# Patient Record
Sex: Male | Born: 1965
Health system: Southern US, Community
[De-identification: ages and names within clinical notes are randomized; demographics above are authoritative.]

## PROBLEM LIST (undated history)

## (undated) DIAGNOSIS — J45909 Unspecified asthma, uncomplicated: Secondary | ICD-10-CM

## (undated) DIAGNOSIS — F329 Major depressive disorder, single episode, unspecified: Secondary | ICD-10-CM

## (undated) DIAGNOSIS — T7840XA Allergy, unspecified, initial encounter: Secondary | ICD-10-CM

## (undated) DIAGNOSIS — Z8619 Personal history of other infectious and parasitic diseases: Secondary | ICD-10-CM

## (undated) DIAGNOSIS — D649 Anemia, unspecified: Secondary | ICD-10-CM

## (undated) DIAGNOSIS — F32A Depression, unspecified: Secondary | ICD-10-CM

## (undated) DIAGNOSIS — F419 Anxiety disorder, unspecified: Secondary | ICD-10-CM

## (undated) DIAGNOSIS — I10 Essential (primary) hypertension: Secondary | ICD-10-CM

## (undated) DIAGNOSIS — I639 Cerebral infarction, unspecified: Secondary | ICD-10-CM

## (undated) DIAGNOSIS — B1920 Unspecified viral hepatitis C without hepatic coma: Secondary | ICD-10-CM

## (undated) HISTORY — DX: Anemia, unspecified: D64.9

## (undated) HISTORY — DX: Major depressive disorder, single episode, unspecified: F32.9

## (undated) HISTORY — DX: Allergy, unspecified, initial encounter: T78.40XA

## (undated) HISTORY — DX: Depression, unspecified: F32.A

## (undated) HISTORY — DX: Unspecified viral hepatitis C without hepatic coma: B19.20

## (undated) HISTORY — DX: Cerebral infarction, unspecified: I63.9

## (undated) HISTORY — DX: Unspecified asthma, uncomplicated: J45.909

## (undated) HISTORY — DX: Essential (primary) hypertension: I10

## (undated) HISTORY — DX: Personal history of other infectious and parasitic diseases: Z86.19

## (undated) HISTORY — DX: Anxiety disorder, unspecified: F41.9

---

## 2000-11-06 ENCOUNTER — Emergency Department (HOSPITAL_COMMUNITY): Admission: EM | Admit: 2000-11-06 | Discharge: 2000-11-06 | Payer: Self-pay | Admitting: Emergency Medicine

## 2012-12-16 ENCOUNTER — Ambulatory Visit (INDEPENDENT_AMBULATORY_CARE_PROVIDER_SITE_OTHER): Payer: BC Managed Care – PPO | Admitting: Emergency Medicine

## 2012-12-16 ENCOUNTER — Ambulatory Visit: Payer: BC Managed Care – PPO

## 2012-12-16 VITALS — BP 118/80 | HR 82 | Temp 98.2°F | Resp 18 | Ht 68.25 in | Wt 184.0 lb

## 2012-12-16 DIAGNOSIS — M79645 Pain in left finger(s): Secondary | ICD-10-CM

## 2012-12-16 DIAGNOSIS — Z202 Contact with and (suspected) exposure to infections with a predominantly sexual mode of transmission: Secondary | ICD-10-CM

## 2012-12-16 DIAGNOSIS — M79609 Pain in unspecified limb: Secondary | ICD-10-CM

## 2012-12-16 DIAGNOSIS — A599 Trichomoniasis, unspecified: Secondary | ICD-10-CM

## 2012-12-16 DIAGNOSIS — R21 Rash and other nonspecific skin eruption: Secondary | ICD-10-CM

## 2012-12-16 DIAGNOSIS — Z2089 Contact with and (suspected) exposure to other communicable diseases: Secondary | ICD-10-CM

## 2012-12-16 LAB — HIV ANTIBODY (ROUTINE TESTING W REFLEX): HIV: NONREACTIVE

## 2012-12-16 LAB — HEPATITIS C ANTIBODY: HCV Ab: REACTIVE — AB

## 2012-12-16 MED ORDER — CLOTRIMAZOLE-BETAMETHASONE 1-0.05 % EX LOTN: TOPICAL_LOTION | Freq: Two times a day (BID) | CUTANEOUS | Status: DC

## 2012-12-16 MED ORDER — METRONIDAZOLE 500 MG PO TABS: ORAL_TABLET | ORAL | Status: DC

## 2012-12-16 NOTE — Progress Notes (Signed)
  Subjective:    Patient ID: Darren Pearson, male    DOB: 01-25-1966, 47 y.o.   MRN: [id number]  HPI Patient here to renew Prilosec. It has been effective. Pt states he also wants to have his left index finger looked at. He bent it while working out at [employer] about a month ago and it has not stopped hurting since. His motion is restricted. Pt is concerned about STD. He has an itch in his testicle area, minimal discharge. He was advised of Trichomonais from one of his sexual partners.     Review of Systems     Objective:   Physical Exam examination of the index finger left hand MCP joint reveals him to have only approximately 30 of flexion. There is swelling over the MCP joint.  Examination the genital area reveals no discharge be present there is some mild thickening of the skin over the scrotum but no lesions are seen. [facility] reading (PRIMARY) by  Dr.Thu Baggett there are degenerative changes present of the MCP joint of the second finger. There is some mild irregularity of the distal portion of the second medi carpal there is a distal amputation of the third finger.       Assessment & Plan:  We'll give the patient prescription for Flagyl 500 twice a day for 7 days for both he and his partner. Routine STD screening was done. Prescription for Lotrisone cream to apply to his genital area twice a day. Referral to made to the hand specialist regarding his index finger injury .

## 2012-12-17 LAB — GC/CHLAMYDIA PROBE AMP
CT Probe RNA: NEGATIVE
GC Probe RNA: NEGATIVE

## 2012-12-17 LAB — RPR

## 2012-12-18 ENCOUNTER — Telehealth: Payer: Self-pay | Admitting: Physician Assistant

## 2012-12-18 DIAGNOSIS — R768 Other specified abnormal immunological findings in serum: Secondary | ICD-10-CM

## 2012-12-18 NOTE — Telephone Encounter (Signed)
Message copied by Morrell Riddle on Sat Dec 18, 2012  2:30 PM ------      Message from: Johnnette Litter      Created: Fri Dec 17, 2012 11:51 AM       This is a new dx for this pt. Can one of you pls call this pt when you get a chance for Dr. Cleta Alberts. Thanks so much

## 2012-12-18 NOTE — Telephone Encounter (Signed)
Called patient. LMOM to return call about lab results.

## 2012-12-18 NOTE — Telephone Encounter (Signed)
Patient is returning our phone regarding lab results.    Best#: [phone]

## 2012-12-19 NOTE — Telephone Encounter (Signed)
Spoke with patient - answered questions.will do referral.

## 2012-12-23 ENCOUNTER — Other Ambulatory Visit: Payer: Self-pay | Admitting: Emergency Medicine

## 2013-02-03 ENCOUNTER — Other Ambulatory Visit: Payer: Self-pay | Admitting: Physician Assistant

## 2013-02-03 DIAGNOSIS — B182 Chronic viral hepatitis C: Secondary | ICD-10-CM

## 2013-02-09 ENCOUNTER — Ambulatory Visit
Admission: RE | Admit: 2013-02-09 | Discharge: 2013-02-09 | Disposition: A | Discharge status: Home / Self Care | Payer: BC Managed Care – PPO | Source: Ambulatory Visit | Attending: Physician Assistant | Admitting: Physician Assistant

## 2013-02-09 DIAGNOSIS — B182 Chronic viral hepatitis C: Secondary | ICD-10-CM

## 2013-04-12 ENCOUNTER — Telehealth: Payer: Self-pay

## 2013-04-12 ENCOUNTER — Ambulatory Visit (INDEPENDENT_AMBULATORY_CARE_PROVIDER_SITE_OTHER): Payer: BC Managed Care – PPO | Admitting: Internal Medicine

## 2013-04-12 ENCOUNTER — Other Ambulatory Visit (INDEPENDENT_AMBULATORY_CARE_PROVIDER_SITE_OTHER): Payer: BC Managed Care – PPO

## 2013-04-12 ENCOUNTER — Ambulatory Visit (INDEPENDENT_AMBULATORY_CARE_PROVIDER_SITE_OTHER)
Admission: RE | Admit: 2013-04-12 | Discharge: 2013-04-12 | Disposition: A | Discharge status: Home / Self Care | Payer: BC Managed Care – PPO | Source: Ambulatory Visit | Attending: Internal Medicine | Admitting: Internal Medicine

## 2013-04-12 ENCOUNTER — Encounter: Payer: Self-pay | Admitting: Internal Medicine

## 2013-04-12 ENCOUNTER — Encounter: Payer: Self-pay | Admitting: *Deleted

## 2013-04-12 VITALS — BP 148/92 | HR 67 | Temp 98.1°F | Ht 68.0 in | Wt 169.0 lb

## 2013-04-12 DIAGNOSIS — J45909 Unspecified asthma, uncomplicated: Secondary | ICD-10-CM

## 2013-04-12 DIAGNOSIS — B1920 Unspecified viral hepatitis C without hepatic coma: Secondary | ICD-10-CM

## 2013-04-12 DIAGNOSIS — R062 Wheezing: Secondary | ICD-10-CM

## 2013-04-12 DIAGNOSIS — Z130 Encounter for screening for diseases of the blood and blood-forming organs and certain disorders involving the immune mechanism: Secondary | ICD-10-CM

## 2013-04-12 DIAGNOSIS — Z131 Encounter for screening for diabetes mellitus: Secondary | ICD-10-CM

## 2013-04-12 DIAGNOSIS — Z13220 Encounter for screening for lipoid disorders: Secondary | ICD-10-CM

## 2013-04-12 DIAGNOSIS — J309 Allergic rhinitis, unspecified: Secondary | ICD-10-CM

## 2013-04-12 DIAGNOSIS — R05 Cough: Secondary | ICD-10-CM

## 2013-04-12 DIAGNOSIS — R059 Cough, unspecified: Secondary | ICD-10-CM

## 2013-04-12 DIAGNOSIS — Z0000 Encounter for general adult medical examination without abnormal findings: Secondary | ICD-10-CM

## 2013-04-12 DIAGNOSIS — R634 Abnormal weight loss: Secondary | ICD-10-CM

## 2013-04-12 LAB — BASIC METABOLIC PANEL
BUN: 8 mg/dL (ref 6–23)
CO2: 31 mEq/L (ref 19–32)
Calcium: 8.7 mg/dL (ref 8.4–10.5)
Chloride: 103 mEq/L (ref 96–112)
Creatinine, Ser: 0.8 mg/dL (ref 0.4–1.5)
GFR: 142.93 mL/min (ref >60.00)
Glucose, Bld: 81 mg/dL (ref 70–99)
Potassium: 3.9 mEq/L (ref 3.5–5.1)
Sodium: 140 mEq/L (ref 135–145)

## 2013-04-12 LAB — CBC
HCT: 44.4 % (ref 39.0–52.0)
Hemoglobin: 15.3 g/dL (ref 13.0–17.0)
MCHC: 34.4 g/dL (ref 30.0–36.0)
MCV: 96.9 fl (ref 78.0–100.0)
Platelets: 221 10*3/uL (ref 150.0–400.0)
RBC: 4.58 Mil/uL (ref 4.22–5.81)
RDW: 13.1 % (ref 11.5–14.6)
WBC: 4.4 10*3/uL — ABNORMAL LOW (ref 4.5–10.5)

## 2013-04-12 LAB — LIPID PANEL
Cholesterol: 164 mg/dL (ref 0–200)
HDL: 60.1 mg/dL (ref >39.00)
LDL Cholesterol: 92 mg/dL (ref 0–99)
Total CHOL/HDL Ratio: 3
Triglycerides: 60 mg/dL (ref 0.0–149.0)
VLDL: 12 mg/dL (ref 0.0–40.0)

## 2013-04-12 LAB — HEPATIC FUNCTION PANEL
ALT: 38 U/L (ref 0–53)
AST: 39 U/L — ABNORMAL HIGH (ref 0–37)
Albumin: 3.8 g/dL (ref 3.5–5.2)
Alkaline Phosphatase: 72 U/L (ref 39–117)
Bilirubin, Direct: 0.2 mg/dL (ref 0.0–0.3)
Total Bilirubin: 0.9 mg/dL (ref 0.3–1.2)
Total Protein: 7.7 g/dL (ref 6.0–8.3)

## 2013-04-12 LAB — TSH: TSH: 1.18 u[IU]/mL (ref 0.35–5.50)

## 2013-04-12 LAB — HEMOGLOBIN A1C: Hgb A1c MFr Bld: 4.5 % — ABNORMAL LOW (ref 4.6–6.5)

## 2013-04-12 MED ORDER — ALBUTEROL SULFATE HFA 108 (90 BASE) MCG/ACT IN AERS: 2.0000 | INHALATION_SPRAY | Freq: Four times a day (QID) | RESPIRATORY_TRACT | Status: DC | PRN

## 2013-04-12 MED ORDER — CETIRIZINE HCL 10 MG PO TABS: 10.0000 mg | ORAL_TABLET | Freq: Every day | ORAL | Status: DC

## 2013-04-12 NOTE — Patient Instructions (Signed)
Health Maintenance, Males A healthy lifestyle and preventative care can promote health and wellness.  Maintain regular health, dental, and eye exams.  Eat a healthy diet. Foods like vegetables, fruits, whole grains, low-fat dairy products, and lean protein foods contain the nutrients you need without too many calories. Decrease your intake of foods high in solid fats, added sugars, and salt. Get information about a proper diet from your caregiver, if necessary.  Regular physical exercise is one of the most important things you can do for your health. Most adults should get at least 150 minutes of moderate-intensity exercise (any activity that increases your heart rate and causes you to sweat) each week. In addition, most adults need muscle-strengthening exercises on 2 or more days a week.   Maintain a healthy weight. The body mass index (BMI) is a screening tool to identify possible weight problems. It provides an estimate of body fat based on height and weight. Your caregiver can help determine your BMI, and can help you achieve or maintain a healthy weight. For adults 20 years and older:  A BMI below 18.5 is considered underweight.  A BMI of 18.5 to 24.9 is normal.  A BMI of 25 to 29.9 is considered overweight.  A BMI of 30 and above is considered obese.  Maintain normal blood lipids and cholesterol by exercising and minimizing your intake of saturated fat. Eat a balanced diet with plenty of fruits and vegetables. Blood tests for lipids and cholesterol should begin at age 20 and be repeated every 5 years. If your lipid or cholesterol levels are high, you are over 50, or you are a high risk for heart disease, you may need your cholesterol levels checked more frequently.Ongoing high lipid and cholesterol levels should be treated with medicines, if diet and exercise are not effective.  If you smoke, find out from your caregiver how to quit. If you do not use tobacco, do not start.  If you  choose to drink alcohol, do not exceed 2 drinks per day. One drink is considered to be 12 ounces (355 mL) of beer, 5 ounces (148 mL) of wine, or 1.5 ounces (44 mL) of liquor.  Avoid use of street drugs. Do not share needles with anyone. Ask for help if you need support or instructions about stopping the use of drugs.  High blood pressure causes heart disease and increases the risk of stroke. Blood pressure should be checked at least every 1 to 2 years. Ongoing high blood pressure should be treated with medicines if weight loss and exercise are not effective.  If you are 45 to 48 years old, ask your caregiver if you should take aspirin to prevent heart disease.  Diabetes screening involves taking a blood sample to check your fasting blood sugar level. This should be done once every 3 years, after age 45, if you are within normal weight and without risk factors for diabetes. Testing should be considered at a younger age or be carried out more frequently if you are overweight and have at least 1 risk factor for diabetes.  Colorectal cancer can be detected and often prevented. Most routine colorectal cancer screening begins at the age of 50 and continues through age 75. However, your caregiver may recommend screening at an earlier age if you have risk factors for colon cancer. On a yearly basis, your caregiver may provide home test kits to check for hidden blood in the stool. Use of a small camera at the end of a tube,   to directly examine the colon (sigmoidoscopy or colonoscopy), can detect the earliest forms of colorectal cancer. Talk to your caregiver about this at age 50, when routine screening begins. Direct examination of the colon should be repeated every 5 to 10 years through age 75, unless early forms of pre-cancerous polyps or small growths are found.  Hepatitis C blood testing is recommended for all people born from 1945 through 1965 and any individual with known risks for hepatitis C.  Healthy  men should no longer receive prostate-specific antigen (PSA) blood tests as part of routine cancer screening. Consult with your caregiver about prostate cancer screening.  Testicular cancer screening is not recommended for adolescents or adult males who have no symptoms. Screening includes self-exam, caregiver exam, and other screening tests. Consult with your caregiver about any symptoms you have or any concerns you have about testicular cancer.  Practice safe sex. Use condoms and avoid high-risk sexual practices to reduce the spread of sexually transmitted infections (STIs).  Use sunscreen with a sun protection factor (SPF) of 30 or greater. Apply sunscreen liberally and repeatedly throughout the day. You should seek shade when your shadow is shorter than you. Protect yourself by wearing long sleeves, pants, a wide-brimmed hat, and sunglasses year round, whenever you are outdoors.  Notify your caregiver of new moles or changes in moles, especially if there is a change in shape or color. Also notify your caregiver if a mole is larger than the size of a pencil eraser.  A one-time screening for abdominal aortic aneurysm (AAA) and surgical repair of large AAAs by sound wave imaging (ultrasonography) is recommended for ages 65 to 75 years who are current or former smokers.  Stay current with your immunizations. Document Released: 05/08/2008 Document Revised: 02/02/2012 Document Reviewed: 04/07/2011 Exit[employer] Patient Information 2013 [employer].  

## 2013-04-12 NOTE — Progress Notes (Signed)
HPI  Pt presents to the clinic today to establish care. He does not have a PCP. He has been going to urgent care or ED as needed. He was recently diagnosed with Hep C from remote cocaine use. He is being evaluated at the liver clinic. He also recently had a bout of acute bronchitis. He feels like his symptoms are only slightly improved. He is still coughing of green mucous and wheezing. He does have a history of allergy and asthma. He is not currently on any antihistamine treatment. He does smoke THC on a daily basis. He does not smoke cigarettes. He has had sick contacts. His brother is currently hospitalized with pneumonia.  Flu:08/2012 Tetanus: 1997 Eye doctor: yearly Dentist: never  Past Medical History  Diagnosis Date  . Asthma   . Allergy   . Hypertension   . Hepatitis C   . History of chicken pox     Current Outpatient Prescriptions  Medication Sig Dispense Refill  . Cholecalciferol (VITAMIN D PO) Take 1 tablet by mouth daily.      Marland Kitchen omeprazole (PRILOSEC) 20 MG capsule Take 20 mg by mouth daily.       No current facility-administered medications for this visit.    Allergies  Allergen Reactions  . Sulfa Antibiotics Hives    Family History  Problem Relation Age of Onset  . Cancer Mother   . Diabetes Father   . Paget's disease of bone Father     History   Social History  . Marital Status: Married    Spouse Name: N/A    Number of Children: N/A  . Years of Education: 12   Occupational History  . [employer] lead    Social History Main Topics  . Smoking status: Never Smoker   . Smokeless tobacco: Not on file  . Alcohol Use: No  . Drug Use: 1.00 per week    Special: Cocaine  . Sexually Active: Yes    Birth Control/ Protection: Condom   Other Topics Concern  . Not on file   Social History Narrative   Regular exercise-yes    ROS:  Constitutional: Pt has lost 15 pounds in the last 15 months. Denies fever, malaise, fatigue, headache. HEENT: Pt  reports PND. Denies eye pain, eye redness, ear pain, ringing in the ears, wax buildup, runny nose, nasal congestion, bloody nose, or sore throat. Respiratory: Pt reports cough and wheezing. Denies difficulty breathing, shortness of breath.   Cardiovascular: Denies chest pain, chest tightness, palpitations or swelling in the hands or feet.  Gastrointestinal: Denies abdominal pain, bloating, constipation, diarrhea or blood in the stool.  GU: Denies frequency, urgency, pain with urination, blood in urine, odor or discharge. Musculoskeletal: Denies decrease in range of motion, difficulty with gait, muscle pain or joint pain and swelling.  Skin: Denies redness, rashes, lesions or ulcercations.  Neurological: Denies dizziness, difficulty with memory, difficulty with speech or problems with balance and coordination.   No other specific complaints in a complete review of systems (except as listed in HPI above).  PE:  BP 148/92  Pulse 67  Temp(Src) 98.1 F (36.7 C) (Oral)  Ht 5\' 8"  (1.727 m)  Wt 169 lb (76.658 kg)  BMI 25.7 kg/m2  SpO2 96% Wt Readings from Last 3 Encounters:  04/12/13 169 lb (76.658 kg)  12/16/12 184 lb (83.462 kg)    General: Appears his stated age, well developed, well nourished in NAD. HEENT: Head: normal shape and size; Eyes: sclera white, no icterus, conjunctiva  pink, PERRLA and EOMs intact; Ears: Tm's gray and intact, normal light reflex; Nose: mucosa boggy and moist, septum midline; Throat/Mouth: Teeth present, mucosa pink and moist, no lesions or ulcerations noted.  Neck: Normal range of motion. Neck supple, trachea midline. No massses, lumps or thyromegaly present.  Cardiovascular: Normal rate and rhythm. S1,S2 noted.  No murmur, rubs or gallops noted. No JVD or BLE edema. No carotid bruits noted. Pulmonary/Chest: Normal effort and bilateral wheezing noted. No respiratory distress. No wheezes, rales or ronchi noted.  Abdomen: Soft and nontender. Normal bowel sounds, no  bruits noted. No distention or masses noted. Liver, spleen and kidneys non palpable. Musculoskeletal: Normal range of motion. No signs of joint swelling. No difficulty with gait. Loss of pinky and ring finger on left hand secondary to accident. Neurological: Alert and oriented. Cranial nerves II-XII intact. Coordination normal. +DTRs bilaterally. Psychiatric: Mood and affect normal. Behavior is normal. Judgment and thought content normal.     Assessment and Plan:  Preventative Health Maintenance:  Encouraged pt to visit a dentist Tetanus booster declined today Encouraged pt to quit smoking THC, pt declines

## 2013-04-12 NOTE — Assessment & Plan Note (Signed)
eRx for zyrtec daily

## 2013-04-12 NOTE — Assessment & Plan Note (Signed)
Will check lft today Continue to follow with liver clinic Avoid alcohol

## 2013-04-12 NOTE — Telephone Encounter (Signed)
Phone call from pt requesting his chest xray results. I called him back and left message for him to return call.

## 2013-04-12 NOTE — Assessment & Plan Note (Signed)
Will obtain chest xray today to r/o pna Refilled albuterol

## 2013-04-13 ENCOUNTER — Telehealth: Payer: Self-pay

## 2013-04-13 NOTE — Telephone Encounter (Signed)
Phone call from patient again and gave him the results of his chest xray

## 2013-04-13 NOTE — Telephone Encounter (Signed)
Patient calls back and states he wants the results of his xray and a message can be left if he does not answer. I called pt back and left him a message with his chest xray results and he can call back if any questions.

## 2013-04-15 ENCOUNTER — Encounter: Payer: Self-pay | Admitting: *Deleted

## 2013-07-18 ENCOUNTER — Encounter: Payer: Self-pay | Admitting: Physician Assistant

## 2013-09-29 ENCOUNTER — Other Ambulatory Visit: Payer: Self-pay

## 2013-11-30 ENCOUNTER — Other Ambulatory Visit: Payer: Self-pay | Admitting: Internal Medicine

## 2013-11-30 DIAGNOSIS — C220 Liver cell carcinoma: Secondary | ICD-10-CM

## 2013-12-05 ENCOUNTER — Ambulatory Visit
Admission: RE | Admit: 2013-12-05 | Discharge: 2013-12-05 | Disposition: A | Discharge status: Home / Self Care | Payer: BC Managed Care – PPO | Source: Ambulatory Visit | Attending: Internal Medicine | Admitting: Internal Medicine

## 2013-12-05 DIAGNOSIS — C220 Liver cell carcinoma: Secondary | ICD-10-CM

## 2014-05-30 ENCOUNTER — Ambulatory Visit (INDEPENDENT_AMBULATORY_CARE_PROVIDER_SITE_OTHER): Payer: BC Managed Care – PPO | Admitting: Emergency Medicine

## 2014-05-30 ENCOUNTER — Ambulatory Visit: Payer: BC Managed Care – PPO

## 2014-05-30 VITALS — BP 154/82 | HR 67 | Temp 98.1°F | Resp 18 | Ht 67.75 in | Wt 142.0 lb

## 2014-05-30 DIAGNOSIS — R768 Other specified abnormal immunological findings in serum: Secondary | ICD-10-CM

## 2014-05-30 DIAGNOSIS — R894 Abnormal immunological findings in specimens from other organs, systems and tissues: Secondary | ICD-10-CM

## 2014-05-30 DIAGNOSIS — R0602 Shortness of breath: Secondary | ICD-10-CM

## 2014-05-30 DIAGNOSIS — R634 Abnormal weight loss: Secondary | ICD-10-CM

## 2014-05-30 LAB — POCT URINALYSIS DIPSTICK
Bilirubin, UA: NEGATIVE
Blood, UA: NEGATIVE
Glucose, UA: NEGATIVE
Leukocytes, UA: NEGATIVE
Nitrite, UA: NEGATIVE
Protein, UA: 30
Spec Grav, UA: 1.015
Urobilinogen, UA: >=8
pH, UA: 7

## 2014-05-30 LAB — POCT UA - MICROSCOPIC ONLY
Bacteria, U Microscopic: NEGATIVE
Casts, Ur, LPF, POC: NEGATIVE
Crystals, Ur, HPF, POC: NEGATIVE
Mucus, UA: NEGATIVE
RBC, urine, microscopic: NEGATIVE
WBC, Ur, HPF, POC: NEGATIVE
Yeast, UA: NEGATIVE

## 2014-05-30 LAB — POCT SEDIMENTATION RATE: POCT SED RATE: 28 mm/hr — AB (ref 0–22)

## 2014-05-30 LAB — GLUCOSE, POCT (MANUAL RESULT ENTRY): POC Glucose: 85 mg/dL (ref 70–99)

## 2014-05-30 NOTE — Progress Notes (Signed)
  Tuberculosis Risk Questionnaire  1. No Were you born outside the [place] in one of the following parts of the world: [place], [place], [place], [place] or [place]?    2. No Have you traveled outside the [place] and lived for more than one month in one of the following parts of the world: [place], [place], [place], [place] or [place]?    3. No Do you have a compromised immune system such as from any of the following conditions:HIV/AIDS, organ or bone marrow transplantation, diabetes, immunosuppressive medicines (e.g. Prednisone, Remicaide), leukemia, lymphoma, cancer of the head or neck, gastrectomy or jejunal bypass, end-stage renal disease (on dialysis), or silicosis?     4. No Have you ever or do you plan on working in: a residential care center, a health care facility, a jail or prison or homeless shelter?    5. No Have you ever: injected illegal drugs, used crack cocaine, lived in a homeless shelter  or been in jail or prison?     6. No Have you ever been exposed to anyone with infectious tuberculosis?    Tuberculosis Symptom Questionnaire  Do you currently have any of the following symptoms?  1. No Unexplained cough lasting more than 3 weeks?   2. No Unexplained fever lasting more than 3 weeks.   3. No Night Sweats (sweating that leaves the bedclothes and sheets wet)     4. No Shortness of Breath   5. No Chest Pain   6. Yes  Unintentional weight loss    7. No Unexplained fatigue (very tired for no reason)

## 2014-05-30 NOTE — Progress Notes (Addendum)
[facility] [address] [place] [address] [phone]  Date:  05/30/2014   Name:  Darren Pearson   DOB:  09-Aug-1966   MRN:  [id number]  PCP:  Webb Silversmith, NP    Chief Complaint: Shortness of Breath and Excessive Sweating   History of Present Illness:  Darren Pearson is a 48 y.o. very pleasant male patient who presents with the following:  Patient has a number of unrelated complaints. He says that he has been losing weight with over 30 pounds lost in 18 months.   Says his appetite is normal and denies nausea or vomiting. No stool change, blood in stools, black stools or hematemesis.  Denies hemoptysis.  No fever or chills.  History of Hep C.  No icterus.   Has been short of breath per patient.  No cough, wheezing.  Says shortness of breath is with exetrion.  No PDN or orthopnea Gives a history of increased sweating past few days while working.  Says he usually sweats at work when it is hot and this sweating was extreme.  No improvement with over the counter medications or other home remedies. Denies other complaint or health concern today.   Patient Active Problem List   Diagnosis Date Noted  . Hepatitis C 04/12/2013  . Allergic rhinitis 04/12/2013  . Unspecified asthma(493.90) 04/12/2013    Past Medical History  Diagnosis Date  . Asthma   . Allergy   . Hypertension   . Hepatitis C   . History of chicken pox     History reviewed. No pertinent past surgical history.  History  Substance Use Topics  . Smoking status: Current Some Day Smoker    Start date: 11/24/2012  . Smokeless tobacco: Not on file  . Alcohol Use: 1.2 oz/week    2 Cans of beer per week    Family History  Problem Relation Age of Onset  . Cancer Mother   . Diabetes Father   . Paget's disease of bone Father     Allergies  Allergen Reactions  . Sulfa Antibiotics Hives    Medication list has been reviewed and updated.  No current outpatient prescriptions on file  prior to visit.   No current facility-administered medications on file prior to visit.    Review of Systems:  As per HPI, otherwise negative.    Physical Examination: Filed Vitals:   05/30/14 1416  BP: 154/82  Pulse: 67  Temp: 98.1 F (36.7 C)  Resp: 18   Filed Vitals:   05/30/14 1416  Height: 5' 7.75" (1.721 m)  Weight: 142 lb (64.411 kg)   Body mass index is 21.75 kg/(m^2). Ideal Body Weight: Weight in (lb) to have BMI = 25: 162.9  GEN: WDWN, NAD, Non-toxic, A & O x 3 HEENT: Atraumatic, Normocephalic. Neck supple. No masses, No LAD. Ears and Nose: No external deformity. CV: RRR, No M/G/R. No JVD. No thrill. No extra heart sounds. PULM: CTA B, no wheezes, crackles, rhonchi. No retractions. No resp. distress. No accessory muscle use. ABD: S, NT, ND, +BS. No rebound. Liver is 3 fingers. EXTR: No c/c/e NEURO Normal gait.  PSYCH: Normally interactive. Conversant. Not depressed or anxious appearing.  Calm demeanor.    Assessment and Plan: Weight loss "sweating" Hepatitis C Labs pending   Signed,  Ellison Carwin, MD   [facility] reading (PRIMARY) by  Dr. Ouida Sills chest unchanged .  Results for orders placed in visit on 05/30/14  POCT UA - MICROSCOPIC  ONLY      Result Value Ref Range   WBC, Ur, HPF, POC neg     RBC, urine, microscopic neg     Bacteria, U Microscopic neg     Mucus, UA neg     Epithelial cells, urine per micros 0-2     Crystals, Ur, HPF, POC neg     Casts, Ur, LPF, POC neg     Yeast, UA neg    POCT URINALYSIS DIPSTICK      Result Value Ref Range   Color, UA yellow     Clarity, UA cloudy     Glucose, UA neg     Bilirubin, UA neg     Ketones, UA trace     Spec Grav, UA 1.015     Blood, UA neg     pH, UA 7.0     Protein, UA 30     Urobilinogen, UA >=8.0     Nitrite, UA neg     Leukocytes, UA Negative    GLUCOSE, POCT (MANUAL RESULT ENTRY)      Result Value Ref Range   POC Glucose 85  70 - 99 mg/dl   Results for orders placed in visit  on 05/30/14  CBC WITH DIFFERENTIAL      Result Value Ref Range   WBC 4.8  4.0 - 10.5 K/uL   RBC 4.72  4.22 - 5.81 MIL/uL   Hemoglobin 15.7  13.0 - 17.0 g/dL   HCT 44.8  39.0 - 52.0 %   MCV 94.9  78.0 - 100.0 fL   MCH 33.3  26.0 - 34.0 pg   MCHC 35.0  30.0 - 36.0 g/dL   RDW 12.9  11.5 - 15.5 %   Platelets 225  150 - 400 K/uL   Neutrophils Relative % 45  43 - 77 %   Neutro Abs 2.2  1.7 - 7.7 K/uL   Lymphocytes Relative 44  12 - 46 %   Lymphs Abs 2.1  0.7 - 4.0 K/uL   Monocytes Relative 8  3 - 12 %   Monocytes Absolute 0.4  0.1 - 1.0 K/uL   Eosinophils Relative 3  0 - 5 %   Eosinophils Absolute 0.1  0.0 - 0.7 K/uL   Basophils Relative 0  0 - 1 %   Basophils Absolute 0.0  0.0 - 0.1 K/uL   Smear Review Criteria for review not met    COMPREHENSIVE METABOLIC PANEL      Result Value Ref Range   Sodium 142  135 - 145 mEq/L   Potassium 3.2 (*) 3.5 - 5.3 mEq/L   Chloride 103  96 - 112 mEq/L   CO2 29  19 - 32 mEq/L   Glucose, Bld 73  70 - 99 mg/dL   BUN 9  6 - 23 mg/dL   Creat 0.91  0.50 - 1.35 mg/dL   Total Bilirubin 1.5 (*) 0.2 - 1.2 mg/dL   Alkaline Phosphatase 76  39 - 117 U/L   AST 38 (*) 0 - 37 U/L   ALT 31  0 - 53 U/L   Total Protein 7.3  6.0 - 8.3 g/dL   Albumin 3.9  3.5 - 5.2 g/dL   Calcium 9.0  8.4 - 10.5 mg/dL  TSH      Result Value Ref Range   TSH 0.712  0.350 - 4.500 uIU/mL  POCT SEDIMENTATION RATE      Result Value Ref Range   POCT SED RATE 28 (*)  0 - 22 mm/hr  POCT UA - MICROSCOPIC ONLY      Result Value Ref Range   WBC, Ur, HPF, POC neg     RBC, urine, microscopic neg     Bacteria, U Microscopic neg     Mucus, UA neg     Epithelial cells, urine per micros 0-2     Crystals, Ur, HPF, POC neg     Casts, Ur, LPF, POC neg     Yeast, UA neg    POCT URINALYSIS DIPSTICK      Result Value Ref Range   Color, UA yellow     Clarity, UA cloudy     Glucose, UA neg     Bilirubin, UA neg     Ketones, UA trace     Spec Grav, UA 1.015     Blood, UA neg     pH, UA 7.0      Protein, UA 30     Urobilinogen, UA >=8.0     Nitrite, UA neg     Leukocytes, UA Negative    GLUCOSE, POCT (MANUAL RESULT ENTRY)      Result Value Ref Range   POC Glucose 85  70 - 99 mg/dl

## 2014-05-31 LAB — COMPREHENSIVE METABOLIC PANEL
ALT: 31 U/L (ref 0–53)
AST: 38 U/L — ABNORMAL HIGH (ref 0–37)
Albumin: 3.9 g/dL (ref 3.5–5.2)
Alkaline Phosphatase: 76 U/L (ref 39–117)
BUN: 9 mg/dL (ref 6–23)
CO2: 29 mEq/L (ref 19–32)
Calcium: 9 mg/dL (ref 8.4–10.5)
Chloride: 103 mEq/L (ref 96–112)
Creat: 0.91 mg/dL (ref 0.50–1.35)
Glucose, Bld: 73 mg/dL (ref 70–99)
Potassium: 3.2 mEq/L — ABNORMAL LOW (ref 3.5–5.3)
Sodium: 142 mEq/L (ref 135–145)
Total Bilirubin: 1.5 mg/dL — ABNORMAL HIGH (ref 0.2–1.2)
Total Protein: 7.3 g/dL (ref 6.0–8.3)

## 2014-05-31 LAB — TSH: TSH: 0.712 u[IU]/mL (ref 0.350–4.500)

## 2014-05-31 LAB — CBC WITH DIFFERENTIAL/PLATELET
Basophils Absolute: 0 10*3/uL (ref 0.0–0.1)
Basophils Relative: 0 % (ref 0–1)
Eosinophils Absolute: 0.1 10*3/uL (ref 0.0–0.7)
Eosinophils Relative: 3 % (ref 0–5)
HCT: 44.8 % (ref 39.0–52.0)
Hemoglobin: 15.7 g/dL (ref 13.0–17.0)
Lymphocytes Relative: 44 % (ref 12–46)
Lymphs Abs: 2.1 10*3/uL (ref 0.7–4.0)
MCH: 33.3 pg (ref 26.0–34.0)
MCHC: 35 g/dL (ref 30.0–36.0)
MCV: 94.9 fL (ref 78.0–100.0)
Monocytes Absolute: 0.4 10*3/uL (ref 0.1–1.0)
Monocytes Relative: 8 % (ref 3–12)
Neutro Abs: 2.2 10*3/uL (ref 1.7–7.7)
Neutrophils Relative %: 45 % (ref 43–77)
Platelets: 225 10*3/uL (ref 150–400)
RBC: 4.72 MIL/uL (ref 4.22–5.81)
RDW: 12.9 % (ref 11.5–15.5)
WBC: 4.8 10*3/uL (ref 4.0–10.5)

## 2014-05-31 MED ORDER — POTASSIUM CHLORIDE ER 8 MEQ PO TBCR: 8.0000 meq | EXTENDED_RELEASE_TABLET | Freq: Every day | ORAL | Status: DC

## 2014-05-31 NOTE — Addendum Note (Signed)
Addended by: Roselee Culver on: 05/31/2014 10:18 AM   Modules accepted: Orders

## 2014-05-31 NOTE — Addendum Note (Signed)
Addended by: Roselee Culver on: 05/31/2014 10:16 AM   Modules accepted: Orders

## 2014-06-01 ENCOUNTER — Encounter: Payer: Self-pay | Admitting: Internal Medicine

## 2014-06-01 ENCOUNTER — Ambulatory Visit: Payer: BC Managed Care – PPO | Admitting: Internal Medicine

## 2014-06-02 ENCOUNTER — Encounter: Payer: BC Managed Care – PPO | Admitting: *Deleted

## 2014-06-02 LAB — TB SKIN TEST
Induration: 0 mm
TB Skin Test: NEGATIVE

## 2014-06-05 ENCOUNTER — Ambulatory Visit: Payer: BC Managed Care – PPO | Admitting: Internal Medicine

## 2014-06-05 ENCOUNTER — Ambulatory Visit: Payer: BC Managed Care – PPO | Admitting: Gastroenterology

## 2014-06-05 ENCOUNTER — Ambulatory Visit (INDEPENDENT_AMBULATORY_CARE_PROVIDER_SITE_OTHER): Payer: BC Managed Care – PPO | Admitting: Internal Medicine

## 2014-06-05 ENCOUNTER — Encounter: Payer: Self-pay | Admitting: Internal Medicine

## 2014-06-05 ENCOUNTER — Telehealth: Payer: Self-pay | Admitting: Gastroenterology

## 2014-06-05 VITALS — BP 134/70 | HR 91 | Temp 98.6°F | Wt 141.0 lb

## 2014-06-05 DIAGNOSIS — Z13220 Encounter for screening for lipoid disorders: Secondary | ICD-10-CM

## 2014-06-05 DIAGNOSIS — Z125 Encounter for screening for malignant neoplasm of prostate: Secondary | ICD-10-CM

## 2014-06-05 DIAGNOSIS — R634 Abnormal weight loss: Secondary | ICD-10-CM

## 2014-06-05 MED ORDER — MIRTAZAPINE 15 MG PO TABS: 15.0000 mg | ORAL_TABLET | Freq: Every day | ORAL | Status: DC

## 2014-06-05 NOTE — Progress Notes (Signed)
Subjective:    Patient ID: Darren Pearson, male    DOB: 1966-04-15, 48 y.o.   MRN: [id number]  HPI  Pt presents to the clinic today with c/o weight loss. He reports a 73 lbs weight loss in the last year. He was seen by UC 7/7 for the same. He had no change in appetite or bowels. He did c/o some SOB with exertion and excessive sweating. Labs showed low potassium, slightly elevated liver enzymes and elevated sed rate. He was advised at that time to take a potassium supplement and recheck labs in 1 month. PPD and HIV were also negative. Chest xray was normal. He was supposed to see GI 06/01/14 for EGD and Colonoscopy- he no showed. He also no showed for an appt with me 06/01/14. He has followed up with the Hep C clinic. They do not feel that his weight loss is due to Hep C at this time. He does report to me that he is very stressed out. He works from 5 pm to 2 am. He wakes up about 10 am, eats [place] bacon and 2 waffles. He will eat again about 8 pm. After eating his second meal of the day, he will not eat again until the next morning. He has recently cut back on his smoking. He does still smoke THC multiple times per week. He reports this does not increase his appetite at all. He has not started any new recreational drugs.   Review of Systems      Past Medical History  Diagnosis Date  . Asthma   . Allergy   . Hypertension   . Hepatitis C   . History of chicken pox     Current Outpatient Prescriptions  Medication Sig Dispense Refill  . potassium chloride (KLOR-CON) 8 MEQ tablet Take 1 tablet (8 mEq total) by mouth daily.  30 tablet  2   No current facility-administered medications for this visit.    Allergies  Allergen Reactions  . Sulfa Antibiotics Hives    Family History  Problem Relation Age of Onset  . Cancer Mother   . Diabetes Father   . Paget's disease of bone Father     History   Social History  . Marital Status: Single    Spouse Name: N/A    Number of Children: N/A    . Years of Education: 12   Occupational History  . Shipping Department lead    Social History Main Topics  . Smoking status: Current Some Day Smoker    Start date: 11/24/2012  . Smokeless tobacco: Not on file  . Alcohol Use: 1.2 oz/week    2 Cans of beer per week  . Drug Use: 1.00 per week    Special: Cocaine  . Sexual Activity: Yes    Birth Control/ Protection: Condom   Other Topics Concern  . Not on file   Social History Narrative   Regular exercise-yes     Constitutional: Pt reports weight loss. Denies fever, malaise, fatigue, headache.  HEENT: Denies eye pain, eye redness, ear pain, ringing in the ears, wax buildup, runny nose, nasal congestion, bloody nose, or sore throat. Respiratory: Denies difficulty breathing, shortness of breath, cough or sputum production.   Cardiovascular: Denies chest pain, chest tightness, palpitations or swelling in the hands or feet.  Gastrointestinal: Denies abdominal pain, bloating, constipation, diarrhea or blood in the stool.  GU: Denies urgency, frequency, pain with urination, burning sensation, blood in urine, odor or discharge. Musculoskeletal: Denies decrease  in range of motion, difficulty with gait, muscle pain or joint pain and swelling.  Skin: Denies redness, rashes, lesions or ulcercations.  Neurological: Denies dizziness, difficulty with memory, difficulty with speech or problems with balance and coordination.   No other specific complaints in a complete review of systems (except as listed in HPI above).  Objective:   Physical Exam   BP 134/70  Pulse 91  Temp(Src) 98.6 F (37 C) (Oral)  Wt 141 lb (63.957 kg)  SpO2 98% Wt Readings from Last 3 Encounters:  06/05/14 141 lb (63.957 kg)  05/30/14 142 lb (64.411 kg)  04/12/13 169 lb (76.658 kg)    General: Appears his stated age, well developed, well nourished in NAD. He is down 43 lbs in the last year per our record. Skin: Warm, dry and intact. No rashes, lesions or  ulcerations noted. HEENT: Head: normal shape and size; Eyes: sclera white, no icterus, conjunctiva pink, PERRLA and EOMs intact; Ears: Tm's gray and intact, normal light reflex; Nose: mucosa pink and moist, septum midline; Throat/Mouth: Teeth present, mucosa pink and moist, no exudate, lesions or ulcerations noted.  Neck: Normal range of motion. Neck supple, trachea midline. No massses, lumps or thyromegaly present.  Cardiovascular: Normal rate and rhythm. S1,S2 noted.  No murmur, rubs or gallops noted. No JVD or BLE edema. No carotid bruits noted. Pulmonary/Chest: Normal effort and positive vesicular breath sounds. No respiratory distress. No wheezes, rales or ronchi noted.  Abdomen: Soft and nontender. Normal bowel sounds, no bruits noted. No distention or masses noted. Liver, spleen and kidneys non palpable. DRE: External and internal hemorrhoids noted. Prostate enlarged but smooth. Musculoskeletal: Normal range of motion. No signs of joint swelling. No difficulty with gait.  Neurological: Alert and oriented. Cranial nerves II-XII intact. Coordination normal. +DTRs bilaterally. Psychiatric: Mood and affect normal. Behavior is normal. Judgment and thought content normal.    BMET    Component Value Date/Time   NA 142 05/30/2014 1529   K 3.2* 05/30/2014 1529   CL 103 05/30/2014 1529   CO2 29 05/30/2014 1529   GLUCOSE 73 05/30/2014 1529   BUN 9 05/30/2014 1529   CREATININE 0.91 05/30/2014 1529   CREATININE 0.8 04/12/2013 1120   CALCIUM 9.0 05/30/2014 1529    Lipid Panel     Component Value Date/Time   CHOL 164 04/12/2013 1120   TRIG 60.0 04/12/2013 1120   HDL 60.10 04/12/2013 1120   CHOLHDL 3 04/12/2013 1120   VLDL 12.0 04/12/2013 1120   LDLCALC 92 04/12/2013 1120    CBC    Component Value Date/Time   WBC 4.8 05/30/2014 1529   RBC 4.72 05/30/2014 1529   HGB 15.7 05/30/2014 1529   HCT 44.8 05/30/2014 1529   PLT 225 05/30/2014 1529   MCV 94.9 05/30/2014 1529   MCH 33.3 05/30/2014 1529   MCHC 35.0 05/30/2014  1529   RDW 12.9 05/30/2014 1529   LYMPHSABS 2.1 05/30/2014 1529   MONOABS 0.4 05/30/2014 1529   EOSABS 0.1 05/30/2014 1529   BASOSABS 0.0 05/30/2014 1529    Hgb A1C Lab Results  Component Value Date   HGBA1C 4.5* 04/12/2013        Assessment & Plan:   Weight loss:  Will check lipid profile, PSA (for HM) Will also check HIV, RPR, Lipase, amylase If labs normal, will consider CT chest, abdomen, pelvis Encouraged him to make sure he is eating at least 3 meals per day Will start Remeron to increase appetite Call GI and schedule  your colon screening Hemoccult negative  RTC in 1 month to reassess weight loss

## 2014-06-05 NOTE — Telephone Encounter (Signed)
Do you want to charge? 

## 2014-06-05 NOTE — Progress Notes (Signed)
Pre visit review using our clinic review tool, if applicable. No additional management support is needed unless otherwise documented below in the visit note. 

## 2014-06-05 NOTE — Telephone Encounter (Signed)
Yes charge 

## 2014-06-06 LAB — RPR

## 2014-06-06 LAB — LIPID PANEL
Cholesterol: 149 mg/dL (ref 0–200)
HDL: 78.8 mg/dL (ref >39.00)
LDL Cholesterol: 63 mg/dL (ref 0–99)
NonHDL: 70.2
Total CHOL/HDL Ratio: 2
Triglycerides: 38 mg/dL (ref 0.0–149.0)
VLDL: 7.6 mg/dL (ref 0.0–40.0)

## 2014-06-06 LAB — AMYLASE: Amylase: 83 U/L (ref 27–131)

## 2014-06-06 LAB — HIV ANTIBODY (ROUTINE TESTING W REFLEX): HIV 1&2 Ab, 4th Generation: NONREACTIVE

## 2014-06-06 LAB — PSA: PSA: 0.76 ng/mL (ref 0.10–4.00)

## 2014-06-06 LAB — LIPASE: Lipase: 35 U/L (ref 11.0–59.0)

## 2014-06-07 ENCOUNTER — Telehealth: Payer: Self-pay | Admitting: Internal Medicine

## 2014-06-07 NOTE — Telephone Encounter (Signed)
Pt called you back regarding lab results

## 2014-07-06 ENCOUNTER — Ambulatory Visit: Payer: BC Managed Care – PPO | Admitting: Internal Medicine

## 2014-07-10 ENCOUNTER — Encounter: Payer: Self-pay | Admitting: Internal Medicine

## 2014-07-10 ENCOUNTER — Ambulatory Visit (INDEPENDENT_AMBULATORY_CARE_PROVIDER_SITE_OTHER): Payer: BC Managed Care – PPO | Admitting: Internal Medicine

## 2014-07-10 VITALS — BP 138/86 | HR 75 | Temp 98.4°F | Wt 143.0 lb

## 2014-07-10 DIAGNOSIS — R634 Abnormal weight loss: Secondary | ICD-10-CM

## 2014-07-10 MED ORDER — MIRTAZAPINE 15 MG PO TABS: 15.0000 mg | ORAL_TABLET | Freq: Every day | ORAL | Status: DC

## 2014-07-10 NOTE — Progress Notes (Signed)
Subjective:    Patient ID: Darren Pearson, male    DOB: [birth year], 48 y.o.   MRN: [id number]  HPI  Pt presents to the clinic today for 1 month follow up of weight. He reports that he had lost about 70 lbs over the last year. At his last visit, I started him on Remeron. Since that time, his weight has increased by 2 lbs. He reports he has also noted a decrease in his overall anxiety level. He does need refills of the medication.  Review of Systems      Past Medical History  Diagnosis Date  . Asthma   . Allergy   . Hypertension   . Hepatitis C   . History of chicken pox     Current Outpatient Prescriptions  Medication Sig Dispense Refill  . mirtazapine (REMERON) 15 MG tablet Take 1 tablet (15 mg total) by mouth at bedtime.  30 tablet  2  . potassium chloride (KLOR-CON) 8 MEQ tablet Take 1 tablet (8 mEq total) by mouth daily.  30 tablet  2   No current facility-administered medications for this visit.    Allergies  Allergen Reactions  . Sulfa Antibiotics Hives    Family History  Problem Relation Age of Onset  . Cancer Mother   . Diabetes Father   . Paget's disease of bone Father     History   Social History  . Marital Status: Single    Spouse Name: N/A    Number of Children: N/A  . Years of Education: 12   Occupational History  . Shipping Department lead    Social History Main Topics  . Smoking status: Current Some Day Smoker -- 0.25 packs/day    Types: Cigarettes    Start date: 11/24/2012  . Smokeless tobacco: Not on file  . Alcohol Use: 1.2 oz/week    2 Cans of beer per week     Comment: social  . Drug Use: 1.00 per week    Special: Cocaine  . Sexual Activity: Yes    Birth Control/ Protection: Condom   Other Topics Concern  . Not on file   Social History Narrative   Regular exercise-yes     Constitutional: Pt reports weight loss. Denies fever, malaise, fatigue, headache.  Psych: Pt reports anxiety. Denies depression, SI/HI.  No other  specific complaints in a complete review of systems (except as listed in HPI above).  Objective:   Physical Exam   BP 138/86  Pulse 75  Temp(Src) 98.4 F (36.9 C) (Oral)  Wt 143 lb (64.864 kg)  SpO2 98% Wt Readings from Last 3 Encounters:  07/10/14 143 lb (64.864 kg)  06/05/14 141 lb (63.957 kg)  05/30/14 142 lb (64.411 kg)    General: Appears his stated age, well developed, well nourished in NAD. Cardiovascular: Normal rate and rhythm. S1,S2 noted.  No murmur, rubs or gallops noted. No JVD or BLE edema. No carotid bruits noted. Pulmonary/Chest: Normal effort and positive vesicular breath sounds. No respiratory distress. No wheezes, rales or ronchi noted.  Abdomen: Soft and nontender. Normal bowel sounds, no bruits noted. No distention or masses noted. Liver, spleen and kidneys non palpable.   BMET    Component Value Date/Time   NA 142 05/30/2014 1529   K 3.2* 05/30/2014 1529   CL 103 05/30/2014 1529   CO2 29 05/30/2014 1529   GLUCOSE 73 05/30/2014 1529   BUN 9 05/30/2014 1529   CREATININE 0.91 05/30/2014 1529   CREATININE 0.8 04/12/2013  1120   CALCIUM 9.0 05/30/2014 1529    Lipid Panel     Component Value Date/Time   CHOL 149 06/05/2014 1329   TRIG 38.0 06/05/2014 1329   HDL 78.80 06/05/2014 1329   CHOLHDL 2 06/05/2014 1329   VLDL 7.6 06/05/2014 1329   LDLCALC 63 06/05/2014 1329    CBC    Component Value Date/Time   WBC 4.8 05/30/2014 1529   RBC 4.72 05/30/2014 1529   HGB 15.7 05/30/2014 1529   HCT 44.8 05/30/2014 1529   PLT 225 05/30/2014 1529   MCV 94.9 05/30/2014 1529   MCH 33.3 05/30/2014 1529   MCHC 35.0 05/30/2014 1529   RDW 12.9 05/30/2014 1529   LYMPHSABS 2.1 05/30/2014 1529   MONOABS 0.4 05/30/2014 1529   EOSABS 0.1 05/30/2014 1529   BASOSABS 0.0 05/30/2014 1529    Hgb A1C Lab Results  Component Value Date   HGBA1C 4.5* 04/12/2013        Assessment & Plan:   Weight loss:  Improved with remeron Will continue for now, refilled x 6 months  RTC in 6 months to reassess

## 2014-07-10 NOTE — Progress Notes (Signed)
Pre visit review using our clinic review tool, if applicable. No additional management support is needed unless otherwise documented below in the visit note. 

## 2014-07-10 NOTE — Patient Instructions (Addendum)
High Protein, High Calorie Diet A high protein, high calorie diet increases the amount of protein and calories you eat. You may need more protein and calories in your diet because of illness, surgery, injury, weight loss, or having a poor appetite. Eating high protein and high calorie foods can help you gain weight, heal, and recover after illness.  SERVING SIZES Measuring foods and serving sizes helps to make sure you are getting the right amount of food. The list below tells how big or small some common serving sizes are.   1 oz.........4 stacked dice.  3 oz.........Deck of cards.  1 tsp........Tip of little finger.  1 tbs........Thumb.  2 tbs........Golf ball.   cup.......Half of a fist.  1 cup........A fist. HIGH PROTEIN FOODS Dairy  Whole milk.  Whole milk yogurt.  Powdered milk.  Cheese.  Cottage Cheese.  Instant breakfast products.  Eggnog. Tips for adding to your diet:  Use whole milk when making hot cereal, puddings, soups, and hot cocoa.  Add powdered milk to baked goods, smoothies, and milkshakes.  Make whole milk yogurt parfaits by adding granola, fruit, or nuts.  Add cheese to sandwiches, pastas, soups, and casseroles.  Add fruit to cottage cheese. Meat   Beef, pork, and poultry.  Fish and seafood.  Peanut butter.  Dried beans.  Eggs. Tips for adding to your diet:  Make meat and cheese omelets.  Add eggs to salads and baked goods.  Add fish and seafood to salads.  Add meat and poultry to casseroles, salads, and soups.  Use peanut butter as a topping for pretzels, celery, crackers, or add it to baked goods.  Use beans in casseroles, dips, and spreads. GENERAL GUIDELINES TO INCREASE CALORIES  Replace calorie-free drinks with calorie-containing drinks, such as milk, fruit juices, regular soda, milkshakes, and hot chocolate.  Try to eat 6 small meals instead of 3 large meals each day.  Keep snacks handy, such as nuts, trail mixes,  dried fruit, and yogurt.  Choose foods with sauces and gravies.  Add dried fruits, honey, and half-and-half to hot or cold cereal.  Add extra fats when possible, such as butter, sour cream, cream cheese, and salad dressings.  Add cheese to foods often.  Consider adding a clear liquid nutritional supplement to your diet. Your caregiver can give you recommendations. HIGH CALORIE FOODS Grain/Starch  Baked goods, such as muffins and quick breads.  Croissants.  Pancakes and waffles. Vegetable   Sauted vegetables in oil.  Fried vegetables.  Salad greens with regular salad dressing or vinegar and oil. Fruit  Dried fruit.  Canned fruit in syrup.  Fruit juice. Fat  Avocado.  Butter or margarine.  Whipped cream.  Mayonnaise.  Salad dressing.  Peanuts and mixed nuts.  Cream cheese and sour cream. Sweets and Dessert  Cake.  Cookies.  Pie.  Ice cream.  Doughnuts and pastries.  Protein and meal replacement bars.  Jam, preserves, and jelly.  Candy bars.  Chocolate.  Chocolate, caramel, or other flavored syrups. Document Released: 11/10/2005 Document Revised: 02/02/2012 Document Reviewed: 08/13/2007 ExitCare Patient Information 2015 [employer]. This information is not intended to replace advice given to you by your health care provider. Make sure you discuss any questions you have with your health care provider.  

## 2014-08-10 ENCOUNTER — Ambulatory Visit: Payer: BC Managed Care – PPO | Admitting: Gastroenterology

## 2014-09-01 ENCOUNTER — Other Ambulatory Visit (HOSPITAL_COMMUNITY): Payer: Self-pay | Admitting: Nurse Practitioner

## 2014-09-01 DIAGNOSIS — B182 Chronic viral hepatitis C: Secondary | ICD-10-CM

## 2014-09-08 ENCOUNTER — Other Ambulatory Visit: Payer: Self-pay

## 2014-11-07 ENCOUNTER — Encounter: Payer: Self-pay | Admitting: Internal Medicine

## 2014-11-08 ENCOUNTER — Encounter: Payer: Self-pay | Admitting: Internal Medicine

## 2014-11-14 ENCOUNTER — Ambulatory Visit: Payer: BC Managed Care – PPO | Admitting: Internal Medicine

## 2014-11-15 ENCOUNTER — Ambulatory Visit: Payer: BC Managed Care – PPO | Admitting: Internal Medicine

## 2014-12-01 ENCOUNTER — Encounter: Payer: Self-pay | Admitting: Internal Medicine

## 2014-12-02 ENCOUNTER — Encounter: Payer: Self-pay | Admitting: Internal Medicine

## 2014-12-03 ENCOUNTER — Encounter: Payer: Self-pay | Admitting: Internal Medicine

## 2014-12-04 ENCOUNTER — Encounter: Payer: Self-pay | Admitting: Internal Medicine

## 2014-12-04 ENCOUNTER — Telehealth: Payer: Self-pay | Admitting: Internal Medicine

## 2014-12-04 NOTE — Telephone Encounter (Signed)
Pt brought in FMLA forms to be filled out and faxed by 1/13. Will put inbox.

## 2014-12-04 NOTE — Telephone Encounter (Signed)
placed in your inbox

## 2014-12-05 ENCOUNTER — Ambulatory Visit: Payer: Self-pay | Admitting: Internal Medicine

## 2014-12-05 ENCOUNTER — Encounter: Payer: Self-pay | Admitting: Internal Medicine

## 2014-12-05 DIAGNOSIS — Z7689 Persons encountering health services in other specified circumstances: Secondary | ICD-10-CM

## 2014-12-05 NOTE — Telephone Encounter (Signed)
Forms in your inbox.   Pt has only been seen 3 times by you. On 04/12/13 is the only visit w/ dx code of hep c. No visits with dx code of fatigue. Flagged questions for your review. Question # 1 asks if we referred to other health care provider--I wasn't sure if you referred him to Hepatology?  Please review, complete and sign. Please return to [place] after completed.

## 2014-12-05 NOTE — Telephone Encounter (Signed)
Done, given to Ebony Hail to fax to  [employer] [place] fax [phone]

## 2014-12-06 NOTE — Telephone Encounter (Signed)
Faxed paperwork to Bev Giles at [facility]  Phone: [phone] Fax: [phone]  Pt aware and will pick up copy from front desk.

## 2015-01-08 ENCOUNTER — Encounter: Payer: Self-pay | Admitting: Internal Medicine

## 2015-01-11 ENCOUNTER — Ambulatory Visit (HOSPITAL_COMMUNITY)
Admission: RE | Admit: 2015-01-11 | Discharge: 2015-01-11 | Disposition: A | Discharge status: Home / Self Care | Payer: BLUE CROSS/BLUE SHIELD | Source: Ambulatory Visit | Attending: Nurse Practitioner | Admitting: Nurse Practitioner

## 2015-01-11 DIAGNOSIS — B182 Chronic viral hepatitis C: Secondary | ICD-10-CM | POA: Diagnosis not present

## 2015-02-02 NOTE — Telephone Encounter (Signed)
Note faxed to number given by pt in mychart msg

## 2015-02-04 ENCOUNTER — Emergency Department (HOSPITAL_COMMUNITY)
Admission: EM | Admit: 2015-02-04 | Discharge: 2015-02-05 | Disposition: A | Discharge status: Home / Self Care | Payer: BLUE CROSS/BLUE SHIELD | Attending: Emergency Medicine | Admitting: Emergency Medicine

## 2015-02-04 ENCOUNTER — Encounter (HOSPITAL_COMMUNITY): Payer: Self-pay | Admitting: *Deleted

## 2015-02-04 DIAGNOSIS — F329 Major depressive disorder, single episode, unspecified: Secondary | ICD-10-CM | POA: Diagnosis not present

## 2015-02-04 DIAGNOSIS — J45909 Unspecified asthma, uncomplicated: Secondary | ICD-10-CM | POA: Diagnosis not present

## 2015-02-04 DIAGNOSIS — Z720 Tobacco use: Secondary | ICD-10-CM | POA: Diagnosis not present

## 2015-02-04 DIAGNOSIS — Z8619 Personal history of other infectious and parasitic diseases: Secondary | ICD-10-CM | POA: Insufficient documentation

## 2015-02-04 DIAGNOSIS — I10 Essential (primary) hypertension: Secondary | ICD-10-CM | POA: Diagnosis not present

## 2015-02-04 DIAGNOSIS — R112 Nausea with vomiting, unspecified: Secondary | ICD-10-CM | POA: Diagnosis present

## 2015-02-04 DIAGNOSIS — F419 Anxiety disorder, unspecified: Secondary | ICD-10-CM | POA: Diagnosis not present

## 2015-02-04 LAB — COMPREHENSIVE METABOLIC PANEL
ALT: 22 U/L (ref 0–53)
AST: 34 U/L (ref 0–37)
Albumin: 4.1 g/dL (ref 3.5–5.2)
Alkaline Phosphatase: 85 U/L (ref 39–117)
Anion gap: 7 (ref 5–15)
BUN: 14 mg/dL (ref 6–23)
CO2: 32 mmol/L (ref 19–32)
Calcium: 9.3 mg/dL (ref 8.4–10.5)
Chloride: 103 mmol/L (ref 96–112)
Creatinine, Ser: 1.02 mg/dL (ref 0.50–1.35)
GFR calc Af Amer: >90 mL/min (ref >90)
GFR calc non Af Amer: 85 mL/min — ABNORMAL LOW (ref >90)
Glucose, Bld: 79 mg/dL (ref 70–99)
Potassium: 3.9 mmol/L (ref 3.5–5.1)
Sodium: 142 mmol/L (ref 135–145)
Total Bilirubin: 1.1 mg/dL (ref 0.3–1.2)
Total Protein: 7.8 g/dL (ref 6.0–8.3)

## 2015-02-04 LAB — LIPASE, BLOOD: Lipase: 32 U/L (ref 11–59)

## 2015-02-04 LAB — CBC WITH DIFFERENTIAL/PLATELET
Basophils Absolute: 0 10*3/uL (ref 0.0–0.1)
Basophils Relative: 0 % (ref 0–1)
Eosinophils Absolute: 0.2 10*3/uL (ref 0.0–0.7)
Eosinophils Relative: 3 % (ref 0–5)
HCT: 42.2 % (ref 39.0–52.0)
Hemoglobin: 14.6 g/dL (ref 13.0–17.0)
Lymphocytes Relative: 59 % — ABNORMAL HIGH (ref 12–46)
Lymphs Abs: 3.1 10*3/uL (ref 0.7–4.0)
MCH: 33 pg (ref 26.0–34.0)
MCHC: 34.6 g/dL (ref 30.0–36.0)
MCV: 95.5 fL (ref 78.0–100.0)
Monocytes Absolute: 0.4 10*3/uL (ref 0.1–1.0)
Monocytes Relative: 7 % (ref 3–12)
Neutro Abs: 1.6 10*3/uL — ABNORMAL LOW (ref 1.7–7.7)
Neutrophils Relative %: 31 % — ABNORMAL LOW (ref 43–77)
Platelets: 193 10*3/uL (ref 150–400)
RBC: 4.42 MIL/uL (ref 4.22–5.81)
RDW: 12.3 % (ref 11.5–15.5)
WBC: 5.3 10*3/uL (ref 4.0–10.5)

## 2015-02-04 MED ORDER — ONDANSETRON 4 MG PO TBDP
4.0000 mg | ORAL_TABLET | Freq: Once | ORAL | Status: AC
Start: 2015-02-04 — End: 2015-02-04
  Administered 2015-02-04: 4 mg via ORAL
  Filled 2015-02-04: qty 1

## 2015-02-04 MED ORDER — ONDANSETRON 8 MG PO TBDP: 8.0000 mg | ORAL_TABLET | Freq: Once | ORAL | Status: DC

## 2015-02-04 MED ORDER — ONDANSETRON 4 MG PO TBDP: 4.0000 mg | ORAL_TABLET | Freq: Three times a day (TID) | ORAL | Status: DC | PRN

## 2015-02-04 NOTE — Discharge Instructions (Signed)

## 2015-02-04 NOTE — ED Notes (Signed)
Pt complains of emesis for the past 5 days. Pt states the vomiting began when he started taking a medication for hepatitis C. Pt states he feels weak and is unable to keep anything down.

## 2015-02-04 NOTE — ED Notes (Signed)
Awake. Verbally responsive. A/O x4. Resp even and unlabored. No audible adventitious breath sounds noted. ABC's intact.  

## 2015-02-04 NOTE — ED Provider Notes (Signed)
CSN: [id number]     Arrival date & time 02/04/15  1707 History   First MD Initiated Contact with Patient 02/04/15 2246     Chief Complaint  Patient presents with  . Emesis   Darren Pearson is a 49 y.o. male with a history of hep C and recently started on Harvoni who presents to the emergency department complaining of nausea and vomiting ongoing for the past 3 days. He reports starting Harvoni approximately 4 days ago and 3 days ago started having nausea and vomiting. He reports his nausea and vomiting has slowly improved and today he has only vomited once. He also reports eating chips out in the waiting room and he kept this down. He currently denies any nausea. He denies having any abdominal pain. He denies drinking alcohol. He denies sick contacts. He denies relation of his symptoms with eating. The patient denies fevers, chills, hematemesis, hematochezia, diarrhea, dysuria, hematuria, or abdominal pain.   (Consider location/radiation/quality/duration/timing/severity/associated sxs/prior Treatment) HPI  Past Medical History  Diagnosis Date  . Asthma   . Allergy   . Hypertension   . Hepatitis C   . History of chicken pox   . Anxiety and depression    History reviewed. No pertinent past surgical history. Family History  Problem Relation Age of Onset  . Stomach cancer Mother   . Diabetes Father   . Paget's disease of bone Father    History  Substance Use Topics  . Smoking status: Current Some Day Smoker -- 0.25 packs/day    Types: Cigarettes    Start date: 11/24/2012  . Smokeless tobacco: Not on file  . Alcohol Use: 1.2 oz/week    2 Cans of beer per week     Comment: social    Review of Systems  Constitutional: Negative for fever and chills.  HENT: Negative for congestion and sore throat.   Eyes: Negative for visual disturbance.  Respiratory: Negative for cough, shortness of breath and wheezing.   Cardiovascular: Negative for chest pain and palpitations.   Gastrointestinal: Positive for nausea and vomiting. Negative for abdominal pain and diarrhea.  Genitourinary: Negative for dysuria, urgency, frequency, hematuria, flank pain and difficulty urinating.  Musculoskeletal: Negative for back pain and neck pain.  Skin: Negative for rash.  Neurological: Negative for light-headedness and headaches.      Allergies  Sulfa antibiotics  Home Medications   Prior to Admission medications   Medication Sig Start Date End Date Taking? Authorizing Provider  cholecalciferol (VITAMIN D) 1000 UNITS tablet Take 1,000 Units by mouth daily.   Yes Historical Provider, MD  hydroxypropyl methylcellulose / hypromellose (ISOPTO TEARS / GONIOVISC) 2.5 % ophthalmic solution Place 1 drop into both eyes 3 (three) times daily as needed for dry eyes.   Yes Historical Provider, MD  Ledipasvir-Sofosbuvir 90-400 MG TABS Take 1 tablet by mouth daily.   Yes Historical Provider, MD  mirtazapine (REMERON) 15 MG tablet Take 1 tablet (15 mg total) by mouth at bedtime. Patient not taking: Reported on 02/04/2015 07/10/14   Jearld Fenton, NP  ondansetron (ZOFRAN ODT) 4 MG disintegrating tablet Take 1 tablet (4 mg total) by mouth every 8 (eight) hours as needed for nausea or vomiting. 02/04/15   Waynetta Pean, PA-C  potassium chloride (KLOR-CON) 8 MEQ tablet Take 1 tablet (8 mEq total) by mouth daily. Patient not taking: Reported on 02/04/2015 05/31/14   Roselee Culver, MD   BP 169/88 mmHg  Pulse 65  Temp(Src) 98.5 F (36.9 C) (Oral)  Resp 16  SpO2 100% Physical Exam  Constitutional: He appears well-developed and well-nourished. No distress.  Nontoxic appearing. Well appearing.   HENT:  Head: Normocephalic and atraumatic.  Mouth/Throat: Oropharynx is clear and moist.  Eyes: Conjunctivae are normal. Pupils are equal, round, and reactive to light. Right eye exhibits no discharge. Left eye exhibits no discharge.  Neck: Neck supple.  Cardiovascular: Normal rate, regular rhythm,  normal heart sounds and intact distal pulses.  Exam reveals no gallop and no friction rub.   No murmur heard. Pulmonary/Chest: Effort normal and breath sounds normal. No respiratory distress. He has no wheezes. He has no rales.  Abdominal: Soft. Bowel sounds are normal. He exhibits no distension and no mass. There is no tenderness. There is no rebound and no guarding.  Abdomen is soft and nontender to palpation. Bowel sounds are present. No McBurney's point tenderness. Negative Murphy's sign. Negative Rovsing sign. Negative psoas and obturator sign.  Musculoskeletal: He exhibits no edema.  Lymphadenopathy:    He has no cervical adenopathy.  Neurological: He is alert. Coordination normal.  Skin: Skin is warm and dry. No rash noted. He is not diaphoretic. No erythema. No pallor.  Psychiatric: He has a normal mood and affect. His behavior is normal.  Nursing note and vitals reviewed.   ED Course  Procedures (including critical care time) Labs Review Labs Reviewed  CBC WITH DIFFERENTIAL/PLATELET - Abnormal; Notable for the following:    Neutrophils Relative % 31 (*)    Neutro Abs 1.6 (*)    Lymphocytes Relative 59 (*)    All other components within normal limits  COMPREHENSIVE METABOLIC PANEL - Abnormal; Notable for the following:    GFR calc non Af Amer 85 (*)    All other components within normal limits  LIPASE, BLOOD    Imaging Review No results found.   EKG Interpretation None      Filed Vitals:   02/04/15 1732 02/04/15 2235  BP: 165/97 169/88  Pulse: 85 65  Temp: 98.9 F (37.2 C) 98.5 F (36.9 C)  TempSrc: Oral Oral  Resp: 16 16  SpO2: 98% 100%     MDM   Meds given in ED:  Medications  ondansetron (ZOFRAN-ODT) disintegrating tablet 4 mg (not administered)    New Prescriptions   ONDANSETRON (ZOFRAN ODT) 4 MG DISINTEGRATING TABLET    Take 1 tablet (4 mg total) by mouth every 8 (eight) hours as needed for nausea or vomiting.    Final diagnoses:   Non-intractable vomiting with nausea, vomiting of unspecified type    This is a 49 y.o. male with a history of hep C and recently started on Harvoni who presents to the emergency department complaining of nausea and vomiting ongoing for the past 3 days. He reports starting Harvoni approximately 4 days ago and 3 days ago started having nausea and vomiting. He reports his nausea and vomiting has slowly improved and today he has only vomited once. Today he has tolerated chips and water without vomiting. He reports feeling better. He denies any current nausea. He reports he thinks this symptoms are improving. His CBC, CMP and lipase are unremarkable. Will discharge with zofran and have him follow up with his hepatologist this week. I advised the patient to follow-up with their primary care provider this week. I advised the patient to return to the emergency department with new or worsening symptoms or new concerns. The patient verbalized understanding and agreement with plan.   This patient was discussed with Dr.  Pfeiffer who agrees with assessment and plan.     Waynetta Pean, PA-C 02/04/15 2346  Charlesetta Shanks, MD 02/10/15 [phone]

## 2015-02-05 NOTE — ED Notes (Signed)
Awake. Verbally responsive. Resp even and unlabored. No audible adventitious breath sounds noted. ABC's intact. Abd soft/nondistended/nontender to palpate. BS (+) and active x4 quadrants. No N/V/D reported.

## 2015-03-07 ENCOUNTER — Encounter: Payer: Self-pay | Admitting: Internal Medicine

## 2015-03-12 ENCOUNTER — Telehealth: Payer: Self-pay | Admitting: Internal Medicine

## 2015-03-12 ENCOUNTER — Encounter: Payer: BLUE CROSS/BLUE SHIELD | Admitting: Internal Medicine

## 2015-03-12 DIAGNOSIS — Z0289 Encounter for other administrative examinations: Secondary | ICD-10-CM

## 2015-03-12 NOTE — Telephone Encounter (Signed)
Patient did not come in for their appointment today for blood pressure .  Please let me know if patient needs to be contacted immediately for follow up or no follow up needed.

## 2015-03-12 NOTE — Progress Notes (Signed)
No show

## 2015-03-13 NOTE — Telephone Encounter (Signed)
scheuled for thurs 4/21.

## 2015-03-13 NOTE — Telephone Encounter (Signed)
Yes we need to address his blood pressure

## 2015-03-15 ENCOUNTER — Ambulatory Visit: Payer: BLUE CROSS/BLUE SHIELD | Admitting: Internal Medicine

## 2015-03-15 ENCOUNTER — Ambulatory Visit (INDEPENDENT_AMBULATORY_CARE_PROVIDER_SITE_OTHER): Payer: BLUE CROSS/BLUE SHIELD | Admitting: Family Medicine

## 2015-03-15 ENCOUNTER — Telehealth: Payer: Self-pay | Admitting: Internal Medicine

## 2015-03-15 ENCOUNTER — Encounter: Payer: Self-pay | Admitting: Family Medicine

## 2015-03-15 VITALS — BP 143/90 | HR 69 | Temp 97.7°F | Wt 140.5 lb

## 2015-03-15 DIAGNOSIS — IMO0001 Reserved for inherently not codable concepts without codable children: Secondary | ICD-10-CM | POA: Insufficient documentation

## 2015-03-15 DIAGNOSIS — R030 Elevated blood-pressure reading, without diagnosis of hypertension: Secondary | ICD-10-CM | POA: Diagnosis not present

## 2015-03-15 DIAGNOSIS — R21 Rash and other nonspecific skin eruption: Secondary | ICD-10-CM

## 2015-03-15 DIAGNOSIS — Z0289 Encounter for other administrative examinations: Secondary | ICD-10-CM

## 2015-03-15 MED ORDER — CLOTRIMAZOLE-BETAMETHASONE 1-0.05 % EX LOTN: TOPICAL_LOTION | Freq: Two times a day (BID) | CUTANEOUS | Status: DC

## 2015-03-15 NOTE — Progress Notes (Signed)
Pre visit review using our clinic review tool, if applicable. No additional management support is needed unless otherwise documented below in the visit note. 

## 2015-03-15 NOTE — Assessment & Plan Note (Signed)
New- ? Tinea Treat with Lotrisone, if no improvement, needs to be biopsied to rule out malignancy/inflammatory process. The patient indicates understanding of these issues and agrees with the plan.

## 2015-03-15 NOTE — Assessment & Plan Note (Signed)
New- reassurance provided as BP is within his usual range here and he is asymptomatic even after rushing to get here.  No rx started today.  Advised to follow up BP with PCP along with other concerns, less pertinent, that we did not address today. The patient indicates understanding of these issues and agrees with the plan.

## 2015-03-15 NOTE — Progress Notes (Signed)
Subjective:   Patient ID: Darren Pearson, male    DOB: March 04, 1966, 49 y.o.   MRN: [id number]  Darren Pearson is a pleasant 49 y.o. year old male pt of [facility], new to me, who presents to clinic today with Follow-up  on 03/15/2015  HPI:  Missed his appointment with Rollene Fare so he was placed on my schedule.  Has several issues but we agreed to discuss two most important to him.  1.  ? HTN- has Hep C, now on Harvoni.  At hep c clinic, BP was elevated 160s/100s so he felt he should be seen here today. Does not take any antihypertensives. No CP, HA, SOB, blurred vision or LE edema.  BP Readings from Last 3 Encounters:  03/15/15 143/90  02/04/15 159/87  07/10/14 138/86    2.  Rash on his scrotum- very itchy.  Had something like this in past, not sure what was called in for him.  No new soaps or detergents.  Current Outpatient Prescriptions on File Prior to Visit  Medication Sig Dispense Refill  . cholecalciferol (VITAMIN D) 1000 UNITS tablet Take 1,000 Units by mouth daily.    . hydroxypropyl methylcellulose / hypromellose (ISOPTO TEARS / GONIOVISC) 2.5 % ophthalmic solution Place 1 drop into both eyes 3 (three) times daily as needed for dry eyes.    . Ledipasvir-Sofosbuvir 90-400 MG TABS Take 1 tablet by mouth daily.    . mirtazapine (REMERON) 15 MG tablet Take 1 tablet (15 mg total) by mouth at bedtime. 30 tablet 5  . ondansetron (ZOFRAN ODT) 4 MG disintegrating tablet Take 1 tablet (4 mg total) by mouth every 8 (eight) hours as needed for nausea or vomiting. 10 tablet 0   No current facility-administered medications on file prior to visit.    Allergies  Allergen Reactions  . Sulfa Antibiotics Hives    Past Medical History  Diagnosis Date  . Asthma   . Allergy   . Hypertension   . Hepatitis C   . History of chicken pox   . Anxiety and depression     History reviewed. No pertinent past surgical history.  Family History  Problem Relation Age of Onset  . Stomach  cancer Mother   . Diabetes Father   . Paget's disease of bone Father     History   Social History  . Marital Status: Single    Spouse Name: N/A  . Number of Children: N/A  . Years of Education: 12   Occupational History  . [employer] lead    Social History Main Topics  . Smoking status: Current Some Day Smoker -- 0.25 packs/day    Types: Cigarettes    Start date: 11/24/2012  . Smokeless tobacco: Not on file  . Alcohol Use: 1.2 oz/week    2 Cans of beer per week     Comment: social  . Drug Use: 5.00 per week    Special: Marijuana  . Sexual Activity: Yes    Birth Control/ Protection: Condom   Other Topics Concern  . Not on file   Social History Narrative   Regular exercise-yes   The PMH, PSH, Social History, Family History, Medications, and allergies have been reviewed in [facility], and have been updated if relevant.   Review of Systems  HENT: Negative.   Respiratory: Negative.   Cardiovascular: Negative.   Gastrointestinal: Negative.   Musculoskeletal: Negative.   Skin: Positive for rash.  Neurological: Negative.   Hematological: Negative.   Psychiatric/Behavioral: Negative.  All other systems reviewed and are negative.      Objective:    BP 143/90 mmHg  Pulse 69  Temp(Src) 97.7 F (36.5 C) (Oral)  Wt 140 lb 8 oz (63.73 kg)  SpO2 97% Wt Readings from Last 3 Encounters:  03/15/15 140 lb 8 oz (63.73 kg)  07/10/14 143 lb (64.864 kg)  06/05/14 141 lb (63.957 kg)     Physical Exam  Constitutional: He is oriented to person, place, and time. He appears well-developed and well-nourished. No distress.  HENT:  Head: Normocephalic.  Eyes: EOM are normal.  Neck: Normal range of motion.  Cardiovascular: Normal rate.   Pulmonary/Chest: Effort normal.  Musculoskeletal: Normal range of motion.  Neurological: He is alert and oriented to person, place, and time. No cranial nerve deficit.  Skin: Rash noted.     Psychiatric: He has a normal mood and  affect. His behavior is normal. Judgment and thought content normal.  Nursing note and vitals reviewed.         Assessment & Plan:   Elevated blood pressure  Rash - Plan: clotrimazole-betamethasone (LOTRISONE) lotion No Follow-up on file.

## 2015-03-15 NOTE — Telephone Encounter (Signed)
Yes, he needs to follow up and he needs to be on time for his appts, not late which is a chronic issue for him

## 2015-03-15 NOTE — Telephone Encounter (Signed)
Patient did not come in for their appointment today for follow up.  Please let me know if patient needs to be contacted immediately for follow up or no follow up needed. °

## 2015-03-22 ENCOUNTER — Encounter: Payer: Self-pay | Admitting: Internal Medicine

## 2015-04-04 NOTE — Telephone Encounter (Signed)
Note for being tardy has been faxed

## 2015-04-05 ENCOUNTER — Encounter: Payer: Self-pay | Admitting: Internal Medicine

## 2015-04-06 ENCOUNTER — Encounter: Payer: Self-pay | Admitting: *Deleted

## 2015-04-10 NOTE — Telephone Encounter (Signed)
Work note has been faxed

## 2015-11-02 ENCOUNTER — Encounter (HOSPITAL_COMMUNITY): Payer: Self-pay | Admitting: Emergency Medicine

## 2015-11-02 ENCOUNTER — Emergency Department (HOSPITAL_COMMUNITY): Payer: 59

## 2015-11-02 ENCOUNTER — Emergency Department (HOSPITAL_COMMUNITY)
Admission: EM | Admit: 2015-11-02 | Discharge: 2015-11-02 | Disposition: A | Discharge status: Home / Self Care | Payer: 59 | Attending: Emergency Medicine | Admitting: Emergency Medicine

## 2015-11-02 DIAGNOSIS — Z8659 Personal history of other mental and behavioral disorders: Secondary | ICD-10-CM | POA: Insufficient documentation

## 2015-11-02 DIAGNOSIS — I10 Essential (primary) hypertension: Secondary | ICD-10-CM | POA: Diagnosis not present

## 2015-11-02 DIAGNOSIS — F17210 Nicotine dependence, cigarettes, uncomplicated: Secondary | ICD-10-CM | POA: Diagnosis not present

## 2015-11-02 DIAGNOSIS — N50811 Right testicular pain: Secondary | ICD-10-CM | POA: Diagnosis present

## 2015-11-02 DIAGNOSIS — Z8619 Personal history of other infectious and parasitic diseases: Secondary | ICD-10-CM | POA: Diagnosis not present

## 2015-11-02 DIAGNOSIS — Z79899 Other long term (current) drug therapy: Secondary | ICD-10-CM | POA: Insufficient documentation

## 2015-11-02 DIAGNOSIS — N451 Epididymitis: Secondary | ICD-10-CM | POA: Diagnosis not present

## 2015-11-02 DIAGNOSIS — J45909 Unspecified asthma, uncomplicated: Secondary | ICD-10-CM | POA: Insufficient documentation

## 2015-11-02 LAB — URINALYSIS, ROUTINE W REFLEX MICROSCOPIC
Bilirubin Urine: NEGATIVE
Glucose, UA: NEGATIVE mg/dL
Ketones, ur: NEGATIVE mg/dL
Nitrite: NEGATIVE
Protein, ur: 30 mg/dL — AB
Specific Gravity, Urine: 1.014 (ref 1.005–1.030)
pH: 8.5 — ABNORMAL HIGH (ref 5.0–8.0)

## 2015-11-02 LAB — GC/CHLAMYDIA PROBE AMP (~~LOC~~) NOT AT ARMC
Chlamydia: NEGATIVE
Neisseria Gonorrhea: NEGATIVE

## 2015-11-02 LAB — URINE MICROSCOPIC-ADD ON: Squamous Epithelial / LPF: NONE SEEN

## 2015-11-02 MED ORDER — LEVOFLOXACIN 500 MG PO TABS
500.0000 mg | ORAL_TABLET | Freq: Once | ORAL | Status: AC
  Administered 2015-11-02: 500 mg via ORAL
  Filled 2015-11-02 (×2): qty 1

## 2015-11-02 MED ORDER — CEFTRIAXONE SODIUM 250 MG IJ SOLR: 250.0000 mg | Freq: Once | INTRAMUSCULAR | Status: DC

## 2015-11-02 MED ORDER — HYDROMORPHONE HCL 1 MG/ML IJ SOLN
2.0000 mg | Freq: Once | INTRAMUSCULAR | Status: AC
  Administered 2015-11-02: 2 mg via INTRAMUSCULAR
  Filled 2015-11-02: qty 2

## 2015-11-02 MED ORDER — LEVOFLOXACIN 500 MG PO TABS: 500.0000 mg | ORAL_TABLET | Freq: Every day | ORAL | Status: DC

## 2015-11-02 NOTE — Discharge Instructions (Signed)
Epididymitis Darren Pearson, your ultrasound shows an infection in your testicles. Take antibiotics for 10 days for treatment. See a primary care physician within 3 days for close follow-up. If any symptoms worsen, come back to the emergency department immediately. Thank you. Epididymitis is swelling (inflammation) of the epididymis. The epididymis is a cord-like structure that is located along the top and back part of the testicle. It collects and stores sperm from the testicle. This condition can also cause pain and swelling of the testicle and scrotum. Symptoms usually start suddenly (acute epididymitis). Sometimes epididymitis starts gradually and lasts for a while (chronic epididymitis). This type may be harder to treat. CAUSES In men 10 and younger, this condition is usually caused by a bacterial infection or sexually transmitted disease (STD), such as:  Gonorrhea.  Chlamydia.  In men 35 and older who do not have anal sex, this condition is usually caused by bacteria from a blockage or abnormalities in the urinary system. These can result from:  Having a tube placed into the bladder (urinary catheter).  Having an enlarged or inflamed prostate gland.  Having recent urinary tract surgery. In men who have a condition that weakens the body's defense system (immune system), such as HIV, this condition can be caused by:   Other bacteria, including tuberculosis and syphilis.  Viruses.  Fungi. Sometimes this condition occurs without infection. That may happen if urine flows backward into the epididymis after heavy lifting or straining. RISK FACTORS This condition is more likely to develop in men:  Who have unprotected sex with more than one partner.  Who have anal sex.   Who have recently had surgery.   Who have a urinary catheter.  Who have urinary problems.  Who have a suppressed immune system. SYMPTOMS  This condition usually begins suddenly with chills, fever, and pain  behind the scrotum and in the testicle. Other symptoms include:   Swelling of the scrotum, testicle, or both.  Pain whenejaculatingor urinating.  Pain in the back or belly.  Nausea.  Itching and discharge from the penis.  Frequent need to pass urine.  Redness and tenderness of the scrotum. DIAGNOSIS Your health care provider can diagnose this condition based on your symptoms and medical history. Your health care provider will also do a physical exam to ask about your symptoms and check your scrotum and testicle for swelling, pain, and redness. You may also have other tests, including:   Examination of discharge from the penis.  Urine tests for infections, such as STDs.  Your health care provider may test you for other STDs, including HIV. TREATMENT Treatment for this condition depends on the cause. If your condition is caused by a bacterial infection, oral antibiotic medicine may be prescribed. If the bacterial infection has spread to your blood, you may need to receive IV antibiotics. Nonbacterial epididymitis is treated with home care that includes bed rest and elevation of the scrotum. Surgery may be needed to treat:  Bacterial epididymitis that causes pus to build up in the scrotum (abscess).  Chronic epididymitis that has not responded to other treatments. HOME CARE INSTRUCTIONS Medicines  Take over-the-counter and prescription medicines only as told by your health care provider.   If you were prescribed an antibiotic medicine, take it as told by your health care provider. Do not stop taking the antibiotic even if your condition improves. Sexual Activity  If your epididymitis was caused by an STD, avoid sexual activity until your treatment is complete.  Inform your sexual  partner or partners if you test positive for an STD. They may need to be treated.Do not engage in sexual activity with your partner or partners until their treatment is completed. General  Instructions  Return to your normal activities as told by your health care provider. Ask your health care provider what activities are safe for you.  Keep your scrotum elevated and supported while resting. Ask your health care provider if you should wear a scrotal support, such as a jockstrap. Wear it as told by your health care provider.  If directed, apply ice to the affected area:   Put ice in a plastic bag.  Place a towel between your skin and the bag.  Leave the ice on for 20 minutes, 2-3 times per day.  Try taking a sitz bath to help with discomfort. This is a warm water bath that is taken while you are sitting down. The water should only come up to your hips and should cover your buttocks. Do this 3-4 times per day or as told by your health care provider.  Keep all follow-up visits as told by your health care provider. This is important. SEEK MEDICAL CARE IF:   You have a fever.   Your pain medicine is not helping.   Your pain is getting worse.   Your symptoms do not improve within three days.   This information is not intended to replace advice given to you by your health care provider. Make sure you discuss any questions you have with your health care provider.   Document Released: 11/07/2000 Document Revised: 08/01/2015 Document Reviewed: 03/28/2015 Elsevier Interactive Patient Education [employer].

## 2015-11-02 NOTE — ED Provider Notes (Addendum)
CSN: [id number]     Arrival date & time 11/02/15  0501 History   First MD Initiated Contact with Patient 11/02/15 [phone]     Chief Complaint  Patient presents with  . Testicle Pain     (Consider location/radiation/quality/duration/timing/severity/associated sxs/prior Treatment) HPI  Darren Pearson is a 49 y.o. male with no second past medical history presenting today with sudden onset right testicular pain. Patient states this began suddenly around 77 PM last night while he was on the toilet. He had associated nausea and vomiting along with the pain. He states this happened to him once before however he never sought care. The pain came on suddenly. He continued to having pain now. He denies any swelling to the area. He's had no discharge from his penis. He denies any unprotected sex with new partners. He has no further complaints.  10 Systems reviewed and are negative for acute change except as noted in the HPI.     Past Medical History  Diagnosis Date  . Asthma   . Allergy   . Hypertension   . Hepatitis C   . History of chicken pox   . Anxiety and depression    History reviewed. No pertinent past surgical history. Family History  Problem Relation Age of Onset  . Stomach cancer Mother   . Diabetes Father   . Paget's disease of bone Father    Social History  Substance Use Topics  . Smoking status: Current Some Day Smoker -- 0.25 packs/day    Types: Cigarettes    Start date: 11/24/2012  . Smokeless tobacco: None  . Alcohol Use: 1.2 oz/week    2 Cans of beer per week     Comment: social    Review of Systems    Allergies  Sulfa antibiotics  Home Medications   Prior to Admission medications   Medication Sig Start Date End Date Taking? Authorizing Provider  acetaminophen (TYLENOL) 500 MG tablet Take 1,000 mg by mouth every 6 (six) hours as needed for mild pain.   Yes Historical Provider, MD  cholecalciferol (VITAMIN D) 1000 UNITS tablet Take 1,000 Units by mouth  daily.   Yes Historical Provider, MD  hydroxypropyl methylcellulose / hypromellose (ISOPTO TEARS / GONIOVISC) 2.5 % ophthalmic solution Place 1 drop into both eyes 3 (three) times daily as needed for dry eyes.   Yes Historical Provider, MD  ondansetron (ZOFRAN ODT) 4 MG disintegrating tablet Take 1 tablet (4 mg total) by mouth every 8 (eight) hours as needed for nausea or vomiting. 02/04/15  Yes Waynetta Pean, PA-C   BP 166/112 mmHg  Pulse 80  Temp(Src) 98.3 F (36.8 C) (Oral)  Resp 12  Ht 5\' 10"  (1.778 m)  Wt 150 lb (68.04 kg)  BMI 21.52 kg/m2  SpO2 98% Physical Exam  Constitutional: He is oriented to person, place, and time. Vital signs are normal. He appears well-developed and well-nourished.  Non-toxic appearance. He does not appear ill. He appears distressed.  HENT:  Head: Normocephalic and atraumatic.  Nose: Nose normal.  Mouth/Throat: Oropharynx is clear and moist. No oropharyngeal exudate.  Eyes: Conjunctivae and EOM are normal. Pupils are equal, round, and reactive to light. No scleral icterus.  Neck: Normal range of motion. Neck supple. No tracheal deviation, no edema, no erythema and normal range of motion present. No thyroid mass and no thyromegaly present.  Cardiovascular: Normal rate, regular rhythm, S1 normal, S2 normal, normal heart sounds, intact distal pulses and normal pulses.  Exam reveals no gallop  and no friction rub.   No murmur heard. Pulmonary/Chest: Effort normal and breath sounds normal. No respiratory distress. He has no wheezes. He has no rhonchi. He has no rales.  Abdominal: Soft. Normal appearance and bowel sounds are normal. He exhibits no distension, no ascites and no mass. There is no hepatosplenomegaly. There is no tenderness. There is no rebound, no guarding and no CVA tenderness.  Genitourinary: Penis normal.  Severe tenderness to palpation of the right testicle. Pain was made worse with rotating the testicle for torsion in either direction.   Musculoskeletal: Normal range of motion. He exhibits no edema or tenderness.  Lymphadenopathy:    He has no cervical adenopathy.  Neurological: He is alert and oriented to person, place, and time. He has normal strength. No cranial nerve deficit or sensory deficit.  Skin: Skin is warm, dry and intact. No petechiae and no rash noted. He is not diaphoretic. No erythema. No pallor.  Psychiatric: He has a normal mood and affect. His behavior is normal. Judgment normal.  Nursing note and vitals reviewed.   ED Course  Procedures (including critical care time) Labs Review Labs Reviewed  URINALYSIS, ROUTINE W REFLEX MICROSCOPIC (NOT AT [facility])  GC/CHLAMYDIA PROBE AMP ([place]) NOT AT [facility]    Imaging Review US Scrotum  11/02/2015  CLINICAL DATA:  49 year old male with right testicular pain. EXAM: SCROTAL ULTRASOUND DOPPLER ULTRASOUND OF THE TESTICLES TECHNIQUE: Complete ultrasound examination of the testicles, epididymis, and other scrotal structures was performed. Color and spectral Doppler ultrasound were also utilized to evaluate blood flow to the testicles. COMPARISON:  None. FINDINGS: Right testicle Measurements: 3.9 x 2.4 x 3.0 cm. No mass or microlithiasis visualized. Left testicle Measurements: 4.1 x 2.2 x 2.1 cm. No mass or microlithiasis visualized. Right epididymis: The right epididymis is enlarged, heterogeneous, and hyperemic. Left epididymis:  Normal in size and appearance. Hydrocele:  Small right hydrocele noted. Varicocele: There is prominence of the right venous plexus measuring up to 7 mm on Valsalva maneuver. Pulsed Doppler interrogation of both testes demonstrates normal low resistance arterial and venous waveforms bilaterally. IMPRESSION: Unremarkable testicles. Bilateral testicular Doppler flow identified. Mildly enlarged, heterogeneous, and hyperemic right epididymis concerning for epididymitis. Clinical correlation is recommended. Right-sided varicocele. Although this finding  may be reactive hyperemia to underlying infectious process, Isolated right-sided varicocele raise concern for possible retroperitoneal mass or renal vein thrombosis. Clinical correlation is recommended. Further evaluation with repeat ultrasound following treatment of the underlying infection or follow-up with CT of the abdomen pelvis with contrast recommended for evaluation of the upper abdomen and retroperitoneum. Urology consult is advised. Electronically Signed   By: Anner Crete M.D.   On: 11/02/2015 06:02   [place] Art/ven Flow Abd Pelv Doppler  11/02/2015  CLINICAL DATA:  49 year old male with right testicular pain. EXAM: SCROTAL ULTRASOUND DOPPLER ULTRASOUND OF THE TESTICLES TECHNIQUE: Complete ultrasound examination of the testicles, epididymis, and other scrotal structures was performed. Color and spectral Doppler ultrasound were also utilized to evaluate blood flow to the testicles. COMPARISON:  None. FINDINGS: Right testicle Measurements: 3.9 x 2.4 x 3.0 cm. No mass or microlithiasis visualized. Left testicle Measurements: 4.1 x 2.2 x 2.1 cm. No mass or microlithiasis visualized. Right epididymis: The right epididymis is enlarged, heterogeneous, and hyperemic. Left epididymis:  Normal in size and appearance. Hydrocele:  Small right hydrocele noted. Varicocele: There is prominence of the right venous plexus measuring up to 7 mm on Valsalva maneuver. Pulsed Doppler interrogation of both testes demonstrates normal low resistance  arterial and venous waveforms bilaterally. IMPRESSION: Unremarkable testicles. Bilateral testicular Doppler flow identified. Mildly enlarged, heterogeneous, and hyperemic right epididymis concerning for epididymitis. Clinical correlation is recommended. Right-sided varicocele. Although this finding may be reactive hyperemia to underlying infectious process, Isolated right-sided varicocele raise concern for possible retroperitoneal mass or renal vein thrombosis. Clinical correlation  is recommended. Further evaluation with repeat ultrasound following treatment of the underlying infection or follow-up with CT of the abdomen pelvis with contrast recommended for evaluation of the upper abdomen and retroperitoneum. Urology consult is advised. Electronically Signed   By: Anner Crete M.D.   On: 11/02/2015 06:02   I have personally reviewed and evaluated these images and lab results as part of my medical decision-making.   EKG Interpretation None      MDM   Final diagnoses:  Testicular pain, right   patient presents to emergency department for sudden onset right testicular pain. Out of concern for possible torsion. Ultrasound however shows right sided epididymitis. Because of a varicocele, there is possibility of retroperitoneal mass versus renal vein occlusion noted in the radiology read. CT scan was suggested for further evaluation. I do not believe patient is having any symptoms consistent with a retroperitoneal mass or renal vein occlusion. This appears to be an isolated right testicular pain. Patient has no risk factors for STD, thus he was treated with levofloxacin for 10 days. Primary care follow-up was advised. He appears well and in no acute distress, vital signs remain within his normal limits and he is safe for discharge.      Everlene Balls, MD 11/02/15 [phone]

## 2015-11-02 NOTE — ED Notes (Signed)
Patient transported to Ultrasound 

## 2015-11-02 NOTE — ED Notes (Signed)
Pt states he experienced an acute onset of right testicle pain around 2300 last night. Pt with N/V along with the pain.

## 2016-05-14 ENCOUNTER — Emergency Department (HOSPITAL_COMMUNITY): Payer: 59

## 2016-05-14 ENCOUNTER — Encounter (HOSPITAL_COMMUNITY): Payer: Self-pay | Admitting: *Deleted

## 2016-05-14 ENCOUNTER — Emergency Department (HOSPITAL_COMMUNITY)
Admission: EM | Admit: 2016-05-14 | Discharge: 2016-05-14 | Disposition: A | Discharge status: Home / Self Care | Payer: 59 | Attending: Emergency Medicine | Admitting: Emergency Medicine

## 2016-05-14 DIAGNOSIS — J45909 Unspecified asthma, uncomplicated: Secondary | ICD-10-CM | POA: Diagnosis not present

## 2016-05-14 DIAGNOSIS — B9689 Other specified bacterial agents as the cause of diseases classified elsewhere: Secondary | ICD-10-CM | POA: Diagnosis not present

## 2016-05-14 DIAGNOSIS — F17210 Nicotine dependence, cigarettes, uncomplicated: Secondary | ICD-10-CM | POA: Diagnosis not present

## 2016-05-14 DIAGNOSIS — N492 Inflammatory disorders of scrotum: Secondary | ICD-10-CM | POA: Diagnosis not present

## 2016-05-14 DIAGNOSIS — I10 Essential (primary) hypertension: Secondary | ICD-10-CM | POA: Insufficient documentation

## 2016-05-14 DIAGNOSIS — Z79899 Other long term (current) drug therapy: Secondary | ICD-10-CM | POA: Diagnosis not present

## 2016-05-14 DIAGNOSIS — N454 Abscess of epididymis or testis: Secondary | ICD-10-CM | POA: Diagnosis present

## 2016-05-14 LAB — BASIC METABOLIC PANEL
Anion gap: 7 (ref 5–15)
BUN: 10 mg/dL (ref 6–20)
CO2: 27 mmol/L (ref 22–32)
Calcium: 9 mg/dL (ref 8.9–10.3)
Chloride: 107 mmol/L (ref 101–111)
Creatinine, Ser: 0.83 mg/dL (ref 0.61–1.24)
GFR calc Af Amer: >60 mL/min (ref >60)
GFR calc non Af Amer: >60 mL/min (ref >60)
Glucose, Bld: 91 mg/dL (ref 65–99)
Potassium: 3.3 mmol/L — ABNORMAL LOW (ref 3.5–5.1)
Sodium: 141 mmol/L (ref 135–145)

## 2016-05-14 LAB — CBC WITH DIFFERENTIAL/PLATELET
Basophils Absolute: 0 10*3/uL (ref 0.0–0.1)
Basophils Relative: 0 %
Eosinophils Absolute: 0.2 10*3/uL (ref 0.0–0.7)
Eosinophils Relative: 3 %
HCT: 36.4 % — ABNORMAL LOW (ref 39.0–52.0)
Hemoglobin: 12.5 g/dL — ABNORMAL LOW (ref 13.0–17.0)
Lymphocytes Relative: 36 %
Lymphs Abs: 2.4 10*3/uL (ref 0.7–4.0)
MCH: 32.5 pg (ref 26.0–34.0)
MCHC: 34.3 g/dL (ref 30.0–36.0)
MCV: 94.5 fL (ref 78.0–100.0)
Monocytes Absolute: 0.5 10*3/uL (ref 0.1–1.0)
Monocytes Relative: 7 %
Neutro Abs: 3.7 10*3/uL (ref 1.7–7.7)
Neutrophils Relative %: 54 %
Platelets: 186 10*3/uL (ref 150–400)
RBC: 3.85 MIL/uL — ABNORMAL LOW (ref 4.22–5.81)
RDW: 13.1 % (ref 11.5–15.5)
WBC: 6.8 10*3/uL (ref 4.0–10.5)

## 2016-05-14 LAB — HIV ANTIBODY (ROUTINE TESTING W REFLEX): HIV Screen 4th Generation wRfx: NONREACTIVE

## 2016-05-14 LAB — RPR: RPR Ser Ql: NONREACTIVE

## 2016-05-14 MED ORDER — IOPAMIDOL (ISOVUE-300) INJECTION 61%
INTRAVENOUS | Status: AC
  Administered 2016-05-14: 100 mL
  Filled 2016-05-14: qty 100

## 2016-05-14 MED ORDER — LIDOCAINE HCL (PF) 1 % IJ SOLN
30.0000 mL | Freq: Once | INTRAMUSCULAR | Status: AC
Start: 2016-05-14 — End: 2016-05-14
  Administered 2016-05-14: 30 mL
  Filled 2016-05-14: qty 30

## 2016-05-14 MED ORDER — DOXYCYCLINE HYCLATE 100 MG PO CAPS: 100.0000 mg | ORAL_CAPSULE | Freq: Two times a day (BID) | ORAL | Status: DC

## 2016-05-14 MED ORDER — OXYCODONE-ACETAMINOPHEN 5-325 MG PO TABS
1.0000 | ORAL_TABLET | ORAL | Status: DC | PRN
Start: 2016-05-14 — End: 2019-11-23

## 2016-05-14 MED ORDER — DOXYCYCLINE HYCLATE 100 MG PO TABS
100.0000 mg | ORAL_TABLET | Freq: Once | ORAL | Status: AC
  Administered 2016-05-14: 100 mg via ORAL
  Filled 2016-05-14: qty 1

## 2016-05-14 NOTE — ED Notes (Signed)
MD at bedside for I+D

## 2016-05-14 NOTE — ED Provider Notes (Signed)
CSN: [id number]     Arrival date & time 05/14/16  0320 History   By signing my name below, I, Suzan SlickAshley N. Elon SpannerLeger, attest that this documentation has been prepared under the direction and in the presence of Gilda Creasehristopher J Pollina, MD.  Electronically Signed: Suzan SlickAshley N. Elon SpannerLeger, ED Scribe. 05/14/2016. 3:38 AM.   Chief Complaint  Patient presents with  . Abscess   The history is provided by the patient. No language interpreter was used.     HPI Comments: Darren Pearson is a 50 y.o. male with a PMHx of HTN who presents to the Emergency Department here for a possible spider bite to the bottom of his R testicle x 3 days. He states area started as a "pimple" and gradually increased in size with associated pain. Discomfort is exacerbated with ambulation. No alleviating factors at this time. Pt states he has also noted drainage from area. No interventions attempted prior to arrival. No recent fever or chills. Pt with known allergy to Sulfa antimitotics.  PCP: Nicki ReaperBAITY, REGINA, NP    Past Medical History  Diagnosis Date  . Asthma   . Allergy   . Hypertension   . Hepatitis C   . History of chicken pox   . Anxiety and depression    History reviewed. No pertinent past surgical history. Family History  Problem Relation Age of Onset  . Stomach cancer Mother   . Diabetes Father   . Paget's disease of bone Father    Social History  Substance Use Topics  . Smoking status: Current Some Day Smoker -- 0.25 packs/day    Types: Cigarettes    Start date: 11/24/2012  . Smokeless tobacco: None  . Alcohol Use: 1.2 oz/week    2 Cans of beer per week     Comment: social    Review of Systems  Constitutional: Negative for fever and chills.  Respiratory: Negative for shortness of breath.   Gastrointestinal: Negative for nausea and vomiting.  Skin: Positive for wound.  Neurological: Negative for headaches.  Psychiatric/Behavioral: Negative for confusion.  All other systems reviewed and are  negative.     Allergies  Sulfa antibiotics  Home Medications   Prior to Admission medications   Medication Sig Start Date End Date Taking? Authorizing Provider  doxycycline (VIBRAMYCIN) 100 MG capsule Take 1 capsule (100 mg total) by mouth 2 (two) times daily. 05/14/16   Gilda Creasehristopher J Pollina, MD  levofloxacin (LEVAQUIN) 500 MG tablet Take 1 tablet (500 mg total) by mouth daily. Patient not taking: Reported on 05/14/2016 11/02/15   Tomasita CrumbleAdeleke Oni, MD  ondansetron (ZOFRAN ODT) 4 MG disintegrating tablet Take 1 tablet (4 mg total) by mouth every 8 (eight) hours as needed for nausea or vomiting. Patient not taking: Reported on 05/14/2016 02/04/15   Everlene FarrierWilliam Dansie, PA-C  oxyCODONE-acetaminophen (PERCOCET) 5-325 MG tablet Take 1-2 tablets by mouth every 4 (four) hours as needed. 05/14/16   Gilda Creasehristopher J Pollina, MD   Triage Vitals: BP 162/98 mmHg  Pulse 79  Temp(Src) 98.6 F (37 C) (Oral)  Resp 16  SpO2 97%   Physical Exam  Constitutional: He is oriented to person, place, and time. He appears well-developed and well-nourished. No distress.  HENT:  Head: Normocephalic and atraumatic.  Right Ear: Hearing normal.  Left Ear: Hearing normal.  Nose: Nose normal.  Mouth/Throat: Oropharynx is clear and moist and mucous membranes are normal.  Eyes: Conjunctivae and EOM are normal. Pupils are equal, round, and reactive to light.  Neck: Normal range  of motion. Neck supple.  Cardiovascular: Normal rate, regular rhythm, S1 normal, S2 normal and normal heart sounds.  Exam reveals no gallop and no friction rub.   No murmur heard. Pulmonary/Chest: Effort normal and breath sounds normal. No respiratory distress. He exhibits no tenderness.  Abdominal: Soft. Normal appearance and bowel sounds are normal. There is no hepatosplenomegaly. There is no tenderness. There is no rebound, no guarding, no tenderness at McBurney's point and negative Murphy's sign. No hernia.  Musculoskeletal: Normal range of motion.   Neurological: He is alert and oriented to person, place, and time. He has normal strength. No cranial nerve deficit or sensory deficit. Coordination normal. GCS eye subscore is 4. GCS verbal subscore is 5. GCS motor subscore is 6.  Skin: Skin is warm, dry and intact. No rash noted. No cyanosis.  1 cm ulcerated area with central eschar. Induration and swelling surrounding it.  Psychiatric: He has a normal mood and affect. His speech is normal and behavior is normal. Thought content normal.  Nursing note and vitals reviewed.   ED Course  Procedures (including critical care time)  DIAGNOSTIC STUDIES:   COORDINATION OF CARE: 3:30 AM- Will perform I&D procedure. Discussed treatment plan with pt at bedside and pt agreed to plan.     INCISION AND DRAINAGE PROCEDURE NOTE: Patient identification was confirmed and verbal consent was obtained. This procedure was performed by Orpah Greek, MD at 7:14 AM. Site: scrotum Sterile procedures observed Needle size: 25 Anesthetic used (type and amt): 12ml Blade size: 11 Drainage: moderate Complexity: Complex Packing used1/4" iodoform Site anesthetized, incision made over site, wound drained and explored loculations, rinsed with copious amounts of normal saline, wound packed with sterile gauze, covered with dry, sterile dressing.  Pt tolerated procedure well without complications.  Instructions for care discussed verbally and pt provided with additional written instructions for homecare and f/u.   Labs Review Labs Reviewed  CBC WITH DIFFERENTIAL/PLATELET - Abnormal; Notable for the following:    RBC 3.85 (*)    Hemoglobin 12.5 (*)    HCT 36.4 (*)    All other components within normal limits  BASIC METABOLIC PANEL - Abnormal; Notable for the following:    Potassium 3.3 (*)    All other components within normal limits  RPR  HIV ANTIBODY (ROUTINE TESTING)    Imaging Review Ct Pelvis W Contrast  05/14/2016  CLINICAL DATA:  Assess  for scrotal infection. Possible spider bite to RIGHT testicle 3 days ago. EXAM: CT PELVIS WITH CONTRAST TECHNIQUE: Multidetector CT imaging of the pelvis was performed using the standard protocol following the bolus administration of intravenous contrast. CONTRAST:  155mL ISOVUE-300 IOPAMIDOL (ISOVUE-300) INJECTION 61% COMPARISON:  None. FINDINGS: 11 x 8 x 27 mm rim enhancing fluid collection LEFT scrotal soft tissues. Small hydroceles. No subcutaneous gas or radiopaque foreign bodies. Preservation of the perineal fat. Pelvic contents are normal. Mild calcific atherosclerosis of the aortoiliac vessels. Bridging sacroiliac osteophytes. No destructive bony lesions. IMPRESSION: 11 x 8 x 27 mm LEFT scrotal abscess. Small bilateral hydroceles. Findings would likely be better characterized on scrotal sonogram. No CT findings of Fournier's gangrene. Electronically Signed   By: Elon Alas M.D.   On: 05/14/2016 06:44   I have personally reviewed and evaluated these images and lab results as part of my medical decision-making.   EKG Interpretation None      MDM   Final diagnoses:  Scrotal abscess   Patient presents to the emergency department for evaluation of scrotal  lesion. Patient was concerned about spider bite, examination was more consistent with abscess. He is not diabetic. He did not appear toxic. CT scan was performed to rule out Fournier's and deep infection. CT scan confirms scrotal abscess without other significant abnormality. No evidence of deep infection. Patient is afebrile and has no elevated white blood cell count. Drainage was performed at the bedside. Packing placed. Patient is comfortable removing the packing himself 2 days. Follow-up with urology. Return if symptoms worsen. Treat with doxycycline and analgesia.  I personally performed the services described in this documentation, which was scribed in my presence. The recorded information has been reviewed and is  accurate.   Orpah Greek, MD 05/14/16 [phone]

## 2016-05-14 NOTE — Discharge Instructions (Signed)

## 2016-05-14 NOTE — ED Notes (Signed)
Pt verbalized understanding of d/c instructions, prescriptions, and follow-up care. No further questions/concerns, VSS, ambulatory w/ steady gait (refused wheelchair) 

## 2016-05-14 NOTE — ED Notes (Signed)
Pt taken to CT.

## 2016-05-14 NOTE — ED Notes (Addendum)
Pt to ED c/o "spider bite" to L scrotum since Saturday. Pain worsens when ambulating. Denies drainage from area

## 2016-05-16 LAB — AEROBIC CULTURE W GRAM STAIN (SUPERFICIAL SPECIMEN): Special Requests: NORMAL

## 2016-05-17 ENCOUNTER — Telehealth (HOSPITAL_BASED_OUTPATIENT_CLINIC_OR_DEPARTMENT_OTHER): Payer: Self-pay

## 2016-05-17 NOTE — Telephone Encounter (Signed)
Post ED Visit - Positive Culture Follow-up  Culture report reviewed by antimicrobial stewardship pharmacist:  []  Elenor Quinones, Pharm.D. []  Heide Guile, Pharm.D., BCPS []  Parks Neptune, Pharm.D. []  Alycia Rossetti, Pharm.D., BCPS []  [place], [place].D., BCPS, AAHIVP []  Legrand Como, Pharm.D., BCPS, AAHIVP []  Milus Glazier, Pharm.D. []  Rob Evette Doffing, Pharm.D. Willis Modena Pharm D Positive abcess culture Treated with Dpxycycline, organism sensitive to the same and no further patient follow-up is required at this time.  Genia Del 05/17/2016, 11:04 AM

## 2016-08-12 ENCOUNTER — Emergency Department (HOSPITAL_COMMUNITY): Payer: 59

## 2016-08-12 ENCOUNTER — Encounter (HOSPITAL_COMMUNITY): Payer: Self-pay

## 2016-08-12 DIAGNOSIS — I10 Essential (primary) hypertension: Secondary | ICD-10-CM | POA: Diagnosis not present

## 2016-08-12 DIAGNOSIS — J45909 Unspecified asthma, uncomplicated: Secondary | ICD-10-CM | POA: Insufficient documentation

## 2016-08-12 DIAGNOSIS — Z79899 Other long term (current) drug therapy: Secondary | ICD-10-CM | POA: Diagnosis not present

## 2016-08-12 DIAGNOSIS — F17210 Nicotine dependence, cigarettes, uncomplicated: Secondary | ICD-10-CM | POA: Insufficient documentation

## 2016-08-12 DIAGNOSIS — R0602 Shortness of breath: Secondary | ICD-10-CM | POA: Diagnosis present

## 2016-08-12 DIAGNOSIS — J209 Acute bronchitis, unspecified: Secondary | ICD-10-CM | POA: Insufficient documentation

## 2016-08-12 NOTE — ED Triage Notes (Signed)
Pt c/o clear colored phlegm that has been going on for the past two days, unable to cough up, pt states he has been SOB for the past two days when trying to go upstairs. Hx of asthma and is out of inhaler

## 2016-08-13 ENCOUNTER — Emergency Department (HOSPITAL_COMMUNITY)
Admission: EM | Admit: 2016-08-13 | Discharge: 2016-08-13 | Disposition: A | Discharge status: Home / Self Care | Payer: 59 | Attending: Emergency Medicine | Admitting: Emergency Medicine

## 2016-08-13 DIAGNOSIS — J209 Acute bronchitis, unspecified: Secondary | ICD-10-CM

## 2016-08-13 MED ORDER — AZITHROMYCIN 250 MG PO TABS: 250.0000 mg | ORAL_TABLET | Freq: Every day | ORAL | 0 refills | Status: DC

## 2016-08-13 MED ORDER — PREDNISONE 10 MG PO TABS: 20.0000 mg | ORAL_TABLET | Freq: Two times a day (BID) | ORAL | 0 refills | Status: DC

## 2016-08-13 MED ORDER — METHYLPREDNISOLONE SODIUM SUCC 125 MG IJ SOLR
125.0000 mg | Freq: Once | INTRAMUSCULAR | Status: AC
Start: 2016-08-13 — End: 2016-08-13
  Administered 2016-08-13: 125 mg via INTRAMUSCULAR
  Filled 2016-08-13: qty 2

## 2016-08-13 MED ORDER — IPRATROPIUM-ALBUTEROL 0.5-2.5 (3) MG/3ML IN SOLN
3.0000 mL | Freq: Once | RESPIRATORY_TRACT | Status: AC
  Administered 2016-08-13: 3 mL via RESPIRATORY_TRACT
  Filled 2016-08-13: qty 3

## 2016-08-13 MED ORDER — ALBUTEROL SULFATE HFA 108 (90 BASE) MCG/ACT IN AERS
2.0000 | INHALATION_SPRAY | RESPIRATORY_TRACT | Status: DC | PRN
  Administered 2016-08-13: 2 via RESPIRATORY_TRACT
  Filled 2016-08-13: qty 6.7

## 2016-08-13 NOTE — Discharge Instructions (Signed)
Zithromax and prednisone as prescribed.  Albuterol MDI: 2 puffs every 4 hours as needed for wheezing.  Return to the emergency department if your symptoms significantly worsen or change.

## 2016-08-13 NOTE — ED Notes (Signed)
Pt provided with d/c instructions at this time. Pt verbalizes understanding of d/c instructions as well as follow up procedure after d/c. Pt provided with RX for zithromax and prednisone. Pt verbalizes understanding of RX directions. Pt in no apparent distress at this time. Pt ambulatory at time of d/c.

## 2016-08-13 NOTE — ED Provider Notes (Signed)
[facility] DEPT Provider Note   CSN: [id number] Arrival date & time: 08/12/16  1948   By signing my name below, I, Delton Prairie, attest that this documentation has been prepared under the direction and in the presence of Veryl Speak, MD  Electronically Signed: Delton Prairie, ED Scribe. 08/13/16. 12:58 AM.   History   Chief Complaint Chief Complaint  Patient presents with  . Shortness of Breath    The history is provided by the patient. No language interpreter was used.  Shortness of Breath  This is a new problem. The problem occurs continuously.The current episode started 2 days ago. The problem has not changed since onset.Associated symptoms include cough. Pertinent negatives include no fever and no chest pain. Risk factors include smoking. He has tried nothing for the symptoms.   HPI Comments:  Darren Pearson is a 50 y.o. male, with a hx of asthma, who presents to the Emergency Department complaining of SOB onset 2 days ago. He notes an associated cough. Pt states he has not been able to cough up any phlegm. He is a smoker who smokes 1 pack every 2 days. He notes he works at an [employer] and breathes in various powders. Pt denies any fevers, chest pain or being on any medications. No alleviating factors noted. Pt denies any other complaints at this time.   Past Medical History:  Diagnosis Date  . Allergy   . Anxiety and depression   . Asthma   . Hepatitis C   . History of chicken pox   . Hypertension     Patient Active Problem List   Diagnosis Date Noted  . Elevated blood pressure 03/15/2015  . Rash and nonspecific skin eruption 03/15/2015  . Hepatitis C 04/12/2013  . Allergic rhinitis 04/12/2013  . Unspecified asthma(493.90) 04/12/2013    No past surgical history on file.     Home Medications    Prior to Admission medications   Medication Sig Start Date End Date Taking? Authorizing Provider  doxycycline (VIBRAMYCIN) 100 MG capsule Take 1  capsule (100 mg total) by mouth 2 (two) times daily. 05/14/16   Orpah Greek, MD  levofloxacin (LEVAQUIN) 500 MG tablet Take 1 tablet (500 mg total) by mouth daily. Patient not taking: Reported on 05/14/2016 11/02/15   Everlene Balls, MD  ondansetron (ZOFRAN ODT) 4 MG disintegrating tablet Take 1 tablet (4 mg total) by mouth every 8 (eight) hours as needed for nausea or vomiting. Patient not taking: Reported on 05/14/2016 02/04/15   Waynetta Pean, PA-C  oxyCODONE-acetaminophen (PERCOCET) 5-325 MG tablet Take 1-2 tablets by mouth every 4 (four) hours as needed. 05/14/16   Orpah Greek, MD    Family History Family History  Problem Relation Age of Onset  . Stomach cancer Mother   . Diabetes Father   . Paget's disease of bone Father     Social History Social History  Substance Use Topics  . Smoking status: Current Some Day Smoker    Packs/day: 0.25    Types: Cigarettes    Start date: 11/24/2012  . Smokeless tobacco: Never Used  . Alcohol use 1.2 oz/week    2 Cans of beer per week     Comment: social     Allergies   Sulfa antibiotics   Review of Systems Review of Systems  Constitutional: Negative for fever.  Respiratory: Positive for cough and shortness of breath.   Cardiovascular: Negative for chest pain.  All other systems reviewed and are negative.  Physical Exam Updated Vital Signs BP 161/96 (BP Location: Right Arm)   Pulse 79   Temp 98.4 F (36.9 C) (Oral)   Resp 18   Ht 5\' 7"  (1.702 m)   Wt 150 lb (68 kg)   SpO2 98%   BMI 23.49 kg/m   Physical Exam  Constitutional: He is oriented to person, place, and time. He appears well-developed and well-nourished. No distress.  HENT:  Head: Normocephalic and atraumatic.  Right Ear: Hearing normal.  Left Ear: Hearing normal.  Nose: Nose normal.  Mouth/Throat: Oropharynx is clear and moist and mucous membranes are normal.  Eyes: Conjunctivae and EOM are normal. Pupils are equal, round, and reactive to  light.  Neck: Normal range of motion. Neck supple.  Cardiovascular: Regular rhythm, S1 normal and S2 normal.  Exam reveals no gallop and no friction rub.   No murmur heard. Pulmonary/Chest: Effort normal. No respiratory distress. He exhibits no tenderness.  Bilateral expiratory rhonchi present   Abdominal: Soft. Normal appearance and bowel sounds are normal. There is no hepatosplenomegaly. There is no tenderness. There is no rebound, no guarding, no tenderness at McBurney's point and negative Murphy's sign. No hernia.  Musculoskeletal: Normal range of motion.  Neurological: He is alert and oriented to person, place, and time. He has normal strength. No cranial nerve deficit or sensory deficit. Coordination normal. GCS eye subscore is 4. GCS verbal subscore is 5. GCS motor subscore is 6.  Skin: Skin is warm, dry and intact. No rash noted. No cyanosis.  Psychiatric: He has a normal mood and affect. His speech is normal and behavior is normal. Thought content normal.  Nursing note and vitals reviewed.    ED Treatments / Results  DIAGNOSTIC STUDIES:  Oxygen Saturation is 98% on RA, normal by my interpretation.    COORDINATION OF CARE:  12:54 AM Discussed treatment plan with pt at bedside and pt agreed to plan.  Labs (all labs ordered are listed, but only abnormal results are displayed) Labs Reviewed - No data to display  EKG  EKG Interpretation None       Radiology Dg Chest 2 View  Result Date: 08/12/2016 CLINICAL DATA:  Acute onset of shortness of breath and congestion. Initial encounter. EXAM: CHEST  2 VIEW COMPARISON:  Chest radiograph performed 05/30/2014 FINDINGS: The lungs are well-aerated. Prominent emphysematous change is noted bilaterally, with underlying scarring. There is no evidence of pleural effusion or pneumothorax. The heart is normal in size; the mediastinal contour is within normal limits. No acute osseous abnormalities are seen. IMPRESSION: Bilateral emphysema,  with underlying scarring. No acute focal airspace consolidation seen. Electronically Signed   By: Garald Balding M.D.   On: 08/12/2016 21:17    Procedures Procedures (including critical care time)  Medications Ordered in ED Medications - No data to display   Initial Impression / Assessment and Plan / ED Course  I have reviewed the triage vital signs and the nursing notes.  Pertinent labs & imaging results that were available during my care of the patient were reviewed by me and considered in my medical decision making (see chart for details).  Clinical Course    Patient presents with complaints of wheezing and chest congestion for the past several days. He has a history of smoking cigarettes and has done so for many years. His chest x-ray shows what appears to be some underlying emphysema. He was given an albuterol treatment along with an IM injection of the steroid. He appears to be feeling  better. He will be discharged with an albuterol inhaler, prednisone, and Zithromax.  His blood pressure is elevated here in the emergency department and I have advised him to keep a record of this at home.  Final Clinical Impressions(s) / ED Diagnoses   Final diagnoses:  None    New Prescriptions New Prescriptions   No medications on file  I personally performed the services described in this documentation, which was scribed in my presence. The recorded information has been reviewed and is accurate.        Veryl Speak, MD 08/13/16 [phone]

## 2016-08-13 NOTE — ED Notes (Signed)
Pt states that his breathing "feels some better" after his nebulizer treatment.  Pt's lung sounds are noted to have decreased inspiratory wheezes at this time.

## 2016-08-23 IMAGING — US US ART/VEN ABD/PELV/SCROTUM DOPPLER LTD
1 series · 13 of 25 positions shown · non-contrast
Comparison: None.

CLINICAL DATA: 49-year-old male with right testicular pain.

EXAM:
SCROTAL ULTRASOUND
DOPPLER ULTRASOUND OF THE TESTICLES
TECHNIQUE: Complete ultrasound examination of the testicles, epididymis, and
other scrotal structures was performed. Color and spectral Doppler
ultrasound were also utilized to evaluate blood flow to the
testicles.

[Series 1: us art/ven abd/pelv/scrotum doppler ltd · 0.08mm/px · 13 of 60 slices shown]
[im 1/60]
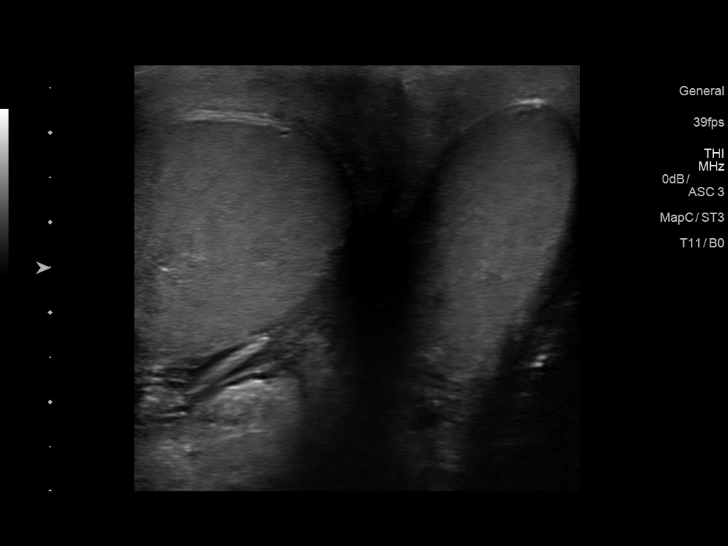
[im 5/60]
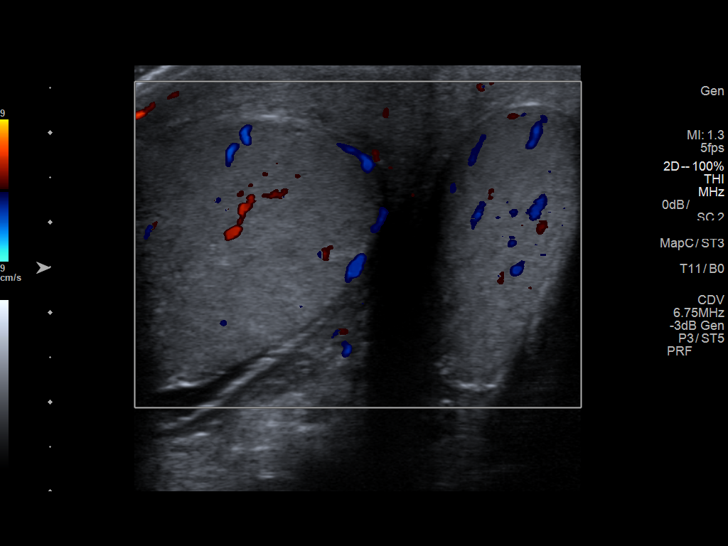
[im 10/60]
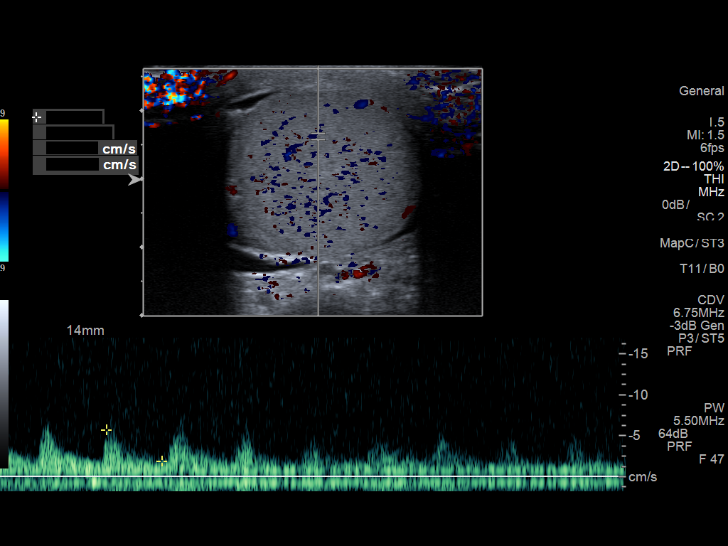
[im 15/60]
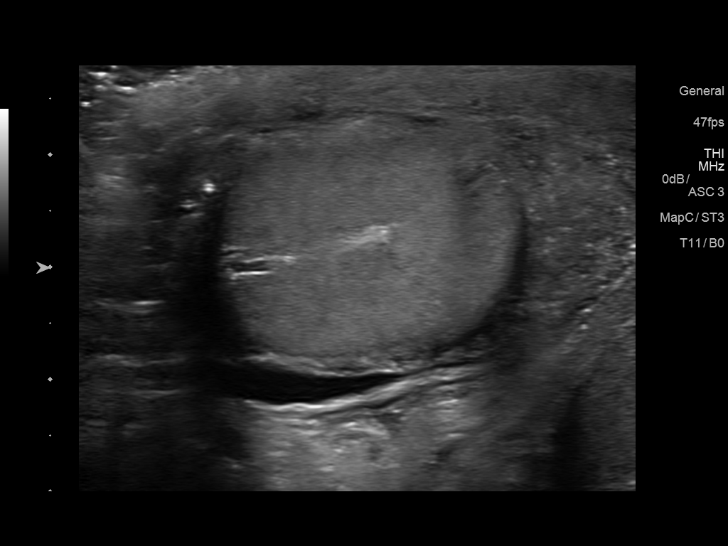
[im 20/60]
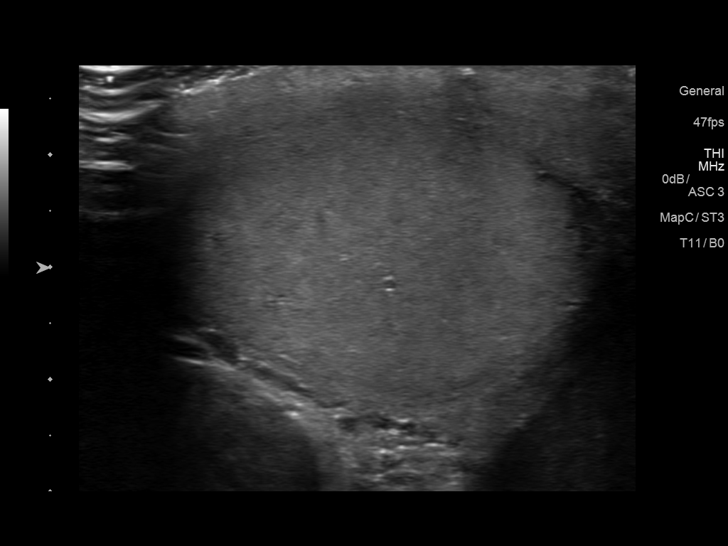
[im 25/60]
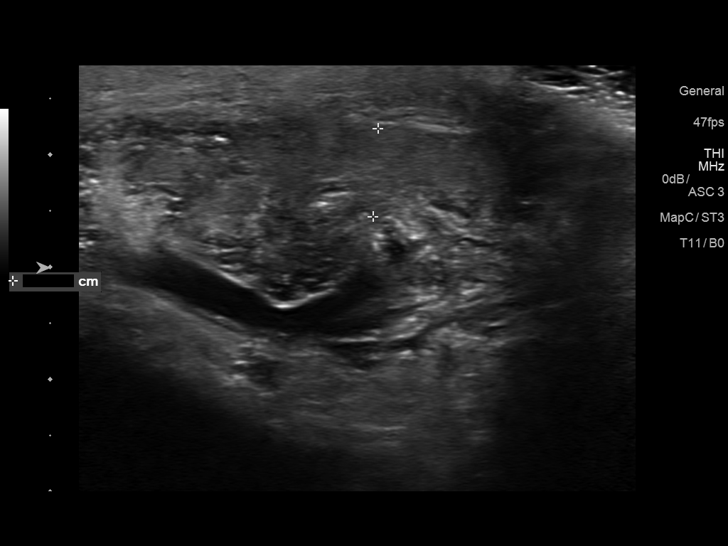
[im 30/60]
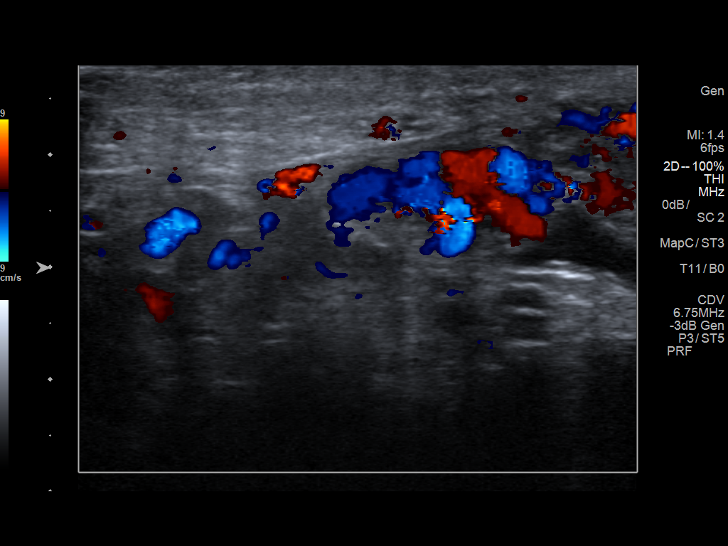
[im 35/60]
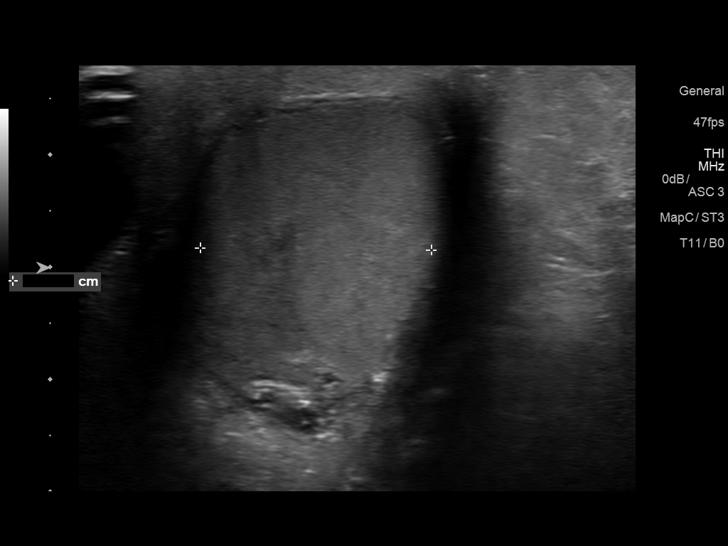
[im 40/60]
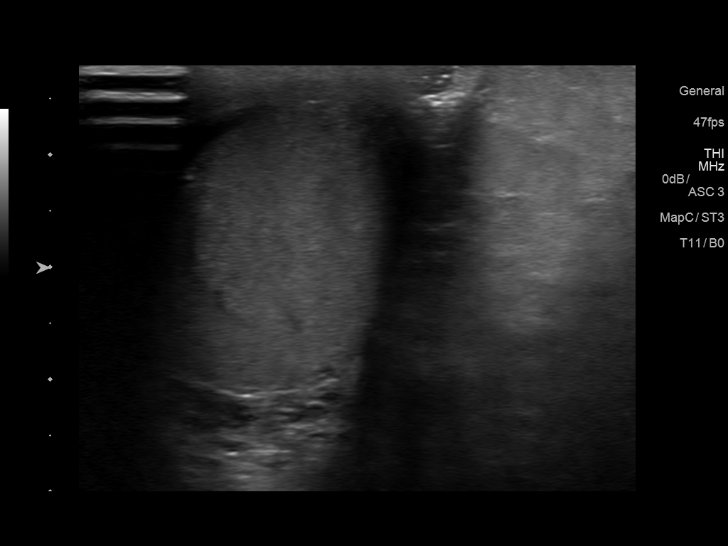
[im 45/60]
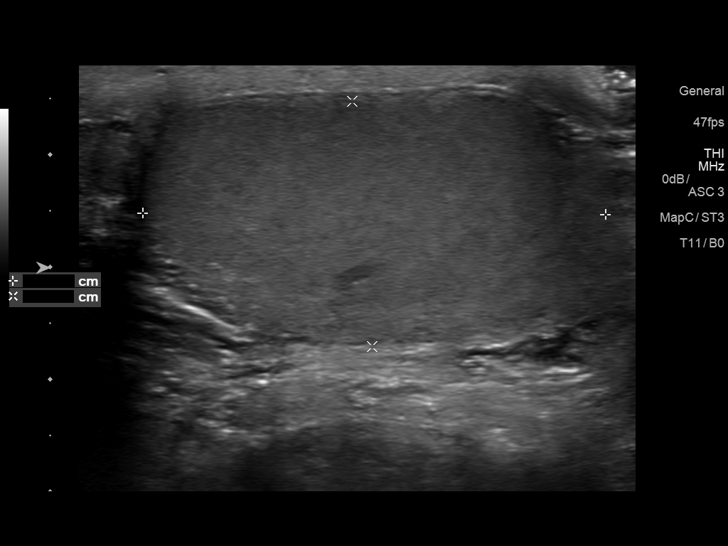
[im 50/60]
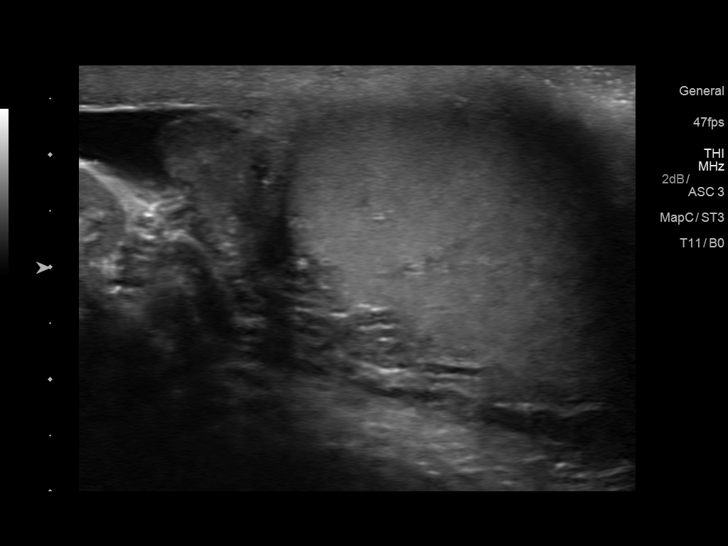
[im 55/60]
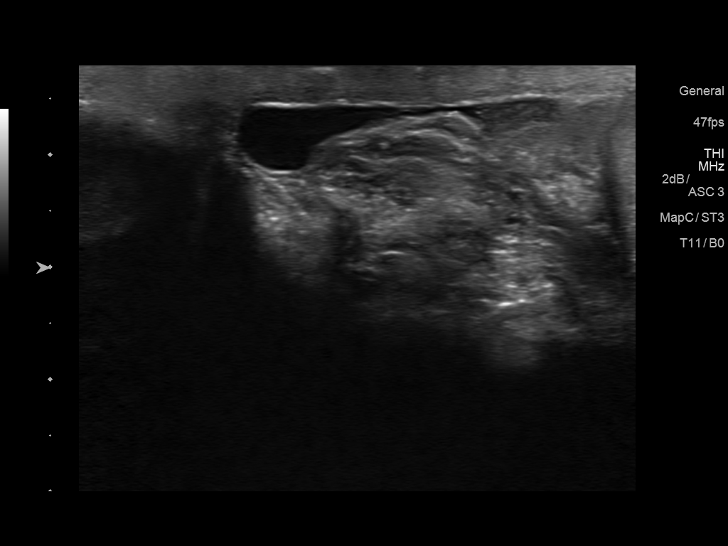
[im 60/60]
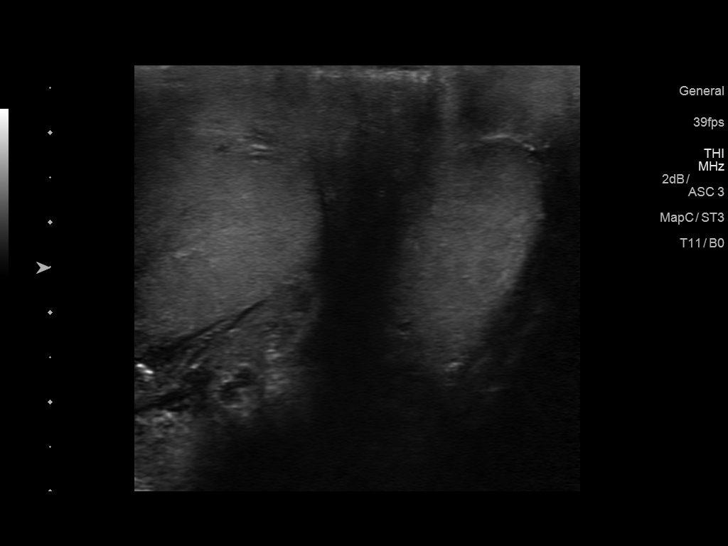

[13 of 25 positions shown; findings below may reference images not displayed]

FINDINGS: Right testicle

Measurements: 3.9 x 2.4 x 3.0 cm. No mass or microlithiasis
visualized.

Left testicle

Measurements: 4.1 x 2.2 x 2.1 cm. No mass or microlithiasis
visualized.

Right epididymis: The right epididymis is enlarged, heterogeneous,
and hyperemic.

Left epididymis:  Normal in size and appearance.

Hydrocele:  Small right hydrocele noted.

Varicocele: There is prominence of the right venous plexus measuring
up to 7 mm on Valsalva maneuver.

Pulsed Doppler interrogation of both testes demonstrates normal low
resistance arterial and venous waveforms bilaterally.
IMPRESSION: Unremarkable testicles. Bilateral testicular Doppler flow
identified.

Mildly enlarged, heterogeneous, and hyperemic right epididymis
concerning for epididymitis. Clinical correlation is recommended.

Right-sided varicocele. Although this finding may be reactive
hyperemia to underlying infectious process, Isolated right-sided
varicocele raise concern for possible retroperitoneal mass or renal
vein thrombosis. Clinical correlation is recommended. Further
evaluation with repeat ultrasound following treatment of the
underlying infection or follow-up with CT of the abdomen pelvis with
contrast recommended for evaluation of the upper abdomen and
retroperitoneum. Urology consult is advised.

## 2018-11-14 ENCOUNTER — Emergency Department (HOSPITAL_COMMUNITY)
Admission: EM | Admit: 2018-11-14 | Discharge: 2018-11-14 | Disposition: A | Discharge status: Home / Self Care | Payer: BLUE CROSS/BLUE SHIELD | Attending: Emergency Medicine | Admitting: Emergency Medicine

## 2018-11-14 ENCOUNTER — Encounter (HOSPITAL_COMMUNITY): Payer: Self-pay

## 2018-11-14 ENCOUNTER — Other Ambulatory Visit: Payer: Self-pay

## 2018-11-14 DIAGNOSIS — J45909 Unspecified asthma, uncomplicated: Secondary | ICD-10-CM | POA: Insufficient documentation

## 2018-11-14 DIAGNOSIS — H209 Unspecified iridocyclitis: Secondary | ICD-10-CM

## 2018-11-14 DIAGNOSIS — F17210 Nicotine dependence, cigarettes, uncomplicated: Secondary | ICD-10-CM | POA: Insufficient documentation

## 2018-11-14 MED ORDER — HOMATROPINE HBR 5 % OP SOLN: 1.0000 [drp] | Freq: Once | OPHTHALMIC | Status: DC

## 2018-11-14 MED ORDER — KETOROLAC TROMETHAMINE 0.5 % OP SOLN
1.0000 [drp] | Freq: Once | OPHTHALMIC | Status: AC
  Administered 2018-11-14: 1 [drp] via OPHTHALMIC
  Filled 2018-11-14: qty 5

## 2018-11-14 MED ORDER — ATROPINE SULFATE 1 % OP SOLN
1.0000 [drp] | Freq: Once | OPHTHALMIC | Status: DC
  Filled 2018-11-14: qty 2

## 2018-11-14 MED ORDER — FLUORESCEIN SODIUM 1 MG OP STRP
1.0000 | ORAL_STRIP | Freq: Once | OPHTHALMIC | Status: AC
  Administered 2018-11-14: 1 via OPHTHALMIC
  Filled 2018-11-14: qty 1

## 2018-11-14 MED ORDER — TETRACAINE HCL 0.5 % OP SOLN
2.0000 [drp] | Freq: Once | OPHTHALMIC | Status: AC
  Administered 2018-11-14: 2 [drp] via OPHTHALMIC
  Filled 2018-11-14: qty 4

## 2018-11-14 NOTE — ED Notes (Signed)
ED Provider at bedside. 

## 2018-11-14 NOTE — ED Notes (Signed)
Patient verbalizes understanding of medications and discharge instructions. No further questions at this time. VSS and patient ambulatory at discharge.   
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
## 2018-11-14 NOTE — Discharge Instructions (Addendum)
Use the ketorolac eyedrop 1 in the left eye 4 times a day to help the discomfort and inflammation.  Call the eye doctor tomorrow morning to arrange a follow-up appointment to be seen in 1 or 2 days.

## 2019-11-20 ENCOUNTER — Emergency Department (HOSPITAL_COMMUNITY): Payer: Medicaid Other

## 2019-11-20 ENCOUNTER — Inpatient Hospital Stay (HOSPITAL_COMMUNITY)
Admission: EM | Admit: 2019-11-20 | Discharge: 2019-11-23 | DRG: 041 | Disposition: A | Discharge status: Home Health | Payer: Medicaid Other | Attending: Internal Medicine | Admitting: Internal Medicine

## 2019-11-20 ENCOUNTER — Encounter (HOSPITAL_COMMUNITY): Payer: Self-pay

## 2019-11-20 ENCOUNTER — Other Ambulatory Visit: Payer: Self-pay

## 2019-11-20 DIAGNOSIS — I639 Cerebral infarction, unspecified: Secondary | ICD-10-CM

## 2019-11-20 DIAGNOSIS — I119 Hypertensive heart disease without heart failure: Secondary | ICD-10-CM | POA: Diagnosis present

## 2019-11-20 DIAGNOSIS — F418 Other specified anxiety disorders: Secondary | ICD-10-CM | POA: Diagnosis present

## 2019-11-20 DIAGNOSIS — I6340 Cerebral infarction due to embolism of unspecified cerebral artery: Secondary | ICD-10-CM | POA: Diagnosis present

## 2019-11-20 DIAGNOSIS — I671 Cerebral aneurysm, nonruptured: Secondary | ICD-10-CM | POA: Diagnosis present

## 2019-11-20 DIAGNOSIS — E785 Hyperlipidemia, unspecified: Secondary | ICD-10-CM | POA: Diagnosis present

## 2019-11-20 DIAGNOSIS — R7989 Other specified abnormal findings of blood chemistry: Secondary | ICD-10-CM | POA: Diagnosis present

## 2019-11-20 DIAGNOSIS — G8194 Hemiplegia, unspecified affecting left nondominant side: Secondary | ICD-10-CM | POA: Diagnosis present

## 2019-11-20 DIAGNOSIS — R270 Ataxia, unspecified: Secondary | ICD-10-CM | POA: Diagnosis present

## 2019-11-20 DIAGNOSIS — W19XXXA Unspecified fall, initial encounter: Secondary | ICD-10-CM | POA: Diagnosis present

## 2019-11-20 DIAGNOSIS — B1920 Unspecified viral hepatitis C without hepatic coma: Secondary | ICD-10-CM | POA: Diagnosis present

## 2019-11-20 DIAGNOSIS — Z20828 Contact with and (suspected) exposure to other viral communicable diseases: Secondary | ICD-10-CM | POA: Diagnosis present

## 2019-11-20 DIAGNOSIS — Z833 Family history of diabetes mellitus: Secondary | ICD-10-CM | POA: Diagnosis not present

## 2019-11-20 DIAGNOSIS — Z79899 Other long term (current) drug therapy: Secondary | ICD-10-CM | POA: Diagnosis not present

## 2019-11-20 DIAGNOSIS — E7220 Disorder of urea cycle metabolism, unspecified: Secondary | ICD-10-CM | POA: Diagnosis present

## 2019-11-20 DIAGNOSIS — Z716 Tobacco abuse counseling: Secondary | ICD-10-CM | POA: Diagnosis not present

## 2019-11-20 DIAGNOSIS — E876 Hypokalemia: Secondary | ICD-10-CM | POA: Diagnosis present

## 2019-11-20 DIAGNOSIS — Z7289 Other problems related to lifestyle: Secondary | ICD-10-CM | POA: Diagnosis not present

## 2019-11-20 DIAGNOSIS — F1290 Cannabis use, unspecified, uncomplicated: Secondary | ICD-10-CM | POA: Diagnosis present

## 2019-11-20 DIAGNOSIS — N179 Acute kidney failure, unspecified: Secondary | ICD-10-CM | POA: Diagnosis present

## 2019-11-20 DIAGNOSIS — F17210 Nicotine dependence, cigarettes, uncomplicated: Secondary | ICD-10-CM | POA: Diagnosis present

## 2019-11-20 DIAGNOSIS — E538 Deficiency of other specified B group vitamins: Secondary | ICD-10-CM | POA: Diagnosis present

## 2019-11-20 DIAGNOSIS — Z7952 Long term (current) use of systemic steroids: Secondary | ICD-10-CM | POA: Diagnosis not present

## 2019-11-20 DIAGNOSIS — J302 Other seasonal allergic rhinitis: Secondary | ICD-10-CM | POA: Diagnosis present

## 2019-11-20 DIAGNOSIS — I10 Essential (primary) hypertension: Secondary | ICD-10-CM | POA: Diagnosis present

## 2019-11-20 DIAGNOSIS — Z882 Allergy status to sulfonamides status: Secondary | ICD-10-CM

## 2019-11-20 DIAGNOSIS — I160 Hypertensive urgency: Secondary | ICD-10-CM | POA: Diagnosis present

## 2019-11-20 DIAGNOSIS — R296 Repeated falls: Secondary | ICD-10-CM | POA: Diagnosis present

## 2019-11-20 LAB — COMPREHENSIVE METABOLIC PANEL
ALT: 13 U/L (ref 0–44)
AST: 24 U/L (ref 15–41)
Albumin: 3.7 g/dL (ref 3.5–5.0)
Alkaline Phosphatase: 89 U/L (ref 38–126)
Anion gap: 10 (ref 5–15)
BUN: 13 mg/dL (ref 6–20)
CO2: 26 mmol/L (ref 22–32)
Calcium: 8.8 mg/dL — ABNORMAL LOW (ref 8.9–10.3)
Chloride: 104 mmol/L (ref 98–111)
Creatinine, Ser: 1.27 mg/dL — ABNORMAL HIGH (ref 0.61–1.24)
GFR calc Af Amer: >60 mL/min (ref >60)
GFR calc non Af Amer: >60 mL/min (ref >60)
Glucose, Bld: 91 mg/dL (ref 70–99)
Potassium: 3.1 mmol/L — ABNORMAL LOW (ref 3.5–5.1)
Sodium: 140 mmol/L (ref 135–145)
Total Bilirubin: 1 mg/dL (ref 0.3–1.2)
Total Protein: 8 g/dL (ref 6.5–8.1)

## 2019-11-20 LAB — CBC WITH DIFFERENTIAL/PLATELET
Abs Immature Granulocytes: 0.02 10*3/uL (ref 0.00–0.07)
Basophils Absolute: 0 10*3/uL (ref 0.0–0.1)
Basophils Relative: 1 %
Eosinophils Absolute: 0.3 10*3/uL (ref 0.0–0.5)
Eosinophils Relative: 4 %
HCT: 38 % — ABNORMAL LOW (ref 39.0–52.0)
Hemoglobin: 12.2 g/dL — ABNORMAL LOW (ref 13.0–17.0)
Immature Granulocytes: 0 %
Lymphocytes Relative: 20 %
Lymphs Abs: 1.3 10*3/uL (ref 0.7–4.0)
MCH: 30.7 pg (ref 26.0–34.0)
MCHC: 32.1 g/dL (ref 30.0–36.0)
MCV: 95.5 fL (ref 80.0–100.0)
Monocytes Absolute: 0.4 10*3/uL (ref 0.1–1.0)
Monocytes Relative: 7 %
Neutro Abs: 4.4 10*3/uL (ref 1.7–7.7)
Neutrophils Relative %: 68 %
Platelets: 232 10*3/uL (ref 150–400)
RBC: 3.98 MIL/uL — ABNORMAL LOW (ref 4.22–5.81)
RDW: 12.7 % (ref 11.5–15.5)
WBC: 6.4 10*3/uL (ref 4.0–10.5)
nRBC: 0 % (ref 0.0–0.2)

## 2019-11-20 LAB — URINALYSIS, ROUTINE W REFLEX MICROSCOPIC
Bilirubin Urine: NEGATIVE
Glucose, UA: NEGATIVE mg/dL
Hgb urine dipstick: NEGATIVE
Ketones, ur: NEGATIVE mg/dL
Leukocytes,Ua: NEGATIVE
Nitrite: NEGATIVE
Protein, ur: NEGATIVE mg/dL
Specific Gravity, Urine: 1.013 (ref 1.005–1.030)
pH: 7 (ref 5.0–8.0)

## 2019-11-20 LAB — AMMONIA: Ammonia: 55 umol/L — ABNORMAL HIGH (ref 9–35)

## 2019-11-20 MED ORDER — ACETAMINOPHEN 160 MG/5ML PO SOLN: 650.0000 mg | ORAL | Status: DC | PRN

## 2019-11-20 MED ORDER — ACETAMINOPHEN 325 MG PO TABS: 650.0000 mg | ORAL_TABLET | ORAL | Status: DC | PRN

## 2019-11-20 MED ORDER — SODIUM CHLORIDE 0.9 % IV BOLUS
1000.0000 mL | Freq: Once | INTRAVENOUS | Status: AC
  Administered 2019-11-20: 1000 mL via INTRAVENOUS

## 2019-11-20 MED ORDER — ASPIRIN 325 MG PO TABS: 325.0000 mg | ORAL_TABLET | Freq: Every day | ORAL | Status: DC

## 2019-11-20 MED ORDER — ACETAMINOPHEN 650 MG RE SUPP: 650.0000 mg | RECTAL | Status: DC | PRN

## 2019-11-20 MED ORDER — ENOXAPARIN SODIUM 40 MG/0.4ML ~~LOC~~ SOLN
40.0000 mg | SUBCUTANEOUS | Status: DC
  Administered 2019-11-20 – 2019-11-22 (×3): 40 mg via SUBCUTANEOUS
  Filled 2019-11-20 (×3): qty 0.4

## 2019-11-20 MED ORDER — NICOTINE 14 MG/24HR TD PT24
14.0000 mg | MEDICATED_PATCH | Freq: Every day | TRANSDERMAL | Status: DC
  Administered 2019-11-21 – 2019-11-23 (×4): 14 mg via TRANSDERMAL
  Filled 2019-11-20 (×4): qty 1

## 2019-11-20 MED ORDER — ASPIRIN 300 MG RE SUPP: 300.0000 mg | Freq: Every day | RECTAL | Status: DC

## 2019-11-20 MED ORDER — POTASSIUM CHLORIDE CRYS ER 20 MEQ PO TBCR
40.0000 meq | EXTENDED_RELEASE_TABLET | Freq: Once | ORAL | Status: AC
  Administered 2019-11-20: 40 meq via ORAL
  Filled 2019-11-20: qty 2

## 2019-11-20 MED ORDER — SENNOSIDES-DOCUSATE SODIUM 8.6-50 MG PO TABS: 1.0000 | ORAL_TABLET | Freq: Every evening | ORAL | Status: DC | PRN

## 2019-11-20 MED ORDER — STROKE: EARLY STAGES OF RECOVERY BOOK
Freq: Once | Status: DC
  Filled 2019-11-20 (×3): qty 1

## 2019-11-20 NOTE — ED Notes (Signed)
ED TO INPATIENT HANDOFF REPORT  Name/Age/Gender Darren Pearson 53 y.o. male  Code Status    Code Status Orders  (From admission, onward)         Start     Ordered   11/20/19 1759  Full code  Continuous     11/20/19 1803        Code Status History    This patient has a current code status but no historical code status.   Advance Care Planning Activity      Home/SNF/Other Home  Chief Complaint Acute CVA (cerebrovascular accident) ([place]) [I63.9]  Level of Care/Admitting Diagnosis ED Disposition    ED Disposition Condition Comment   Admit  Hospital Area: [facility] [[id number]]  Level of Care: Telemetry Medical [104]  Covid Evaluation: Asymptomatic Screening Protocol (No Symptoms)  Diagn[facility]: [id number]  Admitting Physician: Lavina Hamman [id number]  Attending Physician: Lavina Hamman [id number]  Estimated length of stay: past midnight tomorrow  Certification:: I certify this patient will need inpatient services for at least 2 midnights       Medical History Past Medical History:  Diagnosis Date  . Allergy   . Anxiety and depression   . Asthma   . Hepatitis C   . History of chicken pox   . Hypertension     Allergies Allergies  Allergen Reactions  . Sulfa Antibiotics Hives    IV Location/Drains/Wounds Patient Lines/Drains/Airways Status   Active Line/Drains/Airways    Name:   Placement date:   Placement time:   Site:   Days:   Peripheral IV 11/20/19 Left Antecubital   11/20/19    1220    Antecubital   less than 1          Labs/Imaging Results for orders placed or performed during the hospital encounter of 11/20/19 (from the past 48 hour(s))  CBC with Differential     Status: Abnormal   Collection Time: 11/20/19 10:06 AM  Result Value Ref Range   WBC 6.4 4.0 - 10.5 K/uL   RBC 3.98 (L) 4.22 - 5.81 MIL/uL   Hemoglobin 12.2 (L) 13.0 - 17.0 g/dL   HCT 38.0 (L) 39.0 - 52.0 %   MCV 95.5  80.0 - 100.0 fL   MCH 30.7 26.0 - 34.0 pg   MCHC 32.1 30.0 - 36.0 g/dL   RDW 12.7 11.5 - 15.5 %   Platelets 232 150 - 400 K/uL   nRBC 0.0 0.0 - 0.2 %   Neutrophils Relative % 68 %   Neutro Abs 4.4 1.7 - 7.7 K/uL   Lymphocytes Relative 20 %   Lymphs Abs 1.3 0.7 - 4.0 K/uL   Monocytes Relative 7 %   Monocytes Absolute 0.4 0.1 - 1.0 K/uL   Eosinophils Relative 4 %   Eosinophils Absolute 0.3 0.0 - 0.5 K/uL   Basophils Relative 1 %   Basophils Absolute 0.0 0.0 - 0.1 K/uL   Immature Granulocytes 0 %   Abs Immature Granulocytes 0.02 0.00 - 0.07 K/uL    Comment: Performed at [facility], [place] [address], [place], [place] [address]  Comprehensive metabolic panel     Status: Abnormal   Collection Time: 11/20/19 10:06 AM  Result Value Ref Range   Sodium 140 135 - 145 mmol/L   Potassium 3.1 (L) 3.5 - 5.1 mmol/L   Chloride 104 98 - 111 mmol/L   CO2 26 22 - 32 mmol/L   Glucose, Bld 91 70 -  99 mg/dL   BUN 13 6 - 20 mg/dL   Creatinine, Ser 1.27 (H) 0.61 - 1.24 mg/dL   Calcium 8.8 (L) 8.9 - 10.3 mg/dL   Total Protein 8.0 6.5 - 8.1 g/dL   Albumin 3.7 3.5 - 5.0 g/dL   AST 24 15 - 41 U/L   ALT 13 0 - 44 U/L   Alkaline Phosphatase 89 38 - 126 U/L   Total Bilirubin 1.0 0.3 - 1.2 mg/dL   GFR calc non Af Amer >60 >60 mL/min   GFR calc Af Amer >60 >60 mL/min   Anion gap 10 5 - 15    Comment: Performed at [facility], [place] [address] [place], [place] [address]  Ammonia     Status: Abnormal   Collection Time: 11/20/19 10:06 AM  Result Value Ref Range   Ammonia 55 (H) 9 - 35 umol/L    Comment: Performed at [facility], [place] [address] [place], [place] [address]  Urinalysis, Routine w reflex microscopic     Status: None   Collection Time: 11/20/19 10:06 AM  Result Value Ref Range   Color, Urine YELLOW YELLOW   APPearance CLEAR CLEAR   Specific Gravity, Urine 1.013 1.005 - 1.030   pH 7.0 5.0 - 8.0   Glucose, UA NEGATIVE NEGATIVE mg/dL    Hgb urine dipstick NEGATIVE NEGATIVE   Bilirubin Urine NEGATIVE NEGATIVE   Ketones, ur NEGATIVE NEGATIVE mg/dL   Protein, ur NEGATIVE NEGATIVE mg/dL   Nitrite NEGATIVE NEGATIVE   Leukocytes,Ua NEGATIVE NEGATIVE    Comment: Performed at [place] [address], [place], [place] [address]   DG Chest 2 View  Result Date: 11/20/2019 CLINICAL DATA:  Status post fall on left ribs. Shortness of breath. EXAM: CHEST - 2 VIEW COMPARISON:  August 12, 2016 FINDINGS: The heart size and mediastinal contours are within normal limits. Both lungs are clear. The lungs are hyperinflated. The visualized skeletal structures are unremarkable. The visualized left ribs demonstrate no acute fracture or dislocation. IMPRESSION: 1. No active cardiopulmonary disease.  COPD. 2. No acute fracture or dislocation of left ribs. Electronically Signed   By: Abelardo Diesel M.D.   On: 11/20/2019 10:03   CT Head Wo Contrast  Result Date: 11/20/2019 CLINICAL DATA:  Off balance for several weeks. Multiple falls. EXAM: CT HEAD WITHOUT CONTRAST TECHNIQUE: Contiguous axial images were obtained from the base of the skull through the vertex without intravenous contrast. COMPARISON:  None. FINDINGS: Brain: No evidence of acute infarction, hemorrhage, hydrocephalus, extra-axial collection or mass lesion/mass effect. There is chronic diffuse atrophy. Chronic bilateral periventricular white matter small vessel ischemic changes are noted. Small old lacunar infarction is identified in the right basal ganglia. Vascular: No hyperdense vessel is noted. Skull: Normal. Negative for fracture or focal lesion. Sinuses/Orbits: No acute finding. Other: None. IMPRESSION: 1. No focal acute intracranial abnormality identified. 2. Chronic diffuse atrophy. Chronic bilateral periventricular white matter small vessel ischemic change. Electronically Signed   By: Abelardo Diesel M.D.   On: 11/20/2019 10:20   MR BRAIN WO CONTRAST  Result  Date: 11/20/2019 CLINICAL DATA:  Off balance for several weeks. Multiple falls. Stroke, follow-up. EXAM: MRI HEAD WITHOUT CONTRAST TECHNIQUE: Multiplanar, multiecho pulse sequences of the brain and surrounding structures were obtained without intravenous contrast. COMPARISON:  CT head without contrast 11/20/2019 FINDINGS: Brain: The diffusion-weighted images demonstrate bilateral acute cortical infarction. An area of focal restricted diffusion is present in the right left postcentral gyrus and  corona radiata. A punctate cortical focus of restricted diffusion is present in the right parietal lobe on image 34 of series 4. Age advanced atrophy and white matter disease is present. A remote hemorrhagic lacunar infarct is present in the right caudate head. Remote nonhemorrhagic infarcts present in the thalami bilaterally. Diffuse confluent white matter disease is present bilaterally with multiple remote lacunar infarcts in the corona radiata bilaterally. The ventricles are proportionate to the degree of atrophy. Multiple remote nonhemorrhagic lacunar infarcts are present within the pons. Remote lacunar infarcts are present in the right cerebellum. Vascular: Flow is present in the major intracranial arteries. Skull and upper cervical spine: The craniocervical junction is normal. Upper cervical spine is within normal limits. Marrow signal is unremarkable. Sinuses/Orbits: There is some fluid in the right mastoid air cells. No obstructing nasopharyngeal lesion is present. Mild mucosal thickening is present within anterior ethmoid air cells bilaterally as well as the inferior left frontal sinus. No fluid levels are present. The paranasal sinuses and mastoid air cells are otherwise clear. The globes and orbits are within normal limits. IMPRESSION: 1. Acute/subacute bilateral cortical infarction involving the left postcentral gyrus and corona radiata. 2. Additional punctate cortical infarct in the right parietal lobe. 3. Age  advanced atrophy and white matter disease likely reflects the sequela of chronic microvascular ischemia. 4. Remote hemorrhagic lacunar infarcts of the right caudate head and right cerebellum. 5. Mild sinus disease as described. Electronically Signed   By: San Morelle M.D.   On: 11/20/2019 15:46    Pending Labs Unresulted Labs (From admission, onward)    Start     Ordered   11/21/19 0500  HIV Antibody (routine testing w rflx)  (HIV Antibody (Routine testing w reflex) panel)  Tomorrow morning,   R     11/20/19 1803   11/21/19 0500  Hemoglobin A1c  Tomorrow morning,   R     11/20/19 1803   11/21/19 0500  Lipid panel  Tomorrow morning,   R    Comments: Fasting    11/20/19 1803   11/21/19 0500  CBC with Differential/Platelet  Tomorrow morning,   R     11/20/19 1914   11/21/19 0500  Comprehensive metabolic panel  Tomorrow morning,   R     11/20/19 1914   11/21/19 0500  Magnesium  Tomorrow morning,   R     11/20/19 1914   11/20/19 1607  SARS CORONAVIRUS 2 (TAT 6-24 HRS) Nasopharyngeal Nasopharyngeal Swab  (Tier 3 (TAT 6-24 hrs))  Once,   STAT    Question Answer Comment  Is this test for diagnosis or screening Screening   Symptomatic for COVID-19 as defined by CDC No   Hospitalized for COVID-19 No   Admitted to ICU for COVID-19 No   Previously tested for COVID-19 No   Resident in a congregate (group) care setting No   Employed in healthcare setting No      11/20/19 1606   11/20/19 0950  Urine culture  ONCE - STAT,   STAT     11/20/19 0949          Vitals/Pain Today's Vitals   11/20/19 1330 11/20/19 1603 11/20/19 1904 11/20/19 1946  BP: (!) 188/105 (!) 182/105 (!) 170/99 (!) 170/104  Pulse: 84 81 79 74  Resp: 16 15 16 18   Temp:    98.9 F (37.2 C)  TempSrc:    Oral  SpO2: 99% 97% 99% 98%  Weight:      Height:  PainSc:   Asleep 0-No pain    Isolation Precautions No active isolations  Medications Medications   stroke: mapping our early stages of recovery  book (has no administration in time range)  acetaminophen (TYLENOL) tablet 650 mg (has no administration in time range)    Or  acetaminophen (TYLENOL) 160 MG/5ML solution 650 mg (has no administration in time range)    Or  acetaminophen (TYLENOL) suppository 650 mg (has no administration in time range)  senna-docusate (Senokot-S) tablet 1 tablet (has no administration in time range)  enoxaparin (LOVENOX) injection 40 mg (has no administration in time range)  aspirin suppository 300 mg (has no administration in time range)    Or  aspirin tablet 325 mg (has no administration in time range)  nicotine (NICODERM CQ - dosed in mg/24 hours) patch 14 mg (has no administration in time range)  potassium chloride SA (KLOR-CON) CR tablet 40 mEq (has no administration in time range)  sodium chloride 0.9 % bolus 1,000 mL (0 mLs Intravenous Stopped 11/20/19 1719)  potassium chloride SA (KLOR-CON) CR tablet 40 mEq (40 mEq Oral Given 11/20/19 1223)    Mobility non-ambulatory

## 2019-11-20 NOTE — ED Provider Notes (Signed)
[facility] DEPT Provider Note   CSN: [id number] Arrival date & time: 11/20/19  0818     History Chief Complaint  Patient presents with  . Balance Problems    Darren Pearson is a 53 y.o. male.  HPI      2 weeks of balance problems, has had about 3 falls. Constant unsteadiness with walking. No sensation while resting.  No focal numbness/weakness. Sometimes has trouble getting up off of the floor, feeling weak all over.  No change in vision, difficulty talking.    No other changes. No headache, neck pain, n/v/d/bloody stools.  No change in medications, not taking anything No etoh, no other drugs Smoking cigarettes   Itching rash on leg for one month   Past Medical History:  Diagnosis Date  . Allergy   . Anxiety and depression   . Asthma   . Hepatitis C   . History of chicken pox   . Hypertension   has not had rx for htn not sure of diagnosis  Patient Active Problem List   Diagnosis Date Noted  . Acute CVA (cerebrovascular accident) ([place]) 11/20/2019  . Hypokalemia 11/20/2019  . Accelerated hypertension 11/20/2019  . Hyperammonemia ([place]) 11/20/2019  . Elevated serum creatinine 11/20/2019  . Elevated blood pressure 03/15/2015  . Rash and nonspecific skin eruption 03/15/2015  . Hepatitis C 04/12/2013  . Allergic rhinitis 04/12/2013  . Unspecified asthma(493.90) 04/12/2013    History reviewed. No pertinent surgical history.     Family History  Problem Relation Age of Onset  . Stomach cancer Mother   . Diabetes Father   . Paget's disease of bone Father     Social History   Tobacco Use  . Smoking status: Current Some Day Smoker    Packs/day: 0.25    Types: Cigarettes    Start date: 11/24/2012  . Smokeless tobacco: Never Used  Substance Use Topics  . Alcohol use: Yes    Alcohol/week: 2.0 standard drinks    Types: 2 Cans of beer per week    Comment: social  . Drug use: Yes    Frequency: 5.0 times per week    Types:  Marijuana    Home Medications Prior to Admission medications   Medication Sig Start Date End Date Taking? Authorizing Provider  acetaminophen (TYLENOL) 325 MG tablet Take 650 mg by mouth every 6 (six) hours as needed for mild pain or headache.   Yes [provider]  polyvinyl alcohol (LIQUIFILM TEARS) 1.4 % ophthalmic solution Place 1 drop into both eyes as needed for dry eyes.   Yes [provider]  azithromycin (ZITHROMAX) 250 MG tablet Take 1 tablet (250 mg total) by mouth daily. Take first 2 tablets together, then 1 every day until finished. Patient not taking: Reported on 11/20/2019 08/13/16   Veryl Speak, MD  doxycycline (VIBRAMYCIN) 100 MG capsule Take 1 capsule (100 mg total) by mouth 2 (two) times daily. Patient not taking: Reported on 11/20/2019 05/14/16   Orpah Greek, MD  levofloxacin (LEVAQUIN) 500 MG tablet Take 1 tablet (500 mg total) by mouth daily. Patient not taking: Reported on 05/14/2016 11/02/15   Everlene Balls, MD  ondansetron (ZOFRAN ODT) 4 MG disintegrating tablet Take 1 tablet (4 mg total) by mouth every 8 (eight) hours as needed for nausea or vomiting. Patient not taking: Reported on 05/14/2016 02/04/15   Waynetta Pean, PA-C  oxyCODONE-acetaminophen (PERCOCET) 5-325 MG tablet Take 1-2 tablets by mouth every 4 (four) hours as needed. Patient  not taking: Reported on 11/20/2019 05/14/16   Orpah Greek, MD  predniSONE (DELTASONE) 10 MG tablet Take 2 tablets (20 mg total) by mouth 2 (two) times daily. Patient not taking: Reported on 11/20/2019 08/13/16   Veryl Speak, MD    Allergies    Sulfa antibiotics  Review of Systems   Review of Systems  Constitutional: Negative for fever.  HENT: Negative for sore throat.   Eyes: Negative for visual disturbance.  Respiratory: Negative for shortness of breath.   Cardiovascular: Negative for chest pain.  Gastrointestinal: Negative for abdominal pain.  Genitourinary: Negative for difficulty  urinating.  Musculoskeletal: Positive for gait problem. Negative for back pain and neck stiffness.  Skin: Negative for rash.  Neurological: Negative for syncope, facial asymmetry, weakness, light-headedness, numbness and headaches.    Physical Exam Updated Vital Signs BP (!) 186/113 (BP Location: Right Arm)   Pulse 75   Temp 97.6 F (36.4 C) (Oral)   Resp 17   Ht 5\' 8"  (1.727 m)   Wt 72.6 kg   SpO2 100%   BMI 24.33 kg/m   Physical Exam Vitals and nursing note reviewed.  Constitutional:      General: He is not in acute distress.    Appearance: He is well-developed. He is not diaphoretic.  HENT:     Head: Normocephalic and atraumatic.  Eyes:     General: No visual field deficit.    Conjunctiva/sclera: Conjunctivae normal.  Cardiovascular:     Rate and Rhythm: Normal rate and regular rhythm.     Heart sounds: Normal heart sounds.  Pulmonary:     Effort: Pulmonary effort is normal. No respiratory distress.     Breath sounds: Normal breath sounds.  Abdominal:     General: There is no distension.     Palpations: Abdomen is soft.     Tenderness: There is no abdominal tenderness. There is no guarding.  Musculoskeletal:     Cervical back: Normal range of motion.  Skin:    General: Skin is warm and dry.  Neurological:     Mental Status: He is alert and oriented to person, place, and time.     GCS: GCS eye subscore is 4. GCS verbal subscore is 5. GCS motor subscore is 6.     Cranial Nerves: Cranial nerves are intact. No cranial nerve deficit, dysarthria or facial asymmetry.     Sensory: Sensation is intact. No sensory deficit.     Motor: Motor function is intact. No pronator drift.     Coordination: Coordination is intact. Coordination normal.     Gait: Abnormal gait: deferred at this time but staff and pt report abnormal.     ED Results / Procedures / Treatments   Labs (all labs ordered are listed, but only abnormal results are displayed) Labs Reviewed  CBC WITH  DIFFERENTIAL/PLATELET - Abnormal; Notable for the following components:      Result Value   RBC 3.98 (*)    Hemoglobin 12.2 (*)    HCT 38.0 (*)    All other components within normal limits  COMPREHENSIVE METABOLIC PANEL - Abnormal; Notable for the following components:   Potassium 3.1 (*)    Creatinine, Ser 1.27 (*)    Calcium 8.8 (*)    All other components within normal limits  AMMONIA - Abnormal; Notable for the following components:   Ammonia 55 (*)    All other components within normal limits  URINE CULTURE  SARS CORONAVIRUS 2 (TAT 6-24 HRS)  URINALYSIS,  ROUTINE W REFLEX MICROSCOPIC  HIV ANTIBODY (ROUTINE TESTING W REFLEX)  HEMOGLOBIN A1C  LIPID PANEL  CBC WITH DIFFERENTIAL/PLATELET  COMPREHENSIVE METABOLIC PANEL  MAGNESIUM    EKG EKG Interpretation  Date/Time:  Sunday November 20 2019 08:37:55 EST Ventricular Rate:  86 PR Interval:    QRS Duration: 150 QT Interval:  425 QTC Calculation: 509 R Axis:   118 Text Interpretation: Sinus rhythm RBBB and LPFB No significant change since last tracing Confirmed by Gareth Morgan [phone]) on 11/20/2019 12:00:22 PM   Radiology DG Chest 2 View  Result Date: 11/20/2019 CLINICAL DATA:  Status post fall on left ribs. Shortness of breath. EXAM: CHEST - 2 VIEW COMPARISON:  August 12, 2016 FINDINGS: The heart size and mediastinal contours are within normal limits. Both lungs are clear. The lungs are hyperinflated. The visualized skeletal structures are unremarkable. The visualized left ribs demonstrate no acute fracture or dislocation. IMPRESSION: 1. No active cardiopulmonary disease.  COPD. 2. No acute fracture or dislocation of left ribs. Electronically Signed   By: Abelardo Diesel M.D.   On: 11/20/2019 10:03   CT Head Wo Contrast  Result Date: 11/20/2019 CLINICAL DATA:  Off balance for several weeks. Multiple falls. EXAM: CT HEAD WITHOUT CONTRAST TECHNIQUE: Contiguous axial images were obtained from the base of the skull  through the vertex without intravenous contrast. COMPARISON:  None. FINDINGS: Brain: No evidence of acute infarction, hemorrhage, hydrocephalus, extra-axial collection or mass lesion/mass effect. There is chronic diffuse atrophy. Chronic bilateral periventricular white matter small vessel ischemic changes are noted. Small old lacunar infarction is identified in the right basal ganglia. Vascular: No hyperdense vessel is noted. Skull: Normal. Negative for fracture or focal lesion. Sinuses/Orbits: No acute finding. Other: None. IMPRESSION: 1. No focal acute intracranial abnormality identified. 2. Chronic diffuse atrophy. Chronic bilateral periventricular white matter small vessel ischemic change. Electronically Signed   By: Abelardo Diesel M.D.   On: 11/20/2019 10:20   MR BRAIN WO CONTRAST  Result Date: 11/20/2019 CLINICAL DATA:  Off balance for several weeks. Multiple falls. Stroke, follow-up. EXAM: MRI HEAD WITHOUT CONTRAST TECHNIQUE: Multiplanar, multiecho pulse sequences of the brain and surrounding structures were obtained without intravenous contrast. COMPARISON:  CT head without contrast 11/20/2019 FINDINGS: Brain: The diffusion-weighted images demonstrate bilateral acute cortical infarction. An area of focal restricted diffusion is present in the right left postcentral gyrus and corona radiata. A punctate cortical focus of restricted diffusion is present in the right parietal lobe on image 34 of series 4. Age advanced atrophy and white matter disease is present. A remote hemorrhagic lacunar infarct is present in the right caudate head. Remote nonhemorrhagic infarcts present in the thalami bilaterally. Diffuse confluent white matter disease is present bilaterally with multiple remote lacunar infarcts in the corona radiata bilaterally. The ventricles are proportionate to the degree of atrophy. Multiple remote nonhemorrhagic lacunar infarcts are present within the pons. Remote lacunar infarcts are present in  the right cerebellum. Vascular: Flow is present in the major intracranial arteries. Skull and upper cervical spine: The craniocervical junction is normal. Upper cervical spine is within normal limits. Marrow signal is unremarkable. Sinuses/Orbits: There is some fluid in the right mastoid air cells. No obstructing nasopharyngeal lesion is present. Mild mucosal thickening is present within anterior ethmoid air cells bilaterally as well as the inferior left frontal sinus. No fluid levels are present. The paranasal sinuses and mastoid air cells are otherwise clear. The globes and orbits are within normal limits. IMPRESSION: 1. Acute/subacute bilateral cortical infarction involving  the left postcentral gyrus and corona radiata. 2. Additional punctate cortical infarct in the right parietal lobe. 3. Age advanced atrophy and white matter disease likely reflects the sequela of chronic microvascular ischemia. 4. Remote hemorrhagic lacunar infarcts of the right caudate head and right cerebellum. 5. Mild sinus disease as described. Electronically Signed   By: San Morelle M.D.   On: 11/20/2019 15:46    Procedures Procedures (including critical care time)  Medications Ordered in ED Medications   stroke: mapping our early stages of recovery book (has no administration in time range)  acetaminophen (TYLENOL) tablet 650 mg (has no administration in time range)    Or  acetaminophen (TYLENOL) 160 MG/5ML solution 650 mg (has no administration in time range)    Or  acetaminophen (TYLENOL) suppository 650 mg (has no administration in time range)  senna-docusate (Senokot-S) tablet 1 tablet (has no administration in time range)  enoxaparin (LOVENOX) injection 40 mg (40 mg Subcutaneous Given 11/20/19 2229)  aspirin suppository 300 mg (has no administration in time range)    Or  aspirin tablet 325 mg (has no administration in time range)  nicotine (NICODERM CQ - dosed in mg/24 hours) patch 14 mg (has no  administration in time range)  sodium chloride 0.9 % bolus 1,000 mL (0 mLs Intravenous Stopped 11/20/19 1719)  potassium chloride SA (KLOR-CON) CR tablet 40 mEq (40 mEq Oral Given 11/20/19 1223)  potassium chloride SA (KLOR-CON) CR tablet 40 mEq (40 mEq Oral Given 11/20/19 2137)    ED Course  I have reviewed the triage vital signs and the nursing notes.  Pertinent labs & imaging results that were available during my care of the patient were reviewed by me and considered in my medical decision making (see chart for details).    MDM Rules/Calculators/A&P                      53yo male with history of smoking, possible hypertension (not on medications, denis diagnosis) , who presents with difficulty walking.  CT without acute findings. XR without acute findings. Labs without acute findings to explain symptoms  MRI pending at time of transfer of care to Dr. Regenia Skeeter.to evalluate for CVA or other acute intracranial findings.   Final Clinical Impression(s) / ED Diagnoses Final diagnoses:  Cerebrovascular accident (CVA), unspecified mechanism ([place])    Rx / DC Orders ED Discharge Orders    None       Gareth Morgan, MD 11/20/19 [phone]

## 2019-11-20 NOTE — ED Notes (Signed)
[facility] picking up patient. 

## 2019-11-20 NOTE — ED Notes (Signed)
Patient transport to MRI via stretcher.

## 2019-11-20 NOTE — ED Provider Notes (Signed)
Care transferred to me. Patient's MRI shows acute/subacute infarcts. Discussed with Dr. Leonel Ramsay, neuro will see in consultation. Will admit to hospitalist service. Well outside of TPA/intervention window.   Sherwood Gambler, MD 11/20/19 [phone]

## 2019-11-20 NOTE — H&P (Signed)
[facility] History and Physical   Patient: Darren Pearson [id number]   PCP: Jearld Fenton, NP DOB: Aug 30, 1966   DOA: 11/20/2019   DOS: 11/20/2019   DOS: the patient was seen and examined on 11/20/2019  Patient coming from: The patient is coming from Home  Chief Complaint: Fall  HPI: Darren Pearson is a 53 y.o. male with Past medical history of hypertension, hep C. Patient presented with a fall. Patient reports that he has been having balance issues for last 10 days and has fallen multiple times.  Sometimes his legs give up as well. At the time of my evaluation denies any having any headache nausea vomiting chest pain abdominal pain no dizziness no lightheadedness.  No focal deficit reported by the patient. Patient reports he is smoking cigarettes half pack a day but does not drink alcohol.  Does not abuse medicines or drugs. Does not take any medications at his baseline.  ED Course: Presents with above complaint. Work-up showed that the patient actually has subacute stroke.  Neurology was consulted and recommended patient to be transferred to [facility].  Patient is outside of the [facility] intervention.  At his baseline ambulates without assistance independent for most of his ADL;  manages his medication on his own.  Review of Systems: as mentioned in the history of present illness.  All other systems reviewed and are negative.  Past Medical History:  Diagnosis Date  . Allergy   . Anxiety and depression   . Asthma   . Hepatitis C   . History of chicken pox   . Hypertension    History reviewed. No pertinent surgical history. Social History:  reports that he has been smoking cigarettes. He started smoking about 6 years ago. He has been smoking about 0.25 packs per day. He has never used smokeless tobacco. He reports current alcohol use of about 2.0 standard drinks of alcohol per week. He reports current drug use. Frequency: 5.00 times per week. Drug:  Marijuana.  Allergies  Allergen Reactions  . Sulfa Antibiotics Hives    Family history reviewed and not pertinent Family History  Problem Relation Age of Onset  . Stomach cancer Mother   . Diabetes Father   . Paget's disease of bone Father      Prior to Admission medications   Medication Sig Start Date End Date Taking? Authorizing Provider  acetaminophen (TYLENOL) 325 MG tablet Take 650 mg by mouth every 6 (six) hours as needed for mild pain or headache.   Yes [provider]  polyvinyl alcohol (LIQUIFILM TEARS) 1.4 % ophthalmic solution Place 1 drop into both eyes as needed for dry eyes.   Yes [provider]  azithromycin (ZITHROMAX) 250 MG tablet Take 1 tablet (250 mg total) by mouth daily. Take first 2 tablets together, then 1 every day until finished. Patient not taking: Reported on 11/20/2019 08/13/16   Veryl Speak, MD  doxycycline (VIBRAMYCIN) 100 MG capsule Take 1 capsule (100 mg total) by mouth 2 (two) times daily. Patient not taking: Reported on 11/20/2019 05/14/16   Orpah Greek, MD  levofloxacin (LEVAQUIN) 500 MG tablet Take 1 tablet (500 mg total) by mouth daily. Patient not taking: Reported on 05/14/2016 11/02/15   Everlene Balls, MD  ondansetron (ZOFRAN ODT) 4 MG disintegrating tablet Take 1 tablet (4 mg total) by mouth every 8 (eight) hours as needed for nausea or vomiting. Patient not taking: Reported on 05/14/2016 02/04/15   Waynetta Pean, PA-C  oxyCODONE-acetaminophen [facility])  5-325 MG tablet Take 1-2 tablets by mouth every 4 (four) hours as needed. Patient not taking: Reported on 11/20/2019 05/14/16   Orpah Greek, MD  predniSONE (DELTASONE) 10 MG tablet Take 2 tablets (20 mg total) by mouth 2 (two) times daily. Patient not taking: Reported on 11/20/2019 08/13/16   Veryl Speak, MD    Physical Exam: Vitals:   11/20/19 1130 11/20/19 1200 11/20/19 1330 11/20/19 1603  BP: (!) 193/112 (!) 186/118 (!) 188/105 (!) 182/105  Pulse:  78 87 84 81  Resp: 16 15 16 15   Temp:      TempSrc:      SpO2: 100% 100% 99% 97%  Weight:      Height:        General: alert and oriented to time, place, and person. Appear in moderate distress, affect appropriate Eyes: PERRL, Conjunctiva normal ENT: Oral Mucosa Clear, moist  Neck: no JVD, no Abnormal Mass Or lumps Cardiovascular: S1 and S2 Present, no Murmur, peripheral pulses symmetrical Respiratory: good respiratory effort, Bilateral Air entry equal and Decreased, no signs of accessory muscle use, Clear to Auscultation, no Crackles, no wheezes Abdomen: Bowel Sound present, Soft and no tenderness, no hernia Skin: no rashes  Extremities: no Pedal edema, no calf tenderness Neurologic: without any new focal findings Gait not checked due to patient safety concerns  Data Reviewed: I have personally reviewed and interpreted labs, imaging as discussed below.  CBC: Recent Labs  Lab 11/20/19 1006  WBC 6.4  NEUTROABS 4.4  HGB 12.2*  HCT 38.0*  MCV 95.5  PLT [id number]   Basic Metabolic Panel: Recent Labs  Lab 11/20/19 1006  NA 140  K 3.1*  CL 104  CO2 26  GLUCOSE 91  BUN 13  CREATININE 1.27*  CALCIUM 8.8*   GFR: Estimated Creatinine Clearance: 64.3 mL/min (A) (by C-G formula based on SCr of 1.27 mg/dL (H)). Liver Function Tests: Recent Labs  Lab 11/20/19 1006  AST 24  ALT 13  ALKPHOS 89  BILITOT 1.0  PROT 8.0  ALBUMIN 3.7   No results for input(s): LIPASE, AMYLASE in the last 168 hours. Recent Labs  Lab 11/20/19 1006  AMMONIA 55*   Coagulation Profile: No results for input(s): INR, PROTIME in the last 168 hours. Cardiac Enzymes: No results for input(s): CKTOTAL, CKMB, CKMBINDEX, TROPONINI in the last 168 hours. BNP (last 3 results) No results for input(s): PROBNP in the last 8760 hours. HbA1C: No results for input(s): HGBA1C in the last 72 hours. CBG: No results for input(s): GLUCAP in the last 168 hours. Lipid Profile: No results for input(s): CHOL,  HDL, LDLCALC, TRIG, CHOLHDL, LDLDIRECT in the last 72 hours. Thyroid Function Tests: No results for input(s): TSH, T4TOTAL, FREET4, T3FREE, THYROIDAB in the last 72 hours. Anemia Panel: No results for input(s): VITAMINB12, FOLATE, FERRITIN, TIBC, IRON, RETICCTPCT in the last 72 hours. Urine analysis:    Component Value Date/Time   COLORURINE YELLOW 11/20/2019 1006   APPEARANCEUR CLEAR 11/20/2019 1006   LABSPEC 1.013 11/20/2019 1006   PHURINE 7.0 11/20/2019 1006   GLUCOSEU NEGATIVE 11/20/2019 1006   HGBUR NEGATIVE 11/20/2019 1006   BILIRUBINUR NEGATIVE 11/20/2019 1006   BILIRUBINUR neg 05/30/2014 1538   KETONESUR NEGATIVE 11/20/2019 1006   PROTEINUR NEGATIVE 11/20/2019 1006   UROBILINOGEN >=8.0 05/30/2014 1538   NITRITE NEGATIVE 11/20/2019 1006   LEUKOCYTESUR NEGATIVE 11/20/2019 1006    Radiological Exams on Admission: DG Chest 2 View  Result Date: 11/20/2019 CLINICAL DATA:  Status post fall on left  ribs. Shortness of breath. EXAM: CHEST - 2 VIEW COMPARISON:  August 12, 2016 FINDINGS: The heart size and mediastinal contours are within normal limits. Both lungs are clear. The lungs are hyperinflated. The visualized skeletal structures are unremarkable. The visualized left ribs demonstrate no acute fracture or dislocation. IMPRESSION: 1. No active cardiopulmonary disease.  COPD. 2. No acute fracture or dislocation of left ribs. Electronically Signed   By: Abelardo Diesel M.D.   On: 11/20/2019 10:03   CT Head Wo Contrast  Result Date: 11/20/2019 CLINICAL DATA:  Off balance for several weeks. Multiple falls. EXAM: CT HEAD WITHOUT CONTRAST TECHNIQUE: Contiguous axial images were obtained from the base of the skull through the vertex without intravenous contrast. COMPARISON:  None. FINDINGS: Brain: No evidence of acute infarction, hemorrhage, hydrocephalus, extra-axial collection or mass lesion/mass effect. There is chronic diffuse atrophy. Chronic bilateral periventricular white matter  small vessel ischemic changes are noted. Small old lacunar infarction is identified in the right basal ganglia. Vascular: No hyperdense vessel is noted. Skull: Normal. Negative for fracture or focal lesion. Sinuses/Orbits: No acute finding. Other: None. IMPRESSION: 1. No focal acute intracranial abnormality identified. 2. Chronic diffuse atrophy. Chronic bilateral periventricular white matter small vessel ischemic change. Electronically Signed   By: Abelardo Diesel M.D.   On: 11/20/2019 10:20   MR BRAIN WO CONTRAST  Result Date: 11/20/2019 CLINICAL DATA:  Off balance for several weeks. Multiple falls. Stroke, follow-up. EXAM: MRI HEAD WITHOUT CONTRAST TECHNIQUE: Multiplanar, multiecho pulse sequences of the brain and surrounding structures were obtained without intravenous contrast. COMPARISON:  CT head without contrast 11/20/2019 FINDINGS: Brain: The diffusion-weighted images demonstrate bilateral acute cortical infarction. An area of focal restricted diffusion is present in the right left postcentral gyrus and corona radiata. A punctate cortical focus of restricted diffusion is present in the right parietal lobe on image 34 of series 4. Age advanced atrophy and white matter disease is present. A remote hemorrhagic lacunar infarct is present in the right caudate head. Remote nonhemorrhagic infarcts present in the thalami bilaterally. Diffuse confluent white matter disease is present bilaterally with multiple remote lacunar infarcts in the corona radiata bilaterally. The ventricles are proportionate to the degree of atrophy. Multiple remote nonhemorrhagic lacunar infarcts are present within the pons. Remote lacunar infarcts are present in the right cerebellum. Vascular: Flow is present in the major intracranial arteries. Skull and upper cervical spine: The craniocervical junction is normal. Upper cervical spine is within normal limits. Marrow signal is unremarkable. Sinuses/Orbits: There is some fluid in the  right mastoid air cells. No obstructing nasopharyngeal lesion is present. Mild mucosal thickening is present within anterior ethmoid air cells bilaterally as well as the inferior left frontal sinus. No fluid levels are present. The paranasal sinuses and mastoid air cells are otherwise clear. The globes and orbits are within normal limits. IMPRESSION: 1. Acute/subacute bilateral cortical infarction involving the left postcentral gyrus and corona radiata. 2. Additional punctate cortical infarct in the right parietal lobe. 3. Age advanced atrophy and white matter disease likely reflects the sequela of chronic microvascular ischemia. 4. Remote hemorrhagic lacunar infarcts of the right caudate head and right cerebellum. 5. Mild sinus disease as described. Electronically Signed   By: San Morelle M.D.   On: 11/20/2019 15:46   EKG: Independently reviewed. normal sinus rhythm, nonspecific ST and T waves changes. Echocardiogram: Ordered on 11/20/2019  I reviewed all nursing notes, pharmacy notes, vitals, pertinent old records.  Assessment/Plan 1.  Acute CVA MRI shows acute to subacute  bilateral cortical infarction involving left postcentral gyrus as well as corona radiata. Also punctate cortical infarct in the right parietal lobe. Suspect this is embolic in nature. Neurology consulted. Patient will be transferred to [facility] for further work-up. Aspirin suppository. Speech therapy eval PT OT eval. Carotid Doppler ordered. Patient may probably benefit from CTA. May end up requiring TEE and 30 Day Loop monitoring.  2.  Essential hypertension Blood pressure currently elevated pretension allowing permissive hypertension. Monitor.  3.  Hypokalemia. We will replace.  4.  Elevated serum creatinine. Likely chronic at baseline. Monitor for now.  5.  Elevated ammonia level. Does not have any asterixis on exam. Does not drink alcohol. Monitor for now.  6.  Tobacco smoker. Patient  was counseled to quit smoking in the setting of acute stroke. Patient currently agreeable. Nicotine patch.  Nutrition: NPO pending swallow eval DVT Prophylaxis: Subcutaneous Lovenox  Advance goals of care discussion: Full code   Consults: ED discussed with neurology.  Family Communication: no family was present at bedside, at the time of interview.  Disposition: Admitted as inpatient, telemetryunit. Likely to be discharged be determined .  I have discussed plan of care as described above with RN and patient/family.  Author: Berle Mull, MD [facility] 11/20/2019 7:03 PM   To reach On-call, see care teams to locate the attending and reach out to them via [email or web address]. If 7PM-7AM, please contact night-coverage If you still have difficulty reaching the attending provider, please page the [employer] (Director on Call) for [facility] on amion for assistance.

## 2019-11-20 NOTE — Consult Note (Signed)
Referring Physician: Dr. Posey Pronto    Chief Complaint: Fall secondary to BLE weakness  HPI: Darren Pearson is an 53 y.o. male with a PMHx of hepatitis C and HTN who presented to the ED for assessment of poor balance for 1.5 weeks, having fallen approximately 3 times. He denied limb weakness, limb numbness, dysphasia, vision changes, headache, neck pain, dizziness, N/V or urinary symptoms on presentation. He endorsed mild EtOH use, smoking tobacco and use of marijuana. The unsteadiness sensation was present only when walking and would resolve at rest.   CT head showed chronic diffuse atrophy and chronic bilateral periventricular white matter small vessel ischemic change in addition to a remote left pontine ischemic infarction.   MRI brain revealed acute/subacute punctate bilateral cortical infarctions involving the right postcentral gyrus and left corona radiata, as well as an equivocal focus of restricted diffusion in the medial right pons suggestive of acute infarction vs T2-shine through artifact. Also noted are remote hemorrhagic lacunar infarcts of the right caudate head and right cerebellum. Chronic small vessel ischemic changes and mild atrophy also noted.   LSN: 2 weeks previously tPA Given: No: Out of time window.   Past Medical History:  Diagnosis Date  . Allergy   . Anxiety and depression   . Asthma   . Hepatitis C   . History of chicken pox   . Hypertension     History reviewed. No pertinent surgical history.  Family History  Problem Relation Age of Onset  . Stomach cancer Mother   . Diabetes Father   . Paget's disease of bone Father    Social History:  reports that he has been smoking cigarettes. He started smoking about 6 years ago. He has been smoking about 0.25 packs per day. He has never used smokeless tobacco. He reports current alcohol use of about 2.0 standard drinks of alcohol per week. He reports current drug use. Frequency: 5.00 times per week. Drug:  Marijuana.  Allergies:  Allergies  Allergen Reactions  . Sulfa Antibiotics Hives    Home Medications:  No current facility-administered medications on file prior to encounter.   Current Outpatient Medications on File Prior to Encounter  Medication Sig Dispense Refill  . acetaminophen (TYLENOL) 325 MG tablet Take 650 mg by mouth every 6 (six) hours as needed for mild pain or headache.    . polyvinyl alcohol (LIQUIFILM TEARS) 1.4 % ophthalmic solution Place 1 drop into both eyes as needed for dry eyes.    Marland Kitchen azithromycin (ZITHROMAX) 250 MG tablet Take 1 tablet (250 mg total) by mouth daily. Take first 2 tablets together, then 1 every day until finished. (Patient not taking: Reported on 11/20/2019) 6 tablet 0  . doxycycline (VIBRAMYCIN) 100 MG capsule Take 1 capsule (100 mg total) by mouth 2 (two) times daily. (Patient not taking: Reported on 11/20/2019) 20 capsule 0  . levofloxacin (LEVAQUIN) 500 MG tablet Take 1 tablet (500 mg total) by mouth daily. (Patient not taking: Reported on 05/14/2016) 9 tablet 0  . ondansetron (ZOFRAN ODT) 4 MG disintegrating tablet Take 1 tablet (4 mg total) by mouth every 8 (eight) hours as needed for nausea or vomiting. (Patient not taking: Reported on 05/14/2016) 10 tablet 0  . oxyCODONE-acetaminophen (PERCOCET) 5-325 MG tablet Take 1-2 tablets by mouth every 4 (four) hours as needed. (Patient not taking: Reported on 11/20/2019) 10 tablet 0  . predniSONE (DELTASONE) 10 MG tablet Take 2 tablets (20 mg total) by mouth 2 (two) times daily. (Patient not taking: Reported  on 11/20/2019) 20 tablet 0    ROS: As per HPI. Does not endorse additional symptoms.   Physical Examination: Blood pressure (!) 170/99, pulse 79, temperature 98.7 F (37.1 C), temperature source Oral, resp. rate 16, height 5\' 8"  (1.727 m), weight 72.6 kg, SpO2 99 %.  HEENT: Old traumatic injury apparent along lateral aspect of right brow ridge Lungs: Respirations unlabored Ext: No  edema  Neurologic Examination: Mental Status:  Alert, oriented, thought content appropriate.  Speech fluent without evidence of aphasia.  Able to follow all commands without difficulty. Cranial Nerves: II:  Visual fields intact. PERRL.  III,IV, VI: No ptosis. EOMI. No nystagmus.  V,VII: Smile symmetric, facial temp sensation equal bilaterally VIII: hearing intact to voice IX,X: No hypophonia XI: Delay on the left with shoulder shrug XII: Midline tongue extension  Motor: RUE and RLE 5/5 LUE 4+/5 with pronator drift noted. Also with decreased movement on orbiting fingers tes.  LLE: 4+/5 hip flexion, knee extension and knee flexion. 5/5 ADF.  Sensory: Temp and light touch intact throughout, bilaterally. No extinction to DSS.  Deep Tendon Reflexes:  2+ bilateral brachioradialis and biceps. 3+ patellae bilaterally.  Plantars: Right: downgoing   Left: downgoing Cerebellar: Positive for ataxia with FNF and H-S on the left.  Gait: Deferred  Results for orders placed or performed during the hospital encounter of 11/20/19 (from the past 48 hour(s))  CBC with Differential     Status: Abnormal   Collection Time: 11/20/19 10:06 AM  Result Value Ref Range   WBC 6.4 4.0 - 10.5 K/uL   RBC 3.98 (L) 4.22 - 5.81 MIL/uL   Hemoglobin 12.2 (L) 13.0 - 17.0 g/dL   HCT 38.0 (L) 39.0 - 52.0 %   MCV 95.5 80.0 - 100.0 fL   MCH 30.7 26.0 - 34.0 pg   MCHC 32.1 30.0 - 36.0 g/dL   RDW 12.7 11.5 - 15.5 %   Platelets 232 150 - 400 K/uL   nRBC 0.0 0.0 - 0.2 %   Neutrophils Relative % 68 %   Neutro Abs 4.4 1.7 - 7.7 K/uL   Lymphocytes Relative 20 %   Lymphs Abs 1.3 0.7 - 4.0 K/uL   Monocytes Relative 7 %   Monocytes Absolute 0.4 0.1 - 1.0 K/uL   Eosinophils Relative 4 %   Eosinophils Absolute 0.3 0.0 - 0.5 K/uL   Basophils Relative 1 %   Basophils Absolute 0.0 0.0 - 0.1 K/uL   Immature Granulocytes 0 %   Abs Immature Granulocytes 0.02 0.00 - 0.07 K/uL    Comment: Performed at [facility], [place] [address], [place], [place] [address]  Comprehensive metabolic panel     Status: Abnormal   Collection Time: 11/20/19 10:06 AM  Result Value Ref Range   Sodium 140 135 - 145 mmol/L   Potassium 3.1 (L) 3.5 - 5.1 mmol/L   Chloride 104 98 - 111 mmol/L   CO2 26 22 - 32 mmol/L   Glucose, Bld 91 70 - 99 mg/dL   BUN 13 6 - 20 mg/dL   Creatinine, Ser 1.27 (H) 0.61 - 1.24 mg/dL   Calcium 8.8 (L) 8.9 - 10.3 mg/dL   Total Protein 8.0 6.5 - 8.1 g/dL   Albumin 3.7 3.5 - 5.0 g/dL   AST 24 15 - 41 U/L   ALT 13 0 - 44 U/L   Alkaline Phosphatase 89 38 - 126 U/L   Total Bilirubin 1.0 0.3 - 1.2 mg/dL   GFR calc non  Af Amer >60 >60 mL/min   GFR calc Af Amer >60 >60 mL/min   Anion gap 10 5 - 15    Comment: Performed at [facility], [place] [address] [place], [place] [address]  Ammonia     Status: Abnormal   Collection Time: 11/20/19 10:06 AM  Result Value Ref Range   Ammonia 55 (H) 9 - 35 umol/L    Comment: Performed at [facility], [place] [address], [place], [place] [address]  Urinalysis, Routine w reflex microscopic     Status: None   Collection Time: 11/20/19 10:06 AM  Result Value Ref Range   Color, Urine YELLOW YELLOW   APPearance CLEAR CLEAR   Specific Gravity, Urine 1.013 1.005 - 1.030   pH 7.0 5.0 - 8.0   Glucose, UA NEGATIVE NEGATIVE mg/dL   Hgb urine dipstick NEGATIVE NEGATIVE   Bilirubin Urine NEGATIVE NEGATIVE   Ketones, ur NEGATIVE NEGATIVE mg/dL   Protein, ur NEGATIVE NEGATIVE mg/dL   Nitrite NEGATIVE NEGATIVE   Leukocytes,Ua NEGATIVE NEGATIVE    Comment: Performed at [facility] [address] [place], [place] [address]   DG Chest 2 View  Result Date: 11/20/2019 CLINICAL DATA:  Status post fall on left ribs. Shortness of breath. EXAM: CHEST - 2 VIEW COMPARISON:  August 12, 2016 FINDINGS: The heart size and mediastinal contours are within normal limits. Both lungs are clear. The lungs are  hyperinflated. The visualized skeletal structures are unremarkable. The visualized left ribs demonstrate no acute fracture or dislocation. IMPRESSION: 1. No active cardiopulmonary disease.  COPD. 2. No acute fracture or dislocation of left ribs. Electronically Signed   By: Abelardo Diesel M.D.   On: 11/20/2019 10:03   CT Head Wo Contrast  Result Date: 11/20/2019 CLINICAL DATA:  Off balance for several weeks. Multiple falls. EXAM: CT HEAD WITHOUT CONTRAST TECHNIQUE: Contiguous axial images were obtained from the base of the skull through the vertex without intravenous contrast. COMPARISON:  None. FINDINGS: Brain: No evidence of acute infarction, hemorrhage, hydrocephalus, extra-axial collection or mass lesion/mass effect. There is chronic diffuse atrophy. Chronic bilateral periventricular white matter small vessel ischemic changes are noted. Small old lacunar infarction is identified in the right basal ganglia. Vascular: No hyperdense vessel is noted. Skull: Normal. Negative for fracture or focal lesion. Sinuses/Orbits: No acute finding. Other: None. IMPRESSION: 1. No focal acute intracranial abnormality identified. 2. Chronic diffuse atrophy. Chronic bilateral periventricular white matter small vessel ischemic change. Electronically Signed   By: Abelardo Diesel M.D.   On: 11/20/2019 10:20   MR BRAIN WO CONTRAST  Result Date: 11/20/2019 CLINICAL DATA:  Off balance for several weeks. Multiple falls. Stroke, follow-up. EXAM: MRI HEAD WITHOUT CONTRAST TECHNIQUE: Multiplanar, multiecho pulse sequences of the brain and surrounding structures were obtained without intravenous contrast. COMPARISON:  CT head without contrast 11/20/2019 FINDINGS: Brain: The diffusion-weighted images demonstrate bilateral acute cortical infarction. An area of focal restricted diffusion is present in the right left postcentral gyrus and corona radiata. A punctate cortical focus of restricted diffusion is present in the right parietal lobe  on image 34 of series 4. Age advanced atrophy and white matter disease is present. A remote hemorrhagic lacunar infarct is present in the right caudate head. Remote nonhemorrhagic infarcts present in the thalami bilaterally. Diffuse confluent white matter disease is present bilaterally with multiple remote lacunar infarcts in the corona radiata bilaterally. The ventricles are proportionate to the degree of atrophy. Multiple remote nonhemorrhagic lacunar infarcts are present  within the pons. Remote lacunar infarcts are present in the right cerebellum. Vascular: Flow is present in the major intracranial arteries. Skull and upper cervical spine: The craniocervical junction is normal. Upper cervical spine is within normal limits. Marrow signal is unremarkable. Sinuses/Orbits: There is some fluid in the right mastoid air cells. No obstructing nasopharyngeal lesion is present. Mild mucosal thickening is present within anterior ethmoid air cells bilaterally as well as the inferior left frontal sinus. No fluid levels are present. The paranasal sinuses and mastoid air cells are otherwise clear. The globes and orbits are within normal limits. IMPRESSION: 1. Acute/subacute bilateral cortical infarction involving the left postcentral gyrus and corona radiata. 2. Additional punctate cortical infarct in the right parietal lobe. 3. Age advanced atrophy and white matter disease likely reflects the sequela of chronic microvascular ischemia. 4. Remote hemorrhagic lacunar infarcts of the right caudate head and right cerebellum. 5. Mild sinus disease as described. Electronically Signed   By: San Morelle M.D.   On: 11/20/2019 15:46    Assessment: 53 y.o. male presenting with falls due to left > right lower extremity weakness. MRI reveals small strokes in separate vascular territories.  1. Exam reveals left sided weakness with appendicular ataxia. The latter finding is most likely secondary to the small acute right pontine  stroke not mentioned on the official Radiology report.  2. MRI brain reveals acute/subacute punctate bilateral cortical infarctions involving the right postcentral gyrus and left corona radiata (as well as an equivocal focus of restricted diffusion in the medial right pons noted on review of the images by Neurology). Also noted are remote hemorrhagic lacunar infarcts of the right caudate head and right cerebellum. Chronic small vessel ischemic changes and mild atrophy also noted. 3. Most likely mechanism of stroke is cardioembolic, given acute strokes in separate vascular territories.   4. Stroke Risk Factors - HTN and smoking  Recommendations: 1. HgbA1c, fasting lipid panel 2. CTA of head and neck.  3. TTE. May need TEE if negative.  4. Cardiac telemetry. If telemetry is unrevealing, may need loop recorder as outpatient. 5. PT consult, OT consult, Speech consult 6. Agree with starting ASA 7. Agree with obtaining carotid ultrasound.  8. Risk factor modification to include smoking cessation counseling.  9. Frequent neuro checks 10. BP management. Out of permissive HTN time window.  11. Start atorvastatin. Obtain baseline CK level.  12. Agree with obtaining hypercoagulable panel.  13. I have encouraged the patient to start keeping a BP diary and to stop smoking.    @Electronically  signed: Dr. Kerney Elbe  11/20/2019, 7:28 PM

## 2019-11-20 NOTE — ED Notes (Signed)
Attempted to call report to [id number] [facility] RN unable to take at report at this time.

## 2019-11-20 NOTE — ED Triage Notes (Signed)
Pt states that he has been off balance for 1.5 weeks and has fallen multiple times. Pt denies any dizziness, headache, N/V/D, urinary issues. Pt denies any drug or alcohol use.

## 2019-11-20 NOTE — ED Notes (Signed)
Patient given a sandwich, cheese stick and a drink.

## 2019-11-20 NOTE — ED Notes (Signed)
Patient arrived back from MRI via stretcher.

## 2019-11-21 ENCOUNTER — Inpatient Hospital Stay (HOSPITAL_COMMUNITY): Payer: Medicaid Other

## 2019-11-21 DIAGNOSIS — E876 Hypokalemia: Secondary | ICD-10-CM

## 2019-11-21 DIAGNOSIS — N179 Acute kidney failure, unspecified: Secondary | ICD-10-CM

## 2019-11-21 DIAGNOSIS — E7220 Disorder of urea cycle metabolism, unspecified: Secondary | ICD-10-CM

## 2019-11-21 DIAGNOSIS — I6389 Other cerebral infarction: Secondary | ICD-10-CM

## 2019-11-21 DIAGNOSIS — I10 Essential (primary) hypertension: Secondary | ICD-10-CM

## 2019-11-21 DIAGNOSIS — I2699 Other pulmonary embolism without acute cor pulmonale: Secondary | ICD-10-CM

## 2019-11-21 LAB — BASIC METABOLIC PANEL
Anion gap: 7 (ref 5–15)
BUN: 10 mg/dL (ref 6–20)
CO2: 27 mmol/L (ref 22–32)
Calcium: 8.7 mg/dL — ABNORMAL LOW (ref 8.9–10.3)
Chloride: 106 mmol/L (ref 98–111)
Creatinine, Ser: 1.19 mg/dL (ref 0.61–1.24)
GFR calc Af Amer: >60 mL/min (ref >60)
GFR calc non Af Amer: >60 mL/min (ref >60)
Glucose, Bld: 95 mg/dL (ref 70–99)
Potassium: 3.7 mmol/L (ref 3.5–5.1)
Sodium: 140 mmol/L (ref 135–145)

## 2019-11-21 LAB — RAPID URINE DRUG SCREEN, HOSP PERFORMED
Amphetamines: NOT DETECTED
Barbiturates: NOT DETECTED
Benzodiazepines: NOT DETECTED
Cocaine: NOT DETECTED
Opiates: NOT DETECTED
Tetrahydrocannabinol: NOT DETECTED

## 2019-11-21 LAB — URINE CULTURE: Culture: NO GROWTH

## 2019-11-21 LAB — ECHOCARDIOGRAM COMPLETE
Height: 68 in
Weight: 2560 oz

## 2019-11-21 LAB — TSH: TSH: 1.345 u[IU]/mL (ref 0.350–4.500)

## 2019-11-21 LAB — SARS CORONAVIRUS 2 (TAT 6-24 HRS): SARS Coronavirus 2: NEGATIVE

## 2019-11-21 LAB — VITAMIN B12: Vitamin B-12: 170 pg/mL — ABNORMAL LOW (ref 180–914)

## 2019-11-21 MED ORDER — ATORVASTATIN CALCIUM 40 MG PO TABS
40.0000 mg | ORAL_TABLET | Freq: Every day | ORAL | Status: DC
  Administered 2019-11-21 – 2019-11-22 (×2): 40 mg via ORAL
  Filled 2019-11-21 (×2): qty 1

## 2019-11-21 MED ORDER — CLOPIDOGREL BISULFATE 75 MG PO TABS
75.0000 mg | ORAL_TABLET | Freq: Every day | ORAL | Status: DC
  Administered 2019-11-21 – 2019-11-23 (×3): 75 mg via ORAL
  Filled 2019-11-21 (×3): qty 1

## 2019-11-21 MED ORDER — IOHEXOL 350 MG/ML SOLN
75.0000 mL | Freq: Once | INTRAVENOUS | Status: AC | PRN
  Administered 2019-11-21: 75 mL via INTRAVENOUS

## 2019-11-21 MED ORDER — ASPIRIN EC 81 MG PO TBEC
81.0000 mg | DELAYED_RELEASE_TABLET | Freq: Every day | ORAL | Status: DC
  Administered 2019-11-21 – 2019-11-23 (×3): 81 mg via ORAL
  Filled 2019-11-21 (×3): qty 1

## 2019-11-21 MED ORDER — VITAMIN B-12 1000 MCG PO TABS
1000.0000 ug | ORAL_TABLET | Freq: Every day | ORAL | Status: DC
  Administered 2019-11-22 – 2019-11-23 (×2): 1000 ug via ORAL
  Filled 2019-11-21 (×2): qty 1

## 2019-11-21 MED ORDER — CYANOCOBALAMIN 1000 MCG/ML IJ SOLN
1000.0000 ug | Freq: Once | INTRAMUSCULAR | Status: AC
  Administered 2019-11-21: 1000 ug via INTRAMUSCULAR
  Filled 2019-11-21: qty 1

## 2019-11-21 NOTE — Progress Notes (Signed)
VASCULAR LAB PRELIMINARY  PRELIMINARY  PRELIMINARY  PRELIMINARY  Bilateral lower extremity venous duplex completed.    Preliminary report:  See CV proc for preliminary results.   Keira Bohlin, RVT 11/21/2019, 2:17 PM

## 2019-11-21 NOTE — TOC Initial Note (Signed)
Transition of Care [facility]) - Initial/Assessment Note    Patient Details  Name: Darren Pearson MRN: [id number] Date of Birth: 10-11-1966  Transition of Care [facility]) CM/SW Contact:    Pollie Friar, RN Phone Number: 11/21/2019, 3:37 PM  Clinical Narrative:                 Pt lives with his sister that works from 5 pm until 9 pm. Pt without a PCP or insurance.  CM talked with him about the [facility] and he was interested. CM was able to get him an appt and placed the information on the AVS. CM provided him resources for outpatient and inpatient drug counseling. Pt voiced understanding.  TOC following for potential medication assistance at d/c.   Expected Discharge Plan: [place] Barriers to Discharge: Inadequate or no insurance, Barriers Unresolved (comment), Continued Medical Work up   Patient Goals and CMS Choice     Choice offered to / list presented to : Patient  Expected Discharge Plan and Services Expected Discharge Plan: [place]   Discharge Planning Services: CM Consult Post Acute Care Choice: [place] arrangements for the past 2 months: Apartment                                      Prior Living Arrangements/Services Living arrangements for the past 2 months: Apartment Lives with:: Siblings(sister) Patient language and need for interpreter reviewed:: Yes Do you feel safe going back to the place where you live?: Yes      Need for Family Participation in Patient Care: No (Comment) Care giver support system in place?: Yes (comment)(sister is with him except when she works 5pm -9pm)   Criminal Activity/Legal Involvement Pertinent to Current Situation/Hospitalization: No - Comment as needed  Activities of Daily Living Home Assistive Devices/Equipment: None ADL Screening (condition at time of admission) Patient's cognitive ability adequate to safely complete daily activities?: Yes Is the patient deaf or have  difficulty hearing?: No Does the patient have difficulty seeing, even when wearing glasses/contacts?: No Does the patient have difficulty concentrating, remembering, or making decisions?: No Patient able to express need for assistance with ADLs?: Yes Does the patient have difficulty dressing or bathing?: No Independently performs ADLs?: Yes (appropriate for developmental age) Does the patient have difficulty walking or climbing stairs?: No Weakness of Legs: None Weakness of Arms/Hands: None  Permission Sought/Granted                  Emotional Assessment Appearance:: Appears stated age Attitude/Demeanor/Rapport: Engaged Affect (typically observed): Accepting Orientation: : Oriented to Self, Oriented to Place, Oriented to  Time, Oriented to Situation Alcohol / Substance Use: Illicit Drugs Psych Involvement: No (comment)  Admission diagnosis:  Acute CVA (cerebrovascular accident) ([place]) [I63.9] Cerebrovascular accident (CVA), unspecified mechanism ([place]) [I63.9] Patient Active Problem List   Diagnosis Date Noted  . Acute CVA (cerebrovascular accident) ([place]) 11/20/2019  . Hypokalemia 11/20/2019  . Accelerated hypertension 11/20/2019  . Hyperammonemia ([place]) 11/20/2019  . Elevated serum creatinine 11/20/2019  . Elevated blood pressure 03/15/2015  . Rash and nonspecific skin eruption 03/15/2015  . Hepatitis C 04/12/2013  . Allergic rhinitis 04/12/2013  . Unspecified asthma(493.90) 04/12/2013   PCP:  Jearld Fenton, NP Pharmacy:   [id number] - [place], [address] [place] [address] Phone: [phone] Fax: [phone]  Social Determinants of Health (SDOH) Interventions    Readmission Risk Interventions No flowsheet data found.

## 2019-11-21 NOTE — Progress Notes (Signed)
  Echocardiogram 2D Echocardiogram has been performed.  Darren Pearson 11/21/2019, 1:23 PM

## 2019-11-21 NOTE — Progress Notes (Signed)
Occupational Therapy Evaluation Patient Details Name: Darren Pearson MRN: [id number] DOB: [birth year] Today's Date: 11/21/2019    History of Present Illness Pt is 53 yo male with hep C and HTN who presents with over a week of poor balance and at least 3 falls. MRI brain revealed acute/subacute punctate bilateral cortical infarctions involving the right postcentral gyrus and left corona radiata, as well as an equivocal focus of restricted diffusion in the medial right pons suggestive of acute infarction vs T2-shine through artifact. Also noted are remote hemorrhagic lacunar infarcts of the right caudate head and right cerebellum. Chronic small vessel ischemic changes and mild atrophy also noted.    Clinical Impression   PTA, pt was living at home with his sister, pt reports he was independent with ADL/IADL and functional mobility. He was working and was using [employer] for transportation.  Pt currently requires minguard for ADL and functional mobility at RW level. He demonstrates decreased strength in LUE vs RUE, but is able to complete fine motor tasks with increased time and effort. Due to decline in current level of function, pt would benefit from acute OT to address established goals to facilitate safe D/C to venue listed below. At this time, anticipate pt will progress toward baseline with acute OT and no follow-up OT at this time. Will continue to follow acutely.     Follow Up Recommendations  No OT follow up    Equipment Recommendations  None recommended by OT    Recommendations for Other Services       Precautions / Restrictions Precautions Precautions: Fall Precaution Comments: 3 falls at home before coming in Restrictions Weight Bearing Restrictions: No      Mobility Bed Mobility Overal bed mobility: Modified Independent                Transfers Overall transfer level: Needs assistance   Transfers: Sit to/from Stand Sit to Stand: Min guard         General  transfer comment: minguard for safety and vc for safe hand placement    Balance Overall balance assessment: Needs assistance Sitting-balance support: Feet supported;No upper extremity supported Sitting balance-Leahy Scale: Good     Standing balance support: Single extremity supported Standing balance-Leahy Scale: Fair Standing balance comment: limitations evident with perturbation                           ADL either performed or assessed with clinical judgement   ADL Overall ADL's : Needs assistance/impaired Eating/Feeding: Set up;Sitting   Grooming: Min guard;Standing Grooming Details (indicate cue type and reason): completed at sink level;mingaurd for stability;pt required increased time and effort for fine motor coordination but able to complete ADL Upper Body Bathing: Min guard;Sitting   Lower Body Bathing: Sit to/from stand;Minimal assistance   Upper Body Dressing : Min guard;Sitting   Lower Body Dressing: Sit to/from stand;Min guard   Toilet Transfer: Min guard;Ambulation Toilet Transfer Details (indicate cue type and reason): minguard for safe use of DME and safe hand placement during transfers [place] and Hygiene: Min guard;Sit to/from stand Toileting - Clothing Manipulation Details (indicate cue type and reason): minguard for safety     Functional mobility during ADLs: Min guard;Rolling walker General ADL Comments: minguard during functional mobility for safety;     Vision Baseline Vision/History: Wears glasses Wears Glasses: At all times Patient Visual Report: No change from baseline Vision Assessment?: No apparent visual deficits     Perception  Praxis      Pertinent Vitals/Pain Pain Assessment: No/denies pain     Hand Dominance Left(missing 3 L fingers (many years ago))   Extremity/Trunk Assessment Upper Extremity Assessment Upper Extremity Assessment: LUE deficits/detail;RUE deficits/detail RUE Deficits /  Details: WNL strength 5/5  LUE Deficits / Details: digits 3, 4, 5 amputated (many years ago) decreased rapid alternating movements, grossly 3+/5; able to use functionally but requires increased time and effort for use of LUE LUE Sensation: decreased proprioception LUE Coordination: decreased gross motor;decreased fine motor   Lower Extremity Assessment Lower Extremity Assessment: LLE deficits/detail LLE Deficits / Details: hip flex 4/5, knee ext 4+/5, knee flex 4+/5, ataxic motion noted on exam. Pt L handed and usually stronger LLE than R but on eval R 5/5 throughout LLE Sensation: decreased proprioception LLE Coordination: decreased gross motor   Cervical / Trunk Assessment Cervical / Trunk Assessment: Normal   Communication Communication Communication: No difficulties   Cognition Arousal/Alertness: Awake/alert Behavior During Therapy: WFL for tasks assessed/performed Overall Cognitive Status: Within Functional Limits for tasks assessed                                 General Comments: decreased safety awareness   General Comments  educated pt on BE FAST signs and symptoms of a stroke, provided with written handout;pt with skin patch on L thigh, rough in texture, pt reports it is itching, about 3inches long, 1 inch wide, RN alerted    Exercises     Shoulder Instructions      Home Living Family/patient expects to be discharged to:: Private residence Living Arrangements: Other relatives Available Help at Discharge: Family;Available 24 hours/day Type of Home: House Home Access: Level entry     Home Layout: Two level Alternate Level Stairs-Number of Steps: flight Alternate Level Stairs-Rails: Left           Home Equipment: None   Additional Comments: pt lives with his sister (who is home all of the time right now because she just had surgery for breast cancer). His stairs have a rail but he pulled half of it off the wall when he fell down the stairs PTA.       Prior Functioning/Environment Level of Independence: Independent        Comments: pt can drive but does not have a car, was taking Melburn Popper to work at [employer]. Sister does not currently drive either         OT Problem List: Decreased strength;Decreased range of motion;Decreased activity tolerance;Impaired balance (sitting and/or standing);Decreased safety awareness;Decreased coordination      OT Treatment/Interventions: Self-care/ADL training;Therapeutic exercise;DME and/or AE instruction;Therapeutic activities;Patient/family education;Balance training    OT Goals(Current goals can be found in the care plan section) Acute Rehab OT Goals Patient Stated Goal: return home and get back to work OT Goal Formulation: With patient Time For Goal Achievement: 12/05/19 Potential to Achieve Goals: Good ADL Goals Pt Will Perform Grooming: with modified independence Pt Will Perform Upper Body Dressing: with modified independence Pt Will Perform Lower Body Dressing: with modified independence;sit to/from stand Pt Will Transfer to Toilet: with modified independence;ambulating Additional ADL Goal #1: Pt will demonstrate independence with medication managment.  OT Frequency: Min 2X/week   Barriers to D/C: Decreased caregiver support  pt lives with sister       Co-evaluation              AM-PAC OT "6 Clicks" Daily Activity  Outcome Measure Help from another person eating meals?: A Little Help from another person taking care of personal grooming?: A Little Help from another person toileting, which includes using toliet, bedpan, or urinal?: A Little Help from another person bathing (including washing, rinsing, drying)?: A Little Help from another person to put on and taking off regular upper body clothing?: A Little Help from another person to put on and taking off regular lower body clothing?: A Little 6 Click Score: 18   End of Session Equipment Utilized During Treatment: Gait  belt;Rolling walker Nurse Communication: Mobility status  Activity Tolerance: Patient tolerated treatment well Patient left: in bed;with call bell/phone within reach;with bed alarm set  OT Visit Diagnosis: Unsteadiness on feet (R26.81);Other abnormalities of gait and mobility (R26.89);Muscle weakness (generalized) (M62.81);History of falling (Z91.81)                Time: [id number] OT Time Calculation (min): 23 min Charges:  OT General Charges $OT Visit: 1 Visit OT Evaluation $OT Eval Moderate Complexity: 1 Mod OT Treatments $Self Care/Home Management : 8-22 mins  Dorinda Hill OTR/L Acute Rehabilitation Services Office: [place] 11/21/2019, 3:22 PM

## 2019-11-21 NOTE — Progress Notes (Signed)
STROKE TEAM PROGRESS NOTE   SUBJECTIVE (INTERVAL HISTORY) His PT is at the bedside.  Overall his condition is stable. He stated that he had imbalance for 2 weeks and had 3-4 falls. Falls not specifically at night or with eye closed. He knew his BP high but not on any meds. He is current smoker, 2-3 days a pack. He used to use cocaine, last time use was in July this year.   OBJECTIVE Temp:  [97.6 F (36.4 C)-98.9 F (37.2 C)] 98.5 F (36.9 C) (12/28 0738) Pulse Rate:  [74-87] 83 (12/28 0738) Cardiac Rhythm: Normal sinus rhythm;Bundle branch block (12/28 0700) Resp:  [15-19] 19 (12/28 0738) BP: (165-188)/(99-118) 173/109 (12/28 0738) SpO2:  [97 %-100 %] 100 % (12/28 0738)  Recent Labs  Lab 11/20/19 1006  NA 140  K 3.1*  CL 104  CO2 26  GLUCOSE 91  BUN 13  CREATININE 1.27*  CALCIUM 8.8*   Recent Labs  Lab 11/20/19 1006  AST 24  ALT 13  ALKPHOS 89  BILITOT 1.0  PROT 8.0  ALBUMIN 3.7   Recent Labs  Lab 11/20/19 1006  WBC 6.4  NEUTROABS 4.4  HGB 12.2*  HCT 38.0*  MCV 95.5  PLT 232   Recent Labs    11/20/19 1006  COLORURINE YELLOW  LABSPEC 1.013  PHURINE 7.0  GLUCOSEU NEGATIVE  HGBUR NEGATIVE  BILIRUBINUR NEGATIVE  KETONESUR NEGATIVE  PROTEINUR NEGATIVE  NITRITE NEGATIVE  LEUKOCYTESUR NEGATIVE       Component Value Date/Time   CHOL 149 06/05/2014 1329   TRIG 38.0 06/05/2014 1329   HDL 78.80 06/05/2014 1329   CHOLHDL 2 06/05/2014 1329   VLDL 7.6 06/05/2014 1329   LDLCALC 63 06/05/2014 1329   Lab Results  Component Value Date   HGBA1C 4.5 (L) 04/12/2013   No results found for: LABOPIA, COCAINSCRNUR, LABBENZ, AMPHETMU, THCU, LABBARB  No results for input(s): ETH in the last 168 hours.  I have personally reviewed the radiological images below and agree with the radiology interpretations.  CT ANGIO HEAD W OR WO CONTRAST  Result Date: 11/21/2019 CLINICAL DATA:  Stroke, follow-up EXAM: CT ANGIOGRAPHY HEAD AND NECK TECHNIQUE: Multidetector CT  imaging of the head and neck was performed using the standard protocol during bolus administration of intravenous contrast. Multiplanar CT image reconstructions and MIPs were obtained to evaluate the vascular anatomy. Carotid stenosis measurements (when applicable) are obtained utilizing NASCET criteria, using the distal internal carotid diameter as the denominator. CONTRAST:  40mL OMNIPAQUE IOHEXOL 350 MG/ML SOLN COMPARISON:  None. FINDINGS: CTA NECK FINDINGS Aortic arch: Great vessel origins are patent. Right carotid system: Common, internal, and external carotid arteries are patent. There is minor plaque without measurable stenosis at the ICA origin. Left carotid system: Common, internal, and external carotid arteries are patent. There is minor plaque without measurable stenosis at the ICA origin. Vertebral arteries: Patent. Right vertebral artery is dominant. There is focal moderate stenosis of the proximal right vertebral artery just beyond the origin. Skeleton: Degenerative changes are present, greatest at C6-C7. Other neck: No neck mass or adenopathy. Upper chest: Bullous changes at the lung apices. Paraseptal and centrilobular emphysema. Review of the MIP images confirms the above findings CTA HEAD FINDINGS Anterior circulation: Intracranial internal carotid arteries are patent with mild calcified plaque. There is a 2 x 1 mm laterally directed outpouching from the paraclinoid left ICA. Anterior and middle cerebral arteries are patent. Posterior circulation: Intracranial right vertebral artery is patent with mild calcified and noncalcified plaque.  Left vertebral artery terminates as a PICA. Basilar artery is patent. Posterior cerebral arteries are patent. Right larger than left posterior communicating arteries are present. Venous sinuses: As permitted by contrast timing, patent. Anatomic variants: Fetal origin of the right posterior cerebral artery. Review of the MIP images confirms the above findings  IMPRESSION: No large vessel occlusion or hemodynamically significant stenosis. Small aneurysm of the paraclinoid left ICA. Electronically Signed   By: Macy Mis M.D.   On: 11/21/2019 10:06   DG Chest 2 View  Result Date: 11/20/2019 CLINICAL DATA:  Status post fall on left ribs. Shortness of breath. EXAM: CHEST - 2 VIEW COMPARISON:  August 12, 2016 FINDINGS: The heart size and mediastinal contours are within normal limits. Both lungs are clear. The lungs are hyperinflated. The visualized skeletal structures are unremarkable. The visualized left ribs demonstrate no acute fracture or dislocation. IMPRESSION: 1. No active cardiopulmonary disease.  COPD. 2. No acute fracture or dislocation of left ribs. Electronically Signed   By: Abelardo Diesel M.D.   On: 11/20/2019 10:03   CT Head Wo Contrast  Result Date: 11/20/2019 CLINICAL DATA:  Off balance for several weeks. Multiple falls. EXAM: CT HEAD WITHOUT CONTRAST TECHNIQUE: Contiguous axial images were obtained from the base of the skull through the vertex without intravenous contrast. COMPARISON:  None. FINDINGS: Brain: No evidence of acute infarction, hemorrhage, hydrocephalus, extra-axial collection or mass lesion/mass effect. There is chronic diffuse atrophy. Chronic bilateral periventricular white matter small vessel ischemic changes are noted. Small old lacunar infarction is identified in the right basal ganglia. Vascular: No hyperdense vessel is noted. Skull: Normal. Negative for fracture or focal lesion. Sinuses/Orbits: No acute finding. Other: None. IMPRESSION: 1. No focal acute intracranial abnormality identified. 2. Chronic diffuse atrophy. Chronic bilateral periventricular white matter small vessel ischemic change. Electronically Signed   By: Abelardo Diesel M.D.   On: 11/20/2019 10:20   CT ANGIO NECK W OR WO CONTRAST  Result Date: 11/21/2019 CLINICAL DATA:  Stroke, follow-up EXAM: CT ANGIOGRAPHY HEAD AND NECK TECHNIQUE: Multidetector CT  imaging of the head and neck was performed using the standard protocol during bolus administration of intravenous contrast. Multiplanar CT image reconstructions and MIPs were obtained to evaluate the vascular anatomy. Carotid stenosis measurements (when applicable) are obtained utilizing NASCET criteria, using the distal internal carotid diameter as the denominator. CONTRAST:  37mL OMNIPAQUE IOHEXOL 350 MG/ML SOLN COMPARISON:  None. FINDINGS: CTA NECK FINDINGS Aortic arch: Great vessel origins are patent. Right carotid system: Common, internal, and external carotid arteries are patent. There is minor plaque without measurable stenosis at the ICA origin. Left carotid system: Common, internal, and external carotid arteries are patent. There is minor plaque without measurable stenosis at the ICA origin. Vertebral arteries: Patent. Right vertebral artery is dominant. There is focal moderate stenosis of the proximal right vertebral artery just beyond the origin. Skeleton: Degenerative changes are present, greatest at C6-C7. Other neck: No neck mass or adenopathy. Upper chest: Bullous changes at the lung apices. Paraseptal and centrilobular emphysema. Review of the MIP images confirms the above findings CTA HEAD FINDINGS Anterior circulation: Intracranial internal carotid arteries are patent with mild calcified plaque. There is a 2 x 1 mm laterally directed outpouching from the paraclinoid left ICA. Anterior and middle cerebral arteries are patent. Posterior circulation: Intracranial right vertebral artery is patent with mild calcified and noncalcified plaque. Left vertebral artery terminates as a PICA. Basilar artery is patent. Posterior cerebral arteries are patent. Right larger than left posterior communicating  arteries are present. Venous sinuses: As permitted by contrast timing, patent. Anatomic variants: Fetal origin of the right posterior cerebral artery. Review of the MIP images confirms the above findings  IMPRESSION: No large vessel occlusion or hemodynamically significant stenosis. Small aneurysm of the paraclinoid left ICA. Electronically Signed   By: Macy Mis M.D.   On: 11/21/2019 10:06   MR BRAIN WO CONTRAST  Result Date: 11/20/2019 CLINICAL DATA:  Off balance for several weeks. Multiple falls. Stroke, follow-up. EXAM: MRI HEAD WITHOUT CONTRAST TECHNIQUE: Multiplanar, multiecho pulse sequences of the brain and surrounding structures were obtained without intravenous contrast. COMPARISON:  CT head without contrast 11/20/2019 FINDINGS: Brain: The diffusion-weighted images demonstrate bilateral acute cortical infarction. An area of focal restricted diffusion is present in the right left postcentral gyrus and corona radiata. A punctate cortical focus of restricted diffusion is present in the right parietal lobe on image 34 of series 4. Age advanced atrophy and white matter disease is present. A remote hemorrhagic lacunar infarct is present in the right caudate head. Remote nonhemorrhagic infarcts present in the thalami bilaterally. Diffuse confluent white matter disease is present bilaterally with multiple remote lacunar infarcts in the corona radiata bilaterally. The ventricles are proportionate to the degree of atrophy. Multiple remote nonhemorrhagic lacunar infarcts are present within the pons. Remote lacunar infarcts are present in the right cerebellum. Vascular: Flow is present in the major intracranial arteries. Skull and upper cervical spine: The craniocervical junction is normal. Upper cervical spine is within normal limits. Marrow signal is unremarkable. Sinuses/Orbits: There is some fluid in the right mastoid air cells. No obstructing nasopharyngeal lesion is present. Mild mucosal thickening is present within anterior ethmoid air cells bilaterally as well as the inferior left frontal sinus. No fluid levels are present. The paranasal sinuses and mastoid air cells are otherwise clear. The globes  and orbits are within normal limits. IMPRESSION: 1. Acute/subacute bilateral cortical infarction involving the left postcentral gyrus and corona radiata. 2. Additional punctate cortical infarct in the right parietal lobe. 3. Age advanced atrophy and white matter disease likely reflects the sequela of chronic microvascular ischemia. 4. Remote hemorrhagic lacunar infarcts of the right caudate head and right cerebellum. 5. Mild sinus disease as described. Electronically Signed   By: San Morelle M.D.   On: 11/20/2019 15:46    PHYSICAL EXAM  Temp:  [97.6 F (36.4 C)-98.9 F (37.2 C)] 98.5 F (36.9 C) (12/28 0738) Pulse Rate:  [74-87] 83 (12/28 0738) Resp:  [15-19] 19 (12/28 0738) BP: (165-188)/(99-118) 173/109 (12/28 0738) SpO2:  [97 %-100 %] 100 % (12/28 0738)  General - Well nourished, well developed, in no apparent distress.  Ophthalmologic - fundi not visualized due to noncooperation.  Cardiovascular - Regular rhythm and rate.  Mental Status -  Level of arousal and orientation to time, place, and person were intact. Language including expression, naming, repetition, comprehension was assessed and found intact. Fund of Knowledge was assessed and was intact.  Cranial Nerves II - XII - II - Visual field intact OU. III, IV, VI - Extraocular movements intact. V - Facial sensation intact bilaterally. VII - Facial movement intact bilaterally. VIII - Hearing & vestibular intact bilaterally. X - Palate elevates symmetrically. XI - Chin turning & shoulder shrug intact bilaterally. XII - Tongue protrusion intact.  Motor Strength - The patient's strength was normal in all extremities and pronator drift was absent. 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 Bulk was normal  and fasciculations were absent.   Motor Tone - Muscle tone was assessed at the neck and appendages and was normal.  Reflexes - The patient's reflexes were  symmetrical in all extremities and he had no pathological reflexes.  Sensory - Light touch, temperature/pinprick were assessed and were symmetrical. B/l LE proprioception intact.   Coordination - The patient had normal movements in the hands with no ataxia or dysmetria. However, left heel to shin was mildly ataxic. Right HTS was unremarkable. Tremor was absent.  Gait and Station - deferred.   ASSESSMENT/PLAN Darren Pearson is a 53 y.o. male with history of hepatitis C and HTN presenting with poor balance for 1.5 weeks, having fallen 3 times with unsteadiness that would resolve with rest. MRI showed acute punctate B cortical infarcts. Not a tPA candidate as out of the window.   Stroke: punctate left CR, L mesial temporal lobe, right parietal cortical, and right pontine infarcts - embolic pattern - secondary to unknown source  CT head no acute abnormality. Small vessel disease. Atrophy.   MRI  Acute/subacute B cortical L postcentral gyrus and corona radiata infarcts, punctate R cortical parietal lobe infarct. Small vessel disease. Atrophy. Old hemorrhagic lacunes R caudate head and R cerebellum. Sinus dz.   CTA head & neck no LVO. Small paraclinoid L ICA aneurysm   LE Doppler no DVT  2D Echo EF 45-50%  Will request TEE and loop recorder for embolic work up  LDL pending   HgbA1c pending   UDS neg  HIV pending   Hypercoagulable labs pending   TSH normal  B12 = 170  RPR pending   Lovenox 40 mg sq daily  for VTE prophylaxis  No antithrombotic prior to admission, now on aspirin 81 mg daily and clopidogrel 75 mg daily. Continue DAPT for 3 weeks and then ASA alone  Therapy recommendations:  HH PT  Disposition:  pending   Hx of cocaine abuse  Last use 05/2019  UDS this admission neg  Educated on continued cocaine cessation  Tobacco abuse  Current smoker  Smoking cessation counseling provided  Nicotine patch provided  Pt is willing to  quit  Hypertension Hypertensive Urgency . Home meds: none . Permissive hypertension (OK if <220/120) for 24-48 hours post stroke and then gradually normalized within 5-7 days.  Long term BP goal normotensive  Hyperlipidemia  Home meds:  No statin   LDL pending, goal < 70  Now on Lipitor 40  Continue statin at discharge  B12 deficiency  B12 = 170  Pt symptoms not typical for subacute combined degeneration  No proprioception/vibration sensation loss on exam  B12 supplement with IM and po  Other Stroke Risk Factors  ETOH use, advised to stop d/t stroke risk   Hx of marijuana use, advised to stop   Other Active Problems  Hypokalemia  AKI, Elevated Cre 1.27->1.19   Elevated NH4 = 55  Hospital day # 1  Rosalin Hawking, MD PhD Stroke Neurology 11/21/2019 11:57 AM    To contact Stroke Continuity provider, please refer to [email or web address] After hours, contact General Neurology

## 2019-11-21 NOTE — Progress Notes (Signed)
PROGRESS NOTE    Darren Pearson  [id number] DOB: 1965-11-27 DOA: 11/20/2019 PCP: Jearld Fenton, NP   Brief Narrative: Darren Pearson is a 53 y.o. male with Past medical history of hypertension, hep C. Patient presented secondary to a fall and found to have an acute stroke.   Assessment & Plan:   Active Problems:   Acute CVA (cerebrovascular accident) ([place])   Hypokalemia   Accelerated hypertension   Hyperammonemia (HCC)   Elevated serum creatinine   Acute CVA MRI significant for acute/subacute bilateral cortical infarction of left postcentral gyrus/corona radiata in addition to punctate cortical infarct of the right parietal lobe; also noted to have remote infarcts and evidence of likely chronic microvascular ischemia. Neurology on board -Neurology recommendations: CTA head/neck, telemetry, hypercoag workup (Lupus anticoagulant panel, b-2-glycoprotein igg/m/a, serum homocysteine, prothrombin gent mutation, cardiolipin igg/, ANA -Continue aspirin and Plavix  Essential hypertension Hypertensive urgency Patient is on no antihypertensives as an outpatient.   Hypokalemia Given supplementation.  AKI Creatinine of 1.27 on admission. -Repeat BMP  Elevated ammonia Does not appear to have any evidence of encephalopathy. Normal AST/ALT/Alkaline phosphatase  Tobacco use  Counseled on admission and on 12/28. Patient motivated.    DVT prophylaxis: Lovenox Code Status:   Code Status: Full Code Family Communication: None Disposition Plan: Discharge pending continued stroke workup and therapy recommendations   Consultants:   Neurology  Procedures:   None  Antimicrobials:  None    Subjective: No issues overnight.   Objective: Vitals:   11/20/19 1904 11/20/19 1946 11/20/19 2205 11/21/19 0404  BP: (!) 170/99 (!) 170/104 (!) 186/113 (!) 165/105  Pulse: 79 74 75 74  Resp: 16 18 17 17   Temp:  98.9 F (37.2 C) 97.6 F (36.4 C) 98.2 F (36.8 C)  TempSrc:   Oral Oral Oral  SpO2: 99% 98% 100% 99%  Weight:      Height:        Intake/Output Summary (Last 24 hours) at 11/21/2019 0717 Last data filed at 11/20/2019 1719 Gross per 24 hour  Intake 1000 ml  Output --  Net 1000 ml   Filed Weights   11/20/19 0825  Weight: 72.6 kg    Examination:  General exam: Appears calm and comfortable Respiratory system: Clear to auscultation. Respiratory effort normal. Cardiovascular system: S1 & S2 heard, RRR. No murmurs, rubs, gallops or clicks. Gastrointestinal system: Abdomen is nondistended, soft and nontender. No organomegaly or masses felt. Normal bowel sounds heard. Central nervous system: Alert and oriented. Extremities: No edema. No calf tenderness Skin: No cyanosis. No rashes Psychiatry: Judgement and insight appear normal. Mood & affect appropriate.     Data Reviewed: I have personally reviewed following labs and imaging studies  CBC: Recent Labs  Lab 11/20/19 1006  WBC 6.4  NEUTROABS 4.4  HGB 12.2*  HCT 38.0*  MCV 95.5  PLT [id number]   Basic Metabolic Panel: Recent Labs  Lab 11/20/19 1006  NA 140  K 3.1*  CL 104  CO2 26  GLUCOSE 91  BUN 13  CREATININE 1.27*  CALCIUM 8.8*   GFR: Estimated Creatinine Clearance: 64.3 mL/min (A) (by C-G formula based on SCr of 1.27 mg/dL (H)). Liver Function Tests: Recent Labs  Lab 11/20/19 1006  AST 24  ALT 13  ALKPHOS 89  BILITOT 1.0  PROT 8.0  ALBUMIN 3.7   No results for input(s): LIPASE, AMYLASE in the last 168 hours. Recent Labs  Lab 11/20/19 1006  AMMONIA 55*   Coagulation Profile: No results  for input(s): INR, PROTIME in the last 168 hours. Cardiac Enzymes: No results for input(s): CKTOTAL, CKMB, CKMBINDEX, TROPONINI in the last 168 hours. BNP (last 3 results) No results for input(s): PROBNP in the last 8760 hours. HbA1C: No results for input(s): HGBA1C in the last 72 hours. CBG: No results for input(s): GLUCAP in the last 168 hours. Lipid Profile: No results  for input(s): CHOL, HDL, LDLCALC, TRIG, CHOLHDL, LDLDIRECT in the last 72 hours. Thyroid Function Tests: No results for input(s): TSH, T4TOTAL, FREET4, T3FREE, THYROIDAB in the last 72 hours. Anemia Panel: No results for input(s): VITAMINB12, FOLATE, FERRITIN, TIBC, IRON, RETICCTPCT in the last 72 hours. Sepsis Labs: No results for input(s): PROCALCITON, LATICACIDVEN in the last 168 hours.  Recent Results (from the past 240 hour(s))  SARS CORONAVIRUS 2 (TAT 6-24 HRS) Nasopharyngeal Nasopharyngeal Swab     Status: None   Collection Time: 11/20/19  4:07 PM   Specimen: Nasopharyngeal Swab  Result Value Ref Range Status   SARS Coronavirus 2 NEGATIVE NEGATIVE Final    Comment: (NOTE) SARS-CoV-2 target nucleic acids are NOT DETECTED. The SARS-CoV-2 RNA is generally detectable in upper and lower respiratory specimens during the acute phase of infection. Negative results do not preclude SARS-CoV-2 infection, do not rule out co-infections with other pathogens, and should not be used as the sole basis for treatment or other patient management decisions. Negative results must be combined with clinical observations, patient history, and epidemiological information. The expected result is Negative. Fact Sheet for Patients: SugarRoll.be Fact Sheet for Healthcare Providers: https://www.woods-mathews.com/ This test is not yet approved or cleared by the [place] FDA and  has been authorized for detection and/or diagnosis of SARS-CoV-2 by FDA under an Emergency Use Authorization (EUA). This EUA will remain  in effect (meaning this test can be used) for the duration of the COVID-19 declaration under Section 56 4(b)(1) of the Act, 21 U.S.C. section 360bbb-3(b)(1), unless the authorization is terminated or revoked sooner. Performed at [facility], [place] [address] [place], [place] [address]          Radiology Studies: DG Chest 2  View  Result Date: 11/20/2019 CLINICAL DATA:  Status post fall on left ribs. Shortness of breath. EXAM: CHEST - 2 VIEW COMPARISON:  August 12, 2016 FINDINGS: The heart size and mediastinal contours are within normal limits. Both lungs are clear. The lungs are hyperinflated. The visualized skeletal structures are unremarkable. The visualized left ribs demonstrate no acute fracture or dislocation. IMPRESSION: 1. No active cardiopulmonary disease.  COPD. 2. No acute fracture or dislocation of left ribs. Electronically Signed   By: Abelardo Diesel M.D.   On: 11/20/2019 10:03   CT Head Wo Contrast  Result Date: 11/20/2019 CLINICAL DATA:  Off balance for several weeks. Multiple falls. EXAM: CT HEAD WITHOUT CONTRAST TECHNIQUE: Contiguous axial images were obtained from the base of the skull through the vertex without intravenous contrast. COMPARISON:  None. FINDINGS: Brain: No evidence of acute infarction, hemorrhage, hydrocephalus, extra-axial collection or mass lesion/mass effect. There is chronic diffuse atrophy. Chronic bilateral periventricular white matter small vessel ischemic changes are noted. Small old lacunar infarction is identified in the right basal ganglia. Vascular: No hyperdense vessel is noted. Skull: Normal. Negative for fracture or focal lesion. Sinuses/Orbits: No acute finding. Other: None. IMPRESSION: 1. No focal acute intracranial abnormality identified. 2. Chronic diffuse atrophy. Chronic bilateral periventricular white matter small vessel ischemic change. Electronically Signed   By: Abelardo Diesel M.D.   On: 11/20/2019  10:20   MR BRAIN WO CONTRAST  Result Date: 11/20/2019 CLINICAL DATA:  Off balance for several weeks. Multiple falls. Stroke, follow-up. EXAM: MRI HEAD WITHOUT CONTRAST TECHNIQUE: Multiplanar, multiecho pulse sequences of the brain and surrounding structures were obtained without intravenous contrast. COMPARISON:  CT head without contrast 11/20/2019 FINDINGS: Brain: The  diffusion-weighted images demonstrate bilateral acute cortical infarction. An area of focal restricted diffusion is present in the right left postcentral gyrus and corona radiata. A punctate cortical focus of restricted diffusion is present in the right parietal lobe on image 34 of series 4. Age advanced atrophy and white matter disease is present. A remote hemorrhagic lacunar infarct is present in the right caudate head. Remote nonhemorrhagic infarcts present in the thalami bilaterally. Diffuse confluent white matter disease is present bilaterally with multiple remote lacunar infarcts in the corona radiata bilaterally. The ventricles are proportionate to the degree of atrophy. Multiple remote nonhemorrhagic lacunar infarcts are present within the pons. Remote lacunar infarcts are present in the right cerebellum. Vascular: Flow is present in the major intracranial arteries. Skull and upper cervical spine: The craniocervical junction is normal. Upper cervical spine is within normal limits. Marrow signal is unremarkable. Sinuses/Orbits: There is some fluid in the right mastoid air cells. No obstructing nasopharyngeal lesion is present. Mild mucosal thickening is present within anterior ethmoid air cells bilaterally as well as the inferior left frontal sinus. No fluid levels are present. The paranasal sinuses and mastoid air cells are otherwise clear. The globes and orbits are within normal limits. IMPRESSION: 1. Acute/subacute bilateral cortical infarction involving the left postcentral gyrus and corona radiata. 2. Additional punctate cortical infarct in the right parietal lobe. 3. Age advanced atrophy and white matter disease likely reflects the sequela of chronic microvascular ischemia. 4. Remote hemorrhagic lacunar infarcts of the right caudate head and right cerebellum. 5. Mild sinus disease as described. Electronically Signed   By: San Morelle M.D.   On: 11/20/2019 15:46        Scheduled Meds: .   stroke: mapping our early stages of recovery book   Does not apply Once  . aspirin EC  81 mg Oral Daily  . atorvastatin  40 mg Oral q1800  . clopidogrel  75 mg Oral Daily  . enoxaparin (LOVENOX) injection  40 mg Subcutaneous Q24H  . nicotine  14 mg Transdermal Daily   Continuous Infusions:   LOS: 1 day     Cordelia Poche, MD [facility] 11/21/2019, 7:17 AM  If 7PM-7AM, please contact night-coverage [email or web address]

## 2019-11-21 NOTE — H&P (View-Only) (Signed)
ELECTROPHYSIOLOGY CONSULT NOTE  Patient ID: Darren Pearson MRN: [id number], DOB/AGE: [birth year]   Admit date: 11/20/2019 Date of Consult: 11/22/19   Primary Physician: Jearld Fenton, NP Primary Cardiologist: No primary care provider on file.  Primary Electrophysiologist: New to Dr. Rayann Heman Reason for Consultation: Cryptogenic stroke; recommendations regarding Implantable Loop Recorder  History of Present Illness EP has been asked to evaluate Darren Pearson for placement of an implantable loop recorder to monitor for atrial fibrillation by Dr Erlinda Hong.  The patient was admitted on 11/20/2019 with poor balance for 1.5 weeks and 3 falls with unsteadiness that would resolve with rest. MRI showed acute punctate bilateral cortical infarcts. Outside of TPA window. They have undergone workup for stroke including echocardiogram and CTA Head and Neck which showed small paraclinoid L ICA aneursym. LE dopplers with no DVTs The patient has been monitored on telemetry which has demonstrated sinus rhythm with no arrhythmias.  Inpatient stroke work-up will require a TEE per Neurology.   Echocardiogram this admission demonstrated LVEF 45-50%.  Lab work is reviewed.  Prior to admission, the patient denies chest pain, shortness of breath, dizziness, palpitations, or syncope.  They are recovering from their stroke with plans to return home  at discharge.  Past Medical History:  Diagnosis Date   Allergy    Anxiety and depression    Asthma    Hepatitis C    History of chicken pox    Hypertension      Surgical History: History reviewed. No pertinent surgical history.   Medications Prior to Admission  Medication Sig Dispense Refill Last Dose   acetaminophen (TYLENOL) 325 MG tablet Take 650 mg by mouth every 6 (six) hours as needed for mild pain or headache.   Past Week at Unknown time   polyvinyl alcohol (LIQUIFILM TEARS) 1.4 % ophthalmic solution Place 1 drop into both eyes as needed for dry eyes.    Past Month at Unknown time   azithromycin (ZITHROMAX) 250 MG tablet Take 1 tablet (250 mg total) by mouth daily. Take first 2 tablets together, then 1 every day until finished. (Patient not taking: Reported on 11/20/2019) 6 tablet 0 Not Taking at Unknown time   doxycycline (VIBRAMYCIN) 100 MG capsule Take 1 capsule (100 mg total) by mouth 2 (two) times daily. (Patient not taking: Reported on 11/20/2019) 20 capsule 0 Not Taking at Unknown time   levofloxacin (LEVAQUIN) 500 MG tablet Take 1 tablet (500 mg total) by mouth daily. (Patient not taking: Reported on 05/14/2016) 9 tablet 0 Not Taking at Unknown time   ondansetron (ZOFRAN ODT) 4 MG disintegrating tablet Take 1 tablet (4 mg total) by mouth every 8 (eight) hours as needed for nausea or vomiting. (Patient not taking: Reported on 05/14/2016) 10 tablet 0 Not Taking at Unknown time   oxyCODONE-acetaminophen (PERCOCET) 5-325 MG tablet Take 1-2 tablets by mouth every 4 (four) hours as needed. (Patient not taking: Reported on 11/20/2019) 10 tablet 0 Not Taking at Unknown time   predniSONE (DELTASONE) 10 MG tablet Take 2 tablets (20 mg total) by mouth 2 (two) times daily. (Patient not taking: Reported on 11/20/2019) 20 tablet 0 Not Taking at Unknown time    Inpatient Medications:    stroke: mapping our early stages of recovery book   Does not apply Once   aspirin EC  81 mg Oral Daily   atorvastatin  40 mg Oral q1800   clopidogrel  75 mg Oral Daily   enoxaparin (LOVENOX) injection  40 mg  Subcutaneous Q24H   nicotine  14 mg Transdermal Daily   [START ON 11/22/2019] vitamin B-12  1,000 mcg Oral Daily    Allergies:  Allergies  Allergen Reactions   Sulfa Antibiotics Hives    Social History   Socioeconomic History   Marital status: Single    Spouse name: Not on file   Number of children: Not on file   Years of education: 12   Highest education level: Not on file  Occupational History   Occupation: Shipping Department lead      Employer: [employer]  Tobacco Use   Smoking status: Current Some Day Smoker    Packs/day: 0.25    Types: Cigarettes    Start date: 11/24/2012   Smokeless tobacco: Never Used  Substance and Sexual Activity   Alcohol use: Yes    Alcohol/week: 2.0 standard drinks    Types: 2 Cans of beer per week    Comment: social   Drug use: Yes    Frequency: 5.0 times per week    Types: Marijuana   Sexual activity: Yes    Birth control/protection: Condom  Other Topics Concern   Not on file  Social History Narrative   Regular exercise-yes   Social Determinants of Health   Financial Resource Strain:    Difficulty of Paying Living Expenses: Not on file  Food Insecurity:    Worried About Charity fundraiser in the Last Year: Not on file   [employer] of Food in the Last Year: Not on file  Transportation Needs:    Lack of Transportation (Medical): Not on file   Lack of Transportation (Non-Medical): Not on file  Physical Activity:    Days of Exercise per Week: Not on file   Minutes of Exercise per Session: Not on file  Stress:    Feeling of Stress : Not on file  Social Connections:    Frequency of Communication with Friends and Family: Not on file   Frequency of Social Gatherings with Friends and Family: Not on file   Attends Religious Services: Not on file   Active Member of Clubs or Organizations: Not on file   Attends Archivist Meetings: Not on file   Marital Status: Not on file  Intimate Partner Violence:    Fear of Current or Ex-Partner: Not on file   Emotionally Abused: Not on file   Physically Abused: Not on file   Sexually Abused: Not on file     Family History  Problem Relation Age of Onset   Stomach cancer Mother    Diabetes Father    Paget's disease of bone Father       Review of Systems: All other systems reviewed and are otherwise negative except as noted above.  Physical Exam: Vitals:   11/21/19 0738 11/21/19 1201  11/21/19 1556 11/21/19 2017  BP: (!) 173/109 (!) 177/116 (!) 166/100 (!) 181/111  Pulse: 83 80 75 82  Resp: 19 20 20 17   Temp: 98.5 F (36.9 C) 98.3 F (36.8 C) 98.7 F (37.1 C) 98.1 F (36.7 C)  TempSrc: Oral Oral Oral Oral  SpO2: 100% 100% 99% 100%  Weight:      Height:        GEN- The patient is well appearing, alert and oriented x 3 today.   Head- normocephalic, atraumatic Eyes-  Sclera clear, conjunctiva pink Ears- hearing intact Oropharynx- clear Neck- supple Lungs- Clear to ausculation bilaterally, normal work of breathing Heart- Regular rate and rhythm, no murmurs,  rubs or gallops  GI- soft, NT, ND, + BS Extremities- no clubbing, cyanosis, or edema MS- no significant deformity or atrophy Skin- no rash or lesion Psych- euthymic mood, full affect   Labs:   Lab Results  Component Value Date   WBC 6.4 11/20/2019   HGB 12.2 (L) 11/20/2019   HCT 38.0 (L) 11/20/2019   MCV 95.5 11/20/2019   PLT 232 11/20/2019    Recent Labs  Lab 11/20/19 1006 11/21/19 1317  NA 140 140  K 3.1* 3.7  CL 104 106  CO2 26 27  BUN 13 10  CREATININE 1.27* 1.19  CALCIUM 8.8* 8.7*  PROT 8.0  --   BILITOT 1.0  --   ALKPHOS 89  --   ALT 13  --   AST 24  --   GLUCOSE 91 95     Radiology/Studies: CT ANGIO HEAD W OR WO CONTRAST  Result Date: 11/21/2019 CLINICAL DATA:  Stroke, follow-up EXAM: CT ANGIOGRAPHY HEAD AND NECK TECHNIQUE: Multidetector CT imaging of the head and neck was performed using the standard protocol during bolus administration of intravenous contrast. Multiplanar CT image reconstructions and MIPs were obtained to evaluate the vascular anatomy. Carotid stenosis measurements (when applicable) are obtained utilizing NASCET criteria, using the distal internal carotid diameter as the denominator. CONTRAST:  62mL OMNIPAQUE IOHEXOL 350 MG/ML SOLN COMPARISON:  None. FINDINGS: CTA NECK FINDINGS Aortic arch: Great vessel origins are patent. Right carotid system: Common,  internal, and external carotid arteries are patent. There is minor plaque without measurable stenosis at the ICA origin. Left carotid system: Common, internal, and external carotid arteries are patent. There is minor plaque without measurable stenosis at the ICA origin. Vertebral arteries: Patent. Right vertebral artery is dominant. There is focal moderate stenosis of the proximal right vertebral artery just beyond the origin. Skeleton: Degenerative changes are present, greatest at C6-C7. Other neck: No neck mass or adenopathy. Upper chest: Bullous changes at the lung apices. Paraseptal and centrilobular emphysema. Review of the MIP images confirms the above findings CTA HEAD FINDINGS Anterior circulation: Intracranial internal carotid arteries are patent with mild calcified plaque. There is a 2 x 1 mm laterally directed outpouching from the paraclinoid left ICA. Anterior and middle cerebral arteries are patent. Posterior circulation: Intracranial right vertebral artery is patent with mild calcified and noncalcified plaque. Left vertebral artery terminates as a PICA. Basilar artery is patent. Posterior cerebral arteries are patent. Right larger than left posterior communicating arteries are present. Venous sinuses: As permitted by contrast timing, patent. Anatomic variants: Fetal origin of the right posterior cerebral artery. Review of the MIP images confirms the above findings IMPRESSION: No large vessel occlusion or hemodynamically significant stenosis. Small aneurysm of the paraclinoid left ICA. Electronically Signed   By: Macy Mis M.D.   On: 11/21/2019 10:06   DG Chest 2 View  Result Date: 11/20/2019 CLINICAL DATA:  Status post fall on left ribs. Shortness of breath. EXAM: CHEST - 2 VIEW COMPARISON:  August 12, 2016 FINDINGS: The heart size and mediastinal contours are within normal limits. Both lungs are clear. The lungs are hyperinflated. The visualized skeletal structures are unremarkable. The  visualized left ribs demonstrate no acute fracture or dislocation. IMPRESSION: 1. No active cardiopulmonary disease.  COPD. 2. No acute fracture or dislocation of left ribs. Electronically Signed   By: Abelardo Diesel M.D.   On: 11/20/2019 10:03   CT Head Wo Contrast  Result Date: 11/20/2019 CLINICAL DATA:  Off balance for several weeks. Multiple  falls. EXAM: CT HEAD WITHOUT CONTRAST TECHNIQUE: Contiguous axial images were obtained from the base of the skull through the vertex without intravenous contrast. COMPARISON:  None. FINDINGS: Brain: No evidence of acute infarction, hemorrhage, hydrocephalus, extra-axial collection or mass lesion/mass effect. There is chronic diffuse atrophy. Chronic bilateral periventricular white matter small vessel ischemic changes are noted. Small old lacunar infarction is identified in the right basal ganglia. Vascular: No hyperdense vessel is noted. Skull: Normal. Negative for fracture or focal lesion. Sinuses/Orbits: No acute finding. Other: None. IMPRESSION: 1. No focal acute intracranial abnormality identified. 2. Chronic diffuse atrophy. Chronic bilateral periventricular white matter small vessel ischemic change. Electronically Signed   By: Abelardo Diesel M.D.   On: 11/20/2019 10:20   CT ANGIO NECK W OR WO CONTRAST  Result Date: 11/21/2019 CLINICAL DATA:  Stroke, follow-up EXAM: CT ANGIOGRAPHY HEAD AND NECK TECHNIQUE: Multidetector CT imaging of the head and neck was performed using the standard protocol during bolus administration of intravenous contrast. Multiplanar CT image reconstructions and MIPs were obtained to evaluate the vascular anatomy. Carotid stenosis measurements (when applicable) are obtained utilizing NASCET criteria, using the distal internal carotid diameter as the denominator. CONTRAST:  81mL OMNIPAQUE IOHEXOL 350 MG/ML SOLN COMPARISON:  None. FINDINGS: CTA NECK FINDINGS Aortic arch: Great vessel origins are patent. Right carotid system: Common, internal,  and external carotid arteries are patent. There is minor plaque without measurable stenosis at the ICA origin. Left carotid system: Common, internal, and external carotid arteries are patent. There is minor plaque without measurable stenosis at the ICA origin. Vertebral arteries: Patent. Right vertebral artery is dominant. There is focal moderate stenosis of the proximal right vertebral artery just beyond the origin. Skeleton: Degenerative changes are present, greatest at C6-C7. Other neck: No neck mass or adenopathy. Upper chest: Bullous changes at the lung apices. Paraseptal and centrilobular emphysema. Review of the MIP images confirms the above findings CTA HEAD FINDINGS Anterior circulation: Intracranial internal carotid arteries are patent with mild calcified plaque. There is a 2 x 1 mm laterally directed outpouching from the paraclinoid left ICA. Anterior and middle cerebral arteries are patent. Posterior circulation: Intracranial right vertebral artery is patent with mild calcified and noncalcified plaque. Left vertebral artery terminates as a PICA. Basilar artery is patent. Posterior cerebral arteries are patent. Right larger than left posterior communicating arteries are present. Venous sinuses: As permitted by contrast timing, patent. Anatomic variants: Fetal origin of the right posterior cerebral artery. Review of the MIP images confirms the above findings IMPRESSION: No large vessel occlusion or hemodynamically significant stenosis. Small aneurysm of the paraclinoid left ICA. Electronically Signed   By: Macy Mis M.D.   On: 11/21/2019 10:06   MR BRAIN WO CONTRAST  Result Date: 11/20/2019 CLINICAL DATA:  Off balance for several weeks. Multiple falls. Stroke, follow-up. EXAM: MRI HEAD WITHOUT CONTRAST TECHNIQUE: Multiplanar, multiecho pulse sequences of the brain and surrounding structures were obtained without intravenous contrast. COMPARISON:  CT head without contrast 11/20/2019 FINDINGS:  Brain: The diffusion-weighted images demonstrate bilateral acute cortical infarction. An area of focal restricted diffusion is present in the right left postcentral gyrus and corona radiata. A punctate cortical focus of restricted diffusion is present in the right parietal lobe on image 34 of series 4. Age advanced atrophy and white matter disease is present. A remote hemorrhagic lacunar infarct is present in the right caudate head. Remote nonhemorrhagic infarcts present in the thalami bilaterally. Diffuse confluent white matter disease is present bilaterally with multiple remote lacunar infarcts  in the corona radiata bilaterally. The ventricles are proportionate to the degree of atrophy. Multiple remote nonhemorrhagic lacunar infarcts are present within the pons. Remote lacunar infarcts are present in the right cerebellum. Vascular: Flow is present in the major intracranial arteries. Skull and upper cervical spine: The craniocervical junction is normal. Upper cervical spine is within normal limits. Marrow signal is unremarkable. Sinuses/Orbits: There is some fluid in the right mastoid air cells. No obstructing nasopharyngeal lesion is present. Mild mucosal thickening is present within anterior ethmoid air cells bilaterally as well as the inferior left frontal sinus. No fluid levels are present. The paranasal sinuses and mastoid air cells are otherwise clear. The globes and orbits are within normal limits. IMPRESSION: 1. Acute/subacute bilateral cortical infarction involving the left postcentral gyrus and corona radiata. 2. Additional punctate cortical infarct in the right parietal lobe. 3. Age advanced atrophy and white matter disease likely reflects the sequela of chronic microvascular ischemia. 4. Remote hemorrhagic lacunar infarcts of the right caudate head and right cerebellum. 5. Mild sinus disease as described. Electronically Signed   By: San Morelle M.D.   On: 11/20/2019 15:46   ECHOCARDIOGRAM  COMPLETE  Result Date: 11/21/2019   ECHOCARDIOGRAM REPORT   Patient Name:   Darren Pearson Date of Exam: 11/21/2019 Medical Rec #:  [id number]       Height:       68.0 in Accession #:    [id number]      Weight:       160.0 lb Date of Birth:  04-30-1966        BSA:          1.86 m Patient Age:    70 years        BP:           173/109 mmHg Patient Gender: M               HR:           79 bpm. Exam Location:  Inpatient Procedure: 2D Echo, Cardiac Doppler and Color Doppler Indications:    Stroke  History:        Patient has no prior history of Echocardiogram examinations.                 Risk Factors:Hypertension.  Sonographer:    Roseanna Rainbow RDCS Referring Phys: [id number] [place] Comments: Image acquisition challenging due to respiratory motion. IMPRESSIONS  1. Left ventricular ejection fraction, by visual estimation, is 45 to 50%. The left ventricle has low normal function. There is moderately increased left ventricular hypertrophy.  2. Left ventricular diastolic parameters are consistent with Grade I diastolic dysfunction (impaired relaxation).  3. The left ventricle demonstrates global hypokinesis.  4. Global right ventricle has mildly reduced systolic function.The right ventricular size is normal. No increase in right ventricular wall thickness.  5. Left atrial size was normal.  6. Right atrial size was normal.  7. The mitral valve is grossly normal. Trivial mitral valve regurgitation.  8. The tricuspid valve is normal in structure.  9. The aortic valve is tricuspid. Aortic valve regurgitation is not visualized. Mild aortic valve sclerosis without stenosis. 10. The pulmonic valve was grossly normal. Pulmonic valve regurgitation is not visualized. 11. The inferior vena cava is normal in size with greater than 50% respiratory variability, suggesting right atrial pressure of 3 mmHg. 12. The interatrial septum was not well visualized. FINDINGS  Left Ventricle: Left ventricular ejection fraction, by  visual estimation,  is 45 to 50%. The left ventricle has low normal function. The left ventricle demonstrates global hypokinesis. There is moderately increased left ventricular hypertrophy. Left ventricular diastolic parameters are consistent with Grade I diastolic dysfunction (impaired relaxation). Indeterminate filling pressures. Right Ventricle: The right ventricular size is normal. No increase in right ventricular wall thickness. Global RV systolic function is has mildly reduced systolic function. Left Atrium: Left atrial size was normal in size. Right Atrium: Right atrial size was normal in size Pericardium: There is no evidence of pericardial effusion. Mitral Valve: The mitral valve is grossly normal. Trivial mitral valve regurgitation. Tricuspid Valve: The tricuspid valve is normal in structure. Tricuspid valve regurgitation is not demonstrated. Aortic Valve: The aortic valve is tricuspid. Aortic valve regurgitation is not visualized. Mild aortic valve sclerosis is present, with no evidence of aortic valve stenosis. Pulmonic Valve: The pulmonic valve was grossly normal. Pulmonic valve regurgitation is not visualized. Pulmonic regurgitation is not visualized. Aorta: The aortic root and ascending aorta are structurally normal, with no evidence of dilitation. Venous: The inferior vena cava is normal in size with greater than 50% respiratory variability, suggesting right atrial pressure of 3 mmHg. IAS/Shunts: The interatrial septum was not well visualized.  LEFT VENTRICLE PLAX 2D LVIDd:         4.30 cm       Diastology LVIDs:         3.20 cm       LV e' lateral:   9.03 cm/s LV PW:         1.50 cm       LV E/e' lateral: 8.3 LV IVS:        1.30 cm       LV e' medial:    6.31 cm/s LVOT diam:     2.10 cm       LV E/e' medial:  11.9 LV SV:         42 ml LV SV Index:   22.48 LVOT Area:     3.46 cm  LV Volumes (MOD) LV area d, A2C:    30.70 cm LV area d, A4C:    28.70 cm LV area s, A2C:    20.70 cm LV area s, A4C:     19.30 cm LV major d, A2C:   8.27 cm LV major d, A4C:   8.49 cm LV major s, A2C:   7.52 cm LV major s, A4C:   7.19 cm LV vol d, MOD A2C: 95.5 ml LV vol d, MOD A4C: 80.0 ml LV vol s, MOD A2C: 48.8 ml LV vol s, MOD A4C: 43.0 ml LV SV MOD A2C:     46.7 ml LV SV MOD A4C:     80.0 ml LV SV MOD BP:      41.5 ml RIGHT VENTRICLE            IVC RV S prime:     9.44 cm/s  IVC diam: 1.40 cm TAPSE (M-mode): 1.9 cm LEFT ATRIUM             Index       RIGHT ATRIUM           Index LA diam:        2.00 cm 1.08 cm/m  RA Area:     10.90 cm LA Vol (A2C):   33.1 ml 17.81 ml/m RA Volume:   22.70 ml  12.21 ml/m LA Vol (A4C):   27.7 ml 14.90 ml/m LA Biplane Vol: 32.9 ml 17.70  ml/m  AORTIC VALVE LVOT Vmax:   83.60 cm/s LVOT Vmean:  56.400 cm/s LVOT VTI:    0.130 m  AORTA Ao Root diam: 3.50 cm Ao Asc diam:  3.20 cm MITRAL VALVE MV Area (PHT): 3.31 cm             SHUNTS MV PHT:        66.41 msec           Systemic VTI:  0.13 m MV Decel Time: 229 msec             Systemic Diam: 2.10 cm MV E velocity: 75.00 cm/s 103 cm/s MV A velocity: 80.60 cm/s 70.3 cm/s MV E/A ratio:  0.93       1.5  Lyman Bishop MD Electronically signed by Lyman Bishop MD Signature Date/Time: 11/21/2019/3:20:41 PM    Final    VAS [place] LOWER EXTREMITY VENOUS (DVT)  Result Date: 11/21/2019  Lower Venous Study Indications: Stroke.  Comparison Study: No prior study on file Performing Technologist: Sharion Dove RVS  Examination Guidelines: A complete evaluation includes B-mode imaging, spectral Doppler, color Doppler, and power Doppler as needed of all accessible portions of each vessel. Bilateral testing is considered an integral part of a complete examination. Limited examinations for reoccurring indications may be performed as noted.  +---------+---------------+---------+-----------+----------+--------------+  RIGHT     Compressibility Phasicity Spontaneity Properties Thrombus Aging  +---------+---------------+---------+-----------+----------+--------------+   CFV       Full            Yes       Yes                                    +---------+---------------+---------+-----------+----------+--------------+  SFJ       Full                                                             +---------+---------------+---------+-----------+----------+--------------+  FV Prox   Full                                                             +---------+---------------+---------+-----------+----------+--------------+  FV Mid    Full                                                             +---------+---------------+---------+-----------+----------+--------------+  FV Distal Full                                                             +---------+---------------+---------+-----------+----------+--------------+  PFV       Full                                                             +---------+---------------+---------+-----------+----------+--------------+  POP       Full            Yes       Yes                                    +---------+---------------+---------+-----------+----------+--------------+  PTV       Partial                                                          +---------+---------------+---------+-----------+----------+--------------+  PERO      Full                                                             +---------+---------------+---------+-----------+----------+--------------+   +---------+---------------+---------+-----------+----------+--------------+  LEFT      Compressibility Phasicity Spontaneity Properties Thrombus Aging  +---------+---------------+---------+-----------+----------+--------------+  CFV       Full            Yes       Yes                                    +---------+---------------+---------+-----------+----------+--------------+  SFJ       Full                                                             +---------+---------------+---------+-----------+----------+--------------+  FV Prox   Full                                                              +---------+---------------+---------+-----------+----------+--------------+  FV Mid    Full                                                             +---------+---------------+---------+-----------+----------+--------------+  FV Distal Full                                                             +---------+---------------+---------+-----------+----------+--------------+  PFV       Full                                                             +---------+---------------+---------+-----------+----------+--------------+  POP       Full            Yes       Yes                                    +---------+---------------+---------+-----------+----------+--------------+  PTV       Full                                                             +---------+---------------+---------+-----------+----------+--------------+  PERO      Full                                                             +---------+---------------+---------+-----------+----------+--------------+     Summary: Right: There is no evidence of deep vein thrombosis in the lower extremity. Left: There is no evidence of deep vein thrombosis in the lower extremity.  *See table(s) above for measurements and observations.    Preliminary     12-lead ECG 12/27 showed NSR at 86 bpm (personally reviewed) All prior EKG's in EPIC reviewed with no documented atrial fibrillation  Telemetry NSR 60-80s, occasional sinus tach (personally reviewed)  Assessment and Plan:  1. Cryptogenic stroke The patient presents with cryptogenic stroke.  The patient does have a TEE planned for this AM.  I spoke at length with the patient about monitoring for afib with an implantable loop recorder.  Risks, benefits, and alteratives to implantable loop recorder were discussed with the patient today.   At this time, the patient is very clear in their decision to proceed with implantable loop recorder.   2. Borderline EF TTE this admission  with EF 45-50% Will need follow up with general cardiology at discharge for follow up Would consider initiation of GDMT once clear to manage HTN per neurology.  Would initiate coreg and losartan as tolerated, and plan for outpatient follow up with gen cards for further.   Wound care was reviewed with the patient (keep incision clean and dry for 3 days).  Wound check scheduled and entered in AVS. Please call with questions.   Shirley Friar, PA-C 11/21/2019 8:29 PM  I have seen, examined the patient, and reviewed the above assessment and plan.  Changes to above are made where necessary.  On exam, RRR.  I agree with Dr Erlinda Hong that long term monitoring is indicated to evaluate for afib as the possible  Cause for his stroke.  Risks and benefits to ILR were discussed with the patient who wishes to proceed.  Co Sign: Thompson Grayer, MD 11/22/2019 2:49 PM

## 2019-11-21 NOTE — Evaluation (Signed)
Physical Therapy Evaluation Patient Details Name: Darren Pearson MRN: [id number] DOB: [birth year] Today's Date: 11/21/2019   History of Present Illness  Pt is 53 yo male with hep C and HTN who presents with over a week of poor balance and at least 3 falls. MRI brain revealed acute/subacute punctate bilateral cortical infarctions involving the right postcentral gyrus and left corona radiata, as well as an equivocal focus of restricted diffusion in the medial right pons suggestive of acute infarction vs T2-shine through artifact. Also noted are remote hemorrhagic lacunar infarcts of the right caudate head and right cerebellum. Chronic small vessel ischemic changes and mild atrophy also noted.   Clinical Impression  Pt admitted with above diagnosis. Pt presents with LLE ataxia and mild proprioceptive issues. Min A for balance with ambulation with decreased L foot clearance and L lean. Pt does not have transportation so currently recommending HHPT after d/c. Can try a cane next visit to see if that increases his safety. Does not need as much support as a RW.  Pt currently with functional limitations due to the deficits listed below (see PT Problem List). Pt will benefit from skilled PT to increase their independence and safety with mobility to allow discharge to the venue listed below.       Follow Up Recommendations Home health PT    Equipment Recommendations  Other (comment)(TBD)    Recommendations for Other Services       Precautions / Restrictions Precautions Precautions: Fall Precaution Comments: 3 falls at home before coming in Restrictions Weight Bearing Restrictions: No      Mobility  Bed Mobility Overal bed mobility: Modified Independent                Transfers Overall transfer level: Needs assistance   Transfers: Sit to/from Stand Sit to Stand: Supervision         General transfer comment: supervision for safety  Ambulation/Gait Ambulation/Gait assistance:  Min assist Gait Distance (Feet): 250 Feet Assistive device: None Gait Pattern/deviations: Step-through pattern;Ataxic;Staggering left Gait velocity: decreased Gait velocity interpretation: <1.8 ft/sec, indicate of risk for recurrent falls General Gait Details: consistent L lean but not out of BOS. Decreased foot clearance L and mild ataxia LLE noted. Min A to prevent fall.   Stairs Stairs: Yes Stairs assistance: Min guard Stair Management: One rail Right;Alternating pattern;Forwards Number of Stairs: 5 General stair comments: able to ascend with alternating pattern with use of rail, visibly more effort needed when leading up with LLE  Wheelchair Mobility    Modified Rankin (Stroke Patients Only) Modified Rankin (Stroke Patients Only) Pre-Morbid Rankin Score: No significant disability Modified Rankin: Moderately severe disability     Balance Overall balance assessment: Needs assistance Sitting-balance support: Feet supported;No upper extremity supported Sitting balance-Leahy Scale: Good     Standing balance support: Single extremity supported Standing balance-Leahy Scale: Fair Standing balance comment: limitations evident with perturbation                             Pertinent Vitals/Pain Pain Assessment: No/denies pain    Home Living Family/patient expects to be discharged to:: Private residence Living Arrangements: Other relatives Available Help at Discharge: Family;Available 24 hours/day Type of Home: House Home Access: Level entry     Home Layout: Two level Home Equipment: None Additional Comments: pt lives with his sister (who is home all of the time right now because she just had surgery for breast cancer). His stairs have  a rail but he pulled half of it off the wall when he fell down the stairs PTA.    Prior Function Level of Independence: Independent         Comments: pt can drive but does not have a car, was taking Melburn Popper to work at [employer].  Sister does not currently drive either      Hand Dominance   Dominant Hand: Left(missing 3 L fingers (many years ago))    Extremity/Trunk Assessment   Upper Extremity Assessment Upper Extremity Assessment: Defer to OT evaluation;LUE deficits/detail LUE Deficits / Details: pt reports it has taken him increased time to drop his cig ashes in the ash tray with his L hand    Lower Extremity Assessment Lower Extremity Assessment: LLE deficits/detail LLE Deficits / Details: hip flex 4/5, knee ext 4+/5, knee flex 4+/5, ataxic motion noted on exam. Pt L handed and usually stronger LLE than R but on eval R 5/5 throughout LLE Sensation: decreased proprioception LLE Coordination: decreased gross motor    Cervical / Trunk Assessment Cervical / Trunk Assessment: Normal  Communication   Communication: No difficulties  Cognition Arousal/Alertness: Awake/alert Behavior During Therapy: WFL for tasks assessed/performed Overall Cognitive Status: Within Functional Limits for tasks assessed                                        General Comments General comments (skin integrity, edema, etc.): discussed smoking and drug cessation    Exercises     Assessment/Plan    PT Assessment Patient needs continued PT services  PT Problem List Decreased strength;Decreased balance;Decreased mobility;Decreased coordination;Decreased knowledge of use of DME;Decreased knowledge of precautions       PT Treatment Interventions DME instruction;Gait training;Stair training;Functional mobility training;Therapeutic activities;Therapeutic exercise;Balance training;Patient/family education    PT Goals (Current goals can be found in the Care Plan section)  Acute Rehab PT Goals Patient Stated Goal: return to hoem and work PT Goal Formulation: With patient Time For Goal Achievement: 12/05/19 Potential to Achieve Goals: Good    Frequency Min 4X/week   Barriers to discharge Inaccessible home  environment flight of stairs to bedroom    Co-evaluation               AM-PAC PT "6 Clicks" Mobility  Outcome Measure Help needed turning from your back to your side while in a flat bed without using bedrails?: None Help needed moving from lying on your back to sitting on the side of a flat bed without using bedrails?: None Help needed moving to and from a bed to a chair (including a wheelchair)?: A Little Help needed standing up from a chair using your arms (e.g., wheelchair or bedside chair)?: A Little Help needed to walk in hospital room?: A Little Help needed climbing 3-5 steps with a railing? : A Little 6 Click Score: 20    End of Session Equipment Utilized During Treatment: Gait belt Activity Tolerance: Patient tolerated treatment well Patient left: in bed;with call bell/phone within reach;with bed alarm set Nurse Communication: Mobility status PT Visit Diagnosis: Unsteadiness on feet (R26.81);Repeated falls (R29.6)    Time: [id number] PT Time Calculation (min) (ACUTE ONLY): 33 min   Charges:   PT Evaluation $PT Eval Moderate Complexity: 1 Mod PT Treatments $Gait Training: 8-22 mins        Leighton Roach, PT  Acute Rehab Services  Pager [phone] Office [phone]   [place]  L Kalven Ganim 11/21/2019, 1:53 PM

## 2019-11-21 NOTE — Consult Note (Addendum)
ELECTROPHYSIOLOGY CONSULT NOTE  Patient ID: Darren Pearson MRN: [id number], DOB/AGE: Oct 01, 1966   Admit date: 11/20/2019 Date of Consult: 11/22/19   Primary Physician: Jearld Fenton, NP Primary Cardiologist: No primary care provider on file.  Primary Electrophysiologist: New to Dr. Rayann Heman Reason for Consultation: Cryptogenic stroke; recommendations regarding Implantable Loop Recorder  History of Present Illness EP has been asked to evaluate Darren Pearson for placement of an implantable loop recorder to monitor for atrial fibrillation by Dr Erlinda Hong.  The patient was admitted on 11/20/2019 with poor balance for 1.5 weeks and 3 falls with unsteadiness that would resolve with rest. MRI showed acute punctate bilateral cortical infarcts. Outside of TPA window. They have undergone workup for stroke including echocardiogram and CTA Head and Neck which showed small paraclinoid L ICA aneursym. LE dopplers with no DVTs The patient has been monitored on telemetry which has demonstrated sinus rhythm with no arrhythmias.  Inpatient stroke work-up will require a TEE per Neurology.   Echocardiogram this admission demonstrated LVEF 45-50%.  Lab work is reviewed.  Prior to admission, the patient denies chest pain, shortness of breath, dizziness, palpitations, or syncope.  They are recovering from their stroke with plans to return home  at discharge.  Past Medical History:  Diagnosis Date  . Allergy   . Anxiety and depression   . Asthma   . Hepatitis C   . History of chicken pox   . Hypertension      Surgical History: History reviewed. No pertinent surgical history.   Medications Prior to Admission  Medication Sig Dispense Refill Last Dose  . acetaminophen (TYLENOL) 325 MG tablet Take 650 mg by mouth every 6 (six) hours as needed for mild pain or headache.   Past Week at Unknown time  . polyvinyl alcohol (LIQUIFILM TEARS) 1.4 % ophthalmic solution Place 1 drop into both eyes as needed for dry eyes.    Past Month at Unknown time  . azithromycin (ZITHROMAX) 250 MG tablet Take 1 tablet (250 mg total) by mouth daily. Take first 2 tablets together, then 1 every day until finished. (Patient not taking: Reported on 11/20/2019) 6 tablet 0 Not Taking at Unknown time  . doxycycline (VIBRAMYCIN) 100 MG capsule Take 1 capsule (100 mg total) by mouth 2 (two) times daily. (Patient not taking: Reported on 11/20/2019) 20 capsule 0 Not Taking at Unknown time  . levofloxacin (LEVAQUIN) 500 MG tablet Take 1 tablet (500 mg total) by mouth daily. (Patient not taking: Reported on 05/14/2016) 9 tablet 0 Not Taking at Unknown time  . ondansetron (ZOFRAN ODT) 4 MG disintegrating tablet Take 1 tablet (4 mg total) by mouth every 8 (eight) hours as needed for nausea or vomiting. (Patient not taking: Reported on 05/14/2016) 10 tablet 0 Not Taking at Unknown time  . oxyCODONE-acetaminophen (PERCOCET) 5-325 MG tablet Take 1-2 tablets by mouth every 4 (four) hours as needed. (Patient not taking: Reported on 11/20/2019) 10 tablet 0 Not Taking at Unknown time  . predniSONE (DELTASONE) 10 MG tablet Take 2 tablets (20 mg total) by mouth 2 (two) times daily. (Patient not taking: Reported on 11/20/2019) 20 tablet 0 Not Taking at Unknown time    Inpatient Medications:  .  stroke: mapping our early stages of recovery book   Does not apply Once  . aspirin EC  81 mg Oral Daily  . atorvastatin  40 mg Oral q1800  . clopidogrel  75 mg Oral Daily  . enoxaparin (LOVENOX) injection  40 mg  Subcutaneous Q24H  . nicotine  14 mg Transdermal Daily  . [START ON 11/22/2019] vitamin B-12  1,000 mcg Oral Daily    Allergies:  Allergies  Allergen Reactions  . Sulfa Antibiotics Hives    Social History   Socioeconomic History  . Marital status: Single    Spouse name: Not on file  . Number of children: Not on file  . Years of education: 58  . Highest education level: Not on file  Occupational History  . Occupation: Solicitor: [employer]  Tobacco Use  . Smoking status: Current Some Day Smoker    Packs/day: 0.25    Types: Cigarettes    Start date: 11/24/2012  . Smokeless tobacco: Never Used  Substance and Sexual Activity  . Alcohol use: Yes    Alcohol/week: 2.0 standard drinks    Types: 2 Cans of beer per week    Comment: social  . Drug use: Yes    Frequency: 5.0 times per week    Types: Marijuana  . Sexual activity: Yes    Birth control/protection: Condom  Other Topics Concern  . Not on file  Social History Narrative   Regular exercise-yes   Social Determinants of Health   Financial Resource Strain:   . Difficulty of Paying Living Expenses: Not on file  Food Insecurity:   . Worried About Charity fundraiser in the Last Year: Not on file  . Ran Out of Food in the Last Year: Not on file  Transportation Needs:   . Lack of Transportation (Medical): Not on file  . Lack of Transportation (Non-Medical): Not on file  Physical Activity:   . Days of Exercise per Week: Not on file  . Minutes of Exercise per Session: Not on file  Stress:   . Feeling of Stress : Not on file  Social Connections:   . Frequency of Communication with Friends and Family: Not on file  . Frequency of Social Gatherings with Friends and Family: Not on file  . Attends Religious Services: Not on file  . Active Member of Clubs or Organizations: Not on file  . Attends Archivist Meetings: Not on file  . Marital Status: Not on file  Intimate Partner Violence:   . Fear of Current or Ex-Partner: Not on file  . Emotionally Abused: Not on file  . Physically Abused: Not on file  . Sexually Abused: Not on file     Family History  Problem Relation Age of Onset  . Stomach cancer Mother   . Diabetes Father   . Paget's disease of bone Father       Review of Systems: All other systems reviewed and are otherwise negative except as noted above.  Physical Exam: Vitals:   11/21/19 0738 11/21/19 1201  11/21/19 1556 11/21/19 2017  BP: (!) 173/109 (!) 177/116 (!) 166/100 (!) 181/111  Pulse: 83 80 75 82  Resp: 19 20 20 17   Temp: 98.5 F (36.9 C) 98.3 F (36.8 C) 98.7 F (37.1 C) 98.1 F (36.7 C)  TempSrc: Oral Oral Oral Oral  SpO2: 100% 100% 99% 100%  Weight:      Height:        GEN- The patient is well appearing, alert and oriented x 3 today.   Head- normocephalic, atraumatic Eyes-  Sclera clear, conjunctiva pink Ears- hearing intact Oropharynx- clear Neck- supple Lungs- Clear to ausculation bilaterally, normal work of breathing Heart- Regular rate and rhythm, no murmurs,  rubs or gallops  GI- soft, NT, ND, + BS Extremities- no clubbing, cyanosis, or edema MS- no significant deformity or atrophy Skin- no rash or lesion Psych- euthymic mood, full affect   Labs:   Lab Results  Component Value Date   WBC 6.4 11/20/2019   HGB 12.2 (L) 11/20/2019   HCT 38.0 (L) 11/20/2019   MCV 95.5 11/20/2019   PLT 232 11/20/2019    Recent Labs  Lab 11/20/19 1006 11/21/19 1317  NA 140 140  K 3.1* 3.7  CL 104 106  CO2 26 27  BUN 13 10  CREATININE 1.27* 1.19  CALCIUM 8.8* 8.7*  PROT 8.0  --   BILITOT 1.0  --   ALKPHOS 89  --   ALT 13  --   AST 24  --   GLUCOSE 91 95     Radiology/Studies: CT ANGIO HEAD W OR WO CONTRAST  Result Date: 11/21/2019 CLINICAL DATA:  Stroke, follow-up EXAM: CT ANGIOGRAPHY HEAD AND NECK TECHNIQUE: Multidetector CT imaging of the head and neck was performed using the standard protocol during bolus administration of intravenous contrast. Multiplanar CT image reconstructions and MIPs were obtained to evaluate the vascular anatomy. Carotid stenosis measurements (when applicable) are obtained utilizing NASCET criteria, using the distal internal carotid diameter as the denominator. CONTRAST:  25mL OMNIPAQUE IOHEXOL 350 MG/ML SOLN COMPARISON:  None. FINDINGS: CTA NECK FINDINGS Aortic arch: Great vessel origins are patent. Right carotid system: Common,  internal, and external carotid arteries are patent. There is minor plaque without measurable stenosis at the ICA origin. Left carotid system: Common, internal, and external carotid arteries are patent. There is minor plaque without measurable stenosis at the ICA origin. Vertebral arteries: Patent. Right vertebral artery is dominant. There is focal moderate stenosis of the proximal right vertebral artery just beyond the origin. Skeleton: Degenerative changes are present, greatest at C6-C7. Other neck: No neck mass or adenopathy. Upper chest: Bullous changes at the lung apices. Paraseptal and centrilobular emphysema. Review of the MIP images confirms the above findings CTA HEAD FINDINGS Anterior circulation: Intracranial internal carotid arteries are patent with mild calcified plaque. There is a 2 x 1 mm laterally directed outpouching from the paraclinoid left ICA. Anterior and middle cerebral arteries are patent. Posterior circulation: Intracranial right vertebral artery is patent with mild calcified and noncalcified plaque. Left vertebral artery terminates as a PICA. Basilar artery is patent. Posterior cerebral arteries are patent. Right larger than left posterior communicating arteries are present. Venous sinuses: As permitted by contrast timing, patent. Anatomic variants: Fetal origin of the right posterior cerebral artery. Review of the MIP images confirms the above findings IMPRESSION: No large vessel occlusion or hemodynamically significant stenosis. Small aneurysm of the paraclinoid left ICA. Electronically Signed   By: Macy Mis M.D.   On: 11/21/2019 10:06   DG Chest 2 View  Result Date: 11/20/2019 CLINICAL DATA:  Status post fall on left ribs. Shortness of breath. EXAM: CHEST - 2 VIEW COMPARISON:  August 12, 2016 FINDINGS: The heart size and mediastinal contours are within normal limits. Both lungs are clear. The lungs are hyperinflated. The visualized skeletal structures are unremarkable. The  visualized left ribs demonstrate no acute fracture or dislocation. IMPRESSION: 1. No active cardiopulmonary disease.  COPD. 2. No acute fracture or dislocation of left ribs. Electronically Signed   By: Abelardo Diesel M.D.   On: 11/20/2019 10:03   CT Head Wo Contrast  Result Date: 11/20/2019 CLINICAL DATA:  Off balance for several weeks. Multiple  falls. EXAM: CT HEAD WITHOUT CONTRAST TECHNIQUE: Contiguous axial images were obtained from the base of the skull through the vertex without intravenous contrast. COMPARISON:  None. FINDINGS: Brain: No evidence of acute infarction, hemorrhage, hydrocephalus, extra-axial collection or mass lesion/mass effect. There is chronic diffuse atrophy. Chronic bilateral periventricular white matter small vessel ischemic changes are noted. Small old lacunar infarction is identified in the right basal ganglia. Vascular: No hyperdense vessel is noted. Skull: Normal. Negative for fracture or focal lesion. Sinuses/Orbits: No acute finding. Other: None. IMPRESSION: 1. No focal acute intracranial abnormality identified. 2. Chronic diffuse atrophy. Chronic bilateral periventricular white matter small vessel ischemic change. Electronically Signed   By: Abelardo Diesel M.D.   On: 11/20/2019 10:20   CT ANGIO NECK W OR WO CONTRAST  Result Date: 11/21/2019 CLINICAL DATA:  Stroke, follow-up EXAM: CT ANGIOGRAPHY HEAD AND NECK TECHNIQUE: Multidetector CT imaging of the head and neck was performed using the standard protocol during bolus administration of intravenous contrast. Multiplanar CT image reconstructions and MIPs were obtained to evaluate the vascular anatomy. Carotid stenosis measurements (when applicable) are obtained utilizing NASCET criteria, using the distal internal carotid diameter as the denominator. CONTRAST:  54mL OMNIPAQUE IOHEXOL 350 MG/ML SOLN COMPARISON:  None. FINDINGS: CTA NECK FINDINGS Aortic arch: Great vessel origins are patent. Right carotid system: Common, internal,  and external carotid arteries are patent. There is minor plaque without measurable stenosis at the ICA origin. Left carotid system: Common, internal, and external carotid arteries are patent. There is minor plaque without measurable stenosis at the ICA origin. Vertebral arteries: Patent. Right vertebral artery is dominant. There is focal moderate stenosis of the proximal right vertebral artery just beyond the origin. Skeleton: Degenerative changes are present, greatest at C6-C7. Other neck: No neck mass or adenopathy. Upper chest: Bullous changes at the lung apices. Paraseptal and centrilobular emphysema. Review of the MIP images confirms the above findings CTA HEAD FINDINGS Anterior circulation: Intracranial internal carotid arteries are patent with mild calcified plaque. There is a 2 x 1 mm laterally directed outpouching from the paraclinoid left ICA. Anterior and middle cerebral arteries are patent. Posterior circulation: Intracranial right vertebral artery is patent with mild calcified and noncalcified plaque. Left vertebral artery terminates as a PICA. Basilar artery is patent. Posterior cerebral arteries are patent. Right larger than left posterior communicating arteries are present. Venous sinuses: As permitted by contrast timing, patent. Anatomic variants: Fetal origin of the right posterior cerebral artery. Review of the MIP images confirms the above findings IMPRESSION: No large vessel occlusion or hemodynamically significant stenosis. Small aneurysm of the paraclinoid left ICA. Electronically Signed   By: Macy Mis M.D.   On: 11/21/2019 10:06   MR BRAIN WO CONTRAST  Result Date: 11/20/2019 CLINICAL DATA:  Off balance for several weeks. Multiple falls. Stroke, follow-up. EXAM: MRI HEAD WITHOUT CONTRAST TECHNIQUE: Multiplanar, multiecho pulse sequences of the brain and surrounding structures were obtained without intravenous contrast. COMPARISON:  CT head without contrast 11/20/2019 FINDINGS:  Brain: The diffusion-weighted images demonstrate bilateral acute cortical infarction. An area of focal restricted diffusion is present in the right left postcentral gyrus and corona radiata. A punctate cortical focus of restricted diffusion is present in the right parietal lobe on image 34 of series 4. Age advanced atrophy and white matter disease is present. A remote hemorrhagic lacunar infarct is present in the right caudate head. Remote nonhemorrhagic infarcts present in the thalami bilaterally. Diffuse confluent white matter disease is present bilaterally with multiple remote lacunar infarcts  in the corona radiata bilaterally. The ventricles are proportionate to the degree of atrophy. Multiple remote nonhemorrhagic lacunar infarcts are present within the pons. Remote lacunar infarcts are present in the right cerebellum. Vascular: Flow is present in the major intracranial arteries. Skull and upper cervical spine: The craniocervical junction is normal. Upper cervical spine is within normal limits. Marrow signal is unremarkable. Sinuses/Orbits: There is some fluid in the right mastoid air cells. No obstructing nasopharyngeal lesion is present. Mild mucosal thickening is present within anterior ethmoid air cells bilaterally as well as the inferior left frontal sinus. No fluid levels are present. The paranasal sinuses and mastoid air cells are otherwise clear. The globes and orbits are within normal limits. IMPRESSION: 1. Acute/subacute bilateral cortical infarction involving the left postcentral gyrus and corona radiata. 2. Additional punctate cortical infarct in the right parietal lobe. 3. Age advanced atrophy and white matter disease likely reflects the sequela of chronic microvascular ischemia. 4. Remote hemorrhagic lacunar infarcts of the right caudate head and right cerebellum. 5. Mild sinus disease as described. Electronically Signed   By: San Morelle M.D.   On: 11/20/2019 15:46   ECHOCARDIOGRAM  COMPLETE  Result Date: 11/21/2019   ECHOCARDIOGRAM REPORT   Patient Name:   DAITON BETSINGER Date of Exam: 11/21/2019 Medical Rec #:  [id number]       Height:       68.0 in Accession #:    [id number]      Weight:       160.0 lb Date of Birth:  [birth year]        BSA:          1.86 m Patient Age:    16 years        BP:           173/109 mmHg Patient Gender: M               HR:           79 bpm. Exam Location:  Inpatient Procedure: 2D Echo, Cardiac Doppler and Color Doppler Indications:    Stroke  History:        Patient has no prior history of Echocardiogram examinations.                 Risk Factors:Hypertension.  Sonographer:    Roseanna Rainbow RDCS Referring Phys: [id number] Liborio Negron Torres Comments: Image acquisition challenging due to respiratory motion. IMPRESSIONS  1. Left ventricular ejection fraction, by visual estimation, is 45 to 50%. The left ventricle has low normal function. There is moderately increased left ventricular hypertrophy.  2. Left ventricular diastolic parameters are consistent with Grade I diastolic dysfunction (impaired relaxation).  3. The left ventricle demonstrates global hypokinesis.  4. Global right ventricle has mildly reduced systolic function.The right ventricular size is normal. No increase in right ventricular wall thickness.  5. Left atrial size was normal.  6. Right atrial size was normal.  7. The mitral valve is grossly normal. Trivial mitral valve regurgitation.  8. The tricuspid valve is normal in structure.  9. The aortic valve is tricuspid. Aortic valve regurgitation is not visualized. Mild aortic valve sclerosis without stenosis. 10. The pulmonic valve was grossly normal. Pulmonic valve regurgitation is not visualized. 11. The inferior vena cava is normal in size with greater than 50% respiratory variability, suggesting right atrial pressure of 3 mmHg. 12. The interatrial septum was not well visualized. FINDINGS  Left Ventricle: Left ventricular ejection fraction, by  visual estimation,  is 45 to 50%. The left ventricle has low normal function. The left ventricle demonstrates global hypokinesis. There is moderately increased left ventricular hypertrophy. Left ventricular diastolic parameters are consistent with Grade I diastolic dysfunction (impaired relaxation). Indeterminate filling pressures. Right Ventricle: The right ventricular size is normal. No increase in right ventricular wall thickness. Global RV systolic function is has mildly reduced systolic function. Left Atrium: Left atrial size was normal in size. Right Atrium: Right atrial size was normal in size Pericardium: There is no evidence of pericardial effusion. Mitral Valve: The mitral valve is grossly normal. Trivial mitral valve regurgitation. Tricuspid Valve: The tricuspid valve is normal in structure. Tricuspid valve regurgitation is not demonstrated. Aortic Valve: The aortic valve is tricuspid. Aortic valve regurgitation is not visualized. Mild aortic valve sclerosis is present, with no evidence of aortic valve stenosis. Pulmonic Valve: The pulmonic valve was grossly normal. Pulmonic valve regurgitation is not visualized. Pulmonic regurgitation is not visualized. Aorta: The aortic root and ascending aorta are structurally normal, with no evidence of dilitation. Venous: The inferior vena cava is normal in size with greater than 50% respiratory variability, suggesting right atrial pressure of 3 mmHg. IAS/Shunts: The interatrial septum was not well visualized.  LEFT VENTRICLE PLAX 2D LVIDd:         4.30 cm       Diastology LVIDs:         3.20 cm       LV e' lateral:   9.03 cm/s LV PW:         1.50 cm       LV E/e' lateral: 8.3 LV IVS:        1.30 cm       LV e' medial:    6.31 cm/s LVOT diam:     2.10 cm       LV E/e' medial:  11.9 LV SV:         42 ml LV SV Index:   22.48 LVOT Area:     3.46 cm  LV Volumes (MOD) LV area d, A2C:    30.70 cm LV area d, A4C:    28.70 cm LV area s, A2C:    20.70 cm LV area s, A4C:     19.30 cm LV major d, A2C:   8.27 cm LV major d, A4C:   8.49 cm LV major s, A2C:   7.52 cm LV major s, A4C:   7.19 cm LV vol d, MOD A2C: 95.5 ml LV vol d, MOD A4C: 80.0 ml LV vol s, MOD A2C: 48.8 ml LV vol s, MOD A4C: 43.0 ml LV SV MOD A2C:     46.7 ml LV SV MOD A4C:     80.0 ml LV SV MOD BP:      41.5 ml RIGHT VENTRICLE            IVC RV S prime:     9.44 cm/s  IVC diam: 1.40 cm TAPSE (M-mode): 1.9 cm LEFT ATRIUM             Index       RIGHT ATRIUM           Index LA diam:        2.00 cm 1.08 cm/m  RA Area:     10.90 cm LA Vol (A2C):   33.1 ml 17.81 ml/m RA Volume:   22.70 ml  12.21 ml/m LA Vol (A4C):   27.7 ml 14.90 ml/m LA Biplane Vol: 32.9 ml 17.70  ml/m  AORTIC VALVE LVOT Vmax:   83.60 cm/s LVOT Vmean:  56.400 cm/s LVOT VTI:    0.130 m  AORTA Ao Root diam: 3.50 cm Ao Asc diam:  3.20 cm MITRAL VALVE MV Area (PHT): 3.31 cm             SHUNTS MV PHT:        66.41 msec           Systemic VTI:  0.13 m MV Decel Time: 229 msec             Systemic Diam: 2.10 cm MV E velocity: 75.00 cm/s 103 cm/s MV A velocity: 80.60 cm/s 70.3 cm/s MV E/A ratio:  0.93       1.5  Lyman Bishop MD Electronically signed by Lyman Bishop MD Signature Date/Time: 11/21/2019/3:20:41 PM    Final    VAS [place] LOWER EXTREMITY VENOUS (DVT)  Result Date: 11/21/2019  Lower Venous Study Indications: Stroke.  Comparison Study: No prior study on file Performing Technologist: Sharion Dove RVS  Examination Guidelines: A complete evaluation includes B-mode imaging, spectral Doppler, color Doppler, and power Doppler as needed of all accessible portions of each vessel. Bilateral testing is considered an integral part of a complete examination. Limited examinations for reoccurring indications may be performed as noted.  +---------+---------------+---------+-----------+----------+--------------+ RIGHT    CompressibilityPhasicitySpontaneityPropertiesThrombus Aging +---------+---------------+---------+-----------+----------+--------------+  CFV      Full           Yes      Yes                                 +---------+---------------+---------+-----------+----------+--------------+ SFJ      Full                                                        +---------+---------------+---------+-----------+----------+--------------+ FV Prox  Full                                                        +---------+---------------+---------+-----------+----------+--------------+ FV Mid   Full                                                        +---------+---------------+---------+-----------+----------+--------------+ FV DistalFull                                                        +---------+---------------+---------+-----------+----------+--------------+ PFV      Full                                                        +---------+---------------+---------+-----------+----------+--------------+ POP  Full           Yes      Yes                                 +---------+---------------+---------+-----------+----------+--------------+ PTV      Partial                                                     +---------+---------------+---------+-----------+----------+--------------+ PERO     Full                                                        +---------+---------------+---------+-----------+----------+--------------+   +---------+---------------+---------+-----------+----------+--------------+ LEFT     CompressibilityPhasicitySpontaneityPropertiesThrombus Aging +---------+---------------+---------+-----------+----------+--------------+ CFV      Full           Yes      Yes                                 +---------+---------------+---------+-----------+----------+--------------+ SFJ      Full                                                        +---------+---------------+---------+-----------+----------+--------------+ FV Prox  Full                                                         +---------+---------------+---------+-----------+----------+--------------+ FV Mid   Full                                                        +---------+---------------+---------+-----------+----------+--------------+ FV DistalFull                                                        +---------+---------------+---------+-----------+----------+--------------+ PFV      Full                                                        +---------+---------------+---------+-----------+----------+--------------+ POP      Full           Yes      Yes                                 +---------+---------------+---------+-----------+----------+--------------+  PTV      Full                                                        +---------+---------------+---------+-----------+----------+--------------+ PERO     Full                                                        +---------+---------------+---------+-----------+----------+--------------+     Summary: Right: There is no evidence of deep vein thrombosis in the lower extremity. Left: There is no evidence of deep vein thrombosis in the lower extremity.  *See table(s) above for measurements and observations.    Preliminary     12-lead ECG 12/27 showed NSR at 86 bpm (personally reviewed) All prior EKG's in EPIC reviewed with no documented atrial fibrillation  Telemetry NSR 60-80s, occasional sinus tach (personally reviewed)  Assessment and Plan:  1. Cryptogenic stroke The patient presents with cryptogenic stroke.  The patient does have a TEE planned for this AM.  I spoke at length with the patient about monitoring for afib with an implantable loop recorder.  Risks, benefits, and alteratives to implantable loop recorder were discussed with the patient today.   At this time, the patient is very clear in their decision to proceed with implantable loop recorder.   2. Borderline EF TTE this admission  with EF 45-50% Will need follow up with general cardiology at discharge for follow up Would consider initiation of GDMT once clear to manage HTN per neurology.  Would initiate coreg and losartan as tolerated, and plan for outpatient follow up with gen cards for further.   Wound care was reviewed with the patient (keep incision clean and dry for 3 days).  Wound check scheduled and entered in AVS. Please call with questions.   Shirley Friar, PA-C 11/21/2019 8:29 PM  I have seen, examined the patient, and reviewed the above assessment and plan.  Changes to above are made where necessary.  On exam, RRR.  I agree with Dr Erlinda Hong that long term monitoring is indicated to evaluate for afib as the possible  Cause for his stroke.  Risks and benefits to ILR were discussed with the patient who wishes to proceed.  Co Sign: Thompson Grayer, MD 11/22/2019 2:49 PM

## 2019-11-22 ENCOUNTER — Encounter (HOSPITAL_COMMUNITY)
Admission: EM | Disposition: A | Discharge status: Home Health | Payer: Self-pay | Source: Home / Self Care | Attending: Family Medicine

## 2019-11-22 ENCOUNTER — Inpatient Hospital Stay (HOSPITAL_COMMUNITY): Payer: Medicaid Other

## 2019-11-22 ENCOUNTER — Encounter (HOSPITAL_COMMUNITY): Payer: Self-pay | Admitting: Internal Medicine

## 2019-11-22 DIAGNOSIS — F17200 Nicotine dependence, unspecified, uncomplicated: Secondary | ICD-10-CM

## 2019-11-22 DIAGNOSIS — I6389 Other cerebral infarction: Secondary | ICD-10-CM

## 2019-11-22 DIAGNOSIS — I340 Nonrheumatic mitral (valve) insufficiency: Secondary | ICD-10-CM

## 2019-11-22 DIAGNOSIS — E538 Deficiency of other specified B group vitamins: Secondary | ICD-10-CM

## 2019-11-22 DIAGNOSIS — R7989 Other specified abnormal findings of blood chemistry: Secondary | ICD-10-CM

## 2019-11-22 DIAGNOSIS — F1411 Cocaine abuse, in remission: Secondary | ICD-10-CM

## 2019-11-22 HISTORY — PX: TEE WITHOUT CARDIOVERSION: SHX5443

## 2019-11-22 HISTORY — PX: BUBBLE STUDY: SHX6837

## 2019-11-22 HISTORY — PX: LOOP RECORDER INSERTION: EP1214

## 2019-11-22 LAB — LIPID PANEL
Cholesterol: 139 mg/dL (ref 0–200)
HDL: 54 mg/dL (ref >40)
LDL Cholesterol: 74 mg/dL (ref 0–99)
Total CHOL/HDL Ratio: 2.6 RATIO
Triglycerides: 53 mg/dL (ref <150)
VLDL: 11 mg/dL (ref 0–40)

## 2019-11-22 LAB — CARDIOLIPIN ANTIBODIES, IGG, IGM, IGA
Anticardiolipin IgA: <9 APL U/mL (ref 0–11)
Anticardiolipin IgG: <9 GPL U/mL (ref 0–14)
Anticardiolipin IgM: <9 MPL U/mL (ref 0–12)

## 2019-11-22 LAB — LUPUS ANTICOAGULANT PANEL
DRVVT: 40.2 s (ref 0.0–47.0)
PTT Lupus Anticoagulant: 38.1 s (ref 0.0–51.9)

## 2019-11-22 LAB — HEMOGLOBIN A1C
Hgb A1c MFr Bld: 4.2 % — ABNORMAL LOW (ref 4.8–5.6)
Mean Plasma Glucose: 73.84 mg/dL

## 2019-11-22 LAB — BETA-2-GLYCOPROTEIN I ABS, IGG/M/A
Beta-2 Glyco I IgG: <9 GPI IgG units (ref 0–20)
Beta-2-Glycoprotein I IgA: <9 GPI IgA units (ref 0–25)
Beta-2-Glycoprotein I IgM: <9 GPI IgM units (ref 0–32)

## 2019-11-22 LAB — RAPID HIV SCREEN (HIV 1/2 AB+AG)
HIV 1/2 Antibodies: NONREACTIVE
HIV-1 P24 Antigen - HIV24: NONREACTIVE

## 2019-11-22 LAB — HOMOCYSTEINE: Homocysteine: 11.8 umol/L (ref 0.0–14.5)

## 2019-11-22 LAB — RPR: RPR Ser Ql: NONREACTIVE

## 2019-11-22 SURGERY — ECHOCARDIOGRAM, TRANSESOPHAGEAL
Anesthesia: Moderate Sedation

## 2019-11-22 SURGERY — LOOP RECORDER INSERTION

## 2019-11-22 MED ORDER — HYDRALAZINE HCL 20 MG/ML IJ SOLN
INTRAMUSCULAR | Status: DC | PRN
  Administered 2019-11-22: 10 mg via INTRAVENOUS

## 2019-11-22 MED ORDER — FENTANYL CITRATE (PF) 100 MCG/2ML IJ SOLN
INTRAMUSCULAR | Status: DC | PRN
  Administered 2019-11-22: 25 ug via INTRAVENOUS

## 2019-11-22 MED ORDER — BUTAMBEN-TETRACAINE-BENZOCAINE 2-2-14 % EX AERO
INHALATION_SPRAY | CUTANEOUS | Status: DC | PRN
  Administered 2019-11-22: 2 via TOPICAL

## 2019-11-22 MED ORDER — FENTANYL CITRATE (PF) 100 MCG/2ML IJ SOLN
INTRAMUSCULAR | Status: AC
  Filled 2019-11-22: qty 2

## 2019-11-22 MED ORDER — MIDAZOLAM HCL (PF) 10 MG/2ML IJ SOLN
INTRAMUSCULAR | Status: DC | PRN
  Administered 2019-11-22 (×3): 2 mg via INTRAVENOUS

## 2019-11-22 MED ORDER — LIDOCAINE-EPINEPHRINE 1 %-1:100000 IJ SOLN
INTRAMUSCULAR | Status: AC
  Filled 2019-11-22: qty 1

## 2019-11-22 MED ORDER — MIDAZOLAM HCL (PF) 5 MG/ML IJ SOLN
INTRAMUSCULAR | Status: AC
  Filled 2019-11-22: qty 2

## 2019-11-22 MED ORDER — SODIUM CHLORIDE 0.9 % IV SOLN: INTRAVENOUS | Status: DC

## 2019-11-22 MED ORDER — HYDRALAZINE HCL 20 MG/ML IJ SOLN
INTRAMUSCULAR | Status: AC
  Filled 2019-11-22: qty 1

## 2019-11-22 MED ORDER — LISINOPRIL 20 MG PO TABS
20.0000 mg | ORAL_TABLET | Freq: Every day | ORAL | Status: DC
  Administered 2019-11-22 – 2019-11-23 (×2): 20 mg via ORAL
  Filled 2019-11-22 (×2): qty 1

## 2019-11-22 MED ORDER — LIDOCAINE HCL (PF) 1 % IJ SOLN
INTRAMUSCULAR | Status: DC | PRN
  Administered 2019-11-22: 12 mL

## 2019-11-22 MED ORDER — METOPROLOL TARTRATE 5 MG/5ML IV SOLN
5.0000 mg | INTRAVENOUS | Status: AC | PRN
  Administered 2019-11-22 (×2): 5 mg via INTRAVENOUS
  Filled 2019-11-22 (×2): qty 5

## 2019-11-22 MED ORDER — AMLODIPINE BESYLATE 5 MG PO TABS
5.0000 mg | ORAL_TABLET | Freq: Every day | ORAL | Status: AC
  Administered 2019-11-22 – 2019-11-23 (×2): 5 mg via ORAL
  Filled 2019-11-22 (×3): qty 1

## 2019-11-22 SURGICAL SUPPLY — 2 items
MONITOR REVEAL LINQ II (Prosthesis & Implant Heart) ×1 IMPLANT
PACK LOOP INSERTION (CUSTOM PROCEDURE TRAY) ×2 IMPLANT

## 2019-11-22 NOTE — Progress Notes (Signed)
  Echocardiogram Echocardiogram Transesophageal has been performed.  Bobbye Charleston 11/22/2019, 11:34 AM

## 2019-11-22 NOTE — Discharge Summary (Signed)
Physician Discharge Summary  ORMAL ANASTAS [id number] DOB: January 22, 1966 DOA: 11/20/2019  PCP: Jearld Fenton, NP  Admit date: 11/20/2019 Discharge date: 11/23/2019  Admitted From: Home Disposition: Home  Recommendations for Outpatient Follow-up:  1. Follow up with PCP in 1 week 2. Follow up with neurology in 4 weeks 3. Please obtain BMP/CBC in one week 4. Please follow up on the following pending results: ANA, prothrombin gene mutation.  Home Health: PT Equipment/Devices: Cane  Discharge Condition: Stable CODE STATUS: Full code Diet recommendation: Heart healthy   Brief/Interim Summary:  Admission HPI written by Lavina Hamman, MD   Chief Complaint: Fall  HPI: Darren Pearson is a 53 y.o. male with Past medical history of hypertension, hep C. Patient presented with a fall. Patient reports that he has been having balance issues for last 10 days and has fallen multiple times.  Sometimes his legs give up as well. At the time of my evaluation denies any having any headache nausea vomiting chest pain abdominal pain no dizziness no lightheadedness.  No focal deficit reported by the patient. Patient reports he is smoking cigarettes half pack a day but does not drink alcohol.  Does not abuse medicines or drugs. Does not take any medications at his baseline.  ED Course: Presents with above complaint. Work-up showed that the patient actually has subacute stroke.  Neurology was consulted and recommended patient to be transferred to [facility]  Patient is outside of the [facility] intervention.   Hospital course:  Acute CVA MRI significant for acute/subacute bilateral cortical infarction of left postcentral gyrus/corona radiata in addition to punctate cortical infarct of the right parietal lobe; also noted to have remote infarcts and evidence of likely chronic microvascular ischemia. Transesophageal Echocardiogram significant for no embolic source. LDL of 74. Hemoglobin  A1C of 4.2%. Neurology on board and recommended hypercoagulability workup; Lupus anticoagulant panel, b-2-glycoprotein IgG/M/A, cardiolipin IgG/M/A. Started on aspirin and Plavix with recommendations for indefinite aspirin use and 3 weeks of Plavix. Patient underwent Transesophageal Echocardiogram which was significant for no embolic source. Loop recorder placed prior to discharge.  Essential hypertension Hypertensive urgency Patient is on no antihypertensives as an outpatient. Started on lisinopril  prior to discharge. Will need PCP follow-up.  Hypokalemia Given supplementation.  AKI Creatinine of 1.27 on admission. Resolved.  Elevated ammonia Does not appear to have any evidence of encephalopathy. Normal AST/ALT/Alkaline phosphatase. Will need outpatient follow-up.  Hyperlipidemia Lipitor 40 mg daily on discharge  Vitamin B12 deficiency.  B12 level was 170.  His symptoms were not typical for a subacute combined degeneration.  No proprioception/vibration sensation loss. Continue vitamin B12 vitamins and follow-up with PCP.  ICA aneurysm Small and noted on CTA neck. Will need good blood pressure control.  Systolic heart dysfunction Seen on Transthoracic Echocardiogram. No evidence of heart failure symptoms.  Tobacco use  Counseled on admission and on 12/28. Patient motivated. Recommend outpatient nicotine patch and close PCP follow-up.  Discharge Diagnoses:  Active Problems:   Acute CVA (cerebrovascular accident) ([place])   Hypokalemia   Accelerated hypertension   Hyperammonemia (HCC)   Elevated serum creatinine  Discharge Instructions  Discharge Instructions    Ambulatory referral to Neurology   Complete by: As directed    Follow up with stroke clinic NP (Jessica Vanschaick or Cecille Rubin, if both not available, consider Zachery Dauer, or [place]) at [facility] in about 4 weeks. Thanks.   Diet - low sodium heart healthy   Complete by: As directed  Discharge instructions    Complete by: As directed    It was pleasure taking care of you. You will take Plavix and aspirin together for 3 weeks and then continue with aspirin. We are arranging some home health services which include physical therapy and Occupational Therapy. Your vitamin B12 level was low so we recommend taking supplement. Please follow-up with your primary care physician and neurologist.   Increase activity slowly   Complete by: As directed      Allergies as of 11/23/2019      Reactions   Sulfa Antibiotics Hives      Medication List    STOP taking these medications   azithromycin 250 MG tablet Commonly known as: ZITHROMAX   doxycycline 100 MG capsule Commonly known as: VIBRAMYCIN   levofloxacin 500 MG tablet Commonly known as: LEVAQUIN   ondansetron 4 MG disintegrating tablet Commonly known as: Zofran ODT   oxyCODONE-acetaminophen 5-325 MG tablet Commonly known as: Percocet   predniSONE 10 MG tablet Commonly known as: DELTASONE     TAKE these medications   acetaminophen 325 MG tablet Commonly known as: TYLENOL Take 650 mg by mouth every 6 (six) hours as needed for mild pain or headache.   aspirin 81 MG EC tablet Take 1 tablet (81 mg total) by mouth daily. Start taking on: November 24, 2019   atorvastatin 40 MG tablet Commonly known as: LIPITOR Take 1 tablet (40 mg total) by mouth daily at 6 PM.   clopidogrel 75 MG tablet Commonly known as: PLAVIX Take 1 tablet (75 mg total) by mouth daily for 21 days. Start taking on: November 24, 2019   cyanocobalamin 1000 MCG tablet Take 1 tablet (1,000 mcg total) by mouth daily. Start taking on: November 24, 2019   lisinopril 20 MG tablet Commonly known as: ZESTRIL Take 1 tablet (20 mg total) by mouth daily.   polyvinyl alcohol 1.4 % ophthalmic solution Commonly known as: LIQUIFILM TEARS Place 1 drop into both eyes as needed for dry eyes.            Durable Medical Equipment  (From admission, onward)          Start     Ordered   11/23/19 1049  DME Cane  Once     11/23/19 1049   11/23/19 1048  For home use only DME 3 n 1  Once     11/23/19 1047   11/22/19 1043  For home use only DME Cane  Once     11/22/19 1042         Follow-up Information    [facility] Follow up on 11/30/2019.   Specialty: Family Medicine Why: Your appointment is at 10:50 am. Please arrive early and bring: picture ID, current medications. Contact information: [place] [phone] [place] [facility] Follow up.   Why: Please use this location for your pharmacy needs. they assist with the cost of your meds Contact information: [place] [phone] [place] Follow up on 12/06/2019.   Why: at 1030 for post hospital follow up for loop implant Contact information: [address] [place] [phone] [facility]. Schedule an appointment as soon as possible for a visit in 4 week(s).   Specialty: Neurology Contact information: [phone]  [place] [address] [phone]         Allergies  Allergen Reactions  . Sulfa Antibiotics Hives    Consultations:  Neurology  Cardiology  Procedures/Studies: CT ANGIO HEAD W OR WO CONTRAST  Result Date: 11/21/2019 CLINICAL DATA:  Stroke, follow-up EXAM: CT ANGIOGRAPHY HEAD AND NECK TECHNIQUE: Multidetector CT imaging of the head and neck was performed using the standard protocol during bolus administration of intravenous contrast. Multiplanar CT image reconstructions and MIPs were obtained to evaluate the vascular anatomy. Carotid stenosis measurements (when applicable) are obtained utilizing NASCET criteria, using the distal internal carotid diameter as the denominator.  CONTRAST:  97mL OMNIPAQUE IOHEXOL 350 MG/ML SOLN COMPARISON:  None. FINDINGS: CTA NECK FINDINGS Aortic arch: Great vessel origins are patent. Right carotid system: Common, internal, and external carotid arteries are patent. There is minor plaque without measurable stenosis at the ICA origin. Left carotid system: Common, internal, and external carotid arteries are patent. There is minor plaque without measurable stenosis at the ICA origin. Vertebral arteries: Patent. Right vertebral artery is dominant. There is focal moderate stenosis of the proximal right vertebral artery just beyond the origin. Skeleton: Degenerative changes are present, greatest at C6-C7. Other neck: No neck mass or adenopathy. Upper chest: Bullous changes at the lung apices. Paraseptal and centrilobular emphysema. Review of the MIP images confirms the above findings CTA HEAD FINDINGS Anterior circulation: Intracranial internal carotid arteries are patent with mild calcified plaque. There is a 2 x 1 mm laterally directed outpouching from the paraclinoid left ICA. Anterior and middle cerebral arteries are patent. Posterior circulation: Intracranial right vertebral artery is patent with mild calcified and noncalcified plaque. Left vertebral artery terminates as a PICA. Basilar artery is patent. Posterior cerebral arteries are patent. Right larger than left posterior communicating arteries are present. Venous sinuses: As permitted by contrast timing, patent. Anatomic variants: Fetal origin of the right posterior cerebral artery. Review of the MIP images confirms the above findings IMPRESSION: No large vessel occlusion or hemodynamically significant stenosis. Small aneurysm of the paraclinoid left ICA. Electronically Signed   By: Macy Mis M.D.   On: 11/21/2019 10:06   DG Chest 2 View  Result Date: 11/20/2019 CLINICAL DATA:  Status post fall on left ribs. Shortness of breath. EXAM: CHEST - 2 VIEW COMPARISON:  August 12, 2016 FINDINGS: The  heart size and mediastinal contours are within normal limits. Both lungs are clear. The lungs are hyperinflated. The visualized skeletal structures are unremarkable. The visualized left ribs demonstrate no acute fracture or dislocation. IMPRESSION: 1. No active cardiopulmonary disease.  COPD. 2. No acute fracture or dislocation of left ribs. Electronically Signed   By: Abelardo Diesel M.D.   On: 11/20/2019 10:03   CT Head Wo Contrast  Result Date: 11/20/2019 CLINICAL DATA:  Off balance for several weeks. Multiple falls. EXAM: CT HEAD WITHOUT CONTRAST TECHNIQUE: Contiguous axial images were obtained from the base of the skull through the vertex without intravenous contrast. COMPARISON:  None. FINDINGS: Brain: No evidence of acute infarction, hemorrhage, hydrocephalus, extra-axial collection or mass lesion/mass effect. There is chronic diffuse atrophy. Chronic bilateral periventricular white matter small vessel ischemic changes are noted. Small old lacunar infarction is identified in the right basal ganglia. Vascular: No hyperdense vessel is noted. Skull: Normal. Negative for fracture or focal lesion. Sinuses/Orbits: No acute finding. Other: None. IMPRESSION: 1. No focal acute intracranial abnormality identified. 2. Chronic diffuse atrophy. Chronic bilateral periventricular white matter small vessel ischemic change. Electronically Signed  By: Abelardo Diesel M.D.   On: 11/20/2019 10:20   CT ANGIO NECK W OR WO CONTRAST  Result Date: 11/21/2019 CLINICAL DATA:  Stroke, follow-up EXAM: CT ANGIOGRAPHY HEAD AND NECK TECHNIQUE: Multidetector CT imaging of the head and neck was performed using the standard protocol during bolus administration of intravenous contrast. Multiplanar CT image reconstructions and MIPs were obtained to evaluate the vascular anatomy. Carotid stenosis measurements (when applicable) are obtained utilizing NASCET criteria, using the distal internal carotid diameter as the denominator. CONTRAST:   67mL OMNIPAQUE IOHEXOL 350 MG/ML SOLN COMPARISON:  None. FINDINGS: CTA NECK FINDINGS Aortic arch: Great vessel origins are patent. Right carotid system: Common, internal, and external carotid arteries are patent. There is minor plaque without measurable stenosis at the ICA origin. Left carotid system: Common, internal, and external carotid arteries are patent. There is minor plaque without measurable stenosis at the ICA origin. Vertebral arteries: Patent. Right vertebral artery is dominant. There is focal moderate stenosis of the proximal right vertebral artery just beyond the origin. Skeleton: Degenerative changes are present, greatest at C6-C7. Other neck: No neck mass or adenopathy. Upper chest: Bullous changes at the lung apices. Paraseptal and centrilobular emphysema. Review of the MIP images confirms the above findings CTA HEAD FINDINGS Anterior circulation: Intracranial internal carotid arteries are patent with mild calcified plaque. There is a 2 x 1 mm laterally directed outpouching from the paraclinoid left ICA. Anterior and middle cerebral arteries are patent. Posterior circulation: Intracranial right vertebral artery is patent with mild calcified and noncalcified plaque. Left vertebral artery terminates as a PICA. Basilar artery is patent. Posterior cerebral arteries are patent. Right larger than left posterior communicating arteries are present. Venous sinuses: As permitted by contrast timing, patent. Anatomic variants: Fetal origin of the right posterior cerebral artery. Review of the MIP images confirms the above findings IMPRESSION: No large vessel occlusion or hemodynamically significant stenosis. Small aneurysm of the paraclinoid left ICA. Electronically Signed   By: Macy Mis M.D.   On: 11/21/2019 10:06   MR BRAIN WO CONTRAST  Result Date: 11/20/2019 CLINICAL DATA:  Off balance for several weeks. Multiple falls. Stroke, follow-up. EXAM: MRI HEAD WITHOUT CONTRAST TECHNIQUE: Multiplanar,  multiecho pulse sequences of the brain and surrounding structures were obtained without intravenous contrast. COMPARISON:  CT head without contrast 11/20/2019 FINDINGS: Brain: The diffusion-weighted images demonstrate bilateral acute cortical infarction. An area of focal restricted diffusion is present in the right left postcentral gyrus and corona radiata. A punctate cortical focus of restricted diffusion is present in the right parietal lobe on image 34 of series 4. Age advanced atrophy and white matter disease is present. A remote hemorrhagic lacunar infarct is present in the right caudate head. Remote nonhemorrhagic infarcts present in the thalami bilaterally. Diffuse confluent white matter disease is present bilaterally with multiple remote lacunar infarcts in the corona radiata bilaterally. The ventricles are proportionate to the degree of atrophy. Multiple remote nonhemorrhagic lacunar infarcts are present within the pons. Remote lacunar infarcts are present in the right cerebellum. Vascular: Flow is present in the major intracranial arteries. Skull and upper cervical spine: The craniocervical junction is normal. Upper cervical spine is within normal limits. Marrow signal is unremarkable. Sinuses/Orbits: There is some fluid in the right mastoid air cells. No obstructing nasopharyngeal lesion is present. Mild mucosal thickening is present within anterior ethmoid air cells bilaterally as well as the inferior left frontal sinus. No fluid levels are present. The paranasal sinuses and mastoid air cells are otherwise clear.  The globes and orbits are within normal limits. IMPRESSION: 1. Acute/subacute bilateral cortical infarction involving the left postcentral gyrus and corona radiata. 2. Additional punctate cortical infarct in the right parietal lobe. 3. Age advanced atrophy and white matter disease likely reflects the sequela of chronic microvascular ischemia. 4. Remote hemorrhagic lacunar infarcts of the right  caudate head and right cerebellum. 5. Mild sinus disease as described. Electronically Signed   By: San Morelle M.D.   On: 11/20/2019 15:46   EP PPM/ICD IMPLANT  Result Date: 11/22/2019 SURGEON:  Thompson Grayer, MD   PREPROCEDURE DIAGNOSIS:  Cryptogenic Stroke   POSTPROCEDURE DIAGNOSIS:  Cryptogenic Stroke    PROCEDURES:  1. Implantable loop recorder implantation   INTRODUCTION:  Darren Pearson is a 53 y.o. male with a history of unexplained stroke who presents today for implantable loop implantation.  The patient has had a cryptogenic stroke.  Despite an extensive workup by neurology, no reversible causes have been identified.  he has worn telemetry during which he did not have arrhythmias.  There is significant concern for possible atrial fibrillation as the cause for the patients stroke.  The patient therefore presents today for implantable loop implantation.   DESCRIPTION OF PROCEDURE:  Informed written consent was obtained.  The patient required no sedation for the procedure today.  The patients left chest was prepped and draped. Mapping over the patient's chest was performed to identify the appropriate ILR site.  This area was found to be the left parasternal region over the 3rd-4th intercostal space.  The skin overlying this region was infiltrated with lidocaine for local analgesia.  A 0.5-cm incision was made at the implant site.  A subcutaneous ILR pocket was fashioned using a combination of sharp and blunt dissection.  A Medtronic Reveal Linq model [id number] implantable loop recorder was then placed into the pocket R waves were very prominent and measured > 0.2 mV. EBL<1 ml.  Steri- Strips and a sterile dressing were then applied.  There were no early apparent complications.   CONCLUSIONS:  1. Successful implantation of a Medtronic Reveal LINQ implantable loop recorder for cryptogenic stroke  2. No early apparent complications. Thompson Grayer MD, [facility] 11/22/2019 3:06 PM   ECHOCARDIOGRAM  COMPLETE  Result Date: 11/21/2019   ECHOCARDIOGRAM REPORT   Patient Name:   Darren Pearson Date of Exam: 11/21/2019 Medical Rec #:  [id number]       Height:       68.0 in Accession #:    [id number]      Weight:       160.0 lb Date of Birth:  04-Jul-1966        BSA:          1.86 m Patient Age:    74 years        BP:           173/109 mmHg Patient Gender: M               HR:           79 bpm. Exam Location:  Inpatient Procedure: 2D Echo, Cardiac Doppler and Color Doppler Indications:    Stroke  History:        Patient has no prior history of Echocardiogram examinations.                 Risk Factors:Hypertension.  Sonographer:    Roseanna Rainbow RDCS Referring Phys: [id number] [place] Comments: Image acquisition challenging due to respiratory motion.  IMPRESSIONS  1. Left ventricular ejection fraction, by visual estimation, is 45 to 50%. The left ventricle has low normal function. There is moderately increased left ventricular hypertrophy.  2. Left ventricular diastolic parameters are consistent with Grade I diastolic dysfunction (impaired relaxation).  3. The left ventricle demonstrates global hypokinesis.  4. Global right ventricle has mildly reduced systolic function.The right ventricular size is normal. No increase in right ventricular wall thickness.  5. Left atrial size was normal.  6. Right atrial size was normal.  7. The mitral valve is grossly normal. Trivial mitral valve regurgitation.  8. The tricuspid valve is normal in structure.  9. The aortic valve is tricuspid. Aortic valve regurgitation is not visualized. Mild aortic valve sclerosis without stenosis. 10. The pulmonic valve was grossly normal. Pulmonic valve regurgitation is not visualized. 11. The inferior vena cava is normal in size with greater than 50% respiratory variability, suggesting right atrial pressure of 3 mmHg. 12. The interatrial septum was not well visualized. FINDINGS  Left Ventricle: Left ventricular ejection fraction, by  visual estimation, is 45 to 50%. The left ventricle has low normal function. The left ventricle demonstrates global hypokinesis. There is moderately increased left ventricular hypertrophy. Left ventricular diastolic parameters are consistent with Grade I diastolic dysfunction (impaired relaxation). Indeterminate filling pressures. Right Ventricle: The right ventricular size is normal. No increase in right ventricular wall thickness. Global RV systolic function is has mildly reduced systolic function. Left Atrium: Left atrial size was normal in size. Right Atrium: Right atrial size was normal in size Pericardium: There is no evidence of pericardial effusion. Mitral Valve: The mitral valve is grossly normal. Trivial mitral valve regurgitation. Tricuspid Valve: The tricuspid valve is normal in structure. Tricuspid valve regurgitation is not demonstrated. Aortic Valve: The aortic valve is tricuspid. Aortic valve regurgitation is not visualized. Mild aortic valve sclerosis is present, with no evidence of aortic valve stenosis. Pulmonic Valve: The pulmonic valve was grossly normal. Pulmonic valve regurgitation is not visualized. Pulmonic regurgitation is not visualized. Aorta: The aortic root and ascending aorta are structurally normal, with no evidence of dilitation. Venous: The inferior vena cava is normal in size with greater than 50% respiratory variability, suggesting right atrial pressure of 3 mmHg. IAS/Shunts: The interatrial septum was not well visualized.  LEFT VENTRICLE PLAX 2D LVIDd:         4.30 cm       Diastology LVIDs:         3.20 cm       LV e' lateral:   9.03 cm/s LV PW:         1.50 cm       LV E/e' lateral: 8.3 LV IVS:        1.30 cm       LV e' medial:    6.31 cm/s LVOT diam:     2.10 cm       LV E/e' medial:  11.9 LV SV:         42 ml LV SV Index:   22.48 LVOT Area:     3.46 cm  LV Volumes (MOD) LV area d, A2C:    30.70 cm LV area d, A4C:    28.70 cm LV area s, A2C:    20.70 cm LV area s, A4C:     19.30 cm LV major d, A2C:   8.27 cm LV major d, A4C:   8.49 cm LV major s, A2C:   7.52 cm LV major s, A4C:  7.19 cm LV vol d, MOD A2C: 95.5 ml LV vol d, MOD A4C: 80.0 ml LV vol s, MOD A2C: 48.8 ml LV vol s, MOD A4C: 43.0 ml LV SV MOD A2C:     46.7 ml LV SV MOD A4C:     80.0 ml LV SV MOD BP:      41.5 ml RIGHT VENTRICLE            IVC RV S prime:     9.44 cm/s  IVC diam: 1.40 cm TAPSE (M-mode): 1.9 cm LEFT ATRIUM             Index       RIGHT ATRIUM           Index LA diam:        2.00 cm 1.08 cm/m  RA Area:     10.90 cm LA Vol (A2C):   33.1 ml 17.81 ml/m RA Volume:   22.70 ml  12.21 ml/m LA Vol (A4C):   27.7 ml 14.90 ml/m LA Biplane Vol: 32.9 ml 17.70 ml/m  AORTIC VALVE LVOT Vmax:   83.60 cm/s LVOT Vmean:  56.400 cm/s LVOT VTI:    0.130 m  AORTA Ao Root diam: 3.50 cm Ao Asc diam:  3.20 cm MITRAL VALVE MV Area (PHT): 3.31 cm             SHUNTS MV PHT:        66.41 msec           Systemic VTI:  0.13 m MV Decel Time: 229 msec             Systemic Diam: 2.10 cm MV E velocity: 75.00 cm/s 103 cm/s MV A velocity: 80.60 cm/s 70.3 cm/s MV E/A ratio:  0.93       1.5  Lyman Bishop MD Electronically signed by Lyman Bishop MD Signature Date/Time: 11/21/2019/3:20:41 PM    Final    ECHO TEE  Result Date: 11/22/2019   TRANSESOPHOGEAL ECHO REPORT   Patient Name:   Darren Pearson Date of Exam: 11/22/2019 Medical Rec #:  [id number]       Height:       68.0 in Accession #:    [id number]      Weight:       160.0 lb Date of Birth:  1966/07/31        BSA:          1.86 m Patient Age:    1 years        BP:           230/119 mmHg Patient Gender: M               HR:           72 bpm. Exam Location:  Inpatient  Procedure: Transesophageal Echo, Cardiac Doppler and Saline Contrast Bubble            Study Indications:     Stroke  History:         Patient has prior history of Echocardiogram examinations, most                  recent 11/21/2019.  Sonographer:     Roseanna Rainbow RDCS Referring Phys:  [id number] Abigail Butts Diagnosing  Phys: [name] MD  PROCEDURE: Normal Transesophogeal exam. Consent was requested emergently by emergency room physicain. Patients was under conscious sedation during this procedure. Anesthetic was administered intravenously by performing Physician: 71mcg of Fentanyl, 6mg  of  Versed. The transesophogeal probe was passed through the esophogus of the patient. Imaged were obtained with the patient in a left lateral decubitus position. The patient's vital signs; including heart rate, blood pressure, and oxygen saturation; remained stable throughout the procedure. The patient developed no complications during the procedure. IMPRESSIONS  1. Left ventricular ejection fraction, by visual estimation, is 45 to 50%. The left ventricle has mildly decreased function. There is moderately increased left ventricular hypertrophy.  2. The left ventricle has no regional wall motion abnormalities.  3. Global right ventricle has normal systolic function.The right ventricular size is normal. No increase in right ventricular wall thickness.  4. Left atrial size was normal.  5. Right atrial size was normal.  6. The mitral valve is normal in structure. Mild mitral valve regurgitation. No evidence of mitral stenosis.  7. The tricuspid valve is normal in structure.  8. The aortic valve is normal in structure. Aortic valve regurgitation is not visualized. No evidence of aortic valve sclerosis or stenosis.  9. The pulmonic valve was normal in structure. Pulmonic valve regurgitation is not visualized. 10. The inferior vena cava is normal in size with greater than 50% respiratory variability, suggesting right atrial pressure of 3 mmHg. 11. No intracardiac source of embolism. Negative bubble study. FINDINGS  Left Ventricle: Left ventricular ejection fraction, by visual estimation, is 45 to 50%. The left ventricle has mildly decreased function. The left ventricle has no regional wall motion abnormalities. There is moderately increased left  ventricular hypertrophy. Concentric left ventricular hypertrophy. Normal left atrial pressure. Right Ventricle: The right ventricular size is normal. No increase in right ventricular wall thickness. Global RV systolic function is has normal systolic function. Left Atrium: Left atrial size was normal in size. Right Atrium: Right atrial size was normal in size Pericardium: There is no evidence of pericardial effusion. Mitral Valve: The mitral valve is normal in structure. No evidence of mitral valve stenosis by observation. Mild mitral valve regurgitation. Tricuspid Valve: The tricuspid valve is normal in structure. Tricuspid valve regurgitation is mild. Aortic Valve: The aortic valve is normal in structure. Aortic valve regurgitation is not visualized. The aortic valve is structurally normal, with no evidence of sclerosis or stenosis. Pulmonic Valve: The pulmonic valve was normal in structure. Pulmonic valve regurgitation is not visualized. Aorta: The aortic root, ascending aorta and aortic arch are all structurally normal, with no evidence of dilitation or obstruction. Venous: The inferior vena cava is normal in size with greater than 50% respiratory variability, suggesting right atrial pressure of 3 mmHg. Shunts: Agitated saline contrast was given intravenously to evaluate for intracardiac shunting. Saline contrast bubble study was negative, with no evidence of any interatrial shunt. There is no evidence of a patent foramen ovale. No ventricular septal defect is seen or detected. There is no evidence of an atrial septal defect. No atrial level shunt detected by color flow Doppler.  [name] MD Electronically signed by [name] MD Signature Date/Time: 11/22/2019/12:50:07 PM    Final    VAS [place] LOWER EXTREMITY VENOUS (DVT)  Result Date: 11/22/2019  Lower Venous Study Indications: Stroke.  Comparison Study: No prior study on file Performing Technologist: Sharion Dove RVS  Examination Guidelines: A  complete evaluation includes B-mode imaging, spectral Doppler, color Doppler, and power Doppler as needed of all accessible portions of each vessel. Bilateral testing is considered an integral part of a complete examination. Limited examinations for reoccurring indications may be performed as noted.  +---------+---------------+---------+-----------+----------+--------------+ RIGHT    CompressibilityPhasicitySpontaneityPropertiesThrombus  Aging +---------+---------------+---------+-----------+----------+--------------+ CFV      Full           Yes      Yes                                 +---------+---------------+---------+-----------+----------+--------------+ SFJ      Full                                                        +---------+---------------+---------+-----------+----------+--------------+ FV Prox  Full                                                        +---------+---------------+---------+-----------+----------+--------------+ FV Mid   Full                                                        +---------+---------------+---------+-----------+----------+--------------+ FV DistalFull                                                        +---------+---------------+---------+-----------+----------+--------------+ PFV      Full                                                        +---------+---------------+---------+-----------+----------+--------------+ POP      Full           Yes      Yes                                 +---------+---------------+---------+-----------+----------+--------------+ PTV      Partial                                                     +---------+---------------+---------+-----------+----------+--------------+ PERO     Full                                                        +---------+---------------+---------+-----------+----------+--------------+    +---------+---------------+---------+-----------+----------+--------------+ LEFT     CompressibilityPhasicitySpontaneityPropertiesThrombus Aging +---------+---------------+---------+-----------+----------+--------------+ CFV      Full           Yes      Yes                                 +---------+---------------+---------+-----------+----------+--------------+  SFJ      Full                                                        +---------+---------------+---------+-----------+----------+--------------+ FV Prox  Full                                                        +---------+---------------+---------+-----------+----------+--------------+ FV Mid   Full                                                        +---------+---------------+---------+-----------+----------+--------------+ FV DistalFull                                                        +---------+---------------+---------+-----------+----------+--------------+ PFV      Full                                                        +---------+---------------+---------+-----------+----------+--------------+ POP      Full           Yes      Yes                                 +---------+---------------+---------+-----------+----------+--------------+ PTV      Full                                                        +---------+---------------+---------+-----------+----------+--------------+ PERO     Full                                                        +---------+---------------+---------+-----------+----------+--------------+     Summary: Right: There is no evidence of deep vein thrombosis in the lower extremity. Left: There is no evidence of deep vein thrombosis in the lower extremity.  *See table(s) above for measurements and observations. Electronically signed by Ruta Hinds MD on 11/22/2019 at 1:46:38 PM.    Final     12/28: Transthoracic  Echocardiogram IMPRESSIONS    1. Left ventricular ejection fraction, by visual estimation, is 45 to 50%. The left ventricle has low normal function. There is moderately increased left ventricular hypertrophy.  2. Left ventricular diastolic parameters are consistent with Grade I diastolic dysfunction (impaired relaxation).  3. The left ventricle demonstrates  global hypokinesis.  4. Global right ventricle has mildly reduced systolic function.The right ventricular size is normal. No increase in right ventricular wall thickness.  5. Left atrial size was normal.  6. Right atrial size was normal.  7. The mitral valve is grossly normal. Trivial mitral valve regurgitation.  8. The tricuspid valve is normal in structure.  9. The aortic valve is tricuspid. Aortic valve regurgitation is not visualized. Mild aortic valve sclerosis without stenosis. 10. The pulmonic valve was grossly normal. Pulmonic valve regurgitation is not visualized. 11. The inferior vena cava is normal in size with greater than 50% respiratory variability, suggesting right atrial pressure of 3 mmHg. 12. The interatrial septum was not well visualized.   12/29: Transthoracic Echocardiogram IMPRESSIONS    1. Left ventricular ejection fraction, by visual estimation, is 45 to 50%. The left ventricle has mildly decreased function. There is moderately increased left ventricular hypertrophy.  2. The left ventricle has no regional wall motion abnormalities.  3. Global right ventricle has normal systolic function.The right ventricular size is normal. No increase in right ventricular wall thickness.  4. Left atrial size was normal.  5. Right atrial size was normal.  6. The mitral valve is normal in structure. Mild mitral valve regurgitation. No evidence of mitral stenosis.  7. The tricuspid valve is normal in structure.  8. The aortic valve is normal in structure. Aortic valve regurgitation is not visualized. No evidence of aortic  valve sclerosis or stenosis.  9. The pulmonic valve was normal in structure. Pulmonic valve regurgitation is not visualized. 10. The inferior vena cava is normal in size with greater than 50% respiratory variability, suggesting right atrial pressure of 3 mmHg. 11. No intracardiac source of embolism. Negative bubble study.   Subjective: Patient was feeling better when seen this morning.  No new complaints..  Discharge Exam: Vitals:   11/23/19 0356 11/23/19 0834  BP: (!) 150/95 (!) 151/93  Pulse: 71 85  Resp: 16 16  Temp: 98.7 F (37.1 C) 98.8 F (37.1 C)  SpO2: 100% 99%   Vitals:   11/22/19 1929 11/22/19 2316 11/23/19 0356 11/23/19 0834  BP: (!) 152/86 140/83 (!) 150/95 (!) 151/93  Pulse: 93 87 71 85  Resp: 16 18 16 16   Temp: 98.8 F (37.1 C) 98.7 F (37.1 C) 98.7 F (37.1 C) 98.8 F (37.1 C)  TempSrc: Oral Oral Oral Oral  SpO2: 95% 99% 100% 99%  Weight:      Height:        General: Pt is alert, awake, not in acute distress Cardiovascular: RRR, S1/S2 +, no rubs, no gallops Respiratory: CTA bilaterally, no wheezing, no rhonchi Abdominal: Soft, NT, ND, bowel sounds + Musculoskeletal: no edema, no cyanosis. Reproducible tenderness over left lower ribs.  The results of significant diagnostics from this hospitalization (including imaging, microbiology, ancillary and laboratory) are listed below for reference.     Microbiology: Recent Results (from the past 240 hour(s))  Urine culture     Status: None   Collection Time: 11/20/19 10:06 AM   Specimen: Urine, Random  Result Value Ref Range Status   Specimen Description   Final    URINE, RANDOM Performed at [facility] [address] [place], [place] [address]    Special Requests   Final    NONE Performed at [facility], [place] [address] [place], [place] [address]    Culture   Final    NO GROWTH Performed at [facility],  [address] [place], [place] [address]     Report Status 11/21/2019 FINAL  Final  SARS CORONAVIRUS 2 (TAT 6-24 HRS) Nasopharyngeal Nasopharyngeal Swab     Status: None   Collection Time: 11/20/19  4:07 PM   Specimen: Nasopharyngeal Swab  Result Value Ref Range Status   SARS Coronavirus 2 NEGATIVE NEGATIVE Final    Comment: (NOTE) SARS-CoV-2 target nucleic acids are NOT DETECTED. The SARS-CoV-2 RNA is generally detectable in upper and lower respiratory specimens during the acute phase of infection. Negative results do not preclude SARS-CoV-2 infection, do not rule out co-infections with other pathogens, and should not be used as the sole basis for treatment or other patient management decisions. Negative results must be combined with clinical observations, patient history, and epidemiological information. The expected result is Negative. Fact Sheet for Patients: SugarRoll.be Fact Sheet for Healthcare Providers: https://www.woods-mathews.com/ This test is not yet approved or cleared by the [place] FDA and  has been authorized for detection and/or diagnosis of SARS-CoV-2 by FDA under an Emergency Use Authorization (EUA). This EUA will remain  in effect (meaning this test can be used) for the duration of the COVID-19 declaration under Section 56 4(b)(1) of the Act, 21 U.S.C. section 360bbb-3(b)(1), unless the authorization is terminated or revoked sooner. Performed at [facility], [place] [address], [place], [place] [address]      Labs: BNP (last 3 results) No results for input(s): BNP in the last 8760 hours. Basic Metabolic Panel: Recent Labs  Lab 11/20/19 1006 11/21/19 1317  NA 140 140  K 3.1* 3.7  CL 104 106  CO2 26 27  GLUCOSE 91 95  BUN 13 10  CREATININE 1.27* 1.19  CALCIUM 8.8* 8.7*   Liver Function Tests: Recent Labs  Lab 11/20/19 1006  AST 24  ALT 13  ALKPHOS 89  BILITOT 1.0  PROT 8.0  ALBUMIN 3.7   No results for input(s): LIPASE, AMYLASE in the  last 168 hours. Recent Labs  Lab 11/20/19 1006  AMMONIA 55*   CBC: Recent Labs  Lab 11/20/19 1006  WBC 6.4  NEUTROABS 4.4  HGB 12.2*  HCT 38.0*  MCV 95.5  PLT 232   Cardiac Enzymes: No results for input(s): CKTOTAL, CKMB, CKMBINDEX, TROPONINI in the last 168 hours. BNP: Invalid input(s): POCBNP CBG: No results for input(s): GLUCAP in the last 168 hours. D-Dimer No results for input(s): DDIMER in the last 72 hours. Hgb A1c Recent Labs    11/22/19 0303  HGBA1C 4.2*   Lipid Profile Recent Labs    11/22/19 0303  CHOL 139  HDL 54  LDLCALC 74  TRIG 53  CHOLHDL 2.6   Thyroid function studies Recent Labs    11/21/19 0752  TSH 1.345   Anemia work up Recent Labs    11/21/19 0752  VITAMINB12 170*   Urinalysis    Component Value Date/Time   COLORURINE YELLOW 11/20/2019 [place] 11/20/2019 1006   LABSPEC 1.013 11/20/2019 1006   PHURINE 7.0 11/20/2019 1006   GLUCOSEU NEGATIVE 11/20/2019 1006   HGBUR NEGATIVE 11/20/2019 1006   [place] 11/20/2019 1006   BILIRUBINUR neg 05/30/2014 1538   KETONESUR NEGATIVE 11/20/2019 1006   PROTEINUR NEGATIVE 11/20/2019 1006   UROBILINOGEN >=8.0 05/30/2014 1538   NITRITE NEGATIVE 11/20/2019 1006   LEUKOCYTESUR NEGATIVE 11/20/2019 1006   Sepsis Labs Invalid input(s): PROCALCITONIN,  WBC,  LACTICIDVEN Microbiology Recent Results (from the past 240 hour(s))  Urine culture  Status: None   Collection Time: 11/20/19 10:06 AM   Specimen: Urine, Random  Result Value Ref Range Status   Specimen Description   Final    URINE, RANDOM Performed at [facility] [address] [place], [place] [address]    Special Requests   Final    NONE Performed at [facility], [place] [address], [name], [place] [address]    Culture   Final    NO GROWTH Performed at [facility], [place] [address], [place], [place] [address]    Report Status 11/21/2019 FINAL  Final   SARS CORONAVIRUS 2 (TAT 6-24 HRS) Nasopharyngeal Nasopharyngeal Swab     Status: None   Collection Time: 11/20/19  4:07 PM   Specimen: Nasopharyngeal Swab  Result Value Ref Range Status   SARS Coronavirus 2 NEGATIVE NEGATIVE Final    Comment: (NOTE) SARS-CoV-2 target nucleic acids are NOT DETECTED. The SARS-CoV-2 RNA is generally detectable in upper and lower respiratory specimens during the acute phase of infection. Negative results do not preclude SARS-CoV-2 infection, do not rule out co-infections with other pathogens, and should not be used as the sole basis for treatment or other patient management decisions. Negative results must be combined with clinical observations, patient history, and epidemiological information. The expected result is Negative. Fact Sheet for Patients: SugarRoll.be Fact Sheet for Healthcare Providers: https://www.woods-mathews.com/ This test is not yet approved or cleared by the [place] FDA and  has been authorized for detection and/or diagnosis of SARS-CoV-2 by FDA under an Emergency Use Authorization (EUA). This EUA will remain  in effect (meaning this test can be used) for the duration of the COVID-19 declaration under Section 56 4(b)(1) of the Act, 21 U.S.C. section 360bbb-3(b)(1), unless the authorization is terminated or revoked sooner. Performed at [facility], [place] [address], [place],  [address]     Time coordinating discharge: 35 minutes  SIGNED:  Lorella Nimrod MD [facility]. Pager [phone]  If 7PM-7AM, please contact night-coverage [email or web address] Password [facility]  11/23/2019, 10:56 AM  This record has been created using Systems analyst. Errors have been sought and corrected,but may not always be located. Such creation errors do not reflect on the standard of care.

## 2019-11-22 NOTE — Progress Notes (Signed)
STROKE TEAM PROGRESS NOTE   SUBJECTIVE (INTERVAL HISTORY) Pt RN and PT at bedside. Pt no acute event overnight. Had TEE negative for PFO or other abnormalities. Loop recorder placed.    OBJECTIVE Temp:  [98.1 F (36.7 C)-98.7 F (37.1 C)] 98.7 F (37.1 C) (12/29 1231) Pulse Rate:  [67-96] 96 (12/29 1231) Cardiac Rhythm: Normal sinus rhythm;Bundle branch block (12/29 0700) Resp:  [16-22] 18 (12/29 1231) BP: (152-230)/(82-135) 156/88 (12/29 1231) SpO2:  [97 %-100 %] 100 % (12/29 1231)  Recent Labs  Lab 11/20/19 1006 11/21/19 1317  NA 140 140  K 3.1* 3.7  CL 104 106  CO2 26 27  GLUCOSE 91 95  BUN 13 10  CREATININE 1.27* 1.19  CALCIUM 8.8* 8.7*   Recent Labs  Lab 11/20/19 1006  AST 24  ALT 13  ALKPHOS 89  BILITOT 1.0  PROT 8.0  ALBUMIN 3.7   Recent Labs  Lab 11/20/19 1006  WBC 6.4  NEUTROABS 4.4  HGB 12.2*  HCT 38.0*  MCV 95.5  PLT 232   Recent Labs    11/20/19 1006  COLORURINE YELLOW  LABSPEC 1.013  PHURINE 7.0  GLUCOSEU NEGATIVE  HGBUR NEGATIVE  BILIRUBINUR NEGATIVE  KETONESUR NEGATIVE  PROTEINUR NEGATIVE  NITRITE NEGATIVE  LEUKOCYTESUR NEGATIVE       Component Value Date/Time   CHOL 139 11/22/2019 0303   TRIG 53 11/22/2019 0303   HDL 54 11/22/2019 0303   CHOLHDL 2.6 11/22/2019 0303   VLDL 11 11/22/2019 0303   LDLCALC 74 11/22/2019 0303   Lab Results  Component Value Date   HGBA1C 4.2 (L) 11/22/2019      Component Value Date/Time   LABOPIA NONE DETECTED 11/21/2019 1100   COCAINSCRNUR NONE DETECTED 11/21/2019 1100   LABBENZ NONE DETECTED 11/21/2019 1100   AMPHETMU NONE DETECTED 11/21/2019 1100   THCU NONE DETECTED 11/21/2019 1100   LABBARB NONE DETECTED 11/21/2019 1100    No results for input(s): ETH in the last 168 hours.  I have personally reviewed the radiological images below and agree with the radiology interpretations.  CT ANGIO HEAD W OR WO CONTRAST  Result Date: 11/21/2019 CLINICAL DATA:  Stroke, follow-up EXAM: CT  ANGIOGRAPHY HEAD AND NECK TECHNIQUE: Multidetector CT imaging of the head and neck was performed using the standard protocol during bolus administration of intravenous contrast. Multiplanar CT image reconstructions and MIPs were obtained to evaluate the vascular anatomy. Carotid stenosis measurements (when applicable) are obtained utilizing NASCET criteria, using the distal internal carotid diameter as the denominator. CONTRAST:  3mL OMNIPAQUE IOHEXOL 350 MG/ML SOLN COMPARISON:  None. FINDINGS: CTA NECK FINDINGS Aortic arch: Great vessel origins are patent. Right carotid system: Common, internal, and external carotid arteries are patent. There is minor plaque without measurable stenosis at the ICA origin. Left carotid system: Common, internal, and external carotid arteries are patent. There is minor plaque without measurable stenosis at the ICA origin. Vertebral arteries: Patent. Right vertebral artery is dominant. There is focal moderate stenosis of the proximal right vertebral artery just beyond the origin. Skeleton: Degenerative changes are present, greatest at C6-C7. Other neck: No neck mass or adenopathy. Upper chest: Bullous changes at the lung apices. Paraseptal and centrilobular emphysema. Review of the MIP images confirms the above findings CTA HEAD FINDINGS Anterior circulation: Intracranial internal carotid arteries are patent with mild calcified plaque. There is a 2 x 1 mm laterally directed outpouching from the paraclinoid left ICA. Anterior and middle cerebral arteries are patent. Posterior circulation: Intracranial right vertebral artery  is patent with mild calcified and noncalcified plaque. Left vertebral artery terminates as a PICA. Basilar artery is patent. Posterior cerebral arteries are patent. Right larger than left posterior communicating arteries are present. Venous sinuses: As permitted by contrast timing, patent. Anatomic variants: Fetal origin of the right posterior cerebral artery. Review  of the MIP images confirms the above findings IMPRESSION: No large vessel occlusion or hemodynamically significant stenosis. Small aneurysm of the paraclinoid left ICA. Electronically Signed   By: Macy Mis M.D.   On: 11/21/2019 10:06   DG Chest 2 View  Result Date: 11/20/2019 CLINICAL DATA:  Status post fall on left ribs. Shortness of breath. EXAM: CHEST - 2 VIEW COMPARISON:  August 12, 2016 FINDINGS: The heart size and mediastinal contours are within normal limits. Both lungs are clear. The lungs are hyperinflated. The visualized skeletal structures are unremarkable. The visualized left ribs demonstrate no acute fracture or dislocation. IMPRESSION: 1. No active cardiopulmonary disease.  COPD. 2. No acute fracture or dislocation of left ribs. Electronically Signed   By: Abelardo Diesel M.D.   On: 11/20/2019 10:03   CT Head Wo Contrast  Result Date: 11/20/2019 CLINICAL DATA:  Off balance for several weeks. Multiple falls. EXAM: CT HEAD WITHOUT CONTRAST TECHNIQUE: Contiguous axial images were obtained from the base of the skull through the vertex without intravenous contrast. COMPARISON:  None. FINDINGS: Brain: No evidence of acute infarction, hemorrhage, hydrocephalus, extra-axial collection or mass lesion/mass effect. There is chronic diffuse atrophy. Chronic bilateral periventricular white matter small vessel ischemic changes are noted. Small old lacunar infarction is identified in the right basal ganglia. Vascular: No hyperdense vessel is noted. Skull: Normal. Negative for fracture or focal lesion. Sinuses/Orbits: No acute finding. Other: None. IMPRESSION: 1. No focal acute intracranial abnormality identified. 2. Chronic diffuse atrophy. Chronic bilateral periventricular white matter small vessel ischemic change. Electronically Signed   By: Abelardo Diesel M.D.   On: 11/20/2019 10:20   CT ANGIO NECK W OR WO CONTRAST  Result Date: 11/21/2019 CLINICAL DATA:  Stroke, follow-up EXAM: CT ANGIOGRAPHY  HEAD AND NECK TECHNIQUE: Multidetector CT imaging of the head and neck was performed using the standard protocol during bolus administration of intravenous contrast. Multiplanar CT image reconstructions and MIPs were obtained to evaluate the vascular anatomy. Carotid stenosis measurements (when applicable) are obtained utilizing NASCET criteria, using the distal internal carotid diameter as the denominator. CONTRAST:  29mL OMNIPAQUE IOHEXOL 350 MG/ML SOLN COMPARISON:  None. FINDINGS: CTA NECK FINDINGS Aortic arch: Great vessel origins are patent. Right carotid system: Common, internal, and external carotid arteries are patent. There is minor plaque without measurable stenosis at the ICA origin. Left carotid system: Common, internal, and external carotid arteries are patent. There is minor plaque without measurable stenosis at the ICA origin. Vertebral arteries: Patent. Right vertebral artery is dominant. There is focal moderate stenosis of the proximal right vertebral artery just beyond the origin. Skeleton: Degenerative changes are present, greatest at C6-C7. Other neck: No neck mass or adenopathy. Upper chest: Bullous changes at the lung apices. Paraseptal and centrilobular emphysema. Review of the MIP images confirms the above findings CTA HEAD FINDINGS Anterior circulation: Intracranial internal carotid arteries are patent with mild calcified plaque. There is a 2 x 1 mm laterally directed outpouching from the paraclinoid left ICA. Anterior and middle cerebral arteries are patent. Posterior circulation: Intracranial right vertebral artery is patent with mild calcified and noncalcified plaque. Left vertebral artery terminates as a PICA. Basilar artery is patent. Posterior cerebral arteries  are patent. Right larger than left posterior communicating arteries are present. Venous sinuses: As permitted by contrast timing, patent. Anatomic variants: Fetal origin of the right posterior cerebral artery. Review of the MIP  images confirms the above findings IMPRESSION: No large vessel occlusion or hemodynamically significant stenosis. Small aneurysm of the paraclinoid left ICA. Electronically Signed   By: Macy Mis M.D.   On: 11/21/2019 10:06   MR BRAIN WO CONTRAST  Result Date: 11/20/2019 CLINICAL DATA:  Off balance for several weeks. Multiple falls. Stroke, follow-up. EXAM: MRI HEAD WITHOUT CONTRAST TECHNIQUE: Multiplanar, multiecho pulse sequences of the brain and surrounding structures were obtained without intravenous contrast. COMPARISON:  CT head without contrast 11/20/2019 FINDINGS: Brain: The diffusion-weighted images demonstrate bilateral acute cortical infarction. An area of focal restricted diffusion is present in the right left postcentral gyrus and corona radiata. A punctate cortical focus of restricted diffusion is present in the right parietal lobe on image 34 of series 4. Age advanced atrophy and white matter disease is present. A remote hemorrhagic lacunar infarct is present in the right caudate head. Remote nonhemorrhagic infarcts present in the thalami bilaterally. Diffuse confluent white matter disease is present bilaterally with multiple remote lacunar infarcts in the corona radiata bilaterally. The ventricles are proportionate to the degree of atrophy. Multiple remote nonhemorrhagic lacunar infarcts are present within the pons. Remote lacunar infarcts are present in the right cerebellum. Vascular: Flow is present in the major intracranial arteries. Skull and upper cervical spine: The craniocervical junction is normal. Upper cervical spine is within normal limits. Marrow signal is unremarkable. Sinuses/Orbits: There is some fluid in the right mastoid air cells. No obstructing nasopharyngeal lesion is present. Mild mucosal thickening is present within anterior ethmoid air cells bilaterally as well as the inferior left frontal sinus. No fluid levels are present. The paranasal sinuses and mastoid air cells  are otherwise clear. The globes and orbits are within normal limits. IMPRESSION: 1. Acute/subacute bilateral cortical infarction involving the left postcentral gyrus and corona radiata. 2. Additional punctate cortical infarct in the right parietal lobe. 3. Age advanced atrophy and white matter disease likely reflects the sequela of chronic microvascular ischemia. 4. Remote hemorrhagic lacunar infarcts of the right caudate head and right cerebellum. 5. Mild sinus disease as described. Electronically Signed   By: San Morelle M.D.   On: 11/20/2019 15:46   EP PPM/ICD IMPLANT  Result Date: 11/22/2019 SURGEON:  Thompson Grayer, MD   PREPROCEDURE DIAGNOSIS:  Cryptogenic Stroke   POSTPROCEDURE DIAGNOSIS:  Cryptogenic Stroke    PROCEDURES:  1. Implantable loop recorder implantation   INTRODUCTION:  Darren Pearson is a 53 y.o. male with a history of unexplained stroke who presents today for implantable loop implantation.  The patient has had a cryptogenic stroke.  Despite an extensive workup by neurology, no reversible causes have been identified.  he has worn telemetry during which he did not have arrhythmias.  There is significant concern for possible atrial fibrillation as the cause for the patients stroke.  The patient therefore presents today for implantable loop implantation.   DESCRIPTION OF PROCEDURE:  Informed written consent was obtained.  The patient required no sedation for the procedure today.  The patients left chest was prepped and draped. Mapping over the patient's chest was performed to identify the appropriate ILR site.  This area was found to be the left parasternal region over the 3rd-4th intercostal space.  The skin overlying this region was infiltrated with lidocaine for local analgesia.  A  0.5-cm incision was made at the implant site.  A subcutaneous ILR pocket was fashioned using a combination of sharp and blunt dissection.  A Medtronic Reveal Linq model [id number] implantable loop recorder was  then placed into the pocket R waves were very prominent and measured > 0.2 mV. EBL<1 ml.  Steri- Strips and a sterile dressing were then applied.  There were no early apparent complications.   CONCLUSIONS:  1. Successful implantation of a Medtronic Reveal LINQ implantable loop recorder for cryptogenic stroke  2. No early apparent complications. Thompson Grayer MD, [facility] 11/22/2019 3:06 PM   ECHOCARDIOGRAM COMPLETE  Result Date: 11/21/2019   ECHOCARDIOGRAM REPORT   Patient Name:   Darren Pearson Date of Exam: 11/21/2019 Medical Rec #:  [id number]       Height:       68.0 in Accession #:    [id number]      Weight:       160.0 lb Date of Birth:  1965-12-19        BSA:          1.86 m Patient Age:    62 years        BP:           173/109 mmHg Patient Gender: M               HR:           79 bpm. Exam Location:  Inpatient Procedure: 2D Echo, Cardiac Doppler and Color Doppler Indications:    Stroke  History:        Patient has no prior history of Echocardiogram examinations.                 Risk Factors:Hypertension.  Sonographer:    Roseanna Rainbow RDCS Referring Phys: [id number] Robards Comments: Image acquisition challenging due to respiratory motion. IMPRESSIONS  1. Left ventricular ejection fraction, by visual estimation, is 45 to 50%. The left ventricle has low normal function. There is moderately increased left ventricular hypertrophy.  2. Left ventricular diastolic parameters are consistent with Grade I diastolic dysfunction (impaired relaxation).  3. The left ventricle demonstrates global hypokinesis.  4. Global right ventricle has mildly reduced systolic function.The right ventricular size is normal. No increase in right ventricular wall thickness.  5. Left atrial size was normal.  6. Right atrial size was normal.  7. The mitral valve is grossly normal. Trivial mitral valve regurgitation.  8. The tricuspid valve is normal in structure.  9. The aortic valve is tricuspid. Aortic valve regurgitation is  not visualized. Mild aortic valve sclerosis without stenosis. 10. The pulmonic valve was grossly normal. Pulmonic valve regurgitation is not visualized. 11. The inferior vena cava is normal in size with greater than 50% respiratory variability, suggesting right atrial pressure of 3 mmHg. 12. The interatrial septum was not well visualized. FINDINGS  Left Ventricle: Left ventricular ejection fraction, by visual estimation, is 45 to 50%. The left ventricle has low normal function. The left ventricle demonstrates global hypokinesis. There is moderately increased left ventricular hypertrophy. Left ventricular diastolic parameters are consistent with Grade I diastolic dysfunction (impaired relaxation). Indeterminate filling pressures. Right Ventricle: The right ventricular size is normal. No increase in right ventricular wall thickness. Global RV systolic function is has mildly reduced systolic function. Left Atrium: Left atrial size was normal in size. Right Atrium: Right atrial size was normal in size Pericardium: There is no evidence of pericardial effusion. Mitral Valve: The mitral valve  is grossly normal. Trivial mitral valve regurgitation. Tricuspid Valve: The tricuspid valve is normal in structure. Tricuspid valve regurgitation is not demonstrated. Aortic Valve: The aortic valve is tricuspid. Aortic valve regurgitation is not visualized. Mild aortic valve sclerosis is present, with no evidence of aortic valve stenosis. Pulmonic Valve: The pulmonic valve was grossly normal. Pulmonic valve regurgitation is not visualized. Pulmonic regurgitation is not visualized. Aorta: The aortic root and ascending aorta are structurally normal, with no evidence of dilitation. Venous: The inferior vena cava is normal in size with greater than 50% respiratory variability, suggesting right atrial pressure of 3 mmHg. IAS/Shunts: The interatrial septum was not well visualized.  LEFT VENTRICLE PLAX 2D LVIDd:         4.30 cm        Diastology LVIDs:         3.20 cm       LV e' lateral:   9.03 cm/s LV PW:         1.50 cm       LV E/e' lateral: 8.3 LV IVS:        1.30 cm       LV e' medial:    6.31 cm/s LVOT diam:     2.10 cm       LV E/e' medial:  11.9 LV SV:         42 ml LV SV Index:   22.48 LVOT Area:     3.46 cm  LV Volumes (MOD) LV area d, A2C:    30.70 cm LV area d, A4C:    28.70 cm LV area s, A2C:    20.70 cm LV area s, A4C:    19.30 cm LV major d, A2C:   8.27 cm LV major d, A4C:   8.49 cm LV major s, A2C:   7.52 cm LV major s, A4C:   7.19 cm LV vol d, MOD A2C: 95.5 ml LV vol d, MOD A4C: 80.0 ml LV vol s, MOD A2C: 48.8 ml LV vol s, MOD A4C: 43.0 ml LV SV MOD A2C:     46.7 ml LV SV MOD A4C:     80.0 ml LV SV MOD BP:      41.5 ml RIGHT VENTRICLE            IVC RV S prime:     9.44 cm/s  IVC diam: 1.40 cm TAPSE (M-mode): 1.9 cm LEFT ATRIUM             Index       RIGHT ATRIUM           Index LA diam:        2.00 cm 1.08 cm/m  RA Area:     10.90 cm LA Vol (A2C):   33.1 ml 17.81 ml/m RA Volume:   22.70 ml  12.21 ml/m LA Vol (A4C):   27.7 ml 14.90 ml/m LA Biplane Vol: 32.9 ml 17.70 ml/m  AORTIC VALVE LVOT Vmax:   83.60 cm/s LVOT Vmean:  56.400 cm/s LVOT VTI:    0.130 m  AORTA Ao Root diam: 3.50 cm Ao Asc diam:  3.20 cm MITRAL VALVE MV Area (PHT): 3.31 cm             SHUNTS MV PHT:        66.41 msec           Systemic VTI:  0.13 m MV Decel Time: 229 msec  Systemic Diam: 2.10 cm MV E velocity: 75.00 cm/s 103 cm/s MV A velocity: 80.60 cm/s 70.3 cm/s MV E/A ratio:  0.93       1.5  Lyman Bishop MD Electronically signed by Lyman Bishop MD Signature Date/Time: 11/21/2019/3:20:41 PM    Final    ECHO TEE  Result Date: 11/22/2019   TRANSESOPHOGEAL ECHO REPORT   Patient Name:   Darren Pearson Date of Exam: 11/22/2019 Medical Rec #:  [id number]       Height:       68.0 in Accession #:    [id number]      Weight:       160.0 lb Date of Birth:  02/15/1966        BSA:          1.86 m Patient Age:    89 years        BP:            230/119 mmHg Patient Gender: M               HR:           72 bpm. Exam Location:  Inpatient  Procedure: Transesophageal Echo, Cardiac Doppler and Saline Contrast Bubble            Study Indications:     Stroke  History:         Patient has prior history of Echocardiogram examinations, most                  recent 11/21/2019.  Sonographer:     Roseanna Rainbow RDCS Referring Phys:  [id number] Abigail Butts Diagnosing Phys: [name] MD  PROCEDURE: Normal Transesophogeal exam. Consent was requested emergently by emergency room physicain. Patients was under conscious sedation during this procedure. Anesthetic was administered intravenously by performing Physician: 20mcg of Fentanyl, 6mg  of Versed. The transesophogeal probe was passed through the esophogus of the patient. Imaged were obtained with the patient in a left lateral decubitus position. The patient's vital signs; including heart rate, blood pressure, and oxygen saturation; remained stable throughout the procedure. The patient developed no complications during the procedure. IMPRESSIONS  1. Left ventricular ejection fraction, by visual estimation, is 45 to 50%. The left ventricle has mildly decreased function. There is moderately increased left ventricular hypertrophy.  2. The left ventricle has no regional wall motion abnormalities.  3. Global right ventricle has normal systolic function.The right ventricular size is normal. No increase in right ventricular wall thickness.  4. Left atrial size was normal.  5. Right atrial size was normal.  6. The mitral valve is normal in structure. Mild mitral valve regurgitation. No evidence of mitral stenosis.  7. The tricuspid valve is normal in structure.  8. The aortic valve is normal in structure. Aortic valve regurgitation is not visualized. No evidence of aortic valve sclerosis or stenosis.  9. The pulmonic valve was normal in structure. Pulmonic valve regurgitation is not visualized. 10. The inferior vena cava is  normal in size with greater than 50% respiratory variability, suggesting right atrial pressure of 3 mmHg. 11. No intracardiac source of embolism. Negative bubble study. FINDINGS  Left Ventricle: Left ventricular ejection fraction, by visual estimation, is 45 to 50%. The left ventricle has mildly decreased function. The left ventricle has no regional wall motion abnormalities. There is moderately increased left ventricular hypertrophy. Concentric left ventricular hypertrophy. Normal left atrial pressure. Right Ventricle: The right ventricular size is normal. No increase in right ventricular wall  thickness. Global RV systolic function is has normal systolic function. Left Atrium: Left atrial size was normal in size. Right Atrium: Right atrial size was normal in size Pericardium: There is no evidence of pericardial effusion. Mitral Valve: The mitral valve is normal in structure. No evidence of mitral valve stenosis by observation. Mild mitral valve regurgitation. Tricuspid Valve: The tricuspid valve is normal in structure. Tricuspid valve regurgitation is mild. Aortic Valve: The aortic valve is normal in structure. Aortic valve regurgitation is not visualized. The aortic valve is structurally normal, with no evidence of sclerosis or stenosis. Pulmonic Valve: The pulmonic valve was normal in structure. Pulmonic valve regurgitation is not visualized. Aorta: The aortic root, ascending aorta and aortic arch are all structurally normal, with no evidence of dilitation or obstruction. Venous: The inferior vena cava is normal in size with greater than 50% respiratory variability, suggesting right atrial pressure of 3 mmHg. Shunts: Agitated saline contrast was given intravenously to evaluate for intracardiac shunting. Saline contrast bubble study was negative, with no evidence of any interatrial shunt. There is no evidence of a patent foramen ovale. No ventricular septal defect is seen or detected. There is no evidence of an  atrial septal defect. No atrial level shunt detected by color flow Doppler.  [name] MD Electronically signed by [name] MD Signature Date/Time: 11/22/2019/12:50:07 PM    Final    VAS [place] LOWER EXTREMITY VENOUS (DVT)  Result Date: 11/22/2019  Lower Venous Study Indications: Stroke.  Comparison Study: No prior study on file Performing Technologist: Sharion Dove RVS  Examination Guidelines: A complete evaluation includes B-mode imaging, spectral Doppler, color Doppler, and power Doppler as needed of all accessible portions of each vessel. Bilateral testing is considered an integral part of a complete examination. Limited examinations for reoccurring indications may be performed as noted.  +---------+---------------+---------+-----------+----------+--------------+ RIGHT    CompressibilityPhasicitySpontaneityPropertiesThrombus Aging +---------+---------------+---------+-----------+----------+--------------+ CFV      Full           Yes      Yes                                 +---------+---------------+---------+-----------+----------+--------------+ SFJ      Full                                                        +---------+---------------+---------+-----------+----------+--------------+ FV Prox  Full                                                        +---------+---------------+---------+-----------+----------+--------------+ FV Mid   Full                                                        +---------+---------------+---------+-----------+----------+--------------+ FV DistalFull                                                        +---------+---------------+---------+-----------+----------+--------------+  PFV      Full                                                        +---------+---------------+---------+-----------+----------+--------------+ POP      Full           Yes      Yes                                  +---------+---------------+---------+-----------+----------+--------------+ PTV      Partial                                                     +---------+---------------+---------+-----------+----------+--------------+ PERO     Full                                                        +---------+---------------+---------+-----------+----------+--------------+   +---------+---------------+---------+-----------+----------+--------------+ LEFT     CompressibilityPhasicitySpontaneityPropertiesThrombus Aging +---------+---------------+---------+-----------+----------+--------------+ CFV      Full           Yes      Yes                                 +---------+---------------+---------+-----------+----------+--------------+ SFJ      Full                                                        +---------+---------------+---------+-----------+----------+--------------+ FV Prox  Full                                                        +---------+---------------+---------+-----------+----------+--------------+ FV Mid   Full                                                        +---------+---------------+---------+-----------+----------+--------------+ FV DistalFull                                                        +---------+---------------+---------+-----------+----------+--------------+ PFV      Full                                                        +---------+---------------+---------+-----------+----------+--------------+  POP      Full           Yes      Yes                                 +---------+---------------+---------+-----------+----------+--------------+ PTV      Full                                                        +---------+---------------+---------+-----------+----------+--------------+ PERO     Full                                                         +---------+---------------+---------+-----------+----------+--------------+     Summary: Right: There is no evidence of deep vein thrombosis in the lower extremity. Left: There is no evidence of deep vein thrombosis in the lower extremity.  *See table(s) above for measurements and observations. Electronically signed by Ruta Hinds MD on 11/22/2019 at 1:46:38 PM.    Final     PHYSICAL EXAM    Temp:  [98.1 F (36.7 C)-98.7 F (37.1 C)] 98.7 F (37.1 C) (12/29 1231) Pulse Rate:  [67-96] 96 (12/29 1231) Resp:  [16-22] 18 (12/29 1231) BP: (152-230)/(82-135) 156/88 (12/29 1231) SpO2:  [97 %-100 %] 100 % (12/29 1231)  General - Well nourished, well developed, in no apparent distress.  Ophthalmologic - fundi not visualized due to noncooperation.  Cardiovascular - Regular rhythm and rate.  Mental Status -  Level of arousal and orientation to time, place, and person were intact. Language including expression, naming, repetition, comprehension was assessed and found intact. Fund of Knowledge was assessed and was intact.  Cranial Nerves II - XII - II - Visual field intact OU. III, IV, VI - Extraocular movements intact. V - Facial sensation intact bilaterally. VII - Facial movement intact bilaterally. VIII - Hearing & vestibular intact bilaterally. X - Palate elevates symmetrically. XI - Chin turning & shoulder shrug intact bilaterally. XII - Tongue protrusion intact.  Motor Strength - The patient's strength was normal in all extremities and pronator drift was absent. 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 Bulk was normal and fasciculations were absent.   Motor Tone - Muscle tone was assessed at the neck and appendages and was normal.  Reflexes - The patient's reflexes were symmetrical in all extremities and he had no pathological reflexes.  Sensory - Light touch, temperature/pinprick were assessed and were symmetrical. B/l LE  proprioception intact.   Coordination - The patient had normal movements in the hands with no ataxia or dysmetria. However, left heel to shin was mildly ataxic. Right HTS was unremarkable. Tremor was absent.  Gait and Station - deferred.   ASSESSMENT/PLAN [name] is a 53 y.o. male with history of hepatitis C and HTN presenting with poor balance for 1.5 weeks, having fallen 3 times with unsteadiness that would resolve with rest. MRI showed acute punctate B cortical infarcts. Not a tPA candidate as out of the window.   Stroke: punctate left CR, L mesial temporal lobe, right  parietal cortical, and right pontine infarcts - embolic pattern - secondary to unknown source  CT head no acute abnormality. Small vessel disease. Atrophy.   MRI  Acute/subacute B cortical L postcentral gyrus and corona radiata infarcts, punctate R cortical parietal lobe infarct. Small vessel disease. Atrophy. Old hemorrhagic lacunes R caudate head and R cerebellum. Sinus dz.   CTA head & neck no LVO. Small paraclinoid L ICA aneurysm   LE Doppler no DVT  2D Echo EF 45-50%  TEE No intracardiac thrombus, negative bubble study, no PFO.   Loop recorder placed 12.29 ([facility]) to look for embolic source   LDL 74  HgbA1c 4.2  UDS neg  HIV negative   Hypercoagulable labs negative thus far, most remain pending  TSH normal  B12 = 170  RPR negative   Lovenox 40 mg sq daily  for VTE prophylaxis  No antithrombotic prior to admission, now on aspirin 81 mg daily and clopidogrel 75 mg daily. Continue DAPT for 3 weeks and then ASA alone  Therapy recommendations:  HH PT, HH OT  Disposition:  Return home  Hx of cocaine abuse  Last use 05/2019  UDS this admission neg  Educated on continued cocaine cessation  Tobacco abuse  Current smoker  Smoking cessation counseling provided  Nicotine patch provided  Pt is willing to quit  Hypertension Hypertensive Urgency . Home meds: none . Permissive  hypertension (OK if <220/120) for 24-48 hours post stroke and then gradually normalized within 3-5 days.  Long term BP goal normotensive  Hyperlipidemia  Home meds:  No statin   LDL 74, goal < 70  Now on Lipitor 40  Continue statin at discharge  B12 deficiency  B12 = 170  Pt symptoms not typical for subacute combined degeneration  No proprioception/vibration sensation loss on exam  B12 supplement with IM and po  Other Stroke Risk Factors  ETOH use, advised to stop d/t stroke risk   Hx of marijuana use, advised to stop   Other Active Problems  Hypokalemia  AKI, Elevated Cre 1.27->1.19   Elevated NH4 = 55  Hospital day # 2   Neurology will sign off. Please call with questions. Pt will follow up with stroke clinic NP at [facility] in about 4 weeks. Thanks for the consult.  Rosalin Hawking, MD PhD Stroke Neurology 11/22/2019 3:07 PM    To contact Stroke Continuity provider, please refer to [email or web address] After hours, contact General Neurology

## 2019-11-22 NOTE — Discharge Instructions (Signed)
Darren Pearson,  You were in the hospital secondary to a stroke. You will be discharged on new medication. You also received a loop recorder which will monitor your heart rhythm. Please follow-up with a primary care physician. Please follow-up with the neurologist.    Care After Your Loop Recorder  . You have a Med[employer]onic Loop Recorder   . Monitor your cardiac device site for redness, swelling, and drainage. Call the device clinic at [phone] if you experience these symptoms or fever/chills.  . You may shower 3 days after your loop recorder implant and wash your incision with soap and water. Avoid lotions, ointments, or perfumes over your incision until it is well-healed.  . You may use a hot tub or a pool AFTER your wound check appointment if the incision is completely closed.  . Your device is MRI compatible.   . Remote monitoring is used to monitor your cardiac device from home. This monitoring is scheduled every month days by our office. It allows [place] to keep an eye on the functioning of your device to ensure it is working properly.

## 2019-11-22 NOTE — Progress Notes (Signed)
Occupational Therapy Treatment Patient Details Name: Darren Pearson MRN: [id number] DOB: [birth year] Today's Date: 11/22/2019    History of present illness Pt is 53 yo male with hep C and HTN who presents with over a week of poor balance and at least 3 falls. MRI brain revealed acute/subacute punctate bilateral cortical infarctions involving the right postcentral gyrus and left corona radiata, as well as an equivocal focus of restricted diffusion in the medial right pons suggestive of acute infarction vs T2-shine through artifact. Also noted are remote hemorrhagic lacunar infarcts of the right caudate head and right cerebellum. Chronic small vessel ischemic changes and mild atrophy also noted.    OT comments  Pt received in bed awake. Pt had TEE earlier this date. Pt required minA initially to progress to EOB, he had significant loss of balance toward the right, was unable to self-correct and required assistance from therapist for 5 min, able to maintain balance for 3 min, then unstable and required external assistance again. Pt also reported difficulty with word finding this morning.No reports of dizziness or lightheadedness. Pt required modA to powerup into standing and minA for stand-pivot transfer. Pt also demonstrated increased weakness in his RLE, he was unable to clear the floor with right foot when standing. Attempted pillbox assessment with pt, while reading the labels his speech became slurred, cleared up when RN arrived. Unsure if changes above were due to anesthesia, RN notified and arrived to room to discuss acute changes with therapist.  Do not feel pt is appropriate to discharge home at this time as he will need to be functioning at a modified independent level. Pt will continue to benefit from skilled OT services to maximize safety and independence with ADL/IADL and functional mobility. Will continue to follow acutely and progress as tolerated.    Follow Up Recommendations  Home health  OT    Equipment Recommendations  3 in 1 bedside commode    Recommendations for Other Services      Precautions / Restrictions Precautions Precautions: Fall Precaution Comments: 3 falls at home before coming in Restrictions Weight Bearing Restrictions: No       Mobility Bed Mobility Overal bed mobility: Needs Assistance Bed Mobility: Supine to Sit     Supine to sit: Min assist     General bed mobility comments: minA initially to stabilize in sitting  Transfers Overall transfer level: Needs assistance Equipment used: Straight cane Transfers: Sit to/from Stand;Stand Pivot Transfers Sit to Stand: Min assist;Mod assist Stand pivot transfers: Min assist       General transfer comment: modA for initial sit<>stand for powerup and stability in standing;pt progressed to minA for sit<>stand     Balance Overall balance assessment: Needs assistance Sitting-balance support: Feet supported;Bilateral upper extremity supported Sitting balance-Leahy Scale: Poor Sitting balance - Comments: pt requiring external assistance from therapist to stabilize in sitting this session, progressed to minguard after 5 min then required minA again;RN notified  Postural control: Right lateral lean Standing balance support: Single extremity supported Standing balance-Leahy Scale: Poor Standing balance comment: reliant on spc and support from therapist                           ADL either performed or assessed with clinical judgement   ADL Overall ADL's : Needs assistance/impaired Eating/Feeding: Set up;Sitting                       Toilet Transfer: Minimal  assistance;Stand-pivot(cane) Armed forces technical officer Details (indicate cue type and reason): simulated from eob to recliner Toileting- Clothing Manipulation and Hygiene: Sit to/from stand;Minimal assistance Toileting - Clothing Manipulation Details (indicate cue type and reason): minA to powerup from lower surface      Functional mobility during ADLs: Minimal assistance(spc) General ADL Comments: minA for stability with functional mobiltiy     Vision       Perception     Praxis      Cognition Arousal/Alertness: Awake/alert Behavior During Therapy: WFL for tasks assessed/performed Overall Cognitive Status: Impaired/Different from baseline(Simultaneous filing. User may not have seen previous data.) Area of Impairment: Memory;Awareness                     Memory: Decreased short-term memory     Awareness: Emergent   General Comments: required cues to use cane with mobiltiy;attempted to assess cognition with pill box assessment, but cut short due to food arrival and pt requuest to eat first,        Exercises Exercises: Other exercises Other Exercises Other Exercises: slow, controlled HS in bed, 5x   Shoulder Instructions       General Comments pt reading labels on pillbox assessment, began slurring words, RN notified    Pertinent Vitals/ Pain       Pain Assessment: No/denies pain  Home Living                                      Lives With: (P) (sister)    Prior Functioning/Environment              Frequency  Min 2X/week        Progress Toward Goals  OT Goals(current goals can now be found in the care plan section)  Progress towards OT goals: Not progressing toward goals - comment(pt with increased weakness in RLE, and instability)  Acute Rehab OT Goals Patient Stated Goal: return home and get back to work OT Goal Formulation: With patient Time For Goal Achievement: 12/05/19 Potential to Achieve Goals: Good ADL Goals Pt Will Perform Grooming: with modified independence Pt Will Perform Upper Body Dressing: with modified independence Pt Will Perform Lower Body Dressing: with modified independence;sit to/from stand Pt Will Transfer to Toilet: with modified independence;ambulating Additional ADL Goal #1: Pt will demonstrate independence  with medication managment.  Plan Discharge plan needs to be updated    Co-evaluation                 AM-PAC OT "6 Clicks" Daily Activity     Outcome Measure   Help from another person eating meals?: A Little Help from another person taking care of personal grooming?: A Little Help from another person toileting, which includes using toliet, bedpan, or urinal?: A Little Help from another person bathing (including washing, rinsing, drying)?: A Little Help from another person to put on and taking off regular upper body clothing?: A Little Help from another person to put on and taking off regular lower body clothing?: A Little 6 Click Score: 18    End of Session Equipment Utilized During Treatment: Gait belt(spc)  OT Visit Diagnosis: Unsteadiness on feet (R26.81);Other abnormalities of gait and mobility (R26.89);Muscle weakness (generalized) (M62.81);History of falling (Z91.81)   Activity Tolerance Patient tolerated treatment well   Patient Left in chair;with call bell/phone within reach;with chair alarm set   Nurse Communication Mobility status(balance, weakness, and slurred  speech)        [id number] OT Time Calculation (min): 28 min  Charges: OT General Charges $OT Visit: 1 Visit OT Treatments $Self Care/Home Management : 8-22 mins $Cognitive Funtion inital: Initial 15 mins  Dorinda Hill OTR/L Acute Rehabilitation Services Office: [place] 11/22/2019, 2:50 PM

## 2019-11-22 NOTE — CV Procedure (Signed)
     Transesophageal Echocardiogram Note  Darren Pearson [id number] [birth year]  Procedure: Transesophageal Echocardiogram Indications: stroke  Procedure Details Consent: Obtained Time Out: Verified patient identification, verified procedure, site/side was marked, verified correct patient position, special equipment/implants available, Radiology Safety Procedures followed,  medications/allergies/relevent history reviewed, required imaging and test results available.  Performed  Medications: During this procedure the patient is administered a total of Versed 6 mg and Fentanyl 50 mg to achieve and maintain moderate conscious sedation.  The patient's heart rate, blood pressure, and oxygen saturation are monitored continuously during the procedure. The period of conscious sedation is 30 minutes, of which I was present face-to-face 100% of this time.  No intracardiac thrombus, negative bubble study, no PFO.  Complications: No apparent complications Patient did tolerate procedure well.  [name], MD, [facility] 11/22/2019, 11:28 AM

## 2019-11-22 NOTE — Plan of Care (Signed)
Patient is progressing towards care plan goal

## 2019-11-22 NOTE — Progress Notes (Signed)
Physical Therapy Treatment Patient Details Name: Darren Pearson MRN: [id number] DOB: December 12, 1965 Today's Date: 11/22/2019    History of Present Illness Pt is 53 yo male with hep C and HTN who presents with over a week of poor balance and at least 3 falls. MRI brain revealed acute/subacute punctate bilateral cortical infarctions involving the right postcentral gyrus and left corona radiata, as well as an equivocal focus of restricted diffusion in the medial right pons suggestive of acute infarction vs T2-shine through artifact. Also noted are remote hemorrhagic lacunar infarcts of the right caudate head and right cerebellum. Chronic small vessel ischemic changes and mild atrophy also noted.     PT Comments    Pt progressing with mobility, trained with a cane today and pt was able to progress from min A to min-guard on level surfaces in controlled environment. Still remains a high fall risk in a community environment and will need ongoing rehab for balance. Occasional L stagger with slow response to recover. Pt also reports some STM deficits that he is noticing that are new for him. PT will continue to follow.    Follow Up Recommendations  Home health PT     Equipment Recommendations  Cane    Recommendations for Other Services       Precautions / Restrictions Precautions Precautions: Fall Precaution Comments: 3 falls at home before coming in Restrictions Weight Bearing Restrictions: No    Mobility  Bed Mobility Overal bed mobility: Modified Independent                Transfers Overall transfer level: Needs assistance   Transfers: Sit to/from Stand Sit to Stand: Min guard         General transfer comment: initially unsteady with standing but self corrected. Min guard for safety  Ambulation/Gait Ambulation/Gait assistance: Min assist;Min guard   Assistive device: None Gait Pattern/deviations: Step-through pattern;Ataxic;Staggering left Gait velocity:  decreased Gait velocity interpretation: 1.31 - 2.62 ft/sec, indicative of limited community ambulator General Gait Details: consistent L lean but not out of BOS. Decreased foot clearance L and mild ataxia LLE noted. Min A to prevent fall.    Stairs             Wheelchair Mobility    Modified Rankin (Stroke Patients Only) Modified Rankin (Stroke Patients Only) Pre-Morbid Rankin Score: No significant disability Modified Rankin: Moderately severe disability     Balance Overall balance assessment: Needs assistance Sitting-balance support: Feet supported;No upper extremity supported Sitting balance-Leahy Scale: Good     Standing balance support: Single extremity supported Standing balance-Leahy Scale: Fair Standing balance comment: worked on quick changes in direction with ambulation, this does cause pt LOB. Discussed balance activities to work on this including tandem, rhomberg, and unilateral stance.                             Cognition Arousal/Alertness: Awake/alert Behavior During Therapy: WFL for tasks assessed/performed Overall Cognitive Status: Impaired/Different from baseline Area of Impairment: Memory                     Memory: Decreased short-term memory         General Comments: decreased safety awareness      Exercises Other Exercises Other Exercises: slow, controlled HS in bed, 5x    General Comments General comments (skin integrity, edema, etc.): pt able to verbalize B and T in BEFAST, reviewed other signs.  Pertinent Vitals/Pain Pain Assessment: No/denies pain    Home Living                      Prior Function            PT Goals (current goals can now be found in the care plan section) Acute Rehab PT Goals Patient Stated Goal: return home and get back to work PT Goal Formulation: With patient Time For Goal Achievement: 12/05/19 Potential to Achieve Goals: Good Progress towards PT goals: Progressing  toward goals    Frequency    Min 4X/week      PT Plan Current plan remains appropriate    Co-evaluation              AM-PAC PT "6 Clicks" Mobility   Outcome Measure  Help needed turning from your back to your side while in a flat bed without using bedrails?: None Help needed moving from lying on your back to sitting on the side of a flat bed without using bedrails?: None Help needed moving to and from a bed to a chair (including a wheelchair)?: A Little Help needed standing up from a chair using your arms (e.g., wheelchair or bedside chair)?: A Little Help needed to walk in hospital room?: A Little Help needed climbing 3-5 steps with a railing? : A Little 6 Click Score: 20    End of Session Equipment Utilized During Treatment: Gait belt Activity Tolerance: Patient tolerated treatment well Patient left: in bed;with call bell/phone within reach;with bed alarm set Nurse Communication: Mobility status PT Visit Diagnosis: Unsteadiness on feet (R26.81);Repeated falls (R29.6)     Time: [id number] PT Time Calculation (min) (ACUTE ONLY): 20 min  Charges:  $Gait Training: 8-22 mins                     Leighton Roach, [place]  Pager [phone] Office [place] 11/22/2019, 1:27 PM

## 2019-11-22 NOTE — Progress Notes (Signed)
SLP Cancellation Note  Patient Details Name: Darren Pearson MRN: [id number] DOB: February 06, 1966   Cancelled treatment:        Attempted to see pt for cognitive linguistic evaluation.  Pt was off floor with endo at time of SLP attempt.  Will reattempt as schedule permits   Celedonio Savage, [place], [place] Office: [phone]  11/22/2019, 12:38 PM

## 2019-11-22 NOTE — Interval H&P Note (Signed)
History and Physical Interval Note:  11/22/2019 2:50 PM  Darren Pearson  has presented today for surgery, with the diagnosis of stroke.  The various methods of treatment have been discussed with the patient and family. After consideration of risks, benefits and other options for treatment, the patient has consented to  Procedure(s): LOOP RECORDER INSERTION (N/A) as a surgical intervention.  The patient's history has been reviewed, patient examined, no change in status, stable for surgery.  I have reviewed the patient's chart and labs.  Questions were answered to the patient's satisfaction.     Thompson Grayer

## 2019-11-22 NOTE — Progress Notes (Signed)
    [facility] has been requested to perform a transesophageal echocardiogram on 11/22/2019 for stroke.  After careful review of history and examination, the risks and benefits of transesophageal echocardiogram have been explained including risks of esophageal damage, perforation (1:10,000 risk), bleeding, pharyngeal hematoma as well as other potential complications associated with conscious sedation including aspiration, arrhythmia, respiratory failure and death. Alternatives to treatment were discussed, questions were answered. Patient is willing to proceed.   TEE scheduled for 11/22/2019 at 11am with Dr. Meda Coffee.  Roby Lofts, PA-C 11/22/2019 8:40 AM

## 2019-11-22 NOTE — TOC Transition Note (Signed)
Transition of Care [facility]) - CM/SW Discharge Note   Patient Details  Name: WESTON WHITTAKER MRN: [id number] Date of Birth: 02-15-1966  [facility] CM/SW Contact:  Pollie Friar, RN Phone Number: 11/22/2019, 4:02 PM   Clinical Narrative:    Pt discharging home with [facility] services through [facility] charity program. Karsten Fells with [facility] accepted the referral. Cane delivered to the room under charity from [place].  CM asked MD to send d/c meds to [place] so CM can assist with the cost.  CM has placed new PCP information on the AVS.  Pt has transportation home.   Final next level of care: [place] Barriers to Discharge: Inadequate or no insurance, Barriers Unresolved (comment)   Patient Goals and CMS Choice     Choice offered to / list presented to : Patient  Discharge Placement                       Discharge Plan and Services   Discharge Planning Services: CM Consult Post Acute Care Choice: Home Health          DME Arranged: Kasandra Knudsen DME Agency: [employer] Date DME Agency Contacted: 11/22/19   Representative spoke with at DME Agency: [place]: PT [place]: [facility] [place] Date [facility] Agency Contacted: 11/22/19   Representative spoke with at [place]: [place] ([place]) Interventions     Readmission Risk Interventions No flowsheet data found.

## 2019-11-22 NOTE — Evaluation (Signed)
Speech Language Pathology Evaluation Patient Details Name: Darren Pearson MRN: [id number] DOB: [birth year] Today's Date: 11/22/2019 Time: [id number] SLP Time Calculation (min) (ACUTE ONLY): 29 min  Problem List:  Patient Active Problem List   Diagnosis Date Noted  . Acute CVA (cerebrovascular accident) ([place]) 11/20/2019  . Hypokalemia 11/20/2019  . Accelerated hypertension 11/20/2019  . Hyperammonemia ([place]) 11/20/2019  . Elevated serum creatinine 11/20/2019  . Elevated blood pressure 03/15/2015  . Rash and nonspecific skin eruption 03/15/2015  . Hepatitis C 04/12/2013  . Allergic rhinitis 04/12/2013  . Unspecified asthma(493.90) 04/12/2013   Past Medical History:  Past Medical History:  Diagnosis Date  . Allergy   . Anxiety and depression   . Asthma   . Hepatitis C   . History of chicken pox   . Hypertension    Past Surgical History: History reviewed. No pertinent surgical history. HPI:  Pt is 53 yo male with hep C and HTN who presents with over a week of poor balance and at least 3 falls. MRI brain revealed acute/subacute punctate bilateral cortical infarctions involving the right postcentral gyrus and left corona radiata, as well as an equivocal focus of restricted diffusion in the medial right pons suggestive of acute infarction vs T2-shine through artifact. Also noted are remote hemorrhagic lacunar infarcts of the right caudate head and right cerebellum. Chronic small vessel ischemic changes and mild atrophy also noted.   Assessment / Plan / Recommendation Clinical Impression    Pt seen at bedside for evaluation of speech/language and cognitive linguistic skills. Pt oriented x4, speech was clear and intelligible, with appropriate affect. No dysarthria noted. Pt's cognitive linguistic skills appear to be at or close to baseline and are functionally adequate.  SLP utilized an informal clock drawing task to assess for neglect, spatial/planning, visuospatial deficits. Patient able  to complete task adequately. Patient placed numbers in correct order, correctly spaced numbers and set the clock hands to indicate the correct (prompted) time.  Pt was assessed using the COGNISTAT (see below for additional information).  Pt performed within the average range on all subtests except for the delayed recall task and calculations task. Overall, patient performance is [facility] and does not appear to be indicative of a significant acute change.    COGNISTAT - all subtests are within the average range, except where otherwise specified Orientation: 12/12 Attention: 7/8 Comprehension: 4/6 Repetition: 12/12 Naming: 7/8 Construction: not assessed Memory: 5/12, moderate impairment. Pt benefited from semantic cues for recall.  Calculations: 2/4, mild impairment.  Similarities: 8/8 Judgment: 4/6, WNL  Patient transported to procedure immediately following this evaluation. Inpatient SLP services for cognitive-linguistic skills not indicated at this time. Any cognitive-linguistic deficits can be addressed at the next level of care, may consider OP SLP or neuropsychology referral should symptoms persist and interfere with functioning.    SLP Assessment  SLP Recommendation/Assessment: Patient does not need any further Speech Lanaguage Pathology Services SLP Visit Diagnosis: Cognitive communication deficit (R41.841)    Follow Up Recommendations  Outpatient SLP;Other (comment)(consider OP SLP or neuropsych referral should symptoms persi)    Frequency and Duration           SLP Evaluation Cognition  Overall Cognitive Status: Within Functional Limits for tasks assessed Arousal/Alertness: Awake/alert Orientation Level: Oriented X4 Attention: Sustained Sustained Attention: Appears intact Memory: Impaired Memory Impairment: Other (comment)(delayed recall deficit) Awareness: Appears intact Problem Solving: Appears intact       Comprehension  Auditory Comprehension Overall Auditory  Comprehension: Appears within functional limits for tasks  assessed Visual Recognition/Discrimination Discrimination: Within Function Limits    Expression Expression Primary Mode of Expression: Verbal Verbal Expression Overall Verbal Expression: Appears within functional limits for tasks assessed Repetition: No impairment Naming: No impairment Written Expression Dominant Hand: Left Written Expression: Not tested   Oral / Motor  Motor Speech Overall Motor Speech: Appears within functional limits for tasks assessed Respiration: Within functional limits Phonation: Normal Intelligibility: Intelligible   GO                    Marina Goodell, M.Ed., CCC-SLP Speech Therapy Acute Rehabilitation  11/22/2019, 3:59 PM

## 2019-11-22 NOTE — H&P (View-Only) (Signed)
    [facility] has been requested to perform a transesophageal echocardiogram on 11/22/2019 for stroke.  After careful review of history and examination, the risks and benefits of transesophageal echocardiogram have been explained including risks of esophageal damage, perforation (1:10,000 risk), bleeding, pharyngeal hematoma as well as other potential complications associated with conscious sedation including aspiration, arrhythmia, respiratory failure and death. Alternatives to treatment were discussed, questions were answered. Patient is willing to proceed.   TEE scheduled for 11/22/2019 at 11am with Dr. Meda Coffee.  Roby Lofts, PA-C 11/22/2019 8:40 AM

## 2019-11-22 NOTE — Interval H&P Note (Signed)
History and Physical Interval Note:  11/22/2019 11:12 AM  Darren Pearson  has presented today for surgery, with the diagnosis of stroke.  The various methods of treatment have been discussed with the patient and family. After consideration of risks, benefits and other options for treatment, the patient has consented to  Procedure(s): TRANSESOPHAGEAL ECHOCARDIOGRAM (TEE) (N/A) as a surgical intervention.  The patient's history has been reviewed, patient examined, no change in status, stable for surgery.  I have reviewed the patient's chart and labs.  Questions were answered to the patient's satisfaction.     [name]

## 2019-11-22 NOTE — Progress Notes (Signed)
PROGRESS NOTE    Darren Pearson  [id number] DOB: 10/13/1966 DOA: 11/20/2019 PCP: Jearld Fenton, NP   Brief Narrative: Darren Pearson is a 53 y.o. male with Past medical history of hypertension, hep C. Patient presented secondary to a fall and found to have an acute stroke.   Assessment & Plan:   Active Problems:   Acute CVA (cerebrovascular accident) ([place])   Hypokalemia   Accelerated hypertension   Hyperammonemia (HCC)   Elevated serum creatinine   Acute CVA MRI significant for acute/subacute bilateral cortical infarction of left postcentral gyrus/corona radiata in addition to punctate cortical infarct of the right parietal lobe; also noted to have remote infarcts and evidence of likely chronic microvascular ischemia. Transesophageal Echocardiogram significant for no embolic source. LDL of 74. Hemoglobin A1C of 4.2%. Neurology on board and recommended hypercoagulability workup; Lupus anticoagulant panel, b-2-glycoprotein IgG/M/A, cardiolipin IgG/M/A. Started on aspirin and Plavix with recommendations for indefinite aspirin use and 3 weeks of Plavix. Patient underwent Transesophageal Echocardiogram which was significant for no embolic source. Loop recorder placed prior to discharge. Holding on discharge secondary to concern for slight worsening of mobility with OT. Will monitor overnight as this may be related to anesthesia; if not, patient may need to be recommended for SNF.  Essential hypertension Hypertensive urgency Patient is onno antihypertensivesas an outpatient. Start lisinopril 20 mg daily today. Will need repeat BMP in 2 weeks.  Hypokalemia Given supplementation.  AKI Creatinine of 1.27 on admission. Resolved.  Elevated ammonia Does not appear to have any evidence of encephalopathy. Normal AST/ALT/Alkaline phosphatase. Will need outpatient follow-up.  Hyperlipidemia Lipitor 40 mg daily on discharge  Vitamin B12 deficiency Continue vitamin B12  vitamins  ICA aneurysm Small and noted on CTA neck. Will need good blood pressure control.  Systolic heart dysfunction Seen on Transthoracic Echocardiogram. No evidence of heart failure symptoms.  Tobacco use Counseled on admissionand on 12/28. Patient motivated. Recommend outpatient nicotine patch and close PCP follow-up.  DVT prophylaxis: Lovenox Code Status:   Code Status: Full Code Family Communication: None Disposition Plan: Discharge pending continued stroke workup and therapy recommendations   Consultants:   Neurology  Procedures:   None  Antimicrobials:  None    Subjective: Some left sided rib pain  Objective: Vitals:   11/22/19 1134 11/22/19 1138 11/22/19 1210 11/22/19 1231  BP: (!) 182/89 (!) 168/92 (!) 159/82 (!) 156/88  Pulse:  84 81 96  Resp:  19 20 18   Temp:  98.1 F (36.7 C)  98.7 F (37.1 C)  TempSrc:  Temporal  Oral  SpO2:  98% 97% 100%  Weight:      Height:        Intake/Output Summary (Last 24 hours) at 11/22/2019 1619 Last data filed at 11/22/2019 0356 Gross per 24 hour  Intake --  Output 1200 ml  Net -1200 ml   Filed Weights   11/20/19 0825  Weight: 72.6 kg    Examination:  General exam: Appears calm and comfortable  Respiratory system: Clear to auscultation. Respiratory effort normal. Cardiovascular system: S1 & S2 heard, RRR. No murmurs, rubs, gallops or clicks. Gastrointestinal system: Abdomen is nondistended, soft and nontender. No organomegaly or masses felt. Normal bowel sounds heard. Central nervous system: Alert and oriented. 5/5 strength Musculoskeletal: No edema. No calf tenderness. Left sided reproducible rib pain Skin: No cyanosis. No rashes Psychiatry: Judgement and insight appear normal. Mood & affect appropriate.      Data Reviewed: I have personally reviewed following labs and imaging  studies  CBC: Recent Labs  Lab 11/20/19 1006  WBC 6.4  NEUTROABS 4.4  HGB 12.2*  HCT 38.0*  MCV 95.5  PLT [id number]    Basic Metabolic Panel: Recent Labs  Lab 11/20/19 1006 11/21/19 1317  NA 140 140  K 3.1* 3.7  CL 104 106  CO2 26 27  GLUCOSE 91 95  BUN 13 10  CREATININE 1.27* 1.19  CALCIUM 8.8* 8.7*   GFR: Estimated Creatinine Clearance: 68.7 mL/min (by C-G formula based on SCr of 1.19 mg/dL). Liver Function Tests: Recent Labs  Lab 11/20/19 1006  AST 24  ALT 13  ALKPHOS 89  BILITOT 1.0  PROT 8.0  ALBUMIN 3.7   No results for input(s): LIPASE, AMYLASE in the last 168 hours. Recent Labs  Lab 11/20/19 1006  AMMONIA 55*   Coagulation Profile: No results for input(s): INR, PROTIME in the last 168 hours. Cardiac Enzymes: No results for input(s): CKTOTAL, CKMB, CKMBINDEX, TROPONINI in the last 168 hours. BNP (last 3 results) No results for input(s): PROBNP in the last 8760 hours. HbA1C: Recent Labs    11/22/19 0303  HGBA1C 4.2*   CBG: No results for input(s): GLUCAP in the last 168 hours. Lipid Profile: Recent Labs    11/22/19 0303  CHOL 139  HDL 54  LDLCALC 74  TRIG 53  CHOLHDL 2.6   Thyroid Function Tests: Recent Labs    11/21/19 0752  TSH 1.345   Anemia Panel: Recent Labs    11/21/19 0752  VITAMINB12 170*   Sepsis Labs: No results for input(s): PROCALCITON, LATICACIDVEN in the last 168 hours.  Recent Results (from the past 240 hour(s))  Urine culture     Status: None   Collection Time: 11/20/19 10:06 AM   Specimen: Urine, Random  Result Value Ref Range Status   Specimen Description   Final    URINE, RANDOM Performed at [place] [address] [place], [place] [address]    Special Requests   Final    NONE Performed at [facility], [place] [address], [place], [place] [address]    Culture   Final    NO GROWTH Performed at [facility], [place] [address] [place],  [address]    Report Status 11/21/2019 FINAL  Final  SARS CORONAVIRUS 2 (TAT 6-24 HRS) Nasopharyngeal Nasopharyngeal Swab     Status:  None   Collection Time: 11/20/19  4:07 PM   Specimen: Nasopharyngeal Swab  Result Value Ref Range Status   SARS Coronavirus 2 NEGATIVE NEGATIVE Final    Comment: (NOTE) SARS-CoV-2 target nucleic acids are NOT DETECTED. The SARS-CoV-2 RNA is generally detectable in upper and lower respiratory specimens during the acute phase of infection. Negative results do not preclude SARS-CoV-2 infection, do not rule out co-infections with other pathogens, and should not be used as the sole basis for treatment or other patient management decisions. Negative results must be combined with clinical observations, patient history, and epidemiological information. The expected result is Negative. Fact Sheet for Patients: SugarRoll.be Fact Sheet for Healthcare Providers: https://www.woods-mathews.com/ This test is not yet approved or cleared by the [place] FDA and  has been authorized for detection and/or diagnosis of SARS-CoV-2 by FDA under an Emergency Use Authorization (EUA). This EUA will remain  in effect (meaning this test can be used) for the duration of the COVID-19 declaration under Section 56 4(b)(1) of the Act, 21 U.S.C. section 360bbb-3(b)(1), unless the authorization is terminated or revoked sooner. Performed  at [facility], [place] [address], [place], [place] [address]          Radiology Studies: CT ANGIO HEAD W OR WO CONTRAST  Result Date: 11/21/2019 CLINICAL DATA:  Stroke, follow-up EXAM: CT ANGIOGRAPHY HEAD AND NECK TECHNIQUE: Multidetector CT imaging of the head and neck was performed using the standard protocol during bolus administration of intravenous contrast. Multiplanar CT image reconstructions and MIPs were obtained to evaluate the vascular anatomy. Carotid stenosis measurements (when applicable) are obtained utilizing NASCET criteria, using the distal internal carotid diameter as the denominator. CONTRAST:  76mL OMNIPAQUE  IOHEXOL 350 MG/ML SOLN COMPARISON:  None. FINDINGS: CTA NECK FINDINGS Aortic arch: Great vessel origins are patent. Right carotid system: Common, internal, and external carotid arteries are patent. There is minor plaque without measurable stenosis at the ICA origin. Left carotid system: Common, internal, and external carotid arteries are patent. There is minor plaque without measurable stenosis at the ICA origin. Vertebral arteries: Patent. Right vertebral artery is dominant. There is focal moderate stenosis of the proximal right vertebral artery just beyond the origin. Skeleton: Degenerative changes are present, greatest at C6-C7. Other neck: No neck mass or adenopathy. Upper chest: Bullous changes at the lung apices. Paraseptal and centrilobular emphysema. Review of the MIP images confirms the above findings CTA HEAD FINDINGS Anterior circulation: Intracranial internal carotid arteries are patent with mild calcified plaque. There is a 2 x 1 mm laterally directed outpouching from the paraclinoid left ICA. Anterior and middle cerebral arteries are patent. Posterior circulation: Intracranial right vertebral artery is patent with mild calcified and noncalcified plaque. Left vertebral artery terminates as a PICA. Basilar artery is patent. Posterior cerebral arteries are patent. Right larger than left posterior communicating arteries are present. Venous sinuses: As permitted by contrast timing, patent. Anatomic variants: Fetal origin of the right posterior cerebral artery. Review of the MIP images confirms the above findings IMPRESSION: No large vessel occlusion or hemodynamically significant stenosis. Small aneurysm of the paraclinoid left ICA. Electronically Signed   By: Macy Mis M.D.   On: 11/21/2019 10:06   CT ANGIO NECK W OR WO CONTRAST  Result Date: 11/21/2019 CLINICAL DATA:  Stroke, follow-up EXAM: CT ANGIOGRAPHY HEAD AND NECK TECHNIQUE: Multidetector CT imaging of the head and neck was performed using  the standard protocol during bolus administration of intravenous contrast. Multiplanar CT image reconstructions and MIPs were obtained to evaluate the vascular anatomy. Carotid stenosis measurements (when applicable) are obtained utilizing NASCET criteria, using the distal internal carotid diameter as the denominator. CONTRAST:  76mL OMNIPAQUE IOHEXOL 350 MG/ML SOLN COMPARISON:  None. FINDINGS: CTA NECK FINDINGS Aortic arch: Great vessel origins are patent. Right carotid system: Common, internal, and external carotid arteries are patent. There is minor plaque without measurable stenosis at the ICA origin. Left carotid system: Common, internal, and external carotid arteries are patent. There is minor plaque without measurable stenosis at the ICA origin. Vertebral arteries: Patent. Right vertebral artery is dominant. There is focal moderate stenosis of the proximal right vertebral artery just beyond the origin. Skeleton: Degenerative changes are present, greatest at C6-C7. Other neck: No neck mass or adenopathy. Upper chest: Bullous changes at the lung apices. Paraseptal and centrilobular emphysema. Review of the MIP images confirms the above findings CTA HEAD FINDINGS Anterior circulation: Intracranial internal carotid arteries are patent with mild calcified plaque. There is a 2 x 1 mm laterally directed outpouching from the paraclinoid left ICA. Anterior and middle cerebral arteries are patent. Posterior circulation: Intracranial  right vertebral artery is patent with mild calcified and noncalcified plaque. Left vertebral artery terminates as a PICA. Basilar artery is patent. Posterior cerebral arteries are patent. Right larger than left posterior communicating arteries are present. Venous sinuses: As permitted by contrast timing, patent. Anatomic variants: Fetal origin of the right posterior cerebral artery. Review of the MIP images confirms the above findings IMPRESSION: No large vessel occlusion or hemodynamically  significant stenosis. Small aneurysm of the paraclinoid left ICA. Electronically Signed   By: Macy Mis M.D.   On: 11/21/2019 10:06   EP PPM/ICD IMPLANT  Result Date: 11/22/2019 SURGEON:  Thompson Grayer, MD   PREPROCEDURE DIAGNOSIS:  Cryptogenic Stroke   POSTPROCEDURE DIAGNOSIS:  Cryptogenic Stroke    PROCEDURES:  1. Implantable loop recorder implantation   INTRODUCTION:  Darren Pearson is a 53 y.o. male with a history of unexplained stroke who presents today for implantable loop implantation.  The patient has had a cryptogenic stroke.  Despite an extensive workup by neurology, no reversible causes have been identified.  he has worn telemetry during which he did not have arrhythmias.  There is significant concern for possible atrial fibrillation as the cause for the patients stroke.  The patient therefore presents today for implantable loop implantation.   DESCRIPTION OF PROCEDURE:  Informed written consent was obtained.  The patient required no sedation for the procedure today.  The patients left chest was prepped and draped. Mapping over the patient's chest was performed to identify the appropriate ILR site.  This area was found to be the left parasternal region over the 3rd-4th intercostal space.  The skin overlying this region was infiltrated with lidocaine for local analgesia.  A 0.5-cm incision was made at the implant site.  A subcutaneous ILR pocket was fashioned using a combination of sharp and blunt dissection.  A Medtronic Reveal Linq model [id number] implantable loop recorder was then placed into the pocket R waves were very prominent and measured > 0.2 mV. EBL<1 ml.  Steri- Strips and a sterile dressing were then applied.  There were no early apparent complications.   CONCLUSIONS:  1. Successful implantation of a Medtronic Reveal LINQ implantable loop recorder for cryptogenic stroke  2. No early apparent complications. Thompson Grayer MD, [facility] 11/22/2019 3:06 PM   ECHOCARDIOGRAM COMPLETE  Result  Date: 11/21/2019   ECHOCARDIOGRAM REPORT   Patient Name:   Darren Pearson Date of Exam: 11/21/2019 Medical Rec #:  [id number]       Height:       68.0 in Accession #:    [id number]      Weight:       160.0 lb Date of Birth:  Jun 08, 1966        BSA:          1.86 m Patient Age:    47 years        BP:           173/109 mmHg Patient Gender: M               HR:           79 bpm. Exam Location:  Inpatient Procedure: 2D Echo, Cardiac Doppler and Color Doppler Indications:    Stroke  History:        Patient has no prior history of Echocardiogram examinations.                 Risk Factors:Hypertension.  Sonographer:    Roseanna Rainbow RDCS Referring Phys: [id number] [facility]  M PATEL  Sonographer Comments: Image acquisition challenging due to respiratory motion. IMPRESSIONS  1. Left ventricular ejection fraction, by visual estimation, is 45 to 50%. The left ventricle has low normal function. There is moderately increased left ventricular hypertrophy.  2. Left ventricular diastolic parameters are consistent with Grade I diastolic dysfunction (impaired relaxation).  3. The left ventricle demonstrates global hypokinesis.  4. Global right ventricle has mildly reduced systolic function.The right ventricular size is normal. No increase in right ventricular wall thickness.  5. Left atrial size was normal.  6. Right atrial size was normal.  7. The mitral valve is grossly normal. Trivial mitral valve regurgitation.  8. The tricuspid valve is normal in structure.  9. The aortic valve is tricuspid. Aortic valve regurgitation is not visualized. Mild aortic valve sclerosis without stenosis. 10. The pulmonic valve was grossly normal. Pulmonic valve regurgitation is not visualized. 11. The inferior vena cava is normal in size with greater than 50% respiratory variability, suggesting right atrial pressure of 3 mmHg. 12. The interatrial septum was not well visualized. FINDINGS  Left Ventricle: Left ventricular ejection fraction, by visual estimation, is  45 to 50%. The left ventricle has low normal function. The left ventricle demonstrates global hypokinesis. There is moderately increased left ventricular hypertrophy. Left ventricular diastolic parameters are consistent with Grade I diastolic dysfunction (impaired relaxation). Indeterminate filling pressures. Right Ventricle: The right ventricular size is normal. No increase in right ventricular wall thickness. Global RV systolic function is has mildly reduced systolic function. Left Atrium: Left atrial size was normal in size. Right Atrium: Right atrial size was normal in size Pericardium: There is no evidence of pericardial effusion. Mitral Valve: The mitral valve is grossly normal. Trivial mitral valve regurgitation. Tricuspid Valve: The tricuspid valve is normal in structure. Tricuspid valve regurgitation is not demonstrated. Aortic Valve: The aortic valve is tricuspid. Aortic valve regurgitation is not visualized. Mild aortic valve sclerosis is present, with no evidence of aortic valve stenosis. Pulmonic Valve: The pulmonic valve was grossly normal. Pulmonic valve regurgitation is not visualized. Pulmonic regurgitation is not visualized. Aorta: The aortic root and ascending aorta are structurally normal, with no evidence of dilitation. Venous: The inferior vena cava is normal in size with greater than 50% respiratory variability, suggesting right atrial pressure of 3 mmHg. IAS/Shunts: The interatrial septum was not well visualized.  LEFT VENTRICLE PLAX 2D LVIDd:         4.30 cm       Diastology LVIDs:         3.20 cm       LV e' lateral:   9.03 cm/s LV PW:         1.50 cm       LV E/e' lateral: 8.3 LV IVS:        1.30 cm       LV e' medial:    6.31 cm/s LVOT diam:     2.10 cm       LV E/e' medial:  11.9 LV SV:         42 ml LV SV Index:   22.48 LVOT Area:     3.46 cm  LV Volumes (MOD) LV area d, A2C:    30.70 cm LV area d, A4C:    28.70 cm LV area s, A2C:    20.70 cm LV area s, A4C:    19.30 cm LV major d,  A2C:   8.27 cm LV major d, A4C:   8.49 cm LV major  s, A2C:   7.52 cm LV major s, A4C:   7.19 cm LV vol d, MOD A2C: 95.5 ml LV vol d, MOD A4C: 80.0 ml LV vol s, MOD A2C: 48.8 ml LV vol s, MOD A4C: 43.0 ml LV SV MOD A2C:     46.7 ml LV SV MOD A4C:     80.0 ml LV SV MOD BP:      41.5 ml RIGHT VENTRICLE            IVC RV S prime:     9.44 cm/s  IVC diam: 1.40 cm TAPSE (M-mode): 1.9 cm LEFT ATRIUM             Index       RIGHT ATRIUM           Index LA diam:        2.00 cm 1.08 cm/m  RA Area:     10.90 cm LA Vol (A2C):   33.1 ml 17.81 ml/m RA Volume:   22.70 ml  12.21 ml/m LA Vol (A4C):   27.7 ml 14.90 ml/m LA Biplane Vol: 32.9 ml 17.70 ml/m  AORTIC VALVE LVOT Vmax:   83.60 cm/s LVOT Vmean:  56.400 cm/s LVOT VTI:    0.130 m  AORTA Ao Root diam: 3.50 cm Ao Asc diam:  3.20 cm MITRAL VALVE MV Area (PHT): 3.31 cm             SHUNTS MV PHT:        66.41 msec           Systemic VTI:  0.13 m MV Decel Time: 229 msec             Systemic Diam: 2.10 cm MV E velocity: 75.00 cm/s 103 cm/s MV A velocity: 80.60 cm/s 70.3 cm/s MV E/A ratio:  0.93       1.5  Lyman Bishop MD Electronically signed by Lyman Bishop MD Signature Date/Time: 11/21/2019/3:20:41 PM    Final    ECHO TEE  Result Date: 11/22/2019   TRANSESOPHOGEAL ECHO REPORT   Patient Name:   Darren Pearson Date of Exam: 11/22/2019 Medical Rec #:  [id number]       Height:       68.0 in Accession #:    [id number]      Weight:       160.0 lb Date of Birth:  May 09, 1966        BSA:          1.86 m Patient Age:    15 years        BP:           230/119 mmHg Patient Gender: M               HR:           72 bpm. Exam Location:  Inpatient  Procedure: Transesophageal Echo, Cardiac Doppler and Saline Contrast Bubble            Study Indications:     Stroke  History:         Patient has prior history of Echocardiogram examinations, most                  recent 11/21/2019.  Sonographer:     Roseanna Rainbow RDCS Referring Phys:  [id number] Abigail Butts Diagnosing Phys: [name]  MD  PROCEDURE: Normal Transesophogeal exam. Consent was requested emergently by emergency room physicain. Patients was under conscious sedation during this procedure.  Anesthetic was administered intravenously by performing Physician: 73mcg of Fentanyl, 6mg  of Versed. The transesophogeal probe was passed through the esophogus of the patient. Imaged were obtained with the patient in a left lateral decubitus position. The patient's vital signs; including heart rate, blood pressure, and oxygen saturation; remained stable throughout the procedure. The patient developed no complications during the procedure. IMPRESSIONS  1. Left ventricular ejection fraction, by visual estimation, is 45 to 50%. The left ventricle has mildly decreased function. There is moderately increased left ventricular hypertrophy.  2. The left ventricle has no regional wall motion abnormalities.  3. Global right ventricle has normal systolic function.The right ventricular size is normal. No increase in right ventricular wall thickness.  4. Left atrial size was normal.  5. Right atrial size was normal.  6. The mitral valve is normal in structure. Mild mitral valve regurgitation. No evidence of mitral stenosis.  7. The tricuspid valve is normal in structure.  8. The aortic valve is normal in structure. Aortic valve regurgitation is not visualized. No evidence of aortic valve sclerosis or stenosis.  9. The pulmonic valve was normal in structure. Pulmonic valve regurgitation is not visualized. 10. The inferior vena cava is normal in size with greater than 50% respiratory variability, suggesting right atrial pressure of 3 mmHg. 11. No intracardiac source of embolism. Negative bubble study. FINDINGS  Left Ventricle: Left ventricular ejection fraction, by visual estimation, is 45 to 50%. The left ventricle has mildly decreased function. The left ventricle has no regional wall motion abnormalities. There is moderately increased left ventricular hypertrophy.  Concentric left ventricular hypertrophy. Normal left atrial pressure. Right Ventricle: The right ventricular size is normal. No increase in right ventricular wall thickness. Global RV systolic function is has normal systolic function. Left Atrium: Left atrial size was normal in size. Right Atrium: Right atrial size was normal in size Pericardium: There is no evidence of pericardial effusion. Mitral Valve: The mitral valve is normal in structure. No evidence of mitral valve stenosis by observation. Mild mitral valve regurgitation. Tricuspid Valve: The tricuspid valve is normal in structure. Tricuspid valve regurgitation is mild. Aortic Valve: The aortic valve is normal in structure. Aortic valve regurgitation is not visualized. The aortic valve is structurally normal, with no evidence of sclerosis or stenosis. Pulmonic Valve: The pulmonic valve was normal in structure. Pulmonic valve regurgitation is not visualized. Aorta: The aortic root, ascending aorta and aortic arch are all structurally normal, with no evidence of dilitation or obstruction. Venous: The inferior vena cava is normal in size with greater than 50% respiratory variability, suggesting right atrial pressure of 3 mmHg. Shunts: Agitated saline contrast was given intravenously to evaluate for intracardiac shunting. Saline contrast bubble study was negative, with no evidence of any interatrial shunt. There is no evidence of a patent foramen ovale. No ventricular septal defect is seen or detected. There is no evidence of an atrial septal defect. No atrial level shunt detected by color flow Doppler.  [name] MD Electronically signed by [name] MD Signature Date/Time: 11/22/2019/12:50:07 PM    Final    VAS [place] LOWER EXTREMITY VENOUS (DVT)  Result Date: 11/22/2019  Lower Venous Study Indications: Stroke.  Comparison Study: No prior study on file Performing Technologist: Sharion Dove RVS  Examination Guidelines: A complete evaluation  includes B-mode imaging, spectral Doppler, color Doppler, and power Doppler as needed of all accessible portions of each vessel. Bilateral testing is considered an integral part of a complete examination. Limited examinations for reoccurring indications  may be performed as noted.  +---------+---------------+---------+-----------+----------+--------------+ RIGHT    CompressibilityPhasicitySpontaneityPropertiesThrombus Aging +---------+---------------+---------+-----------+----------+--------------+ CFV      Full           Yes      Yes                                 +---------+---------------+---------+-----------+----------+--------------+ SFJ      Full                                                        +---------+---------------+---------+-----------+----------+--------------+ FV Prox  Full                                                        +---------+---------------+---------+-----------+----------+--------------+ FV Mid   Full                                                        +---------+---------------+---------+-----------+----------+--------------+ FV DistalFull                                                        +---------+---------------+---------+-----------+----------+--------------+ PFV      Full                                                        +---------+---------------+---------+-----------+----------+--------------+ POP      Full           Yes      Yes                                 +---------+---------------+---------+-----------+----------+--------------+ PTV      Partial                                                     +---------+---------------+---------+-----------+----------+--------------+ PERO     Full                                                        +---------+---------------+---------+-----------+----------+--------------+    +---------+---------------+---------+-----------+----------+--------------+ LEFT     CompressibilityPhasicitySpontaneityPropertiesThrombus Aging +---------+---------------+---------+-----------+----------+--------------+ CFV      Full           Yes      Yes                                 +---------+---------------+---------+-----------+----------+--------------+  SFJ      Full                                                        +---------+---------------+---------+-----------+----------+--------------+ FV Prox  Full                                                        +---------+---------------+---------+-----------+----------+--------------+ FV Mid   Full                                                        +---------+---------------+---------+-----------+----------+--------------+ FV DistalFull                                                        +---------+---------------+---------+-----------+----------+--------------+ PFV      Full                                                        +---------+---------------+---------+-----------+----------+--------------+ POP      Full           Yes      Yes                                 +---------+---------------+---------+-----------+----------+--------------+ PTV      Full                                                        +---------+---------------+---------+-----------+----------+--------------+ PERO     Full                                                        +---------+---------------+---------+-----------+----------+--------------+     Summary: Right: There is no evidence of deep vein thrombosis in the lower extremity. Left: There is no evidence of deep vein thrombosis in the lower extremity.  *See table(s) above for measurements and observations. Electronically signed by Ruta Hinds MD on 11/22/2019 at 1:46:38 PM.    Final         Scheduled Meds: .  stroke: mapping  our early stages of recovery book   Does not apply Once  . amLODipine  5 mg Oral Daily  . aspirin EC  81 mg Oral Daily  . atorvastatin  40 mg Oral q1800  . clopidogrel  75 mg Oral  Daily  . enoxaparin (LOVENOX) injection  40 mg Subcutaneous Q24H  . nicotine  14 mg Transdermal Daily  . vitamin B-12  1,000 mcg Oral Daily   Continuous Infusions:   LOS: 2 days     Cordelia Poche, MD [facility] 11/22/2019, 4:19 PM  If 7PM-7AM, please contact night-coverage [email or web address]

## 2019-11-23 LAB — ANTINUCLEAR ANTIBODIES, IFA: ANA Ab, IFA: NEGATIVE

## 2019-11-23 MED ORDER — LISINOPRIL 20 MG PO TABS: 20.0000 mg | ORAL_TABLET | Freq: Every day | ORAL | 0 refills | Status: DC

## 2019-11-23 MED ORDER — ATORVASTATIN CALCIUM 40 MG PO TABS: 40.0000 mg | ORAL_TABLET | Freq: Every day | ORAL | 0 refills | Status: DC

## 2019-11-23 MED ORDER — CYANOCOBALAMIN 1000 MCG PO TABS: 1000.0000 ug | ORAL_TABLET | Freq: Every day | ORAL | 0 refills | Status: DC

## 2019-11-23 MED ORDER — ATORVASTATIN CALCIUM 40 MG PO TABS: 40.0000 mg | ORAL_TABLET | Freq: Every day | ORAL | 0 refills | Status: AC

## 2019-11-23 MED ORDER — ASPIRIN 81 MG PO TBEC: 81.0000 mg | DELAYED_RELEASE_TABLET | Freq: Every day | ORAL | 0 refills | Status: DC

## 2019-11-23 MED ORDER — CLOPIDOGREL BISULFATE 75 MG PO TABS: 75.0000 mg | ORAL_TABLET | Freq: Every day | ORAL | 0 refills | Status: DC

## 2019-11-23 MED ORDER — CLOPIDOGREL BISULFATE 75 MG PO TABS: 75.0000 mg | ORAL_TABLET | Freq: Every day | ORAL | 0 refills | Status: AC

## 2019-11-23 MED ORDER — ASPIRIN 81 MG PO TBEC: 81.0000 mg | DELAYED_RELEASE_TABLET | Freq: Every day | ORAL | 0 refills | Status: AC

## 2019-11-23 MED ORDER — CYANOCOBALAMIN 1000 MCG PO TABS: 1000.0000 ug | ORAL_TABLET | Freq: Every day | ORAL | 0 refills | Status: AC

## 2019-11-23 MED FILL — ASPIRIN LOW DOSE 81 MG TBEC: 81 | 90 days supply | Qty: 90 | Fill #0

## 2019-11-23 MED FILL — CLOPIDOGREL 75 MG TABLET: 75 | 21 days supply | Qty: 21 | Fill #0

## 2019-11-23 MED FILL — LISINOPRIL 20 MG TABLET: 20 | 30 days supply | Qty: 30 | Fill #0

## 2019-11-23 MED FILL — ATORVASTATIN CALCIUM 40 MG: 40 | 30 days supply | Qty: 30 | Fill #0

## 2019-11-23 MED FILL — VITAMIN B-12 1000 MCG TABS: 1000 | 90 days supply | Qty: 90 | Fill #0

## 2019-11-23 NOTE — Progress Notes (Signed)
Physical Therapy Treatment Patient Details Name: Darren Pearson MRN: [id number] DOB: 1966-09-12 Today's Date: 11/23/2019    History of Present Illness Pt is 53 yo male with hep C and HTN who presents with over a week of poor balance and at least 3 falls. MRI brain revealed acute/subacute punctate bilateral cortical infarctions involving the right postcentral gyrus and left corona radiata, as well as an equivocal focus of restricted diffusion in the medial right pons suggestive of acute infarction vs T2-shine through artifact. Also noted are remote hemorrhagic lacunar infarcts of the right caudate head and right cerebellum. Chronic small vessel ischemic changes and mild atrophy also noted. S/p TEE with loop recorder placement 12/29.    PT Comments    Pt demonstrating progressive improvement, requiring less assist with mobility and decreased LLE ataxia noted. Pt ambulating 350 feet with a cane at a min guard assist level. Negotiated 4 steps with instructions provided for ascending/descending with use of assistive device. Pt continues to present as a high fall risk based on decreased gait speed and history of falls. Will benefit from HHPT at discharge.     Follow Up Recommendations  Home health PT     Equipment Recommendations  Cane    Recommendations for Other Services       Precautions / Restrictions Precautions Precautions: Fall Restrictions Weight Bearing Restrictions: No    Mobility  Bed Mobility               General bed mobility comments: OOB in chair  Transfers Overall transfer level: Needs assistance Equipment used: Straight cane Transfers: Sit to/from Stand Sit to Stand: Supervision            Ambulation/Gait Ambulation/Gait assistance: Min guard Gait Distance (Feet): 350 Feet Assistive device: Straight cane Gait Pattern/deviations: Step-through pattern;Decreased stride length;Decreased dorsiflexion - left Gait velocity: decreased   General Gait  Details: Cues for proper use of cane. Min guard for dynamic stability, no overt LOB. Cues for picking feet up on turns, otherwise, demonstrates adequate foot clearance   Stairs Stairs: Yes Stairs assistance: Min guard Stair Management: One rail Left;With cane Number of Stairs: 4 General stair comments: Cues for step by step pattern, use of cane, sequencing.   Wheelchair Mobility    Modified Rankin (Stroke Patients Only) Modified Rankin (Stroke Patients Only) Pre-Morbid Rankin Score: No significant disability Modified Rankin: Moderately severe disability     Balance Overall balance assessment: Needs assistance Sitting-balance support: Feet supported Sitting balance-Leahy Scale: Good     Standing balance support: Single extremity supported;During functional activity Standing balance-Leahy Scale: Fair                              Cognition Arousal/Alertness: Awake/alert Behavior During Therapy: WFL for tasks assessed/performed Overall Cognitive Status: Within Functional Limits for tasks assessed                                 General Comments: No apparent cognitive deficits with mobility tasks; min cues for navigation/wayfinding in hallway.      Exercises      General Comments        Pertinent Vitals/Pain Pain Assessment: Faces Faces Pain Scale: No hurt    Home Living                      Prior Function  PT Goals (current goals can now be found in the care plan section) Acute Rehab PT Goals Patient Stated Goal: return home and get back to work Potential to Achieve Goals: Good Progress towards PT goals: Progressing toward goals    Frequency    Min 4X/week      PT Plan Current plan remains appropriate    Co-evaluation              AM-PAC PT "6 Clicks" Mobility   Outcome Measure  Help needed turning from your back to your side while in a flat bed without using bedrails?: None Help needed moving  from lying on your back to sitting on the side of a flat bed without using bedrails?: None Help needed moving to and from a bed to a chair (including a wheelchair)?: A Little Help needed standing up from a chair using your arms (e.g., wheelchair or bedside chair)?: None Help needed to walk in hospital room?: A Little Help needed climbing 3-5 steps with a railing? : A Little 6 Click Score: 21    End of Session Equipment Utilized During Treatment: Gait belt Activity Tolerance: Patient tolerated treatment well Patient left: Other (comment)(OT handoff in room) Nurse Communication: Mobility status PT Visit Diagnosis: Unsteadiness on feet (R26.81);Repeated falls (R29.6)     Time: [id number] PT Time Calculation (min) (ACUTE ONLY): 17 min  Charges:  $Gait Training: 8-22 mins                     Ellamae Sia, [place], DPT Acute Rehabilitation Services Pager [phone] Office [phone]    Willy Eddy 11/23/2019, 10:14 AM

## 2019-11-23 NOTE — Progress Notes (Signed)
Pt given discharge summary and discharged via daughter as transportation.

## 2019-11-23 NOTE — Progress Notes (Signed)
Occupational Therapy Treatment Patient Details Name: Darren Pearson MRN: [id number] DOB: 1966-03-24 Today's Date: 11/23/2019    History of present illness Pt is 53 yo male with hep C and HTN who presents with over a week of poor balance and at least 3 falls. MRI brain revealed acute/subacute punctate bilateral cortical infarctions involving the right postcentral gyrus and left corona radiata, as well as an equivocal focus of restricted diffusion in the medial right pons suggestive of acute infarction vs T2-shine through artifact. Also noted are remote hemorrhagic lacunar infarcts of the right caudate head and right cerebellum. Chronic small vessel ischemic changes and mild atrophy also noted. S/p TEE with loop recorder placement 12/29.   OT comments  Pt progressing toward established OT goals. Pt's acute changes noted during yesterday's session resolved. Pt sitting on commode upon OT arrival, pt required supervision-minguard for ADL and functional mobility at spc level. Pt scored a 1/5 the medication transfer screen to assess cognition skills necessary for medication management. Pt demonstrates cognitive limitations and physical limitations impacting safety and independence with ADL and functional mobility. Pt will continue to benefit from skilled OT services to maximize safety and independence with ADL/IADL and functional mobility. Feel pt is appropriate to discharge home at current level of functioning when medically appropriate, all additional OT needs to be addressed at next venue. OT will sign off at this time.    Follow Up Recommendations  Home health OT;Supervision - Intermittent(with medication management)    Equipment Recommendations  3 in 1 bedside commode    Recommendations for Other Services      Precautions / Restrictions Precautions Precautions: Fall Precaution Comments: 3 falls at home before coming in Restrictions Weight Bearing Restrictions: No       Mobility Bed  Mobility               General bed mobility comments: OOB upon arrival  Transfers Overall transfer level: Needs assistance Equipment used: Straight cane Transfers: Sit to/from Stand Sit to Stand: Supervision              Balance Overall balance assessment: Needs assistance Sitting-balance support: Feet supported Sitting balance-Leahy Scale: Good     Standing balance support: Single extremity supported;During functional activity Standing balance-Leahy Scale: Fair Standing balance comment: reliant on spc                           ADL either performed or assessed with clinical judgement   ADL Overall ADL's : Needs assistance/impaired                         Toilet Transfer: Ambulation;Min guard(spc) Toilet Transfer Details (indicate cue type and reason): supervision for safety  Toileting- Clothing Manipulation and Hygiene: Supervision/safety;Sit to/from stand       Functional mobility during ADLs: Min guard(spc) General ADL Comments: pt demonstrated improvement with functional mobiltiy this session     Vision       Perception     Praxis      Cognition Arousal/Alertness: Awake/alert Behavior During Therapy: WFL for tasks assessed/performed Overall Cognitive Status: Impaired/Different from baseline Area of Impairment: Problem solving                             Problem Solving: Slow processing;Difficulty sequencing;Requires verbal cues;Requires tactile cues General Comments: pt completed the medication transfer screen to assess memory and executive  functioning skills required to correctly transfer medication into pill box;pt scored a 1/5 indicating difficulty with organization skills and problem solving;discussed having sister assist pt with medication management to ensure proper adherence, pt verbalized understanding        Exercises     Shoulder Instructions       General Comments vss    Pertinent Vitals/  Pain       Pain Assessment: Faces Faces Pain Scale: No hurt  Home Living                                          Prior Functioning/Environment              Frequency  Min 2X/week        Progress Toward Goals  OT Goals(current goals can now be found in the care plan section)  Progress towards OT goals: Progressing toward goals(Pt's current level adequate for discharge)  Acute Rehab OT Goals Patient Stated Goal: return home today OT Goal Formulation: With patient Time For Goal Achievement: 12/05/19 Potential to Achieve Goals: Good ADL Goals Pt Will Perform Grooming: with modified independence Pt Will Perform Upper Body Dressing: with modified independence Pt Will Perform Lower Body Dressing: with modified independence;sit to/from stand Pt Will Transfer to Toilet: with modified independence;ambulating Additional ADL Goal #1: Pt will demonstrate independence with medication managment.  Plan Discharge plan remains appropriate    Co-evaluation                 AM-PAC OT "6 Clicks" Daily Activity     Outcome Measure   Help from another person eating meals?: None Help from another person taking care of personal grooming?: A Little Help from another person toileting, which includes using toliet, bedpan, or urinal?: A Little Help from another person bathing (including washing, rinsing, drying)?: A Little Help from another person to put on and taking off regular upper body clothing?: A Little Help from another person to put on and taking off regular lower body clothing?: A Little 6 Click Score: 19    End of Session Equipment Utilized During Treatment: Gait belt(spc)  OT Visit Diagnosis: Unsteadiness on feet (R26.81);Other abnormalities of gait and mobility (R26.89);Muscle weakness (generalized) (M62.81);History of falling (Z91.81)   Activity Tolerance Patient tolerated treatment well   Patient Left in chair;with call bell/phone within reach;with  chair alarm set   Nurse Communication Mobility status        Time: 1005-1026 OT Time Calculation (min): 21 min  Charges: OT General Charges $OT Visit: 1 Visit OT Treatments $Self Care/Home Management : 8-22 mins  Dorinda Hill OTR/L [place] Office: [employer] 11/23/2019, 10:41 AM

## 2019-11-23 NOTE — TOC Transition Note (Signed)
Transition of Care [facility]) - CM/SW Discharge Note   Patient Details  Name: Darren Pearson MRN: [id number] Date of Birth: Jun 13, 1966  [facility] CM/SW Contact:  Pollie Friar, RN Phone Number: 11/23/2019, 12:26 PM   Clinical Narrative:    Pt set up with [place] through [facility]. DME at the bedside. Pt's discharge meds to be delivered to the room. CM assisted with the cost via [place].  Pt states he has transport home.    Final next level of care: [place] Barriers to Discharge: Inadequate or no insurance, Barriers Unresolved (comment)   Patient Goals and CMS Choice     Choice offered to / list presented to : Patient  Discharge Placement                       Discharge Plan and Services   Discharge Planning Services: CM Consult Post Acute Care Choice: Home Health          DME Arranged: Kasandra Knudsen DME Agency: [employer] Date DME Agency Contacted: 11/22/19   Representative spoke with at DME Agency: [place]: PT [place]: [facility] [place] Date [facility] Agency Contacted: 11/22/19   Representative spoke with at [place]: [place] ([place]) Interventions     Readmission Risk Interventions No flowsheet data found.

## 2019-11-24 LAB — PROTHROMBIN GENE MUTATION

## 2019-11-29 ENCOUNTER — Telehealth: Payer: Self-pay

## 2019-11-29 NOTE — Telephone Encounter (Signed)
New message   Patient states that his loop recorder is cutting on and off. Please call the patient.

## 2019-11-29 NOTE — Telephone Encounter (Signed)
I let the pt know the green light will not constantly stay on. I told him his monitor is updating and sending transmission like it supposed too. I told him all he has to do is sleep near the monitor and the monitor will do the job it needs to do. I told him he will not be able to send manual transmission because his monitor is set up to send automatically as long as it is plugged up and he sleeps near it. The pt asked was there someone here to help him fill out medicaid paperwork so he can get that started. I told him I can have the billing department give him a call. I told him I do not know if anyone will help fill out medicaid/ medicare paperwork. He asked me will the hospital help? I told him I do not know. He asked me about the appointment he have tomorrow with someone on [address]. I told him I saw a my chart appointment but I do not know what the appointment is for since it is with a different office. I told the only appointment I saw with our office was his wound check appointment on 12-06-2019. I told him it is an in office appointment. I gave him the address and time of appointment with [place].   I told the pt I will let the billing department know he would like to speak with them but I do not know if they can help him with the medicaid/medicare issue.

## 2019-11-30 ENCOUNTER — Telehealth (INDEPENDENT_AMBULATORY_CARE_PROVIDER_SITE_OTHER): Payer: Self-pay | Admitting: Primary Care

## 2019-11-30 ENCOUNTER — Encounter (INDEPENDENT_AMBULATORY_CARE_PROVIDER_SITE_OTHER): Payer: Self-pay

## 2019-12-01 ENCOUNTER — Ambulatory Visit (INDEPENDENT_AMBULATORY_CARE_PROVIDER_SITE_OTHER): Payer: Self-pay | Admitting: Primary Care

## 2019-12-01 ENCOUNTER — Other Ambulatory Visit: Payer: Self-pay

## 2019-12-01 ENCOUNTER — Encounter (INDEPENDENT_AMBULATORY_CARE_PROVIDER_SITE_OTHER): Payer: Self-pay | Admitting: Primary Care

## 2019-12-01 DIAGNOSIS — Z09 Encounter for follow-up examination after completed treatment for conditions other than malignant neoplasm: Secondary | ICD-10-CM

## 2019-12-01 DIAGNOSIS — Z7689 Persons encountering health services in other specified circumstances: Secondary | ICD-10-CM

## 2019-12-01 DIAGNOSIS — I639 Cerebral infarction, unspecified: Secondary | ICD-10-CM

## 2019-12-01 NOTE — Progress Notes (Signed)
Virtual Visit via Telephone Note  I connected with Darren Pearson on 12/01/19 at 10:10 AM EST by telephone and verified that I am speaking with the correct person using two identifiers.   I discussed the limitations, risks, security and privacy concerns of performing an evaluation and management service by telephone and the availability of in person appointments. I also discussed with the patient that there may be a patient responsible charge related to this service. The patient expressed understanding and agreed to proceed.   History of Present Illness: Darren Pearson is having a tele visit to establish care and hospital follow up. 11/20/2019 he presented to the emergency room for  a fall. He reports that he has been having balance issues for last 10 days and has fallen multiple times.  Sometimes his legs would give away.Work-up showed that the patient had a subacute stroke. Past Medical History:  Diagnosis Date  . Allergy   . Anxiety and depression   . Asthma   . Hepatitis C   . History of chicken pox   . Hypertension    Current Outpatient Medications on File Prior to Visit  Medication Sig Dispense Refill  . acetaminophen (TYLENOL) 325 MG tablet Take 650 mg by mouth every 6 (six) hours as needed for mild pain or headache.    Marland Kitchen aspirin 81 MG EC tablet Take 1 tablet (81 mg total) by mouth daily. 90 tablet 0  . atorvastatin (LIPITOR) 40 MG tablet Take 1 tablet (40 mg total) by mouth daily at 6 PM. 90 tablet 0  . clopidogrel (PLAVIX) 75 MG tablet Take 1 tablet (75 mg total) by mouth daily for 21 days. 21 tablet 0  . cyanocobalamin 1000 MCG tablet Take 1 tablet (1,000 mcg total) by mouth daily. 90 tablet 0  . lisinopril (ZESTRIL) 20 MG tablet Take 1 tablet (20 mg total) by mouth daily. 90 tablet 0  . polyvinyl alcohol (LIQUIFILM TEARS) 1.4 % ophthalmic solution Place 1 drop into both eyes as needed for dry eyes.     No current facility-administered medications on file prior to visit.    Observations/Objective: Review of Systems  All other systems reviewed and are negative.   Assessment and Plan: [name] was seen today for hospitalization follow-up.  Diagnoses and all orders for this visit:  Encounter to establish care [name], NP-C will be your  (PCP) mastered prepared that is able to that will  diagnosed and treatment able to answer health concern as well as continuing care of varied medical conditions, not limited by cause, organ system, or diagnosis.   Cerebrovascular accident (CVA), unspecified mechanism ([place]) MRI significant for acute/subacute bilateral cortical infarction of left postcentral gyrus/corona radiata in addition to punctate cortical infarct of the right parietal lobe; also noted to have remote infarcts and evidence of likely chronic microvascular ischemia. Followed by  [facility] Currently on Plavix 75mg  for 3 weeks than aspirin.   Hospital discharge follow-up Discharge instruction were to establish PCP appointment given at discharge, medications will be filled at [facility] and follow up with [facility]. Discharge with a cane unclear if patient would benefit from physical therapy defer to [facility].    Follow Up Instructions:    I discussed the assessment and treatment plan with the patient. The patient was provided an opportunity to ask questions and all were answered. The patient agreed with the plan and demonstrated an understanding of the instructions.   The patient was advised to call back or seek an  in-person evaluation if the symptoms worsen or if the condition fails to improve as anticipated.  I provided 15 minutes of non-face-to-face time during this encounter.   [name], NP

## 2019-12-02 NOTE — Telephone Encounter (Signed)
I spoke with the pt and gave him the number to [facility] to get additional help.

## 2019-12-06 ENCOUNTER — Other Ambulatory Visit: Payer: Self-pay

## 2019-12-06 ENCOUNTER — Telehealth: Payer: Self-pay | Admitting: Emergency Medicine

## 2019-12-06 ENCOUNTER — Ambulatory Visit (INDEPENDENT_AMBULATORY_CARE_PROVIDER_SITE_OTHER): Payer: Self-pay | Admitting: *Deleted

## 2019-12-06 DIAGNOSIS — I639 Cerebral infarction, unspecified: Secondary | ICD-10-CM

## 2019-12-06 LAB — CUP PACEART INCLINIC DEVICE CHECK
Date Time Interrogation Session: 20210112124204
Implantable Pulse Generator Implant Date: 20201229

## 2019-12-06 NOTE — Patient Instructions (Addendum)
Call office if you have drainage , swelling or redness at incision site.

## 2019-12-06 NOTE — Progress Notes (Signed)
ILR wound check in clinic. Steri strips removed. Wound well healed. R -waves 0.53. Home monitor transmitting nightly. No episodes. Questions answered.

## 2019-12-09 ENCOUNTER — Other Ambulatory Visit: Payer: Self-pay | Admitting: *Deleted

## 2019-12-09 ENCOUNTER — Encounter: Payer: Self-pay | Admitting: *Deleted

## 2019-12-09 NOTE — Patient Outreach (Addendum)
[facility]) Care Management  12/09/2019  Darren Pearson 02-25-1966 [id number]   Subjective: Telephone call to patient's home / mobile number, spoke with patient, and HIPAA verified.  Discussed [facility] Care Management Medicaid EMMI Stroke Red Flag Alert  follow up, patient voiced understanding, and is in agreement to follow up.  Patient states he is doing fine, having some issues with memory since the stroke, sister assisted with address verification today, and is planning to discuss with MD during next follow up appointment.  States he does not remember receiving EMMI automated calls.   States he had a follow up appointment with primary MD on 12/01/2019 and appointment went well.  States he has received and completed home health physical therapy services, services went well.   States he has a follow up appointment with neurologist on 12/28/2019.  Patient states he is able to manage self care, lives with sister Darren Pearson),  and has assistance as needed.  Patient voices understanding of medical diagnosis and treatment plan.  Patient states he is aware of signs/ symptoms to report, how to reach provider if needed after hours, when to go to ED, and / or call 911.  States he is accessing his Medicaid benefits as needed via member services number on back of card.  Discussed Advanced Directives, advised of [place] Management Social Worker  Advanced Directives document completion benefit, patient voices understanding, and in agreement to a referral to Education officer, museum will to ask Social Worker to send [employer], and follow up on document completion.  Patient states he does not have any education material, EMMI follow up, care coordination, care management, disease monitoring, transportation, community resource, or pharmacy needs at this time.  States he is very appreciative of the follow up, is in agreement to receive 1 additional follow up call to assess for further CM needs, and  to  receive [place] Management EMMI follow up calls as needed. RNCM verbally gave patient RNCM;s  contact number and patient will call if assistance needed prior to next patent outreach.       Objective: Per KPN (Knowledge Performance Now, point of care tool) and chart review, patient hospitalized 11/20/2019 -  11/23/2019 for acute CVA, status post multiple falls.   Patient also has a history of hypertension, hyperlipidemia, Hepatitis C, Asthma, and ICA aneurysm.       Assessment: Received Medicaid EMMI Stroke Red Flag Alert follow up referral on 12/08/2019.  Red Flag Alert Triggers, Day #13, times 2, patient answered no to the following questions: Went to follow-up appointment?   Scheduled a follow-up appointment?    EMMI follow up completed and will follow up to assess further care management needs.     Plan: RNCM will refer patient to [place] Management Social Worker for sending Advanced Directives packet and document completion follow up. RNCM will call patient for telephone outreach attempt, within 7 business days, EMMI follow up, to assess for further CM needs, and proceed with case closure, within 10 business days if no return call.      Neeva Trew H. Annia Friendly, BSN, [place] Management [facility] Telephonic CM Phone: [phone] Fax: [phone]

## 2019-12-13 ENCOUNTER — Other Ambulatory Visit: Payer: Self-pay

## 2019-12-13 NOTE — Patient Outreach (Signed)
[facility]) Care Management  12/13/2019  KENTRE OVERMILLER [birth year] [id number]   Social work referral received today from [facility], Sonda Rumble, to send Advance Directive documentation and assist with completion.   Advance Directive Emmi and packet mailed today.  Will follow up within the next two weeks to ensure receipt and assist with completion if needed.  Ronn Melena, BSW Social Worker [phone]

## 2019-12-26 ENCOUNTER — Ambulatory Visit (INDEPENDENT_AMBULATORY_CARE_PROVIDER_SITE_OTHER): Payer: Self-pay | Admitting: *Deleted

## 2019-12-26 DIAGNOSIS — I639 Cerebral infarction, unspecified: Secondary | ICD-10-CM

## 2019-12-26 LAB — CUP PACEART REMOTE DEVICE CHECK
Date Time Interrogation Session: 20210201142743
Implantable Pulse Generator Implant Date: 20201229

## 2019-12-27 ENCOUNTER — Other Ambulatory Visit: Payer: Self-pay

## 2019-12-27 NOTE — Progress Notes (Signed)
ILR Remote 

## 2019-12-27 NOTE — Patient Outreach (Signed)
[facility] [facility]) Care Management  12/27/2019  Darren Pearson [birth year] [id number]   Successful outreach to patient today to ensure receipt of Advance Directives.  Patient confirmed receipt and stated that he and sister reviewed documents.  Offered to schedule another call in which documents could be further reviewed with patient and sister but patient declined.  Closing social work case but did encourage patient to call if questions arise or further assistance is needed.  Ronn Melena, BSW Social Worker [phone]

## 2019-12-28 ENCOUNTER — Encounter: Payer: Self-pay | Admitting: Adult Health

## 2019-12-28 ENCOUNTER — Other Ambulatory Visit: Payer: Self-pay

## 2019-12-28 ENCOUNTER — Ambulatory Visit (INDEPENDENT_AMBULATORY_CARE_PROVIDER_SITE_OTHER): Payer: Self-pay | Admitting: Adult Health

## 2019-12-28 ENCOUNTER — Telehealth: Payer: Self-pay | Admitting: Adult Health

## 2019-12-28 NOTE — Progress Notes (Deleted)
[facility] [address] [place]. [place] [address] [phone]       [place]  [name] Date of Birth:  [birth year] Medical Record Number:  [id number]   Reason for Referral:  hospital stroke follow up    CHIEF COMPLAINT:  No chief complaint on file.   HPI: Darren Pearson being seen today for in office hospital follow-up regarding ***.  History obtained from *** and chart review. Reviewed all radiology images and labs personally.  [name] is a 54 y.o. male with history of hepatitis C and HTN who presented on 11/20/2019 with poor balance for 1.5 weeks, having fallen 3 times with unsteadiness that would resolve with rest.  Evaluated by stroke team and Dr. Erlinda Hong with stroke work-up revealing punctate left CR, left mesial temporal lobe, left parietal cortical and right pontine infarcts as evidenced on MRI secondary to unknown source. Not a tPA candidate as out of the window.  In addition to acute infarcts, MRI also showed small vessel disease, atrophy, old hemorrhagic lacunes R caudate head and right cerebellum.  CTA head/neck negative LVO with small supraclinoid left ICA aneurysm.  Lower extremity Doppler negative for DVT.  2D echo showed an EF of 45 to 50%.  TEE negative for PFO and no evidence of cardiac source of embolus.  Loop recorder placed for long-term monitoring of possible atrial fibrillation.  Hypercoagulable work-up pending at discharge.  UDS negative.  HIV negative.  Recommended DAPT for 3 weeks and aspirin alone.  History of cocaine abuse with last reported use 05/2019.  Current tobacco use with smoking cessation counseling provided.  Initially hypertensive urgency stabilized admission and recommended long-term BP goal normotensive range.  LDL 72 and initiated atorvastatin 40 mg daily.  No evidence or history of DM with A1c 4.2.  Found to have B12 deficiency 170 and recommended B12 supplementation with IM and p.o.  Other stroke  risk factors include EtOH use and history of marijuana use.  Other active problems include hypokalemia, AKI and elevated NH 4.  He was discharged home in stable condition on 11/23/2019.     ROS:   14 system review of systems performed and negative with exception of ***  PMH:  Past Medical History:  Diagnosis Date  . Allergy   . Anxiety and depression   . Asthma   . Hepatitis C   . History of chicken pox   . Hypertension     PSH:  Past Surgical History:  Procedure Laterality Date  . BUBBLE STUDY  11/22/2019   Procedure: BUBBLE STUDY;  Surgeon: Dorothy Spark, MD;  Location: [facility];  Service: Cardiovascular;;  . LOOP RECORDER INSERTION N/A 11/22/2019   Procedure: LOOP RECORDER INSERTION;  Surgeon: Thompson Grayer, MD;  Location: [facility];  Service: Cardiovascular;  Laterality: N/A;  . TEE WITHOUT CARDIOVERSION N/A 11/22/2019   Procedure: TRANSESOPHAGEAL ECHOCARDIOGRAM (TEE);  Surgeon: Dorothy Spark, MD;  Location: [facility];  Service: Cardiovascular;  Laterality: N/A;    Social History:  Social History   Socioeconomic History  . Marital status: Single    Spouse name: Not on file  . Number of children: Not on file  . Years of education: 54  . Highest education level: Not on file  Occupational History  . Occupation: Programmer, applications: [employer]  Tobacco Use  . Smoking status: Former Smoker    Packs/day: 0.25    Types: Cigarettes  Start date: 11/24/2012  . Smokeless tobacco: Never Used  . Tobacco comment: has not smoked since 11/20/2019  Substance and Sexual Activity  . Alcohol use: Not Currently    Alcohol/week: 2.0 standard drinks    Types: 2 Cans of beer per week    Comment: social, states stopped on 11/20/2019.  Marland Kitchen Drug use: Not Currently    Frequency: 5.0 times per week    Types: Marijuana    Comment: Stop 11/20/2019  . Sexual activity: Yes    Birth control/protection: Condom  Other Topics Concern  .  Not on file  Social History Narrative   Regular exercise-yes   Social Determinants of Health   Financial Resource Strain:   . Difficulty of Paying Living Expenses: Not on file  Food Insecurity:   . Worried About Charity fundraiser in the Last Year: Not on file  . Ran Out of Food in the Last Year: Not on file  Transportation Needs: No Transportation Needs  . Lack of Transportation (Medical): No  . Lack of Transportation (Non-Medical): No  Physical Activity:   . Days of Exercise per Week: Not on file  . Minutes of Exercise per Session: Not on file  Stress:   . Feeling of Stress : Not on file  Social Connections:   . Frequency of Communication with Friends and Family: Not on file  . Frequency of Social Gatherings with Friends and Family: Not on file  . Attends Religious Services: Not on file  . Active Member of Clubs or Organizations: Not on file  . Attends Archivist Meetings: Not on file  . Marital Status: Not on file  Intimate Partner Violence:   . Fear of Current or Ex-Partner: Not on file  . Emotionally Abused: Not on file  . Physically Abused: Not on file  . Sexually Abused: Not on file    Family History:  Family History  Problem Relation Age of Onset  . Stomach cancer Mother   . Diabetes Father   . Paget's disease of bone Father     Medications:   Current Outpatient Medications on File Prior to Visit  Medication Sig Dispense Refill  . acetaminophen (TYLENOL) 325 MG tablet Take 650 mg by mouth every 6 (six) hours as needed for mild pain or headache.    Marland Kitchen aspirin 81 MG EC tablet Take 1 tablet (81 mg total) by mouth daily. 90 tablet 0  . atorvastatin (LIPITOR) 40 MG tablet Take 1 tablet (40 mg total) by mouth daily at 6 PM. 90 tablet 0  . cyanocobalamin 1000 MCG tablet Take 1 tablet (1,000 mcg total) by mouth daily. 90 tablet 0  . lisinopril (ZESTRIL) 20 MG tablet Take 1 tablet (20 mg total) by mouth daily. 90 tablet 0  . polyvinyl alcohol (LIQUIFILM  TEARS) 1.4 % ophthalmic solution Place 1 drop into both eyes as needed for dry eyes.     No current facility-administered medications on file prior to visit.    Allergies:   Allergies  Allergen Reactions  . Sulfa Antibiotics Hives     Physical Exam  There were no vitals filed for this visit. There is no height or weight on file to calculate BMI. No exam data present  Depression screen [facility] 2/9 12/01/2019  Decreased Interest 0  Down, Depressed, Hopeless 0  PHQ - 2 Score 0     General: well developed, well nourished, seated, in no evident distress Head: head normocephalic and atraumatic.   Neck: supple with  no carotid or supraclavicular bruits Cardiovascular: regular rate and rhythm, no murmurs Musculoskeletal: no deformity Skin:  no rash/petichiae Vascular:  Normal pulses all extremities   Neurologic Exam Mental Status: Awake and fully alert. Oriented to place and time. Recent and remote memory intact. Attention span, concentration and fund of knowledge appropriate. Mood and affect appropriate.  Cranial Nerves: Fundoscopic exam reveals sharp disc margins. Pupils equal, briskly reactive to light. Extraocular movements full without nystagmus. Visual fields full to confrontation. Hearing intact. Facial sensation intact. Face, tongue, palate moves normally and symmetrically.  Motor: Normal bulk and tone. Normal strength in all tested extremity muscles. Sensory.: intact to touch , pinprick , position and vibratory sensation.  Coordination: Rapid alternating movements normal in all extremities. Finger-to-nose and heel-to-shin performed accurately bilaterally. Gait and Station: Arises from chair without difficulty. Stance is normal. Gait demonstrates normal stride length and balance Reflexes: 1+ and symmetric. Toes downgoing.     NIHSS  *** Modified Rankin  *** CHA2DS2-VASc *** HAS-BLED ***   Diagnostic Data (Labs, Imaging, Testing)  CT HEAD WO  CONTRAST 11/20/2019 IMPRESSION: 1. No focal acute intracranial abnormality identified. 2. Chronic diffuse atrophy. Chronic bilateral periventricular white matter small vessel ischemic change.  CT ANGIO HEAD W OR WO CONTRAST CT ANGIO NECK W OR WO CONTRAST 11/21/2019 IMPRESSION: No large vessel occlusion or hemodynamically significant stenosis. Small aneurysm of the paraclinoid left ICA.  MR BRAIN WO CONTRAST 11/20/2019 IMPRESSION: 1. Acute/subacute bilateral cortical infarction involving the left postcentral gyrus and corona radiata. 2. Additional punctate cortical infarct in the right parietal lobe. 3. Age advanced atrophy and white matter disease likely reflects the sequela of chronic microvascular ischemia. 4. Remote hemorrhagic lacunar infarcts of the right caudate head and right cerebellum. 5. Mild sinus disease as described.  ECHOCARDIOGRAM 11/21/2019 IMPRESSIONS  1. Left ventricular ejection fraction, by visual estimation, is 45 to  50%. The left ventricle has low normal function. There is moderately  increased left ventricular hypertrophy.  2. Left ventricular diastolic parameters are consistent with Grade I  diastolic dysfunction (impaired relaxation).  3. The left ventricle demonstrates global hypokinesis.  4. Global right ventricle has mildly reduced systolic function.The right  ventricular size is normal. No increase in right ventricular wall  thickness.  5. Left atrial size was normal.  6. Right atrial size was normal.  7. The mitral valve is grossly normal. Trivial mitral valve  regurgitation.  8. The tricuspid valve is normal in structure.  9. The aortic valve is tricuspid. Aortic valve regurgitation is not  visualized. Mild aortic valve sclerosis without stenosis.  10. The pulmonic valve was grossly normal. Pulmonic valve regurgitation is  not visualized.  11. The inferior vena cava is normal in size with greater than 50%  respiratory  variability, suggesting right atrial pressure of 3 mmHg.  12. The interatrial septum was not well visualized.   ECHO TEE W COLOR 11/22/2019 IMPRESSIONS  1. Left ventricular ejection fraction, by visual estimation, is 45 to  50%. The left ventricle has mildly decreased function. There is moderately  increased left ventricular hypertrophy.  2. The left ventricle has no regional wall motion abnormalities.  3. Global right ventricle has normal systolic function.The right  ventricular size is normal. No increase in right ventricular wall  thickness.  4. Left atrial size was normal.  5. Right atrial size was normal.  6. The mitral valve is normal in structure. Mild mitral valve  regurgitation. No evidence of mitral stenosis.  7. The tricuspid valve is  normal in structure.  8. The aortic valve is normal in structure. Aortic valve regurgitation is  not visualized. No evidence of aortic valve sclerosis or stenosis.  9. The pulmonic valve was normal in structure. Pulmonic valve  regurgitation is not visualized.  10. The inferior vena cava is normal in size with greater than 50%  respiratory variability, suggesting right atrial pressure of 3 mmHg.  11. No intracardiac source of embolism. Negative bubble study.       ASSESSMENT: Darren Pearson is a 54 y.o. year old male presented with poor balance for 1.5 weeks reporting 3 different falls with unsteadiness that would improve after resting on 11/20/2019 with stroke work-up revealing punctate left CR, left mesial temporal lobe, right parietal cortical and right pontine infarcts secondary to unknown source with loop recorder placed.  Hypercoagulable work-up largely unremarkable except B12 deficient and placed on supplement.  Vascular risk factors include hep C, HTN, prior history of cocaine abuse, current tobacco use, HLD, B12 deficiency, EtOH use and history of marijuana use.     PLAN:  1. Cryptogenic stroke: Continue aspirin 81 mg  daily  and atorvastatin for secondary stroke prevention. Maintain strict control of hypertension with blood pressure goal below 130/90, diabetes with hemoglobin A1c goal below 6.5% and cholesterol with LDL cholesterol (bad cholesterol) goal below 70 mg/dL.  I also advised the patient to eat a healthy diet with plenty of whole grains, cereals, fruits and vegetables, exercise regularly with at least 30 minutes of continuous activity daily and maintain ideal body weight.  Loop recorder will continue to be monitored for possible atrial fibrillation 2. HTN: Advised to continue current treatment regimen.  Today's BP ***.  Advised to continue to monitor at home along with continued follow-up with PCP for management 3. HLD: Advised to continue current treatment regimen along with continued follow-up with PCP for future prescribing and monitoring of lipid panel 4. Polysubstance abuse: 5. B12 deficiency:     Follow up in *** or call earlier if needed   Greater than 50% of time during this 45 minute visit was spent on counseling, explanation of diagnosis of cryptogenic stroke, reviewing risk factor management of HTN, HLD, polysubstance abuse and B12 deficiency, planning of further management along with potential future management, and discussion with patient and family answering all questions.    Frann Rider, AGNP-BC  [facility] [address] [place], [place] [address]  Phone [phone] Fax [phone] Note: This document was prepared with digital dictation and possible smart phrase technology. Any transcriptional errors that result from this process are unintentional.

## 2019-12-28 NOTE — Telephone Encounter (Signed)
Patient was scheduled today for hospital stroke follow-up regarding recent stroke.  Today's visit had to be rescheduled due to suspected/possible bedbugs which were witnessed by CMA who initially roomed patient.  CMA advised him of these concerns who assisted with rescheduling visit in 1 month and refunded today's co-pay.

## 2019-12-29 ENCOUNTER — Ambulatory Visit: Payer: Self-pay | Admitting: *Deleted

## 2019-12-30 ENCOUNTER — Ambulatory Visit: Payer: Self-pay | Admitting: *Deleted

## 2020-01-02 ENCOUNTER — Ambulatory Visit: Payer: Self-pay | Admitting: *Deleted

## 2020-01-02 NOTE — Progress Notes (Signed)
Patient was not seen due to suspected bedbugs.  Advised to reschedule in 1 month's time

## 2020-01-03 ENCOUNTER — Other Ambulatory Visit: Payer: Self-pay | Admitting: *Deleted

## 2020-01-03 NOTE — Patient Outreach (Signed)
[facility]) Care Management  01/03/2020  DAMARQUES SORKIN 07-Aug-1966 [id number]   Subjective: Telephone call to patient's home  / mobile number, no answer, left HIPAA compliant voicemail message, and requested call back.   Objective: Per KPN (Knowledge Performance Now, point of care tool) and chart review, patient hospitalized 11/20/2019 -  11/23/2019 for acute CVA, status post multiple falls.   Patient also has a history of hypertension, hyperlipidemia, Hepatitis C, Asthma, and ICA aneurysm.       Assessment: Received Medicaid EMMI Stroke Red Flag Alert follow up referral on 12/08/2019.  Red Flag Alert Triggers, Day #13, times 2, patient answered no to the following questions: Went to follow-up appointment?   Scheduled a follow-up appointment?    EMMI follow up completed and will follow up to assess further care management needs.     Plan: RNCM will call patient for 2nd telephone outreach attempt, within 4 business days, EMMI follow up, to assess for further CM needs, and proceed with case closure, within 10 business days if no return call.     Immanuel Fedak H. Annia Friendly, BSN, [place] Management [facility] Telephonic CM Phone: [phone] Fax: [phone]

## 2020-01-04 NOTE — Telephone Encounter (Signed)
This encounter was created in error - please disregard.

## 2020-01-06 ENCOUNTER — Other Ambulatory Visit: Payer: Self-pay | Admitting: *Deleted

## 2020-01-06 NOTE — Patient Outreach (Addendum)
[place] [facility]) Care Management  01/06/2020  Darren Pearson 28-Apr-1966 [id number]   Subjective: Telephone call to patient's home  / mobile number, no answer, answering machine message is in spanish, identifying number reached,  left HIPAA compliant voicemail message, and requested call back.   Objective:Per KPN (Knowledge Performance Now, point of care tool) and chart review,patient hospitalized 11/20/2019 - 11/23/2019 for acute CVA, status post multiple falls. Patient also has a history of hypertension, hyperlipidemia, Hepatitis C, Asthma, and ICA aneurysm.      Assessment: Received Medicaid EMMI Stroke Red Flag Alert follow up referral on 12/08/2019. Red Flag Alert Triggers, Day #13, times 2, patient answered no to the following questions: Went to follow-up appointment?Scheduled a follow-up appointment?EMMI follow up completed andwill follow up to assessfurther care management needs.    Plan:RNCM will send unsuccessful outreach letter, [facility] pamphlet, handout: Know Before You Go, will call patient for 3rd telephone outreach attempt within 4 business days, [facility] EMMI follow up, and proceed with case closure, within 10 business days if no return call.       Shay Jhaveri H. Annia Friendly, BSN, [place] Management [facility] Telephonic CM Phone: [phone] Fax: [phone]

## 2020-01-09 ENCOUNTER — Other Ambulatory Visit: Payer: Self-pay | Admitting: *Deleted

## 2020-01-09 NOTE — Patient Outreach (Signed)
[name]) Care Management  01/09/2020  Darren Pearson Feb 11, 1966 [id number]   Subjective: Telephone call to patient's home / mobile number, spoke with patient, and HIPAA verified.  Patient states he is doing pretty good, and remembers speaking with this RNCM in the past. States he has not seen anyone for his hospital follow up visits, he went to the neurologist follow up visit, but was not seen because there was a problem in the office, and the appointment was rescheduled.  Per chart review appointment was rescheduled to 01/31/2020 by provider office. Per chart review, patient has video visit with primary provider Darren Pearson, Nurse Practitioner at [facility]) on 12/01/2019 to establish care. Patient had question regarding how to access primary provider if needed, advised patient how to access care, patient voiced understanding, and patient verbally given primary provider's office contact number [phone]).  Patient states he is still having problems with memory related to the stroke, wanted to know how long this will last, advised patient to follow up with neurologist, and /or primary provider to discuss his concerns, patient voiced understanding, and states he will follow up.  RNCM educated patient on general memory issue related to strokes, how therapies, can sometimes assist with memory issues, patient voices understanding, states he has receive only one visit from occupational and physical therapy, since hospital discharge, does not know if he is to receive additional visits.   RNCM advised will follow up with home heath agency to regarding therapy treatment plan,further visits, and call him back with an update. Patient states he does not have any education material, EMMI follow up, transportation, [employer], or pharmacy needs at this time. States he is very appreciative of the follow up, is in agreement  to receive 1 additional follow up call to  assess for further CM needs, and is in agreement to receive [place] Management EMMI follow up calls as needed.    Per chart review, patient was referred to [facility] for home health services during recent hospitalization. Telephone call to [name] at [facility] [phone]), states RNCM has received the home care division and states to call the home health division at [phone].   Telephone call to [name] at [facility] [phone]), states patient is a current patient, has received services, has not been discharged from services, has no pending visits on the schedule, not sure why patient has no pending visits, and will transfer this RNCM to their scheduler Phineas Real) to update on patient's next visit.  [name] attempted to transfer call and disconnected call. Telephone to [name] at [facility], advised of above conversation with [name], advised still needing to verify patient's next scheduled home health visit, previous transfer attempt unsuccessful,  Kim voices understanding, and transferred this RNCM to [employer] [phone]).  RNCM advised [name] of above conversations, states she will follow up with scheduler to research why patient has not received any additional home health visits, and call RNCM back with an update.  [name] states patient received 1 occupational therapy visit on 12/02/2019 and 1 physical therapy visit on 12/06/2019, has not received any additional visits.  Received telephone call from [name] at [place], states patient was authorized to receive 2 physical therapy and 1 occupational therapy visits through the [facility], and is not sure why patient has not received the additional physical therapy visit.   RNCM advised patient may have Medicaid and asked if patient could receive additional  visits due to his stroke diagnosis.  [name] states she will follow up with her therapy supervisor to see if patient can receive additional  visits and call RNCM back with an update.     Objective: Per KPN (Knowledge Performance Now, point of care tool) and chart review, patient hospitalized 11/20/2019 -  11/23/2019 for acute CVA, status post multiple falls.   Patient also has a history of hypertension, hyperlipidemia, Hepatitis C, Asthma, and ICA aneurysm.       Assessment: Received Medicaid Potential EMMI Stroke Red Flag Alert follow up referral on 12/08/2019.  Red Flag Alert Triggers, Day #13, times 2, patient answered no to the following questions: Went to follow-up appointment?   Scheduled a follow-up appointment?    EMMI follow up completed and will follow up to assess further care management needs.     Plan: RNCM will follow up home health agency to verify therapy treatment plan, further visit schedule, within 3 business days, and follow up with patient within 3 business days of the update.  RNCM will call patient for telephone outreach attempt, within 7 business days, EMMI follow up, to assess for further CM needs, and proceed with case closure, within 10 business days if no return call.     [name] Friendly, BSN, [place] Management [facility] Telephonic CM Phone: [phone] Fax: [phone]

## 2020-01-12 ENCOUNTER — Other Ambulatory Visit: Payer: Self-pay | Admitting: *Deleted

## 2020-01-12 NOTE — Patient Outreach (Signed)
[facility]) Care Management  01/12/2020  THADE SLEDZ 1965/11/25 [id number]   Subjective: Received voicemail from [place] at [place] [phone]), states she would like to discuss mutual patient Darren Pearson,  provide update on services, and requested call back. Telephone call to [facility], no answer, left HIPAA compliant voicemail message, and requested call back.  Telephone call to patient's home  / mobile number, no answer, left HIPAA compliant voicemail message, and requested call back.    Objective:Per KPN (Knowledge Performance Now, point of care tool) and chart review,patient hospitalized 11/20/2019 - 11/23/2019 for acute CVA, status post multiple falls. Patient also has a history of hypertension, hyperlipidemia, Hepatitis C, Asthma, and ICA aneurysm.      Assessment: Received Medicaid Potential EMMI Stroke Red Flag Alert follow up referral on 12/08/2019. Red Flag Alert Triggers, Day #13, times 2, patient answered no to the following questions: Went to follow-up appointment?Scheduled a follow-up appointment?EMMI follow up completed andwill follow up to assessfurther care management needs.    Plan: RNCM will follow up home health agency to verify therapy treatment plan, further visit schedule, within 3 business days, and follow up with patient within 3 business days of the update.  RNCM will call patient for telephone outreach attempt, within 7 business days, EMMI follow up, to assess for further CM needs, and proceed with case closure, within 10 business days if no return call.     Mildred Tuccillo H. Annia Friendly, BSN, [place] Management [facility] Telephonic CM Phone: [phone] Fax: [phone]

## 2020-01-13 ENCOUNTER — Other Ambulatory Visit: Payer: Self-pay | Admitting: *Deleted

## 2020-01-13 NOTE — Patient Outreach (Signed)
[facility]) Care Management  01/13/2020  Darren Pearson 1966/07/02 [id number]   Subjective: Received voicemail from [place] at [place] [phone]), states she is returning call regarding patient Darren Pearson, and requested call back. Telephone call to [facility], no answer, left HIPAA compliant voicemail message, and requested call back.  Telephone call from [place] at [facility], states patient was referred to her agency under the [facility] in January of 2021, the program was approved for 30 days, and authorization ended at the end of January.  States patient received physical and occupational therapy evaluations.  Patient was assessed by therapist, as being at a independent activity level, given home exercise program, and no other needs were identified.  States through the program, patient receives weekly check in calls from their navigation team member Darren Pearson, Education officer, museum), she verified no additional needs, and patient was given verbal education.  States the length of the program, and program requirements (e.g. must follow up with providers as scheduled), are explained to patient in detail, and patient must agree to it.   RNCM advised of RNCM role, EMMI Stroke 30 day program follow up, 3 unsuccessful patient outreach attempts, patient's voicemail is in [place], and conversations with patient.  RNCM advised patient verbalized he did not understand home health services treatment plan and did not know if home services were completed.   RNCM advised patient stated that home health had only been out 1 time and he had not  heard anything else from the therapist.  RNCM advised patient went to neurologist follow up appointment and appointment was rescheduled by provider.  RNCM advised patient expressed concerns regarding memory issues and was advised to discuss with providers.  Darren Pearson states they could extend services  to patient if needed, if provider referred patient due to a change of status or additional needs, and patient has insurance.  Darren Pearson states she will follow up with the navigation team to verify what was discussed with patient during follow up conversations, will verify if there were any concerns expressed on the calls, and call this RNCM back with an update.  RNCM advised will follow up with neurologist office and / or primary provider to update if needed, based on follow up with patient, once additional update obtained from [facility].    Objective:Per KPN (Knowledge Performance Now, point of care tool) and chart review,patient hospitalized 11/20/2019 - 11/23/2019 for acute CVA, status post multiple falls. Patient also has a history of hypertension, hyperlipidemia, Hepatitis C, Asthma, and ICA aneurysm.      Assessment: Received [place] Stroke Red Flag Alert follow up referral on 12/08/2019. Red Flag Alert Triggers, Day #13, times 2, patient answered no to the following questions: Went to follow-up appointment?Scheduled a follow-up appointment?EMMI follow up completed andwill follow up to assessfurther care management needs.    Plan:RNCM will follow up home health agency to verify navigation team follow up , within 4 business days.  RNCM will call patient for telephone outreach attempt, within 3 business days of home agency update, EMMI follow up, update on status of home health, will follow up with providers if needed based on outcome of follow up with patient, to assess for further CM needs, and proceed with case closure, within 10 business days if no return call.     Darren Pearson H. Annia Friendly, BSN, [place] Management [facility] Telephonic CM Phone: [phone] Fax: [phone]

## 2020-01-16 ENCOUNTER — Other Ambulatory Visit: Payer: Self-pay | Admitting: *Deleted

## 2020-01-16 NOTE — Patient Outreach (Signed)
[facility]) Care Management  01/16/2020  Darren Pearson Apr 04, 1966 [id number]   Subjective: Telephone call to patient's home / mobile number, spoke with patient, and HIPAA verified.  States he remembers speaking with this RNCM in the past and is doing pretty good.  RNCM advised of conversations with [facility] regarding status of physical and occupational therapy visits, patient voices understanding, appreciative of update.  Patient states he does not remember receiving weekly calls from [facility],  RNCM advised waiting for confirmation from [facility] regarding content of weekly callings and will call patient back with an update.  Patient states he has not spoken with neurologist office regarding memory issue concerns.  RNCM advised will follow with providers if needed once final update obtained from [facility] regarding home health status, patient voiced understanding, and is in agreement.  Patient states he does not have any education material, EMMI follow up, care coordination, care management, disease monitoring, transportation, community resource, or pharmacy needs at this time. States he is very appreciative of the follow up, is in agreement  to receive 1 additional follow up call to assess for further CM needs, and is in agreement to receive [name] Management EMMI follow up calls as needed.    Objective:Per KPN (Knowledge Performance Now, point of care tool) and chart review,patient hospitalized 11/20/2019 - 11/23/2019 for acute CVA, status post multiple falls. Patient also has a history of hypertension, hyperlipidemia, Hepatitis C, Asthma, and ICA aneurysm.      Assessment: Received [place] Stroke Red Flag Alert follow up referral on 12/08/2019. Red Flag Alert Triggers, Day #13, times 2, patient answered no to the following questions: Went to follow-up appointment?Scheduled a follow-up appointment?EMMI follow up completed andwill  follow up to assessfurther care management needs.    Plan:RNCM will follow up home health agency to verify navigation team follow up , within 4 business days.  RNCM will call patient for telephone outreach attempt, within 3 business days of home agency update, EMMI follow up, update on status of home health, will follow up with providers if needed based on outcome of follow up with patient, to assess for further CM needs, and proceed with case closure, within 10 business days if no return call.     Darren Pearson H. Annia Friendly, BSN, [place] Management [facility] Telephonic CM Phone: [phone] Fax: [phone]

## 2020-01-19 ENCOUNTER — Telehealth: Payer: Self-pay | Admitting: Adult Health

## 2020-01-19 ENCOUNTER — Other Ambulatory Visit: Payer: Self-pay | Admitting: *Deleted

## 2020-01-19 NOTE — Telephone Encounter (Signed)
Darlene case Freight forwarder with [employer] called Wanting to touch base in regards to patient reported issues requested a CB

## 2020-01-19 NOTE — Patient Outreach (Signed)
[facility]) Care Management  01/19/2020  JAMARD HARPIN 1966/10/26 [id number]   Subjective: Telephone call to Benjamine Mola at [facility] [phone]), states she has no additional updates regarding the weekly check in calls from their navigation team, and no additional identified needs on their calls.  States patient can be re-referred to them when patient obtains insurance and no additional visits available under the [place] advised spoke with patient regarding weekly check in [facility] advised will update patient's provider regarding patient's concerns with memory and home health status.  Telephone call to [facility] [place], advised need to speak with provider regarding patient's identified concerns with memory,  and update on EMMI Stroke follow ups, requested call back to discuss.     Objective:Per KPN (Knowledge Performance Now, point of care tool) and chart review,patient hospitalized 11/20/2019 - 11/23/2019 for acute CVA, status post multiple falls. Patient also has a history of hypertension, hyperlipidemia, Hepatitis C, Asthma, and ICA aneurysm.      Assessment: Received [place] Stroke Red Flag Alert follow up referral on 12/08/2019. Red Flag Alert Triggers, Day #13, times 2, patient answered no to the following questions: Went to follow-up appointment?Scheduled a follow-up appointment?EMMI follow up completed andwill follow up to assessfurther care management needs.    Plan:RNCM will call patient for telephone outreach attempt, within3business daysof provider update, EMMI follow up,to assess for further CM needs, and proceed with case closure, within 10 business days if no return call.     Allaina Brotzman H. Annia Friendly, BSN, [place] Management [facility] Telephonic CM Phone: [phone] Fax: [phone]

## 2020-01-23 NOTE — Telephone Encounter (Signed)
I called and LVM.

## 2020-01-24 ENCOUNTER — Other Ambulatory Visit: Payer: Self-pay | Admitting: *Deleted

## 2020-01-24 NOTE — Patient Outreach (Signed)
[facility]) Care Management  01/24/2020  Darren Pearson [birth year] [id number]   Subjective: Received voicemail from Darren Pearson at [facility], states she is returning call from 01/19/2020, and requested call back.  Telephone to Darren Pearson at [facility], advised of this RNCM's role, results of EMMI Stroke follow up calls, patient's concerns regarding memory,  update from home health agency ([facility]) regarding patient's home health services, and evaluation for future home health services if needed.  Darren Pearson voices understanding, very appreciative of the updates, and states she will send update message to Dr. Leonie Pearson who will see patient on 01/31/2020 for follow up visit.  RNCM advised will also follow up with patient to encourage patient to also discuss his concerns ( memory issues, home health services) with Dr. Leonie Pearson during follow up visit.   Telephone call to patient's home / mobile number, spoke with patient, and HIPAA verified.  States he remembers speaking with this RNCM in the past and is doing pretty good.  RNCM advised of conversations with [facility] regarding status of physical and occupational therapy visits, conversation with [facility],  patient voices understanding, appreciative of update.   Patient states he was unaware of his follow up appointment with Dr. Theresia Pearson.   RNCM initiated conference with this RNCM, and Darren Pearson at [facility], Darren Pearson verified with patient follow up appointment with Dr. Leonie Pearson on 01/31/2020, with 11:00am check in time, patient in agreement, voices understanding.   Patient states is aware of his follow up appointment for pacer check on 01/30/2020, appointment location, and time.  Patient states he does not have any education material, EMMI follow up, care coordination, care management, disease monitoring, transportation, community resource, or pharmacy needs at this time.    Patient is aware he has completed 30 day EMMI Stroke follow up program and has no additional CM needs at this time.      Objective:Per KPN (Knowledge Performance Now, point of care tool) and chart review,patient hospitalized 11/20/2019 - 11/23/2019 for acute CVA, status post multiple falls. Patient also has a history of hypertension, hyperlipidemia, Hepatitis C, Asthma, and ICA aneurysm.      Assessment: Received [place] Stroke Red Flag Alert follow up referral on 12/08/2019. Red Flag Alert Triggers, Day #13, times 2, patient answered no to the following questions: Went to follow-up appointment?Scheduled a follow-up appointment?EMMI follow up completed andno further care management needs.     Plan:RNCM will complete case closure due to follow up completed / no care management needs.      Darren Pearson H. Annia Friendly, BSN, [place] Management [facility] Telephonic CM Phone: [phone] Fax: [phone]

## 2020-01-24 NOTE — Telephone Encounter (Signed)
I spoke to [place] the Tourist information centre manager.  She reports that she has spoken to the patient and he has some concerns about his memory.  He also was only able to do 1 session of physical therapy due to insurance.  I advised that I would make a note of this so Dr. Leonie Man can address when he sees the patient on Monday.

## 2020-01-26 NOTE — Telephone Encounter (Signed)
Okay thanks for letting me know

## 2020-01-30 ENCOUNTER — Ambulatory Visit (INDEPENDENT_AMBULATORY_CARE_PROVIDER_SITE_OTHER): Payer: Self-pay | Admitting: *Deleted

## 2020-01-30 DIAGNOSIS — I639 Cerebral infarction, unspecified: Secondary | ICD-10-CM

## 2020-01-30 LAB — CUP PACEART REMOTE DEVICE CHECK
Date Time Interrogation Session: 20210307230439
Implantable Pulse Generator Implant Date: 20201229

## 2020-01-31 ENCOUNTER — Encounter: Payer: Self-pay | Admitting: Neurology

## 2020-01-31 ENCOUNTER — Other Ambulatory Visit: Payer: Self-pay

## 2020-01-31 ENCOUNTER — Ambulatory Visit (INDEPENDENT_AMBULATORY_CARE_PROVIDER_SITE_OTHER): Payer: Self-pay | Admitting: Neurology

## 2020-01-31 VITALS — BP 168/96 | HR 89 | Temp 97.7°F | Ht 69.0 in | Wt 151.0 lb

## 2020-01-31 DIAGNOSIS — I6381 Other cerebral infarction due to occlusion or stenosis of small artery: Secondary | ICD-10-CM

## 2020-01-31 DIAGNOSIS — I639 Cerebral infarction, unspecified: Secondary | ICD-10-CM

## 2020-01-31 DIAGNOSIS — G8110 Spastic hemiplegia affecting unspecified side: Secondary | ICD-10-CM

## 2020-01-31 NOTE — Progress Notes (Signed)
[facility] [address] [place]. [place] [address] [phone]       OFFICE CONSULT NOTE  [name] Date of Birth:  Mar 28, 1966 Medical Record Number:  [id number]   Referring MD:  Darren Pearson Reason for Referral: Stroke HPI: [name] is a 54 year old African-American male seen today for initial office consultation visit following hospital admission at [facility] for stroke in December 2020. History is obtained from the patient, his sister is accompanying her as well as review of electronic medical records and I personally reviewed imaging films in PACS. He is a 54 year old male with past history of hypertension, hyperlipidemia, hepatitis C who presented to [facility] on 11/20/2019 with 1-1/2-week history of having had 3 falls. He denied any focal weakness at that time but CT scan showed diffuse cerebral atrophy and bilateral changes of white matter disease and left pontine remote age lacunar infarct. MRI scan showed acute and subacute punctate bilateral cortical as well as left corona radiata as well as right medial pontine infarcts and remote age lacunar infarcts in the right caudate head, right cerebellum. CT angiogram of the head and neck did not show any large vessel occlusion but showed only a small left paraclinoid ICA aneurysm. Lower extremity venous Dopplers were negative for DVT. 2D echo showed slightly diminished ejection fraction of 45 to 50% but transesophageal echo was negative for intracardiac thrombus and there was no PFO. LDL cholesterol was 74 mg percent hemoglobin A1c 4.2. Urine drug screen was negative. HIV was negative. Hypercoagulable panel labs were negative. ANA was negative. Vitamin B12 was low at 170 and TSH was normal. Patient had loop recorder placed on 11/22/2019 and so for paroxysmal A. fib has not been found. Patient had only mild left-sided weakness and incoordination. He states that this is remain. His has trouble with walking and  balance. [name] she is almost fallen a few times but managed to catch himself. Does use a cane. Is had no recurrent stroke or TIA symptoms. The patient continues to smoke but knows that he needs to quit smoking. He took aspirin Plavix for 3 weeks and is now stopped Plavix and is on aspirin alone which is tolerating well without bruising or bleeding. He states his blood pressure is usually better controlled but today it is elevated at 160/96. He is tolerating Lipitor well without muscle aches and pains. He denies any prior history of strokes or TIAs but MRI scan clearly showed prior lacunar infarcts. He denies family history of strokes or TIAs ROS:   14 system review of systems is positive for weakness, gait difficulty, imbalance, falls and all other systems negative  PMH:  Past Medical History:  Diagnosis Date  . Allergy   . Anxiety and depression   . Asthma   . Hepatitis C   . History of chicken pox   . Hypertension     Social History:  Social History   Socioeconomic History  . Marital status: Single    Spouse name: Not on file  . Number of children: Not on file  . Years of education: 60  . Highest education level: Not on file  Occupational History  . Occupation: Programmer, applications: [employer]  Tobacco Use  . Smoking status: Former Smoker    Packs/day: 0.25    Types: Cigarettes    Start date: 11/24/2012  . Smokeless tobacco: Never Used  . Tobacco comment: has not smoked since 11/20/2019  Substance and  Sexual Activity  . Alcohol use: Not Currently    Alcohol/week: 2.0 standard drinks    Types: 2 Cans of beer per week    Comment: social, states stopped on 11/20/2019.  Marland Kitchen Drug use: Not Currently    Frequency: 5.0 times per week    Types: Marijuana    Comment: Stop 11/20/2019  . Sexual activity: Yes    Birth control/protection: Condom  Other Topics Concern  . Not on file  Social History Narrative   Regular exercise-yes   Social Determinants of  Health   Financial Resource Strain:   . Difficulty of Paying Living Expenses: Not on file  Food Insecurity:   . Worried About Charity fundraiser in the Last Year: Not on file  . Ran Out of Food in the Last Year: Not on file  Transportation Needs: No Transportation Needs  . Lack of Transportation (Medical): No  . Lack of Transportation (Non-Medical): No  Physical Activity:   . Days of Exercise per Week: Not on file  . Minutes of Exercise per Session: Not on file  Stress:   . Feeling of Stress : Not on file  Social Connections:   . Frequency of Communication with Friends and Family: Not on file  . Frequency of Social Gatherings with Friends and Family: Not on file  . Attends Religious Services: Not on file  . Active Member of Clubs or Organizations: Not on file  . Attends Archivist Meetings: Not on file  . Marital Status: Not on file  Intimate Partner Violence:   . Fear of Current or Ex-Partner: Not on file  . Emotionally Abused: Not on file  . Physically Abused: Not on file  . Sexually Abused: Not on file    Medications:   Current Outpatient Medications on File Prior to Visit  Medication Sig Dispense Refill  . acetaminophen (TYLENOL) 325 MG tablet Take 650 mg by mouth every 6 (six) hours as needed for mild pain or headache.    Marland Kitchen aspirin 81 MG EC tablet Take 1 tablet (81 mg total) by mouth daily. 90 tablet 0  . atorvastatin (LIPITOR) 40 MG tablet Take 1 tablet (40 mg total) by mouth daily at 6 PM. 90 tablet 0  . cyanocobalamin 1000 MCG tablet Take 1 tablet (1,000 mcg total) by mouth daily. 90 tablet 0  . lisinopril (ZESTRIL) 20 MG tablet Take 1 tablet (20 mg total) by mouth daily. 90 tablet 0   No current facility-administered medications on file prior to visit.    Allergies:   Allergies  Allergen Reactions  . Sulfa Antibiotics Hives    Physical Exam General: well developed, well nourished middle-aged African-American male, seated, in no evident  distress Head: head normocephalic and atraumatic.   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 Oriented to place and time. Recent and remote memory intact. Attention span, concentration and fund of knowledge appropriate. Mood and affect appropriate.  Cranial Nerves: Fundoscopic exam reveals sharp disc margins. Pupils equal, briskly reactive to light. Extraocular movements full without nystagmus. Visual fields full to confrontation. Hearing intact. Facial sensation intact. Mild left lower facial weakness., tongue, palate moves normally and symmetrically.  Motor: Mild spastic left hemiparesis with 4/5 strength with weakness of left grip as  well as intrinsic hand muscles and left hip flexors and ankle dorsiflexors. Tone is increased on the left compared to the right.. Sensory.: intact to touch , pinprick , position and vibratory sensation.  Coordination: Slightly impaired left finger-to-nose and neutral coordination. Normal on the right. Gait and Station: Arises from chair with  difficulty. Walks with slight's stiffness and circumduction of the left leg and is off balance and standing on a narrow base and while making a turn. Unable to do tandem walk Reflexes: 1+ and symmetric. Toes downgoing.   NIHSS  3 Modified Rankin  2   ASSESSMENT: 54 year old African-American male with multiple by cerebral cortical and subcortical infarcts likely of mixed cryptogenic etiology and small vessel disease in December 2020. Multiple vascular risk factors of smoking, hypertension, hyperlipidemia, peripheral arterial disease and prior strokes     PLAN: I had a long d/w patient and his sister about his recent multiple strokes, risk  for recurrent stroke/TIAs, personally independently reviewed imaging studies and stroke evaluation results and answered questions.Continue Plavix for secondary stroke prevention and maintain strict control of hypertension with blood pressure goal below 130/90, diabetes with hemoglobin A1c goal below 6.5% and lipids with LDL cholesterol goal below 70 mg/dL. I also advised the patient to eat a healthy diet with plenty of whole grains, cereals, fruits and vegetables, exercise regularly and maintain ideal body weight he wcommend outpatient physical and occupational therapy for gait and balance.He was counseled to quit smoking and to seek help from primary care physician for the same. Greater than 50% time during this 45-minute consultation visit was spent on counseling and coordination of care about his multiple strokes and discussion about stroke prevention and treatment and answering questions followup in the future with my nurse practitioner Janett Billow in 3 months or call earlier if necessary. Antony Contras, MD  [facility] [address] [place], [place] [address]  Phone [phone] Fax [phone] Note: This document was prepared with digital dictation and possible smart phrase technology. Any transcriptional errors that result from this process are unintentional.

## 2020-01-31 NOTE — Progress Notes (Signed)
ILR Remote 

## 2020-01-31 NOTE — Patient Instructions (Signed)
I had a long d/w patient and his sister about his recent multiple strokes, risk for recurrent stroke/TIAs, personally independently reviewed imaging studies and stroke evaluation results and answered questions.Continue Plavix for secondary stroke prevention and maintain strict control of hypertension with blood pressure goal below 130/90, diabetes with hemoglobin A1c goal below 6.5% and lipids with LDL cholesterol goal below 70 mg/dL. I also advised the patient to eat a healthy diet with plenty of whole grains, cereals, fruits and vegetables, exercise regularly and maintain ideal body weight he wcommend outpatient physical and occupational therapy for gait and balance.He was counseled to quit smoking and to seek help from primary care physician for the same.  Followup in the future with my nurse practitioner Janett Billow in 3 months or call earlier if necessary.  Stroke Prevention Some medical conditions and behaviors are associated with a higher chance of having a stroke. You can help prevent a stroke by making nutrition, lifestyle, and other changes, including managing any medical conditions you may have. What nutrition changes can be made?   Eat healthy foods. You can do this by: ? Choosing foods high in fiber, such as fresh fruits and vegetables and whole grains. ? Eating at least 5 or more servings of fruits and vegetables a day. Try to fill half of your plate at each meal with fruits and vegetables. ? Choosing lean protein foods, such as lean cuts of meat, poultry without skin, fish, tofu, beans, and nuts. ? Eating low-fat dairy products. ? Avoiding foods that are high in salt (sodium). This can help lower blood pressure. ? Avoiding foods that have saturated fat, trans fat, and cholesterol. This can help prevent high cholesterol. ? Avoiding processed and premade foods.  Follow your health care provider's specific guidelines for losing weight, controlling high blood pressure (hypertension), lowering  high cholesterol, and managing diabetes. These may include: ? Reducing your daily calorie intake. ? Limiting your daily sodium intake to 1,500 milligrams (mg). ? Using only healthy fats for cooking, such as olive oil, canola oil, or sunflower oil. ? Counting your daily carbohydrate intake. What lifestyle changes can be made?  Maintain a healthy weight. Talk to your health care provider about your ideal weight.  Get at least 30 minutes of moderate physical activity at least 5 days a week. Moderate activity includes brisk walking, biking, and swimming.  Do not use any products that contain nicotine or tobacco, such as cigarettes and e-cigarettes. If you need help quitting, ask your health care provider. It may also be helpful to avoid exposure to secondhand smoke.  Limit alcohol intake to no more than 1 drink a day for nonpregnant women and 2 drinks a day for men. One drink equals 12 oz of beer, 5 oz of wine, or 1 oz of hard liquor.  Stop any illegal drug use.  Avoid taking birth control pills. Talk to your health care provider about the risks of taking birth control pills if: ? You are over 33 years old. ? You smoke. ? You get migraines. ? You have ever had a blood clot. What other changes can be made?  Manage your cholesterol levels. ? Eating a healthy diet is important for preventing high cholesterol. If cholesterol cannot be managed through diet alone, you may also need to take medicines. ? Take any prescribed medicines to control your cholesterol as told by your health care provider.  Manage your diabetes. ? Eating a healthy diet and exercising regularly are important parts of managing your blood  sugar. If your blood sugar cannot be managed through diet and exercise, you may need to take medicines. ? Take any prescribed medicines to control your diabetes as told by your health care provider.  Control your hypertension. ? To reduce your risk of stroke, try to keep your blood  pressure below 130/80. ? Eating a healthy diet and exercising regularly are an important part of controlling your blood pressure. If your blood pressure cannot be managed through diet and exercise, you may need to take medicines. ? Take any prescribed medicines to control hypertension as told by your health care provider. ? Ask your health care provider if you should monitor your blood pressure at home. ? Have your blood pressure checked every year, even if your blood pressure is normal. Blood pressure increases with age and some medical conditions.  Get evaluated for sleep disorders (sleep apnea). Talk to your health care provider about getting a sleep evaluation if you snore a lot or have excessive sleepiness.  Take over-the-counter and prescription medicines only as told by your health care provider. Aspirin or blood thinners (antiplatelets or anticoagulants) may be recommended to reduce your risk of forming blood clots that can lead to stroke.  Make sure that any other medical conditions you have, such as atrial fibrillation or atherosclerosis, are managed. What are the warning signs of a stroke? The warning signs of a stroke can be easily remembered as BEFAST.  B is for balance. Signs include: ? Dizziness. ? Loss of balance or coordination. ? Sudden trouble walking.  E is for eyes. Signs include: ? A sudden change in vision. ? Trouble seeing.  F is for face. Signs include: ? Sudden weakness or numbness of the face. ? The face or eyelid drooping to one side.  A is for arms. Signs include: ? Sudden weakness or numbness of the arm, usually on one side of the body.  S is for speech. Signs include: ? Trouble speaking (aphasia). ? Trouble understanding.  T is for time. ? These symptoms may represent a serious problem that is an emergency. Do not wait to see if the symptoms will go away. Get medical help right away. Call your local emergency services (911 in the [place] Do not drive  yourself to the hospital.  Other signs of stroke may include: ? A sudden, severe headache with no known cause. ? Nausea or vomiting. ? Seizure. Where to find more information For more information, visit:  American Stroke Association: www.strokeassociation.org  National Stroke Association: www.stroke.org Summary  You can prevent a stroke by eating healthy, exercising, not smoking, limiting alcohol intake, and managing any medical conditions you may have.  Do not use any products that contain nicotine or tobacco, such as cigarettes and e-cigarettes. If you need help quitting, ask your health care provider. It may also be helpful to avoid exposure to secondhand smoke.  Remember BEFAST for warning signs of stroke. Get help right away if you or a loved one has any of these signs. This information is not intended to replace advice given to you by your health care provider. Make sure you discuss any questions you have with your health care provider. Document Revised: 10/23/2017 Document Reviewed: 12/16/2016 Elsevier Patient Education  2020 [employer].

## 2020-04-09 ENCOUNTER — Ambulatory Visit (INDEPENDENT_AMBULATORY_CARE_PROVIDER_SITE_OTHER): Payer: Self-pay | Admitting: Primary Care

## 2020-04-09 ENCOUNTER — Encounter (INDEPENDENT_AMBULATORY_CARE_PROVIDER_SITE_OTHER): Payer: Self-pay | Admitting: Primary Care

## 2020-04-09 ENCOUNTER — Other Ambulatory Visit: Payer: Self-pay

## 2020-04-09 VITALS — BP 168/93 | HR 85 | Temp 97.3°F | Ht 69.0 in | Wt 155.8 lb

## 2020-04-09 DIAGNOSIS — Z716 Tobacco abuse counseling: Secondary | ICD-10-CM

## 2020-04-09 DIAGNOSIS — Z23 Encounter for immunization: Secondary | ICD-10-CM

## 2020-04-09 DIAGNOSIS — I639 Cerebral infarction, unspecified: Secondary | ICD-10-CM

## 2020-04-09 DIAGNOSIS — I10 Essential (primary) hypertension: Secondary | ICD-10-CM

## 2020-04-09 MED ORDER — LISINOPRIL-HYDROCHLOROTHIAZIDE 20-25 MG PO TABS: 1.0000 | ORAL_TABLET | Freq: Every day | ORAL | 3 refills | Status: DC

## 2020-04-09 MED ORDER — AMLODIPINE BESYLATE 10 MG PO TABS: 10.0000 mg | ORAL_TABLET | Freq: Every day | ORAL | 3 refills | Status: DC

## 2020-04-09 MED FILL — AMLODIPINE BESYLATE 10 MG T: 10 | 30 days supply | Qty: 30 | Fill #0

## 2020-04-09 MED FILL — LISINOPRIL-HYDROCHLOROTHIAZ: 20-25 | 30 days supply | Qty: 30 | Fill #0

## 2020-04-09 NOTE — Patient Instructions (Addendum)
Hypertension, Adult Hypertension is another name for high blood pressure. High blood pressure forces your heart to work harder to pump blood. This can cause problems over time. There are two numbers in a blood pressure reading. There is a top number (systolic) over a bottom number (diastolic). It is best to have a blood pressure that is below 120/80. Healthy choices can help lower your blood pressure, or you may need medicine to help lower it. What are the causes? The cause of this condition is not known. Some conditions may be related to high blood pressure. What increases the risk?  Smoking.  Having type 2 diabetes mellitus, high cholesterol, or both.  Not getting enough exercise or physical activity.  Being overweight.  Having too much fat, sugar, calories, or salt (sodium) in your diet.  Drinking too much alcohol.  Having long-term (chronic) kidney disease.  Having a family history of high blood pressure.  Age. Risk increases with age.  Race. You may be at higher risk if you are African American.  Gender. Men are at higher risk than women before age 45. After age 65, women are at higher risk than men.  Having obstructive sleep apnea.  Stress. What are the signs or symptoms?  High blood pressure may not cause symptoms. Very high blood pressure (hypertensive crisis) may cause: ? Headache. ? Feelings of worry or nervousness (anxiety). ? Shortness of breath. ? Nosebleed. ? A feeling of being sick to your stomach (nausea). ? Throwing up (vomiting). ? Changes in how you see. ? Very bad chest pain. ? Seizures. How is this treated?  This condition is treated by making healthy lifestyle changes, such as: ? Eating healthy foods. ? Exercising more. ? Drinking less alcohol.  Your health care provider may prescribe medicine if lifestyle changes are not enough to get your blood pressure under control, and if: ? Your top number is above 130. ? Your bottom number is above  80.  Your personal target blood pressure may vary. Follow these instructions at home: Eating and drinking   If told, follow the DASH eating plan. To follow this plan: ? Fill one half of your plate at each meal with fruits and vegetables. ? Fill one fourth of your plate at each meal with whole grains. Whole grains include whole-wheat pasta, brown rice, and whole-grain bread. ? Eat or drink low-fat dairy products, such as skim milk or low-fat yogurt. ? Fill one fourth of your plate at each meal with low-fat (lean) proteins. Low-fat proteins include fish, chicken without skin, eggs, beans, and tofu. ? Avoid fatty meat, cured and processed meat, or chicken with skin. ? Avoid pre-made or processed food.  Eat less than 1,500 mg of salt each day.  Do not drink alcohol if: ? Your doctor tells you not to drink. ? You are pregnant, may be pregnant, or are planning to become pregnant.  If you drink alcohol: ? Limit how much you use to:  0-1 drink a day for women.  0-2 drinks a day for men. ? Be aware of how much alcohol is in your drink. In the [place] one drink equals one 12 oz bottle of beer (355 mL), one 5 oz glass of wine (148 mL), or one 1 oz glass of hard liquor (44 mL). Lifestyle   Work with your doctor to stay at a healthy weight or to lose weight. Ask your doctor what the best weight is for you.  Get at least 30 minutes of exercise most   days of the week. This may include walking, swimming, or biking.  Get at least 30 minutes of exercise that strengthens your muscles (resistance exercise) at least 3 days a week. This may include lifting weights or doing Pilates.  Do not use any products that contain nicotine or tobacco, such as cigarettes, e-cigarettes, and chewing tobacco. If you need help quitting, ask your doctor.  Check your blood pressure at home as told by your doctor.  Keep all follow-up visits as told by your doctor. This is important. Medicines  Take over-the-counter  and prescription medicines only as told by your doctor. Follow directions carefully.  Do not skip doses of blood pressure medicine. The medicine does not work as well if you skip doses. Skipping doses also puts you at risk for problems.  Ask your doctor about side effects or reactions to medicines that you should watch for. Contact a doctor if you:  Think you are having a reaction to the medicine you are taking.  Have headaches that keep coming back (recurring).  Feel dizzy.  Have swelling in your ankles.  Have trouble with your vision. Get help right away if you:  Get a very bad headache.  Start to feel mixed up (confused).  Feel weak or numb.  Feel faint.  Have very bad pain in your: ? Chest. ? Belly (abdomen).  Throw up more than once.  Have trouble breathing. Summary  Hypertension is another name for high blood pressure.  High blood pressure forces your heart to work harder to pump blood.  For most people, a normal blood pressure is less than 120/80.  Making healthy choices can help lower blood pressure. If your blood pressure does not get lower with healthy choices, you may need to take medicine. This information is not intended to replace advice given to you by your health care provider. Make sure you discuss any questions you have with your health care provider. Document Revised: 07/21/2018 Document Reviewed: 07/21/2018 Elsevier Patient Education  [place]. https://www.cdc.gov/vaccines/hcp/vis/vis-statements/tdap.pdf">  Tdap (Tetanus, Diphtheria, Pertussis) Vaccine: What You Need to Know 1. Why get vaccinated? Tdap vaccine can prevent tetanus, diphtheria, and pertussis. Diphtheria and pertussis spread from person to person. Tetanus enters the body through cuts or wounds.  TETANUS (T) causes painful stiffening of the muscles. Tetanus can lead to serious health problems, including being unable to open the mouth, having trouble swallowing and  breathing, or death.  DIPHTHERIA (D) can lead to difficulty breathing, heart failure, paralysis, or death.  PERTUSSIS (aP), also known as "whooping cough," can cause uncontrollable, violent coughing which makes it hard to breathe, eat, or drink. Pertussis can be extremely serious in babies and young children, causing pneumonia, convulsions, brain damage, or death. In teens and adults, it can cause weight loss, loss of bladder control, passing out, and rib fractures from severe coughing. 2. Tdap vaccine Tdap is only for children 7 years and older, adolescents, and adults.  Adolescents should receive a single dose of Tdap, preferably at age 81 or 27 years. Pregnant women should get a dose of Tdap during every pregnancy, to protect the newborn from pertussis. Infants are most at risk for severe, life-threatening complications from pertussis. Adults who have never received Tdap should get a dose of Tdap. Also, adults should receive a booster dose every 10 years, or earlier in the case of a severe and dirty wound or burn. Booster doses can be either Tdap or Td (a different vaccine that protects against tetanus and diphtheria  but not pertussis). Tdap may be given at the same time as other vaccines. 3. Talk with your health care provider Tell your vaccine provider if the person getting the vaccine:  Has had an allergic reaction after a previous dose of any vaccine that protects against tetanus, diphtheria, or pertussis, or has any severe, life-threatening allergies.  Has had a coma, decreased level of consciousness, or prolonged seizures within 7 days after a previous dose of any pertussis vaccine (DTP, DTaP, or Tdap).  Has seizures or another nervous system problem.  Has ever had Guillain-Barr Syndrome (also called GBS).  Has had severe pain or swelling after a previous dose of any vaccine that protects against tetanus or diphtheria. In some cases, your health care provider may decide to postpone  Tdap vaccination to a future visit.  People with minor illnesses, such as a cold, may be vaccinated. People who are moderately or severely ill should usually wait until they recover before getting Tdap vaccine.  Your health care provider can give you more information. 4. Risks of a vaccine reaction  Pain, redness, or swelling where the shot was given, mild fever, headache, feeling tired, and nausea, vomiting, diarrhea, or stomachache sometimes happen after Tdap vaccine. People sometimes faint after medical procedures, including vaccination. Tell your provider if you feel dizzy or have vision changes or ringing in the ears.  As with any medicine, there is a very remote chance of a vaccine causing a severe allergic reaction, other serious injury, or death. 5. What if there is a serious problem? An allergic reaction could occur after the vaccinated person leaves the clinic. If you see signs of a severe allergic reaction (hives, swelling of the face and throat, difficulty breathing, a fast heartbeat, dizziness, or weakness), call 9-1-1 and get the person to the nearest hospital. For other signs that concern you, call your health care provider.  Adverse reactions should be reported to the Vaccine Adverse Event Reporting System (VAERS). Your health care provider will usually file this report, or you can do it yourself. Visit the VAERS website at www.vaers.SamedayNews.es or call 872-486-8863. VAERS is only for reporting reactions, and VAERS staff do not give medical advice. 6. The National Vaccine Injury Compensation Program The [employer] Vaccine Injury Compensation Program (VICP) is a federal program that was created to compensate people who may have been injured by certain vaccines. Visit the VICP website at GoldCloset.com.ee or call 308 422 3757 to learn about the program and about filing a claim. There is a time limit to file a claim for compensation. 7. How can I learn more?  Ask your health  care provider.  Call your local or state health department.  Contact the Centers for Disease Control and Prevention (CDC): ? Call 662-809-4534 (1-800-CDC-INFO) or ? Visit CDC's website at http://hunter.com/ Vaccine Information Statement Tdap (Tetanus, Diphtheria, Pertussis) Vaccine (02/23/2019) This information is not intended to replace advice given to you by your health care provider. Make sure you discuss any questions you have with your health care provider. Document Revised: 03/04/2019 Document Reviewed: 03/07/2019 Elsevier Patient Education  [place].

## 2020-04-09 NOTE — Progress Notes (Signed)
Established Patient Office Visit  Subjective:  Patient ID: Darren Pearson, male    DOB: 1966-03-03  Age: 54 y.o. MRN: [id number]  CC:  Chief Complaint  Patient presents with  . New Patient (Initial Visit)    hypertension   . Medication Refill    HPI  [name] is a 54 year old African American male  presents for management of hypertension. Denies shortness of breath, headaches, chest pain or lower extremity edema, sudden onset, vision changes, unilateral weakness, dizziness, paresthesias. He is having difficulty managing ADL' due to CVA and memory loss- cooking, -forgets to turn off stove or oven, difficulty bathing getting out the tub. Last seen cardiologist was in the hospital will refer.   Past Medical History:  Diagnosis Date  . Allergy   . Anxiety and depression   . Asthma   . Hepatitis C   . History of chicken pox   . Hypertension     Past Surgical History:  Procedure Laterality Date  . BUBBLE STUDY  11/22/2019   Procedure: BUBBLE STUDY;  Surgeon: Dorothy Spark, MD;  Location: [facility];  Service: Cardiovascular;;  . LOOP RECORDER INSERTION N/A 11/22/2019   Procedure: LOOP RECORDER INSERTION;  Surgeon: Thompson Grayer, MD;  Location: [facility];  Service: Cardiovascular;  Laterality: N/A;  . TEE WITHOUT CARDIOVERSION N/A 11/22/2019   Procedure: TRANSESOPHAGEAL ECHOCARDIOGRAM (TEE);  Surgeon: Dorothy Spark, MD;  Location: [facility];  Service: Cardiovascular;  Laterality: N/A;    Family History  Problem Relation Age of Onset  . Stomach cancer Mother   . Diabetes Father   . Paget's disease of bone Father     Social History   Socioeconomic History  . Marital status: Single    Spouse name: Not on file  . Number of children: Not on file  . Years of education: 63  . Highest education level: Not on file  Occupational History  . Occupation: Programmer, applications: [employer]  Tobacco Use  . Smoking status:  Former Smoker    Packs/day: 0.25    Types: Cigarettes    Start date: 11/24/2012  . Smokeless tobacco: Never Used  . Tobacco comment: has not smoked since 11/20/2019  Substance and Sexual Activity  . Alcohol use: Not Currently    Alcohol/week: 2.0 standard drinks    Types: 2 Cans of beer per week    Comment: social, states stopped on 11/20/2019.  Marland Kitchen Drug use: Not Currently    Frequency: 5.0 times per week    Types: Marijuana    Comment: Stop 11/20/2019  . Sexual activity: Yes    Birth control/protection: Condom  Other Topics Concern  . Not on file  Social History Narrative   Regular exercise-yes   Social Determinants of Health   Financial Resource Strain:   . Difficulty of Paying Living Expenses:   Food Insecurity:   . Worried About Charity fundraiser in the Last Year:   . Arboriculturist in the Last Year:   Transportation Needs: No Transportation Needs  . Lack of Transportation (Medical): No  . Lack of Transportation (Non-Medical): No  Physical Activity:   . Days of Exercise per Week:   . Minutes of Exercise per Session:   Stress:   . Feeling of Stress :   Social Connections:   . Frequency of Communication with Friends and Family:   . Frequency of Social Gatherings with Friends and Family:   .  Attends Religious Services:   . Active Member of Clubs or Organizations:   . Attends Archivist Meetings:   Marland Kitchen Marital Status:   Intimate Partner Violence:   . Fear of Current or Ex-Partner:   . Emotionally Abused:   Marland Kitchen Physically Abused:   . Sexually Abused:     Outpatient Medications Prior to Visit  Medication Sig Dispense Refill  . aspirin 81 MG EC tablet Take 1 tablet (81 mg total) by mouth daily. 90 tablet 0  . cyanocobalamin 1000 MCG tablet Take 1 tablet (1,000 mcg total) by mouth daily. 90 tablet 0  . acetaminophen (TYLENOL) 325 MG tablet Take 650 mg by mouth every 6 (six) hours as needed for mild pain or headache.    Marland Kitchen atorvastatin (LIPITOR) 40 MG tablet  Take 1 tablet (40 mg total) by mouth daily at 6 PM. 90 tablet 0  . lisinopril (ZESTRIL) 20 MG tablet Take 1 tablet (20 mg total) by mouth daily. 90 tablet 0   No facility-administered medications prior to visit.    Allergies  Allergen Reactions  . Sulfa Antibiotics Hives    ROS Review of Systems  Endocrine: Positive for polydipsia.  All other systems reviewed and are negative.     Objective:    Physical Exam  Constitutional: He is oriented to person, place, and time. He appears well-developed and well-nourished.  HENT:  Head: Normocephalic.  Eyes: Pupils are equal, round, and reactive to light. EOM are normal.  Cardiovascular: Normal rate and regular rhythm.  Pulmonary/Chest: Effort normal and breath sounds normal.  Abdominal: Bowel sounds are normal.  Musculoskeletal:        General: Normal range of motion.     Cervical back: Normal range of motion.     Comments: Left hand missing 2.5 digits 1/2 middle finger and removal of 1 and 2 digits  Neurological: He is alert and oriented to person, place, and time. He has normal reflexes.  Skin: Skin is warm and dry.  Psychiatric: He has a normal mood and affect. His behavior is normal. Judgment and thought content normal.    BP (!) 168/93 (BP Location: Right Arm, Patient Position: Sitting, Cuff Size: Normal)   Pulse 85   Temp (!) 97.3 F (36.3 C) (Temporal)   Ht 5\' 9"  (1.753 m)   Wt 155 lb 12.8 oz (70.7 kg)   SpO2 98%   BMI 23.01 kg/m  Wt Readings from Last 3 Encounters:  04/09/20 155 lb 12.8 oz (70.7 kg)  01/31/20 151 lb (68.5 kg)  12/28/19 154 lb (69.9 kg)     Health Maintenance Due  Topic Date Due  . TETANUS/TDAP  Never done  . COLONOSCOPY  Never done    There are no preventive care reminders to display for this patient.  Lab Results  Component Value Date   TSH 1.345 11/21/2019   Lab Results  Component Value Date   WBC 6.4 11/20/2019   HGB 12.2 (L) 11/20/2019   HCT 38.0 (L) 11/20/2019   MCV 95.5  11/20/2019   PLT 232 11/20/2019   Lab Results  Component Value Date   NA 140 11/21/2019   K 3.7 11/21/2019   CO2 27 11/21/2019   GLUCOSE 95 11/21/2019   BUN 10 11/21/2019   CREATININE 1.19 11/21/2019   BILITOT 1.0 11/20/2019   ALKPHOS 89 11/20/2019   AST 24 11/20/2019   ALT 13 11/20/2019   PROT 8.0 11/20/2019   ALBUMIN 3.7 11/20/2019   CALCIUM 8.7 (L) 11/21/2019  ANIONGAP 7 11/21/2019   GFR 142.93 04/12/2013   Lab Results  Component Value Date   CHOL 139 11/22/2019   Lab Results  Component Value Date   HDL 54 11/22/2019   Lab Results  Component Value Date   LDLCALC 74 11/22/2019   Lab Results  Component Value Date   TRIG 53 11/22/2019   Lab Results  Component Value Date   CHOLHDL 2.6 11/22/2019   Lab Results  Component Value Date   HGBA1C 4.2 (L) 11/22/2019      Assessment & Plan:  Traylen was seen today for new patient (initial visit) and medication refill.  Diagnoses and all orders for this visit:  Need for Tdap vaccination Tdap is recommended every 10 years for adults   Recommend by the CDC. -     Tdap vaccine greater than or equal to 7yo IM  Cerebrovascular accident (CVA), unspecified mechanism ([place]) Followed by neurology CT scan showed diffuse cerebral atrophy and bilateral changes of white matter disease and left pontine remote age lacunar infarct. MRI scan showed acute and subacute punctate bilateral cortical as well as left corona radiata as well as right medial pontine infarcts and remote age lacunar infarcts in the right caudate head, right cerebellum. CT angiogram of the head and neck did not show any large vessel occlusion but showed only a small left paraclinoid ICA aneurysm. Currently on ASA 81mg  daily and out of cholesterol medication.  Accelerated hypertension Counseled on blood pressure goal of less than 130/80, low-sodium, DASH diet, medication compliance, 150 minutes of moderate intensity exercise per week. Discussed medication  compliance, adverse effects. Medication changes noted below -     amLODipine (NORVASC) 10 MG tablet; Take 1 tablet (10 mg total) by mouth daily. -     lisinopril-hydrochlorothiazide (ZESTORETIC) 20-25 MG tablet; Take 1 tablet by mouth daily.  Tobacco abuse counseling He is aware and understands increased risk for lung cancer and other respiratory diseases recommend cessation.  This will be reminded at each clinical visit.     Meds ordered this encounter  Medications  . amLODipine (NORVASC) 10 MG tablet    Sig: Take 1 tablet (10 mg total) by mouth daily.    Dispense:  90 tablet    Refill:  3  . lisinopril-hydrochlorothiazide (ZESTORETIC) 20-25 MG tablet    Sig: Take 1 tablet by mouth daily.    Dispense:  90 tablet    Refill:  3    Follow-up: Return in about 4 weeks (around 05/07/2020) for  Bp follow up and fasting labs- AM.    [name], NP

## 2020-04-20 ENCOUNTER — Telehealth: Payer: Self-pay

## 2020-04-20 NOTE — Telephone Encounter (Signed)
At request of [name], NP call placed to patient multiple times to discuss insurance questions/financial counseling. The voicemail is in [place]. Bonnita Nasuti, RN placed call to patient and left message in Spanish requesting patient return the call.    No return call from patient.  Letter sent to him instructing him to contact this CM.

## 2020-04-25 DIAGNOSIS — Z0271 Encounter for disability determination: Secondary | ICD-10-CM

## 2020-05-09 ENCOUNTER — Emergency Department (HOSPITAL_COMMUNITY): Payer: Medicaid Other

## 2020-05-09 ENCOUNTER — Inpatient Hospital Stay (HOSPITAL_COMMUNITY)
Admission: EM | Admit: 2020-05-09 | Discharge: 2020-05-25 | DRG: 064 | Disposition: A | Discharge status: Skilled Nursing Facility | Payer: Medicaid Other | Attending: Internal Medicine | Admitting: Internal Medicine

## 2020-05-09 ENCOUNTER — Inpatient Hospital Stay (HOSPITAL_COMMUNITY): Payer: Medicaid Other

## 2020-05-09 ENCOUNTER — Encounter (HOSPITAL_COMMUNITY): Payer: Self-pay | Admitting: Emergency Medicine

## 2020-05-09 ENCOUNTER — Other Ambulatory Visit: Payer: Self-pay

## 2020-05-09 DIAGNOSIS — E876 Hypokalemia: Secondary | ICD-10-CM | POA: Diagnosis present

## 2020-05-09 DIAGNOSIS — I671 Cerebral aneurysm, nonruptured: Secondary | ICD-10-CM | POA: Diagnosis present

## 2020-05-09 DIAGNOSIS — D631 Anemia in chronic kidney disease: Secondary | ICD-10-CM | POA: Diagnosis present

## 2020-05-09 DIAGNOSIS — I130 Hypertensive heart and chronic kidney disease with heart failure and stage 1 through stage 4 chronic kidney disease, or unspecified chronic kidney disease: Secondary | ICD-10-CM | POA: Diagnosis present

## 2020-05-09 DIAGNOSIS — I6340 Cerebral infarction due to embolism of unspecified cerebral artery: Secondary | ICD-10-CM | POA: Diagnosis present

## 2020-05-09 DIAGNOSIS — Z8673 Personal history of transient ischemic attack (TIA), and cerebral infarction without residual deficits: Secondary | ICD-10-CM

## 2020-05-09 DIAGNOSIS — R1310 Dysphagia, unspecified: Secondary | ICD-10-CM | POA: Diagnosis present

## 2020-05-09 DIAGNOSIS — I69320 Aphasia following cerebral infarction: Secondary | ICD-10-CM

## 2020-05-09 DIAGNOSIS — Z20822 Contact with and (suspected) exposure to covid-19: Secondary | ICD-10-CM | POA: Diagnosis present

## 2020-05-09 DIAGNOSIS — D62 Acute posthemorrhagic anemia: Secondary | ICD-10-CM | POA: Diagnosis present

## 2020-05-09 DIAGNOSIS — I248 Other forms of acute ischemic heart disease: Secondary | ICD-10-CM | POA: Diagnosis present

## 2020-05-09 DIAGNOSIS — Z7982 Long term (current) use of aspirin: Secondary | ICD-10-CM

## 2020-05-09 DIAGNOSIS — G9341 Metabolic encephalopathy: Secondary | ICD-10-CM | POA: Diagnosis not present

## 2020-05-09 DIAGNOSIS — I69398 Other sequelae of cerebral infarction: Secondary | ICD-10-CM

## 2020-05-09 DIAGNOSIS — G8191 Hemiplegia, unspecified affecting right dominant side: Secondary | ICD-10-CM | POA: Diagnosis present

## 2020-05-09 DIAGNOSIS — R29720 NIHSS score 20: Secondary | ICD-10-CM | POA: Diagnosis present

## 2020-05-09 DIAGNOSIS — R4182 Altered mental status, unspecified: Secondary | ICD-10-CM | POA: Diagnosis not present

## 2020-05-09 DIAGNOSIS — R270 Ataxia, unspecified: Secondary | ICD-10-CM | POA: Diagnosis present

## 2020-05-09 DIAGNOSIS — R778 Other specified abnormalities of plasma proteins: Secondary | ICD-10-CM | POA: Diagnosis not present

## 2020-05-09 DIAGNOSIS — R404 Transient alteration of awareness: Secondary | ICD-10-CM | POA: Diagnosis present

## 2020-05-09 DIAGNOSIS — N182 Chronic kidney disease, stage 2 (mild): Secondary | ICD-10-CM | POA: Diagnosis present

## 2020-05-09 DIAGNOSIS — F329 Major depressive disorder, single episode, unspecified: Secondary | ICD-10-CM | POA: Diagnosis present

## 2020-05-09 DIAGNOSIS — R4189 Other symptoms and signs involving cognitive functions and awareness: Secondary | ICD-10-CM | POA: Diagnosis present

## 2020-05-09 DIAGNOSIS — R7989 Other specified abnormal findings of blood chemistry: Secondary | ICD-10-CM | POA: Diagnosis present

## 2020-05-09 DIAGNOSIS — I5022 Chronic systolic (congestive) heart failure: Secondary | ICD-10-CM | POA: Diagnosis present

## 2020-05-09 DIAGNOSIS — G92 Toxic encephalopathy: Secondary | ICD-10-CM | POA: Diagnosis present

## 2020-05-09 DIAGNOSIS — B1920 Unspecified viral hepatitis C without hepatic coma: Secondary | ICD-10-CM | POA: Diagnosis present

## 2020-05-09 DIAGNOSIS — Z89022 Acquired absence of left finger(s): Secondary | ICD-10-CM

## 2020-05-09 DIAGNOSIS — I69354 Hemiplegia and hemiparesis following cerebral infarction affecting left non-dominant side: Secondary | ICD-10-CM | POA: Diagnosis not present

## 2020-05-09 DIAGNOSIS — N179 Acute kidney failure, unspecified: Secondary | ICD-10-CM | POA: Diagnosis present

## 2020-05-09 DIAGNOSIS — D649 Anemia, unspecified: Secondary | ICD-10-CM | POA: Diagnosis present

## 2020-05-09 DIAGNOSIS — Z9114 Patient's other noncompliance with medication regimen: Secondary | ICD-10-CM

## 2020-05-09 DIAGNOSIS — F419 Anxiety disorder, unspecified: Secondary | ICD-10-CM | POA: Diagnosis present

## 2020-05-09 DIAGNOSIS — E785 Hyperlipidemia, unspecified: Secondary | ICD-10-CM | POA: Diagnosis present

## 2020-05-09 DIAGNOSIS — Z79899 Other long term (current) drug therapy: Secondary | ICD-10-CM

## 2020-05-09 DIAGNOSIS — B86 Scabies: Secondary | ICD-10-CM | POA: Diagnosis present

## 2020-05-09 DIAGNOSIS — J302 Other seasonal allergic rhinitis: Secondary | ICD-10-CM | POA: Diagnosis present

## 2020-05-09 DIAGNOSIS — I639 Cerebral infarction, unspecified: Secondary | ICD-10-CM | POA: Diagnosis present

## 2020-05-09 DIAGNOSIS — Z87891 Personal history of nicotine dependence: Secondary | ICD-10-CM

## 2020-05-09 DIAGNOSIS — D509 Iron deficiency anemia, unspecified: Secondary | ICD-10-CM | POA: Diagnosis not present

## 2020-05-09 DIAGNOSIS — I739 Peripheral vascular disease, unspecified: Secondary | ICD-10-CM | POA: Diagnosis present

## 2020-05-09 DIAGNOSIS — E538 Deficiency of other specified B group vitamins: Secondary | ICD-10-CM | POA: Diagnosis present

## 2020-05-09 DIAGNOSIS — E7220 Disorder of urea cycle metabolism, unspecified: Secondary | ICD-10-CM | POA: Diagnosis not present

## 2020-05-09 DIAGNOSIS — Z882 Allergy status to sulfonamides status: Secondary | ICD-10-CM

## 2020-05-09 LAB — URINALYSIS, ROUTINE W REFLEX MICROSCOPIC
Bilirubin Urine: NEGATIVE
Glucose, UA: NEGATIVE mg/dL
Ketones, ur: NEGATIVE mg/dL
Leukocytes,Ua: NEGATIVE
Nitrite: NEGATIVE
Protein, ur: NEGATIVE mg/dL
Specific Gravity, Urine: 1.033 — ABNORMAL HIGH (ref 1.005–1.030)
pH: 6 (ref 5.0–8.0)

## 2020-05-09 LAB — CBC WITH DIFFERENTIAL/PLATELET
Abs Immature Granulocytes: 0.04 10*3/uL (ref 0.00–0.07)
Basophils Absolute: 0 10*3/uL (ref 0.0–0.1)
Basophils Relative: 0 %
Eosinophils Absolute: 0.1 10*3/uL (ref 0.0–0.5)
Eosinophils Relative: 1 %
HCT: 21.6 % — ABNORMAL LOW (ref 39.0–52.0)
Hemoglobin: 6 g/dL — CL (ref 13.0–17.0)
Immature Granulocytes: 1 %
Lymphocytes Relative: 9 %
Lymphs Abs: 0.8 10*3/uL (ref 0.7–4.0)
MCH: 21.4 pg — ABNORMAL LOW (ref 26.0–34.0)
MCHC: 27.8 g/dL — ABNORMAL LOW (ref 30.0–36.0)
MCV: 77.1 fL — ABNORMAL LOW (ref 80.0–100.0)
Monocytes Absolute: 0.6 10*3/uL (ref 0.1–1.0)
Monocytes Relative: 7 %
Neutro Abs: 6.9 10*3/uL (ref 1.7–7.7)
Neutrophils Relative %: 82 %
Platelets: 491 10*3/uL — ABNORMAL HIGH (ref 150–400)
RBC: 2.8 MIL/uL — ABNORMAL LOW (ref 4.22–5.81)
RDW: 16.4 % — ABNORMAL HIGH (ref 11.5–15.5)
WBC: 8.4 10*3/uL (ref 4.0–10.5)
nRBC: 0 % (ref 0.0–0.2)

## 2020-05-09 LAB — COMPREHENSIVE METABOLIC PANEL
ALT: 14 U/L (ref 0–44)
AST: 23 U/L (ref 15–41)
Albumin: 3.5 g/dL (ref 3.5–5.0)
Alkaline Phosphatase: 67 U/L (ref 38–126)
Anion gap: 13 (ref 5–15)
BUN: 23 mg/dL — ABNORMAL HIGH (ref 6–20)
CO2: 20 mmol/L — ABNORMAL LOW (ref 22–32)
Calcium: 9 mg/dL (ref 8.9–10.3)
Chloride: 108 mmol/L (ref 98–111)
Creatinine, Ser: 2.47 mg/dL — ABNORMAL HIGH (ref 0.61–1.24)
GFR calc Af Amer: 33 mL/min — ABNORMAL LOW (ref >60)
GFR calc non Af Amer: 28 mL/min — ABNORMAL LOW (ref >60)
Glucose, Bld: 88 mg/dL (ref 70–99)
Potassium: 3.3 mmol/L — ABNORMAL LOW (ref 3.5–5.1)
Sodium: 141 mmol/L (ref 135–145)
Total Bilirubin: 0.5 mg/dL (ref 0.3–1.2)
Total Protein: 8.5 g/dL — ABNORMAL HIGH (ref 6.5–8.1)

## 2020-05-09 LAB — POC OCCULT BLOOD, ED: Fecal Occult Bld: NEGATIVE

## 2020-05-09 LAB — ABO/RH: ABO/RH(D): A POS

## 2020-05-09 LAB — RAPID URINE DRUG SCREEN, HOSP PERFORMED
Amphetamines: NOT DETECTED
Barbiturates: NOT DETECTED
Benzodiazepines: NOT DETECTED
Cocaine: NOT DETECTED
Opiates: NOT DETECTED
Tetrahydrocannabinol: NOT DETECTED

## 2020-05-09 LAB — TSH
TSH: 1.967 u[IU]/mL (ref 0.350–4.500)
TSH: 1.296 u[IU]/mL (ref 0.350–4.500)

## 2020-05-09 LAB — CBG MONITORING, ED: Glucose-Capillary: 96 mg/dL (ref 70–99)

## 2020-05-09 LAB — I-STAT CHEM 8, ED
BUN: 26 mg/dL — ABNORMAL HIGH (ref 6–20)
Calcium, Ion: 0.95 mmol/L — ABNORMAL LOW (ref 1.15–1.40)
Chloride: 109 mmol/L (ref 98–111)
Creatinine, Ser: 2.6 mg/dL — ABNORMAL HIGH (ref 0.61–1.24)
Glucose, Bld: 84 mg/dL (ref 70–99)
HCT: 21 % — ABNORMAL LOW (ref 39.0–52.0)
Hemoglobin: 7.1 g/dL — ABNORMAL LOW (ref 13.0–17.0)
Potassium: 3.3 mmol/L — ABNORMAL LOW (ref 3.5–5.1)
Sodium: 144 mmol/L (ref 135–145)
TCO2: 21 mmol/L — ABNORMAL LOW (ref 22–32)

## 2020-05-09 LAB — CK: Total CK: 180 U/L (ref 49–397)

## 2020-05-09 LAB — TROPONIN I (HIGH SENSITIVITY)
Troponin I (High Sensitivity): 41 ng/L — ABNORMAL HIGH (ref <18)
Troponin I (High Sensitivity): 96 ng/L — ABNORMAL HIGH (ref <18)

## 2020-05-09 LAB — VITAMIN B12: Vitamin B-12: 1349 pg/mL — ABNORMAL HIGH (ref 180–914)

## 2020-05-09 LAB — ETHANOL: Alcohol, Ethyl (B): <10 mg/dL (ref <10)

## 2020-05-09 LAB — PROTIME-INR
INR: 1.1 (ref 0.8–1.2)
Prothrombin Time: 14.2 seconds (ref 11.4–15.2)

## 2020-05-09 LAB — AMMONIA: Ammonia: 39 umol/L — ABNORMAL HIGH (ref 9–35)

## 2020-05-09 LAB — PREPARE RBC (CROSSMATCH)

## 2020-05-09 LAB — SARS CORONAVIRUS 2 BY RT PCR (HOSPITAL ORDER, PERFORMED IN ~~LOC~~ HOSPITAL LAB): SARS Coronavirus 2: NEGATIVE

## 2020-05-09 LAB — APTT: aPTT: 27 seconds (ref 24–36)

## 2020-05-09 MED ORDER — SODIUM CHLORIDE 0.9 % IV SOLN: Freq: Once | INTRAVENOUS | Status: AC

## 2020-05-09 MED ORDER — SODIUM CHLORIDE 0.9% FLUSH
3.0000 mL | Freq: Two times a day (BID) | INTRAVENOUS | Status: DC
  Administered 2020-05-09 – 2020-05-25 (×31): 3 mL via INTRAVENOUS

## 2020-05-09 MED ORDER — HYDROCORTISONE 1 % EX CREA
1.0000 "application " | TOPICAL_CREAM | Freq: Two times a day (BID) | CUTANEOUS | Status: DC | PRN
  Filled 2020-05-09: qty 28

## 2020-05-09 MED ORDER — POTASSIUM CHLORIDE 10 MEQ/100ML IV SOLN
10.0000 meq | INTRAVENOUS | Status: AC
  Administered 2020-05-09 (×3): 10 meq via INTRAVENOUS
  Filled 2020-05-09: qty 100

## 2020-05-09 MED ORDER — ACETAMINOPHEN 650 MG RE SUPP: 650.0000 mg | Freq: Four times a day (QID) | RECTAL | Status: DC | PRN

## 2020-05-09 MED ORDER — ALBUTEROL SULFATE (2.5 MG/3ML) 0.083% IN NEBU: 2.5000 mg | INHALATION_SOLUTION | Freq: Four times a day (QID) | RESPIRATORY_TRACT | Status: DC | PRN

## 2020-05-09 MED ORDER — ACETAMINOPHEN 325 MG PO TABS: 650.0000 mg | ORAL_TABLET | Freq: Four times a day (QID) | ORAL | Status: DC | PRN

## 2020-05-09 MED ORDER — SODIUM CHLORIDE 0.9% IV SOLUTION: Freq: Once | INTRAVENOUS | Status: AC

## 2020-05-09 MED ORDER — PERMETHRIN 5 % EX CREA
1.0000 "application " | TOPICAL_CREAM | Freq: Once | CUTANEOUS | Status: DC
  Filled 2020-05-09: qty 60

## 2020-05-09 MED ORDER — IOHEXOL 350 MG/ML SOLN
100.0000 mL | Freq: Once | INTRAVENOUS | Status: AC | PRN
  Administered 2020-05-09: 100 mL via INTRAVENOUS

## 2020-05-09 NOTE — ED Triage Notes (Signed)
Pt here from home found outside sitting looking to the left , pt hx of stroke with left sided weakness , cbg 190 , 300 of fluid by ems ,

## 2020-05-09 NOTE — ED Notes (Signed)
Pt returned from MRI °

## 2020-05-09 NOTE — ED Provider Notes (Signed)
[facility] EMERGENCY DEPARTMENT Provider Note   CSN: [id number] Arrival date & time: 05/09/20  1129     History No chief complaint on file.   Darren Pearson is a 54 y.o. male.  Presents to ER with concern for altered mental status.  Level 5 caveat history limited due to altered mental status.  Additional history obtained from chart review, sister, EMS report.  Patient has been feeling generally unwell for the last few weeks, has not been taking care of himself, not taking his regular medicines routinely.  Sister said that this morning, she found him unresponsive lying outside on the ground.  Last time she saw him normal was yesterday afternoon on the couch.   HPI     Past Medical History:  Diagnosis Date  . Allergy   . Anxiety and depression   . Asthma   . Hepatitis C   . History of chicken pox   . Hypertension     Patient Active Problem List   Diagnosis Date Noted  . Cerebrovascular accident (CVA) ([place]) 11/20/2019  . Hypokalemia 11/20/2019  . Accelerated hypertension 11/20/2019  . Hyperammonemia ([place]) 11/20/2019  . Elevated serum creatinine 11/20/2019  . Elevated blood pressure 03/15/2015  . Rash and nonspecific skin eruption 03/15/2015  . Hepatitis C 04/12/2013  . Allergic rhinitis 04/12/2013  . Unspecified asthma(493.90) 04/12/2013    Past Surgical History:  Procedure Laterality Date  . BUBBLE STUDY  11/22/2019   Procedure: BUBBLE STUDY;  Surgeon: Dorothy Spark, MD;  Location: [facility];  Service: Cardiovascular;;  . LOOP RECORDER INSERTION N/A 11/22/2019   Procedure: LOOP RECORDER INSERTION;  Surgeon: Thompson Grayer, MD;  Location: [facility];  Service: Cardiovascular;  Laterality: N/A;  . TEE WITHOUT CARDIOVERSION N/A 11/22/2019   Procedure: TRANSESOPHAGEAL ECHOCARDIOGRAM (TEE);  Surgeon: Dorothy Spark, MD;  Location: [facility] ENDOSCOPY;  Service: Cardiovascular;  Laterality: N/A;       Family History  Problem Relation  Age of Onset  . Stomach cancer Mother   . Diabetes Father   . Paget's disease of bone Father     Social History   Tobacco Use  . Smoking status: Former Smoker    Packs/day: 0.25    Types: Cigarettes    Start date: 11/24/2012  . Smokeless tobacco: Never Used  . Tobacco comment: has not smoked since 11/20/2019  Vaping Use  . Vaping Use: Former  Substance Use Topics  . Alcohol use: Not Currently    Alcohol/week: 2.0 standard drinks    Types: 2 Cans of beer per week    Comment: social, states stopped on 11/20/2019.  Marland Kitchen Drug use: Not Currently    Frequency: 5.0 times per week    Types: Marijuana    Comment: Stop 11/20/2019    Home Medications Prior to Admission medications   Medication Sig Start Date End Date Taking? Authorizing Provider  acetaminophen (TYLENOL) 325 MG tablet Take 650 mg by mouth every 6 (six) hours as needed for mild pain or headache.    [provider]  amLODipine (NORVASC) 10 MG tablet Take 1 tablet (10 mg total) by mouth daily. 04/09/20   [name], NP  aspirin 81 MG EC tablet Take 1 tablet (81 mg total) by mouth daily. 11/24/19   Kathie Dike, MD  atorvastatin (LIPITOR) 40 MG tablet Take 1 tablet (40 mg total) by mouth daily at 6 PM. 11/23/19   Kathie Dike, MD  cyanocobalamin 1000 MCG tablet Take 1 tablet (1,000  mcg total) by mouth daily. 11/24/19   Kathie Dike, MD  lisinopril-hydrochlorothiazide (ZESTORETIC) 20-25 MG tablet Take 1 tablet by mouth daily. 04/09/20   [name], NP    Allergies    Sulfa antibiotics  Review of Systems   Review of Systems  Unable to perform ROS: Mental status change    Physical Exam Updated Vital Signs BP (!) 146/79 (BP Location: Left Arm)   Pulse (!) 110   Temp 98.3 F (36.8 C) (Oral)   Resp 20   SpO2 100%   Physical Exam Vitals and nursing note reviewed. Exam conducted with a chaperone present.  Constitutional:      Appearance: He is well-developed.  HENT:     Head:  Normocephalic and atraumatic.  Eyes:     Conjunctiva/sclera: Conjunctivae normal.     Pupils: Pupils are equal, round, and reactive to light.  Cardiovascular:     Rate and Rhythm: Normal rate and regular rhythm.     Heart sounds: No murmur heard.   Pulmonary:     Effort: Pulmonary effort is normal. No respiratory distress.     Breath sounds: Normal breath sounds.  Abdominal:     Palpations: Abdomen is soft.     Tenderness: There is no abdominal tenderness.  Genitourinary:    Penis: Normal.      Rectum: Normal.     Comments: Brown stool Musculoskeletal:        General: No deformity or signs of injury.     Cervical back: Neck supple.  Skin:    General: Skin is warm and dry.     Capillary Refill: Capillary refill takes less than 2 seconds.  Neurological:     Mental Status: He is alert.     Comments: Opens eyes spontaneously, follows some commands, confused verbal response, moves all 4 extremities  Psychiatric:        Mood and Affect: Mood normal.        Behavior: Behavior normal.     ED Results / Procedures / Treatments   Labs (all labs ordered are listed, but only abnormal results are displayed) Labs Reviewed  COMPREHENSIVE METABOLIC PANEL - Abnormal; Notable for the following components:      Result Value   Potassium 3.3 (*)    CO2 20 (*)    BUN 23 (*)    Creatinine, Ser 2.47 (*)    Total Protein 8.5 (*)    GFR calc non Af Amer 28 (*)    GFR calc Af Amer 33 (*)    All other components within normal limits  CBC WITH DIFFERENTIAL/PLATELET - Abnormal; Notable for the following components:   RBC 2.80 (*)    Hemoglobin 6.0 (*)    HCT 21.6 (*)    MCV 77.1 (*)    MCH 21.4 (*)    MCHC 27.8 (*)    RDW 16.4 (*)    Platelets 491 (*)    All other components within normal limits  AMMONIA - Abnormal; Notable for the following components:   Ammonia 39 (*)    All other components within normal limits  VITAMIN B12 - Abnormal; Notable for the following components:   Vitamin  B-12 1,349 (*)    All other components within normal limits  I-STAT CHEM 8, ED - Abnormal; Notable for the following components:   Potassium 3.3 (*)    BUN 26 (*)    Creatinine, Ser 2.60 (*)    Calcium, Ion 0.95 (*)    TCO2 21 (*)  Hemoglobin 7.1 (*)    HCT 21.0 (*)    All other components within normal limits  TROPONIN I (HIGH SENSITIVITY) - Abnormal; Notable for the following components:   Troponin I (High Sensitivity) 41 (*)    All other components within normal limits  SARS CORONAVIRUS 2 BY RT PCR (HOSPITAL ORDER, [place])  ETHANOL  PROTIME-INR  APTT  TSH  CBC  DIFFERENTIAL  RAPID URINE DRUG SCREEN, HOSP PERFORMED  URINALYSIS, ROUTINE W REFLEX MICROSCOPIC  CBG MONITORING, ED  POC OCCULT BLOOD, ED  TYPE AND SCREEN  PREPARE RBC (CROSSMATCH)  ABO/RH  TROPONIN I (HIGH SENSITIVITY)    EKG EKG Interpretation  Date/Time:  Wednesday May 09 2020 11:38:20 EDT Ventricular Rate:  112 PR Interval:    QRS Duration: 144 QT Interval:  374 QTC Calculation: 511 R Axis:   100 Text Interpretation: Sinus tachycardia RBBB and LPFB Probable inferior infarct, old Lateral leads are also involved Confirmed by Madalyn Rob [id number]) on 05/09/2020 12:02:28 PM   Radiology CT Code Stroke CTA Head W/WO contrast  Result Date: 05/09/2020 CLINICAL DATA:  Unable to speak.  Stroke. EXAM: CT ANGIOGRAPHY HEAD AND NECK CT PERFUSION BRAIN TECHNIQUE: Multidetector CT imaging of the head and neck was performed using the standard protocol during bolus administration of intravenous contrast. Multiplanar CT image reconstructions and MIPs were obtained to evaluate the vascular anatomy. Carotid stenosis measurements (when applicable) are obtained utilizing NASCET criteria, using the distal internal carotid diameter as the denominator. Multiphase CT imaging of the brain was performed following IV bolus contrast injection. Subsequent parametric perfusion maps were calculated using  RAPID software. CONTRAST:  170mL OMNIPAQUE IOHEXOL 350 MG/ML SOLN COMPARISON:  CT head 05/09/2020.  CTA head 11/21/2019 FINDINGS: CTA NECK FINDINGS Aortic arch: Standard branching. Imaged portion shows no evidence of aneurysm or dissection. No significant stenosis of the major arch vessel origins. Right carotid system: Right carotid widely patent. Minimal atherosclerotic disease right carotid bulb. Left carotid system: Left carotid widely patent. Minimal atherosclerotic disease left carotid bulb. Vertebral arteries: Right vertebral artery dominant. Moderate stenosis proximal right vertebral artery unchanged. Remainder of the right vertebral artery is patent. Small left vertebral artery ends in PICA without focal stenosis. Skeleton: Cervical spondylosis.  No acute skeletal abnormality. Other neck: Negative for mass or adenopathy in the neck. Upper chest: Prominent apical blebs and emphysema. No acute abnormality. Review of the MIP images confirms the above findings CTA HEAD FINDINGS Anterior circulation: Mild atherosclerotic calcification in the cavernous carotid bilaterally. 2 mm left paraclinoid aneurysm projecting laterally is unchanged. Anterior and middle cerebral arteries widely patent without significant stenosis or occlusion. Posterior circulation: Right vertebral artery supplies the basilar which is patent. Right PICA patent. Left vertebral artery is small and ends in PICA. Superior cerebellar and posterior cerebral arteries patent bilaterally without stenosis. Fetal origin right posterior cerebral artery. No aneurysm in the posterior circulation. Venous sinuses: Normal venous enhancement Anatomic variants: None Review of the MIP images confirms the above findings CT Brain Perfusion Findings: ASPECTS: 10 CBF (<30%) Volume: 1mL Perfusion (Tmax>6.0s) volume: 45mL Mismatch Volume: 73mL Infarction Location:None IMPRESSION: 1. CT perfusion negative for acute ischemia or infarction 2. Negative for intracranial large  vessel occlusion or flow limiting stenosis 3. Both carotid arteries are widely patent in the neck with minimal atherosclerotic disease. 4. Moderate stenosis proximal right vertebral artery which is dominant. Small left vertebral artery ends in PICA 5. 2 mm left para clinoid internal carotid artery aneurysm unchanged from the  prior CTA. Electronically Signed   By: Franchot Gallo M.D.   On: 05/09/2020 12:27   CT Code Stroke CTA Neck W/WO contrast  Result Date: 05/09/2020 CLINICAL DATA:  Unable to speak.  Stroke. EXAM: CT ANGIOGRAPHY HEAD AND NECK CT PERFUSION BRAIN TECHNIQUE: Multidetector CT imaging of the head and neck was performed using the standard protocol during bolus administration of intravenous contrast. Multiplanar CT image reconstructions and MIPs were obtained to evaluate the vascular anatomy. Carotid stenosis measurements (when applicable) are obtained utilizing NASCET criteria, using the distal internal carotid diameter as the denominator. Multiphase CT imaging of the brain was performed following IV bolus contrast injection. Subsequent parametric perfusion maps were calculated using RAPID software. CONTRAST:  173mL OMNIPAQUE IOHEXOL 350 MG/ML SOLN COMPARISON:  CT head 05/09/2020.  CTA head 11/21/2019 FINDINGS: CTA NECK FINDINGS Aortic arch: Standard branching. Imaged portion shows no evidence of aneurysm or dissection. No significant stenosis of the major arch vessel origins. Right carotid system: Right carotid widely patent. Minimal atherosclerotic disease right carotid bulb. Left carotid system: Left carotid widely patent. Minimal atherosclerotic disease left carotid bulb. Vertebral arteries: Right vertebral artery dominant. Moderate stenosis proximal right vertebral artery unchanged. Remainder of the right vertebral artery is patent. Small left vertebral artery ends in PICA without focal stenosis. Skeleton: Cervical spondylosis.  No acute skeletal abnormality. Other neck: Negative for mass or  adenopathy in the neck. Upper chest: Prominent apical blebs and emphysema. No acute abnormality. Review of the MIP images confirms the above findings CTA HEAD FINDINGS Anterior circulation: Mild atherosclerotic calcification in the cavernous carotid bilaterally. 2 mm left paraclinoid aneurysm projecting laterally is unchanged. Anterior and middle cerebral arteries widely patent without significant stenosis or occlusion. Posterior circulation: Right vertebral artery supplies the basilar which is patent. Right PICA patent. Left vertebral artery is small and ends in PICA. Superior cerebellar and posterior cerebral arteries patent bilaterally without stenosis. Fetal origin right posterior cerebral artery. No aneurysm in the posterior circulation. Venous sinuses: Normal venous enhancement Anatomic variants: None Review of the MIP images confirms the above findings CT Brain Perfusion Findings: ASPECTS: 10 CBF (<30%) Volume: 101mL Perfusion (Tmax>6.0s) volume: 55mL Mismatch Volume: 41mL Infarction Location:None IMPRESSION: 1. CT perfusion negative for acute ischemia or infarction 2. Negative for intracranial large vessel occlusion or flow limiting stenosis 3. Both carotid arteries are widely patent in the neck with minimal atherosclerotic disease. 4. Moderate stenosis proximal right vertebral artery which is dominant. Small left vertebral artery ends in PICA 5. 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 Electronically Signed   By: Franchot Gallo M.D.   On: 05/09/2020 12:27   CT Code Stroke Cerebral Perfusion with contrast  Result Date: 05/09/2020 CLINICAL DATA:  Unable to speak.  Stroke. EXAM: CT ANGIOGRAPHY HEAD AND NECK CT PERFUSION BRAIN TECHNIQUE: Multidetector CT imaging of the head and neck was performed using the standard protocol during bolus administration of intravenous contrast. Multiplanar CT image reconstructions and MIPs were obtained to evaluate the vascular  anatomy. Carotid stenosis measurements (when applicable) are obtained utilizing NASCET criteria, using the distal internal carotid diameter as the denominator. Multiphase CT imaging of the brain was performed following IV bolus contrast injection. Subsequent parametric perfusion maps were calculated using RAPID software. CONTRAST:  16mL OMNIPAQUE IOHEXOL 350 MG/ML SOLN COMPARISON:  CT head 05/09/2020.  CTA head 11/21/2019 FINDINGS: CTA NECK FINDINGS Aortic arch: Standard branching. Imaged portion shows no evidence of aneurysm or dissection. No significant stenosis of the  major arch vessel origins. Right carotid system: Right carotid widely patent. Minimal atherosclerotic disease right carotid bulb. Left carotid system: Left carotid widely patent. Minimal atherosclerotic disease left carotid bulb. Vertebral arteries: Right vertebral artery dominant. Moderate stenosis proximal right vertebral artery unchanged. Remainder of the right vertebral artery is patent. Small left vertebral artery ends in PICA without focal stenosis. Skeleton: Cervical spondylosis.  No acute skeletal abnormality. Other neck: Negative for mass or adenopathy in the neck. Upper chest: Prominent apical blebs and emphysema. No acute abnormality. Review of the MIP images confirms the above findings CTA HEAD FINDINGS Anterior circulation: Mild atherosclerotic calcification in the cavernous carotid bilaterally. 2 mm left paraclinoid aneurysm projecting laterally is unchanged. Anterior and middle cerebral arteries widely patent without significant stenosis or occlusion. Posterior circulation: Right vertebral artery supplies the basilar which is patent. Right PICA patent. Left vertebral artery is small and ends in PICA. Superior cerebellar and posterior cerebral arteries patent bilaterally without stenosis. Fetal origin right posterior cerebral artery. No aneurysm in the posterior circulation. Venous sinuses: Normal venous enhancement Anatomic variants:  None Review of the MIP images confirms the above findings CT Brain Perfusion Findings: ASPECTS: 10 CBF (<30%) Volume: 59mL Perfusion (Tmax>6.0s) volume: 29mL Mismatch Volume: 93mL Infarction Location:None IMPRESSION: 1. CT perfusion negative for acute ischemia or infarction 2. Negative for intracranial large vessel occlusion or flow limiting stenosis 3. Both carotid arteries are widely patent in the neck with minimal atherosclerotic disease. 4. Moderate stenosis proximal right vertebral artery which is dominant. Small left vertebral artery ends in PICA 5. 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 Electronically Signed   By: Franchot Gallo M.D.   On: 05/09/2020 12:27   DG Chest Portable 1 View  Result Date: 05/09/2020 CLINICAL DATA:  Unresponsive. EXAM: PORTABLE CHEST 1 VIEW COMPARISON:  November 20, 2019. FINDINGS: The heart size and mediastinal contours are within normal limits. Emphysematous disease is noted in the upper lobes. No consolidative process is noted. The visualized skeletal structures are unremarkable. IMPRESSION: No active disease. Emphysema (ICD10-J43.9). Electronically Signed   By: Marijo Conception M.D.   On: 05/09/2020 12:45   CT HEAD CODE STROKE WO CONTRAST  Result Date: 05/09/2020 CLINICAL DATA:  Code stroke.  Ataxia.  Unable to speak. EXAM: CT HEAD WITHOUT CONTRAST TECHNIQUE: Contiguous axial images were obtained from the base of the skull through the vertex without intravenous contrast. COMPARISON:  CT head 11/20/2019 FINDINGS: Brain: Moderate atrophy with interval progression. Negative for hydrocephalus. Chronic microvascular ischemic changes in the white matter. Chronic lacunar infarctions in the internal capsule right greater than left unchanged. Chronic infarcts in the pons bilaterally. Negative for acute infarct, hemorrhage, mass. Vascular: Negative for hyperdense vessel Skull: Negative Sinuses/Orbits: Paranasal sinuses clear.  Negative  orbit. Other: None ASPECTS ([place] Stroke Program Early CT Score) - Ganglionic level infarction (caudate, lentiform nuclei, internal capsule, insula, M1-M3 cortex): 7 - Supraganglionic infarction (M4-M6 cortex): 3 Total score (0-10 with 10 being normal): 10 IMPRESSION: 1. No acute abnormality 2. ASPECTS is 10 3. Moderate atrophy and moderate to advanced chronic ischemic changes. Electronically Signed   By: Franchot Gallo M.D.   On: 05/09/2020 12:16    Procedures .Critical Care Performed by: Lucrezia Starch, MD Authorized by: Lucrezia Starch, MD   Critical care provider statement:    Critical care time (minutes):  40   Critical care was necessary to treat or prevent imminent or life-threatening deterioration of the following conditions:  CNS failure or compromise   Critical care was time spent personally by me on the following activities:  Discussions with consultants, evaluation of patient's response to treatment, examination of patient, ordering and performing treatments and interventions, ordering and review of laboratory studies, ordering and review of radiographic studies, pulse oximetry, re-evaluation of patient's condition, obtaining history from patient or surrogate and review of old charts   (including critical care time)   Medications Ordered in ED Medications  0.9 %  sodium chloride infusion (Manually program via Guardrails IV Fluids) (has no administration in time range)  iohexol (OMNIPAQUE) 350 MG/ML injection 100 mL (100 mLs Intravenous Contrast Given 05/09/20 1208)    ED Course  I have reviewed the triage vital signs and the nursing notes.  Pertinent labs & imaging results that were available during my care of the patient were reviewed by me and considered in my medical decision making (see chart for details).    MDM Rules/Calculators/A&P                          54 year old male who presented to ER with concern for altered mental status.  After receiving EMS report  and my initial assessment, reviewed case with neurology, given the acute onset of mental status change, possible aphasia, recommended calling stroke alert.  CTA head and neck, CT perfusion studies were negative.  Neurology recommending MRI but no other acute neurologic needs.  Patient's mental status improved somewhat while he was in ER but still remained fairly confused.  Vital signs were stable and he was otherwise well-appearing.  Lab work was concerning for significant drop in his hemoglobin down to 6.  No history of any recent GI complaints, stool was brown.  Will send for occult study.  Ordered transfusion for 1 unit of blood.  Will admit to hospitalist service for further management and observation.  Dr. Tamala Julian with [facility] hospitalist will admit.  Final Clinical Impression(s) / ED Diagnoses Final diagnoses:  Altered mental status, unspecified altered mental status type  Anemia, unspecified type    Rx / DC Orders ED Discharge Orders    None       Lucrezia Starch, MD 05/09/20 1459

## 2020-05-09 NOTE — ED Notes (Signed)
Date and time results received: 05/09/20 1322 (use smartphrase ".now" to insert current time)  Test: hgb Critical Value: 6.0  Name of Provider Notified: D[place]  Orders Received? Or Actions Taken?: Orders Received - See Orders for details

## 2020-05-09 NOTE — ED Notes (Signed)
Sister left to go home for the night. Paper consent obtained for blood transfusion by sister. Pt unable to consent due to mental status, confusion, and cva

## 2020-05-09 NOTE — Progress Notes (Signed)
Preliminary review of the DWI images reveals a bilateral hemispheric punctate strokes-could be watershed in etiology. Not a surprising finding given the severe anemia. Other stroke risk factors should also be worked up.  Updated recommendations: In addition to recommendations provided in the consultation note earlier in the day, the following are advised: -Telemetry monitoring -Allow for permissive hypertension for the first 24-48h - only treat PRN if SBP >220 mmHg. Blood pressures can be gradually normalized to SBP<140 upon discharge. -MRA head without contrast, carotid Dopplers-given renal dysfunction cannot do CTA head and neck or MRA neck with contrast. -Echocardiogram -HgbA1c, fasting lipid panel -Frequent neuro checks -Prophylactic therapy-Antiplatelet med: Aspirin - dose 325mg  PO -Atorvastatin 80 mg PO daily -Risk factor modification -PT consult, OT consult, Speech consult  Please page stroke NP/PA/MD (listed on AMION)  from 8am-4 pm as this patient will be followed by the stroke team at this point.  -- Amie Portland, MD [facility] Pager: [phone] If 7pm to 7am, please call on call as listed on AMION.

## 2020-05-09 NOTE — Consult Note (Signed)
Referring Provider: Dr. Fuller Plan Primary Care Physician:  [name], NP Primary Gastroenterologist:  Althia Forts  Reason for Consultation: Symptomatic anemia  HPI: Darren Pearson is a 54 y.o. male with history of Hepatitis C who presented to the ED with altered mental status being seen for consultation of symptomatic anemia.  Patient is oriented only to self and is unable to provide history.  When asked if he has recently had any rectal bleeding, he first responded "OK."  When asked again later if he had red or black stools, he responded "no."  He also responded "no" to chest pain, abdominal pain, and dysphagia.   Per chart review, it appears that patient has been feeling unwell for the last few weeks and has not been taking his regularly prescribed medications.  His sister found him unresponsive today.  He was normal as of yesterday.  Per chart review, no prior colonoscopy or endoscopy.  Per chart review, patient takes aspirin 81 mg daily but no blood thinners are on file.  Past Medical History:  Diagnosis Date  . Allergy   . Anxiety and depression   . Asthma   . Hepatitis C   . History of chicken pox   . Hypertension     Past Surgical History:  Procedure Laterality Date  . BUBBLE STUDY  11/22/2019   Procedure: BUBBLE STUDY;  Surgeon: Dorothy Spark, MD;  Location: [facility];  Service: Cardiovascular;;  . LOOP RECORDER INSERTION N/A 11/22/2019   Procedure: LOOP RECORDER INSERTION;  Surgeon: Thompson Grayer, MD;  Location: [facility];  Service: Cardiovascular;  Laterality: N/A;  . TEE WITHOUT CARDIOVERSION N/A 11/22/2019   Procedure: TRANSESOPHAGEAL ECHOCARDIOGRAM (TEE);  Surgeon: Dorothy Spark, MD;  Location: [facility] ENDOSCOPY;  Service: Cardiovascular;  Laterality: N/A;    Prior to Admission medications   Medication Sig Start Date End Date Taking? Authorizing Provider  acetaminophen (TYLENOL) 325 MG tablet Take 650 mg by mouth every 6 (six) hours as  needed for mild pain or headache.    [provider]  amLODipine (NORVASC) 10 MG tablet Take 1 tablet (10 mg total) by mouth daily. 04/09/20   [name], NP  aspirin 81 MG EC tablet Take 1 tablet (81 mg total) by mouth daily. 11/24/19   Kathie Dike, MD  atorvastatin (LIPITOR) 40 MG tablet Take 1 tablet (40 mg total) by mouth daily at 6 PM. 11/23/19   Kathie Dike, MD  cyanocobalamin 1000 MCG tablet Take 1 tablet (1,000 mcg total) by mouth daily. 11/24/19   Kathie Dike, MD  lisinopril-hydrochlorothiazide (ZESTORETIC) 20-25 MG tablet Take 1 tablet by mouth daily. 04/09/20   [name], NP    Scheduled Meds: . sodium chloride   Intravenous Once  . sodium chloride flush  3 mL Intravenous Q12H   Continuous Infusions: . sodium chloride    . potassium chloride     PRN Meds:.acetaminophen **OR** acetaminophen, albuterol, hydrocortisone cream  Allergies as of 05/09/2020 - Review Complete 05/09/2020  Allergen Reaction Noted  . Sulfa antibiotics Hives 12/16/2012    Family History  Problem Relation Age of Onset  . Stomach cancer Mother   . Diabetes Father   . Paget's disease of bone Father     Social History   Socioeconomic History  . Marital status: Single    Spouse name: Not on file  . Number of children: Not on file  . Years of education: 45  . Highest education level: Not on file  Occupational  History  . Occupation: Programmer, applications: [employer]  Tobacco Use  . Smoking status: Former Smoker    Packs/day: 0.25    Types: Cigarettes    Start date: 11/24/2012  . Smokeless tobacco: Never Used  . Tobacco comment: has not smoked since 11/20/2019  Vaping Use  . Vaping Use: Former  Substance and Sexual Activity  . Alcohol use: Not Currently    Alcohol/week: 2.0 standard drinks    Types: 2 Cans of beer per week    Comment: social, states stopped on 11/20/2019.  Marland Kitchen Drug use: Not Currently    Frequency: 5.0 times per  week    Types: Marijuana    Comment: Stop 11/20/2019  . Sexual activity: Yes    Birth control/protection: Condom  Other Topics Concern  . Not on file  Social History Narrative   Regular exercise-yes   Social Determinants of Health   Financial Resource Strain:   . Difficulty of Paying Living Expenses:   Food Insecurity:   . Worried About Charity fundraiser in the Last Year:   . Arboriculturist in the Last Year:   Transportation Needs: No Transportation Needs  . Lack of Transportation (Medical): No  . Lack of Transportation (Non-Medical): No  Physical Activity:   . Days of Exercise per Week:   . Minutes of Exercise per Session:   Stress:   . Feeling of Stress :   Social Connections:   . Frequency of Communication with Friends and Family:   . Frequency of Social Gatherings with Friends and Family:   . Attends Religious Services:   . Active Member of Clubs or Organizations:   . Attends Archivist Meetings:   Marland Kitchen Marital Status:   Intimate Partner Violence:   . Fear of Current or Ex-Partner:   . Emotionally Abused:   Marland Kitchen Physically Abused:   . Sexually Abused:     Review of Systems: Limited by patient status (disoriented).  Physical Exam: Vital signs: Vitals:   05/09/20 1417 05/09/20 1500  BP: (!) 146/79 140/79  Pulse: (!) 110 85  Resp: 20 13  Temp: 98.3 F (36.8 C)   SpO2: 100% 100%     Physical Exam Constitutional:      General: He is not in acute distress. HENT:     Head: Normocephalic and atraumatic.     Mouth/Throat:     Mouth: Mucous membranes are moist.     Pharynx: Oropharynx is clear.  Eyes:     General: No scleral icterus.    Extraocular Movements: Extraocular movements intact.     Comments: Mild conjunctival pallor  Cardiovascular:     Rate and Rhythm: Regular rhythm. Tachycardia present.     Pulses: Normal pulses.  Pulmonary:     Effort: Pulmonary effort is normal. No respiratory distress.  Abdominal:     General: Bowel sounds are  normal. There is no distension.     Palpations: Abdomen is soft. There is no mass.     Tenderness: There is no abdominal tenderness. There is no guarding or rebound.  Musculoskeletal:     Cervical back: Normal range of motion and neck supple.  Skin:    General: Skin is warm and dry.  Neurological:     Mental Status: He is alert. He is disoriented.  Psychiatric:        Mood and Affect: Affect is blunt.        Speech: Speech is delayed.  Behavior: Behavior is slowed.     Comments: Patient is oriented only to self. Unable to report location (knows he is in a hospital but unaware of which hospital or the city).  Unable to report date.  Unable to report president.    GI:  Lab Results: Recent Labs    05/09/20 1222 05/09/20 1225  WBC  --  8.4  HGB 7.1* 6.0*  HCT 21.0* 21.6*  PLT  --  491*   BMET Recent Labs    05/09/20 1222 05/09/20 1225  NA 144 141  K 3.3* 3.3*  CL 109 108  CO2  --  20*  GLUCOSE 84 88  BUN 26* 23*  CREATININE 2.60* 2.47*  CALCIUM  --  9.0   LFT Recent Labs    05/09/20 1225  PROT 8.5*  ALBUMIN 3.5  AST 23  ALT 14  ALKPHOS 67  BILITOT 0.5   PT/INR Recent Labs    05/09/20 1225  LABPROT 14.2  INR 1.1     Studies/Results: CT Code Stroke CTA Head W/WO contrast  Result Date: 05/09/2020 CLINICAL DATA:  Unable to speak.  Stroke. EXAM: CT ANGIOGRAPHY HEAD AND NECK CT PERFUSION BRAIN TECHNIQUE: Multidetector CT imaging of the head and neck was performed using the standard protocol during bolus administration of intravenous contrast. Multiplanar CT image reconstructions and MIPs were obtained to evaluate the vascular anatomy. Carotid stenosis measurements (when applicable) are obtained utilizing NASCET criteria, using the distal internal carotid diameter as the denominator. Multiphase CT imaging of the brain was performed following IV bolus contrast injection. Subsequent parametric perfusion maps were calculated using RAPID software. CONTRAST:   157mL OMNIPAQUE IOHEXOL 350 MG/ML SOLN COMPARISON:  CT head 05/09/2020.  CTA head 11/21/2019 FINDINGS: CTA NECK FINDINGS Aortic arch: Standard branching. Imaged portion shows no evidence of aneurysm or dissection. No significant stenosis of the major arch vessel origins. Right carotid system: Right carotid widely patent. Minimal atherosclerotic disease right carotid bulb. Left carotid system: Left carotid widely patent. Minimal atherosclerotic disease left carotid bulb. Vertebral arteries: Right vertebral artery dominant. Moderate stenosis proximal right vertebral artery unchanged. Remainder of the right vertebral artery is patent. Small left vertebral artery ends in PICA without focal stenosis. Skeleton: Cervical spondylosis.  No acute skeletal abnormality. Other neck: Negative for mass or adenopathy in the neck. Upper chest: Prominent apical blebs and emphysema. No acute abnormality. Review of the MIP images confirms the above findings CTA HEAD FINDINGS Anterior circulation: Mild atherosclerotic calcification in the cavernous carotid bilaterally. 2 mm left paraclinoid aneurysm projecting laterally is unchanged. Anterior and middle cerebral arteries widely patent without significant stenosis or occlusion. Posterior circulation: Right vertebral artery supplies the basilar which is patent. Right PICA patent. Left vertebral artery is small and ends in PICA. Superior cerebellar and posterior cerebral arteries patent bilaterally without stenosis. Fetal origin right posterior cerebral artery. No aneurysm in the posterior circulation. Venous sinuses: Normal venous enhancement Anatomic variants: None Review of the MIP images confirms the above findings CT Brain Perfusion Findings: ASPECTS: 10 CBF (<30%) Volume: 40mL Perfusion (Tmax>6.0s) volume: 43mL Mismatch Volume: 58mL Infarction Location:None IMPRESSION: 1. CT perfusion negative for acute ischemia or infarction 2. Negative for intracranial large vessel occlusion or flow  limiting stenosis 3. Both carotid arteries are widely patent in the neck with minimal atherosclerotic disease. 4. Moderate stenosis proximal right vertebral artery which is dominant. Small left vertebral artery ends in PICA 5. 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  Electronically Signed   By: Franchot Gallo M.D.   On: 05/09/2020 12:27   CT Code Stroke CTA Neck W/WO contrast  Result Date: 05/09/2020 CLINICAL DATA:  Unable to speak.  Stroke. EXAM: CT ANGIOGRAPHY HEAD AND NECK CT PERFUSION BRAIN TECHNIQUE: Multidetector CT imaging of the head and neck was performed using the standard protocol during bolus administration of intravenous contrast. Multiplanar CT image reconstructions and MIPs were obtained to evaluate the vascular anatomy. Carotid stenosis measurements (when applicable) are obtained utilizing NASCET criteria, using the distal internal carotid diameter as the denominator. Multiphase CT imaging of the brain was performed following IV bolus contrast injection. Subsequent parametric perfusion maps were calculated using RAPID software. CONTRAST:  126mL OMNIPAQUE IOHEXOL 350 MG/ML SOLN COMPARISON:  CT head 05/09/2020.  CTA head 11/21/2019 FINDINGS: CTA NECK FINDINGS Aortic arch: Standard branching. Imaged portion shows no evidence of aneurysm or dissection. No significant stenosis of the major arch vessel origins. Right carotid system: Right carotid widely patent. Minimal atherosclerotic disease right carotid bulb. Left carotid system: Left carotid widely patent. Minimal atherosclerotic disease left carotid bulb. Vertebral arteries: Right vertebral artery dominant. Moderate stenosis proximal right vertebral artery unchanged. Remainder of the right vertebral artery is patent. Small left vertebral artery ends in PICA without focal stenosis. Skeleton: Cervical spondylosis.  No acute skeletal abnormality. Other neck: Negative for mass or adenopathy in the neck.  Upper chest: Prominent apical blebs and emphysema. No acute abnormality. Review of the MIP images confirms the above findings CTA HEAD FINDINGS Anterior circulation: Mild atherosclerotic calcification in the cavernous carotid bilaterally. 2 mm left paraclinoid aneurysm projecting laterally is unchanged. Anterior and middle cerebral arteries widely patent without significant stenosis or occlusion. Posterior circulation: Right vertebral artery supplies the basilar which is patent. Right PICA patent. Left vertebral artery is small and ends in PICA. Superior cerebellar and posterior cerebral arteries patent bilaterally without stenosis. Fetal origin right posterior cerebral artery. No aneurysm in the posterior circulation. Venous sinuses: Normal venous enhancement Anatomic variants: None Review of the MIP images confirms the above findings CT Brain Perfusion Findings: ASPECTS: 10 CBF (<30%) Volume: 7mL Perfusion (Tmax>6.0s) volume: 48mL Mismatch Volume: 38mL Infarction Location:None IMPRESSION: 1. CT perfusion negative for acute ischemia or infarction 2. Negative for intracranial large vessel occlusion or flow limiting stenosis 3. Both carotid arteries are widely patent in the neck with minimal atherosclerotic disease. 4. Moderate stenosis proximal right vertebral artery which is dominant. Small left vertebral artery ends in PICA 5. 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 Electronically Signed   By: Franchot Gallo M.D.   On: 05/09/2020 12:27   CT Code Stroke Cerebral Perfusion with contrast  Result Date: 05/09/2020 CLINICAL DATA:  Unable to speak.  Stroke. EXAM: CT ANGIOGRAPHY HEAD AND NECK CT PERFUSION BRAIN TECHNIQUE: Multidetector CT imaging of the head and neck was performed using the standard protocol during bolus administration of intravenous contrast. Multiplanar CT image reconstructions and MIPs were obtained to evaluate the vascular anatomy. Carotid stenosis  measurements (when applicable) are obtained utilizing NASCET criteria, using the distal internal carotid diameter as the denominator. Multiphase CT imaging of the brain was performed following IV bolus contrast injection. Subsequent parametric perfusion maps were calculated using RAPID software. CONTRAST:  154mL OMNIPAQUE IOHEXOL 350 MG/ML SOLN COMPARISON:  CT head 05/09/2020.  CTA head 11/21/2019 FINDINGS: CTA NECK FINDINGS Aortic arch: Standard branching. Imaged portion shows no evidence of aneurysm or dissection. No significant stenosis of the major arch  vessel origins. Right carotid system: Right carotid widely patent. Minimal atherosclerotic disease right carotid bulb. Left carotid system: Left carotid widely patent. Minimal atherosclerotic disease left carotid bulb. Vertebral arteries: Right vertebral artery dominant. Moderate stenosis proximal right vertebral artery unchanged. Remainder of the right vertebral artery is patent. Small left vertebral artery ends in PICA without focal stenosis. Skeleton: Cervical spondylosis.  No acute skeletal abnormality. Other neck: Negative for mass or adenopathy in the neck. Upper chest: Prominent apical blebs and emphysema. No acute abnormality. Review of the MIP images confirms the above findings CTA HEAD FINDINGS Anterior circulation: Mild atherosclerotic calcification in the cavernous carotid bilaterally. 2 mm left paraclinoid aneurysm projecting laterally is unchanged. Anterior and middle cerebral arteries widely patent without significant stenosis or occlusion. Posterior circulation: Right vertebral artery supplies the basilar which is patent. Right PICA patent. Left vertebral artery is small and ends in PICA. Superior cerebellar and posterior cerebral arteries patent bilaterally without stenosis. Fetal origin right posterior cerebral artery. No aneurysm in the posterior circulation. Venous sinuses: Normal venous enhancement Anatomic variants: None Review of the MIP  images confirms the above findings CT Brain Perfusion Findings: ASPECTS: 10 CBF (<30%) Volume: 57mL Perfusion (Tmax>6.0s) volume: 101mL Mismatch Volume: 30mL Infarction Location:None IMPRESSION: 1. CT perfusion negative for acute ischemia or infarction 2. Negative for intracranial large vessel occlusion or flow limiting stenosis 3. Both carotid arteries are widely patent in the neck with minimal atherosclerotic disease. 4. Moderate stenosis proximal right vertebral artery which is dominant. Small left vertebral artery ends in PICA 5. 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 Electronically Signed   By: Franchot Gallo M.D.   On: 05/09/2020 12:27   DG Chest Portable 1 View  Result Date: 05/09/2020 CLINICAL DATA:  Unresponsive. EXAM: PORTABLE CHEST 1 VIEW COMPARISON:  November 20, 2019. FINDINGS: The heart size and mediastinal contours are within normal limits. Emphysematous disease is noted in the upper lobes. No consolidative process is noted. The visualized skeletal structures are unremarkable. IMPRESSION: No active disease. Emphysema (ICD10-J43.9). Electronically Signed   By: Marijo Conception M.D.   On: 05/09/2020 12:45   CT HEAD CODE STROKE WO CONTRAST  Result Date: 05/09/2020 CLINICAL DATA:  Code stroke.  Ataxia.  Unable to speak. EXAM: CT HEAD WITHOUT CONTRAST TECHNIQUE: Contiguous axial images were obtained from the base of the skull through the vertex without intravenous contrast. COMPARISON:  CT head 11/20/2019 FINDINGS: Brain: Moderate atrophy with interval progression. Negative for hydrocephalus. Chronic microvascular ischemic changes in the white matter. Chronic lacunar infarctions in the internal capsule right greater than left unchanged. Chronic infarcts in the pons bilaterally. Negative for acute infarct, hemorrhage, mass. Vascular: Negative for hyperdense vessel Skull: Negative Sinuses/Orbits: Paranasal sinuses clear.  Negative orbit. Other: None ASPECTS  ([place] Stroke Program Early CT Score) - Ganglionic level infarction (caudate, lentiform nuclei, internal capsule, insula, M1-M3 cortex): 7 - Supraganglionic infarction (M4-M6 cortex): 3 Total score (0-10 with 10 being normal): 10 IMPRESSION: 1. No acute abnormality 2. ASPECTS is 10 3. Moderate atrophy and moderate to advanced chronic ischemic changes. Electronically Signed   By: Franchot Gallo M.D.   On: 05/09/2020 12:16    Impression: Symptomatic anemia: FOBT negative, brown stool noted per ED provider's rectal exam -Patient does not report any recent rectal bleeding, though he is disoriented -Hgb 6.0 as compared to baseline of 12.2 in 10/2019 -BUN 23/ Cr 2.60 -Normotensive to hypertensive, mildly tachycardic, and appears stable  History of Hepatitis  C -LFTs normal as of today  Acute altered mental status -Head CT without acute changes  Plan: Symptomatic anemia with hemoglobin of 6.0 but FOBT negative with no signs of rectal bleeding or melena.  Continue monitor H&H with transfusion as needed to maintain hemoglobin >7.  Continue to monitor for signs of GI bleeding.  We will defer EGD/colonoscopy at this time due to altered mental status of unclear etiology, as well as negative FOBT, brown stools, and no current signs of GI bleeding.    [facility] will follow.   LOS: 0 days   Salley Slaughter  PA-C 05/09/2020, 4:30 PM  Contact #  [phone]

## 2020-05-09 NOTE — ED Notes (Signed)
ACTIVATED CODE STROKE @ 11:49

## 2020-05-09 NOTE — Code Documentation (Signed)
54 yo male coming from home where he lives with his sister. EMS was called after patient was found down in the front yard. Brought in to [facility] and seen by EDP. Patient had been seen last night when he went to bed by sister.   EDP activated Code Stroke due to the patient noting to have some right sided weakness and being mute. Stroke Team met patient in CT. Patient noted to have severe bed bugs known by family. CT Head completed and showed "Chronic lacunar infarctions in the internal capsule right greater than left unchanged. Chronic infarcts in the pons bilaterally. No acute abnormality." Initial NIHSS 20 due to right sided arm weakness, pt not following commands or answering questions, bilateral leg weakness (pt is not cooperative with exam), and no response to painful stimuli. CTA/CTP completed.   MD called sister and clarified that patient has been not bathing or speaking since his stroke three weeks ago. His presentation is the same as he has been besides being found in the front yard this morning. Pt has not been letting her clean the bed bugs or help clean him since he has been home.   Pt to have medical work-up. Waiting for MRI and labs for admit.  Handoff given to [employer], Therapist, sports.

## 2020-05-09 NOTE — ED Notes (Signed)
Pt transported to MRI 

## 2020-05-09 NOTE — ED Notes (Signed)
Attempted to interrogate medtronic loop recorder, unsuccessful

## 2020-05-09 NOTE — ED Notes (Signed)
Pt sister stepped out and will return. Requesting a phone call if she is needed.

## 2020-05-09 NOTE — ED Notes (Signed)
Blood bank called to report issues with obtaining antibody testing for RBC preparation, asked to redraw another pink top. Phlebotomy made aware

## 2020-05-09 NOTE — Consult Note (Addendum)
Neurology Consultation  Reason for Consult: Code stroke for unresponsiveness and aphasia Referring Physician: Dr. Madalyn Rob, EDP  CC: Unresponsiveness, aphasia  History is obtained from: Chart review, patient's sister  HPI: Darren Pearson is a 54 y.o. male past medical history of anxiety and depression, hepatitis C, hypertension, stroke in 2020 with residual left-sided hemiparesis and requiring a cane to walk. Brought in today because the sister was concerned that he was not acting like himself.  Code stroke was activated because he was not responding to questions. In December 2020, he was admitted with scattered embolic-looking strokes without a clear source.  Was discharged home with a loop recorder-hypercoagulable panel negative, ANA negative, HIV negative, B12 low at 170 and TSH normal. According to sister for the past few weeks he has been having trouble getting out of bed, he remains in bed, does not shower and has had bedbug infestation which the family has not been able to treat because he refuses to get out of the room.  She got concerned today because he was outside the house slouching on a wall, and that is why he called EMS.  They brought him to the ER for evaluation of altered mental status.  On evaluation by the ED provider, he was not responding to his questions by verbalizing answers-concern for aphasia and code stroke activated because initially the last known normal was given less than 24 hours. The sister confirms that he has not been normal has been behaving like this now at least 4 weeks. He was discharged in December with dual antiplatelets for 3 months followed by aspirin only.  I am not sure if he is compliant to medications. Follow-up visit with outpatient neurology in March 2021-reported awake, alert, oriented to time place and person patient with mild spastic left hemiparesis, and slight left-sided dysmetria with left leg circumduction and inability to perform tandem  walk.  Modified Rankin at baseline-2.  NIH stroke scale on the last clinic visit was 3. No seizures reported.  LKW: Few weeks ago tpa given?: no, outside the window Premorbid modified Rankin scale (mRS):2 -documented in the last outpatient neurology visit from March 2021, but according to the sister for the past few weeks is worse-likely 3-4. ROS: Unable to perform due to patient not talking.  Past Medical History:  Diagnosis Date  . Allergy   . Anxiety and depression   . Asthma   . Hepatitis C   . History of chicken pox   . Hypertension      Family History  Problem Relation Age of Onset  . Stomach cancer Mother   . Diabetes Father   . Paget's disease of bone Father    Social History:   reports that he has quit smoking. His smoking use included cigarettes. He started smoking about 7 years ago. He smoked 0.25 packs per day. He has never used smokeless tobacco. He reports previous alcohol use of about 2.0 standard drinks of alcohol per week. He reports previous drug use. Frequency: 5.00 times per week. Drug: Marijuana.  Medications No current facility-administered medications for this encounter.  Current Outpatient Medications:  .  acetaminophen (TYLENOL) 325 MG tablet, Take 650 mg by mouth every 6 (six) hours as needed for mild pain or headache., Disp: , Rfl:  .  amLODipine (NORVASC) 10 MG tablet, Take 1 tablet (10 mg total) by mouth daily., Disp: 90 tablet, Rfl: 3 .  aspirin 81 MG EC tablet, Take 1 tablet (81 mg total) by mouth daily.,  Disp: 90 tablet, Rfl: 0 .  atorvastatin (LIPITOR) 40 MG tablet, Take 1 tablet (40 mg total) by mouth daily at 6 PM., Disp: 90 tablet, Rfl: 0 .  cyanocobalamin 1000 MCG tablet, Take 1 tablet (1,000 mcg total) by mouth daily., Disp: 90 tablet, Rfl: 0 .  lisinopril-hydrochlorothiazide (ZESTORETIC) 20-25 MG tablet, Take 1 tablet by mouth daily., Disp: 90 tablet, Rfl: 3   Exam: Current vital signs: BP 116/63   Pulse (!) 104   Resp 17   SpO2 100%   Vital signs in last 24 hours: Pulse Rate:  [99-116] 104 (06/16 1217) Resp:  [14-29] 17 (06/16 1219) BP: (96-123)/(61-63) 116/63 (06/16 1215) SpO2:  [97 %-100 %] 100 % (06/16 1217) General: He is awake, alert, does not respond to questions. HEENT: Normocephalic, atraumatic, dry oral mucous membranes. Lungs: Breathing well and saturating normally on room air Cardiovascular: Regular rhythm Abdomen: Nondistended nontender Integumentary/extremities-no edema, possible bedbugs Neurological exam Is awake, alert. He continues to stare straight ahead and does not respond to questions and words but nods yes and no more or less appropriately. Does not follow commands-unable to raise arms or legs to command or make a fist or show 2 fingers. Completely nonverbal Cranial nerves: Pupils are equal round reactive to light, gaze is midline without any forced deviation, blinks to threat from both sides, face appears symmetric. Motor exam: Not participating in the exam but upon passively raising his left arm up, he is able to keep it in the ER with some drift for at least 4 to 5 seconds and then drifts down to the bed.  Right upper extremity-does not hold antigravity and does not move it spontaneously or withdraw to noxious stimulation.  Both lower extremities-no spontaneous movement, does not follow commands to lift them up in the ER and mild withdrawal noted to very strong noxious stimulation. Sensory exam: As above Coordination: Difficult to assess due to his mentation. NIHSS 1a Level of Conscious.: 0 1b LOC Questions: 2 1c LOC Commands:2  2 Best Gaze: 0 3 Visual: 0 4 Facial Palsy:0  5a Motor Arm - left:2 5b Motor Arm - Right: 3 6a Motor Leg - Left: 3 6b Motor Leg - Right: 3 7 Limb Ataxia: 0 8 Sensory: 0 9 Best Language: 3 10 Dysarthria: 2 11 Extinct. and Inatten.:0  TOTAL: 20  Labs I have reviewed labs in epic and the results pertinent to this consultation are: CBC-hemoglobin 7.1     Component Value Date/Time   WBC 6.4 11/20/2019 1006   RBC 3.98 (L) 11/20/2019 1006   HGB 7.1 (L) 05/09/2020 1222   HCT 21.0 (L) 05/09/2020 1222   PLT 232 11/20/2019 1006   MCV 95.5 11/20/2019 1006   MCH 30.7 11/20/2019 1006   MCHC 32.1 11/20/2019 1006   RDW 12.7 11/20/2019 1006   LYMPHSABS 1.3 11/20/2019 1006   MONOABS 0.4 11/20/2019 1006   EOSABS 0.3 11/20/2019 1006   BASOSABS 0.0 11/20/2019 1006    CMP -creatinine 2.6 (baseline creatinine 1.19.)    Component Value Date/Time   NA 144 05/09/2020 1222   K 3.3 (L) 05/09/2020 1222   CL 109 05/09/2020 1222   CO2 27 11/21/2019 1317   GLUCOSE 84 05/09/2020 1222   BUN 26 (H) 05/09/2020 1222   CREATININE 2.60 (H) 05/09/2020 1222   CREATININE 0.91 05/30/2014 1529   CALCIUM 8.7 (L) 11/21/2019 1317   PROT 8.0 11/20/2019 1006   ALBUMIN 3.7 11/20/2019 1006   AST 24 11/20/2019 1006   ALT 13  11/20/2019 1006   ALKPHOS 89 11/20/2019 1006   BILITOT 1.0 11/20/2019 1006   GFRNONAA >60 11/21/2019 1317   GFRAA >60 11/21/2019 1317    Imaging I have reviewed the images obtained: CT-scan of the brain-no acute changes, chronic white matter disease and chronic lacunar infarction seen.  No bleed. CTA head and neck-preliminary review shows no emergent LVO.  CT perfusion with no perfusion deficits.  Assessment: 54 year old man with above past medical history, presenting for evaluation of altered mental status and concern for aphasia and large vessel occlusion stroke which was actually determined to be inaccurate-last known normal has been few weeks now. Family reports some behaving abnormally, not take being able to take care of himself, not bathing/grooming as well as infested with bedbugs Exam reveals more of encephalopathy and abulia-concern for frontal strokes versus an underlying metabolic process. Lab review also concerning for acute metabolic derangement in the form of renal dysfunction as well as severe anemia with a hemoglobin in the  sevens when his baseline hemoglobin is high 12's.  Could raise suspicion for watershed strokes as well. Given history of strokes at young age and no clear source, an MRI definitely is warranted  Impression: -Toxic metabolic encephalopathy due to deranged renal function and anemia -evaluate for source of bleeding -Evaluate for new strokes  Recommendations: -MRI brain when able to -Work-up for the acute anemia per primary team/ER -Correct metabolic derangements including renal derangeemnt -UA. CXR -B12, TSH, Ammonia -If he continues to be encephalopathic even after correction of metabolic derangements and no clear explanation is found for his current presentation, can consider doing an EEG at that point as well -Need social work consult-for likely placement.  Plan discussed with Dr. Roslynn Amble in the emergency room. We will follow imaging and test results.  -- Amie Portland, MD [facility] Pager: [phone] If 7pm to 7am, please call on call as listed on AMION.  CRITICAL CARE ATTESTATION Performed by: Amie Portland, MD Total critical care time: 40 minutes Critical care time was exclusive of separately billable procedures and treating other patients and/or supervising APPs/Residents/Students Critical care was necessary to treat or prevent imminent or life-threatening deterioration due to acute stroke evaluation, toxic metabolic encephalopathy. This patient is critically ill and at significant risk for neurological worsening and/or death and care requires constant monitoring. Critical care was time spent personally by me on the following activities: development of treatment plan with patient and/or surrogate as well as nursing, discussions with consultants, evaluation of patient's response to treatment, examination of patient, obtaining history from patient or surrogate, ordering and performing treatments and interventions, ordering and review of laboratory studies, ordering and  review of radiographic studies, pulse oximetry, re-evaluation of patient's condition, participation in multidisciplinary rounds and medical decision making of high complexity in the care of this patient.

## 2020-05-09 NOTE — H&P (Addendum)
History and Physical    Darren Pearson Darren Pearson:[id number] DOB: 1966-05-28 DOA: 05/09/2020  Referring MD/NP/PA: Audrie Lia, MD  PCP: [name], NP  Patient coming from: Home (lives with sister) via EMS  Chief Complaint: Unresponsive  I have personally briefly reviewed patient's old medical records in [place]   HPI: Darren Pearson is a 54 y.o. male with medical history significant of hypertension, hyperlipidemia, CVA with residual left-sided hemiparesis, hepatitis C, anxiety, and depression who presents after his sister found him slouch against the house outside.  She was unaware of how he got outside and noted that he was drooling and not responding. Denies seeing any seizure-like activity. History is obtained from the patient and in talks with his sister over the phone.  Over the last couple of days patient had been refusing to eat certain meals throughout the day despite his sister fixing things that he usually likes.  Over the last 3 to 4 weeks he had not been his normal self, was not taking his medications regularly, was not getting out of bed, or taking showers.  His sister asked him on several occasions if everything was okay or if anything was hurting him, but he would never say.  Patient did however state that he has seen blood in his stools, but cannot tell me when.  He also admits to using ibuprofen.  The sister is unaware of patient having any bleeding and is not sure if he has ever had a colonoscopy before.  He denies having any other complaints at this time.  ED Course: Brought in as a code stroke and seen by neurology.  Initial CT scan of the brain did not note any acute abnormalities.  It was noted afebrile, pulse 88-116, respiration 14-29, and all other vitals maintained.  Labs significant for hemoglobin 6 with low MCV and MCH, platelets 491, potassium 3.3, BUN 23, and creatinine 2.47.  Urinalysis was positive for moderate blood, rare bacteria, specific gravity  1.033, 0-5 RBCs, 0-5 WBCs.  CTA of the head and neck negative for any acute abnormalities.  UDS was negative.  Stool guaiacs were negative.  Patient was typed and screen ordered to be given 1 unit of packed red blood cells.  Neurology recommended MRI of the brain.  TRH called to admit.  Review of Systems  Unable to perform ROS: Mental status change  Gastrointestinal: Positive for blood in stool.    Past Medical History:  Diagnosis Date  . Allergy   . Anxiety and depression   . Asthma   . Hepatitis C   . History of chicken pox   . Hypertension     Past Surgical History:  Procedure Laterality Date  . BUBBLE STUDY  11/22/2019   Procedure: BUBBLE STUDY;  Surgeon: Dorothy Spark, MD;  Location: [facility];  Service: Cardiovascular;;  . LOOP RECORDER INSERTION N/A 11/22/2019   Procedure: LOOP RECORDER INSERTION;  Surgeon: Thompson Grayer, MD;  Location: [facility];  Service: Cardiovascular;  Laterality: N/A;  . TEE WITHOUT CARDIOVERSION N/A 11/22/2019   Procedure: TRANSESOPHAGEAL ECHOCARDIOGRAM (TEE);  Surgeon: Dorothy Spark, MD;  Location: [facility] ENDOSCOPY;  Service: Cardiovascular;  Laterality: N/A;     reports that he has quit smoking. His smoking use included cigarettes. He started smoking about 7 years ago. He smoked 0.25 packs per day. He has never used smokeless tobacco. He reports previous alcohol use of about 2.0 standard drinks of alcohol per week. He reports previous drug use.  Frequency: 5.00 times per week. Drug: Marijuana.  Allergies  Allergen Reactions  . Sulfa Antibiotics Hives    Family History  Problem Relation Age of Onset  . Stomach cancer Mother   . Diabetes Father   . Paget's disease of bone Father     Prior to Admission medications   Medication Sig Start Date End Date Taking? Authorizing Provider  acetaminophen (TYLENOL) 325 MG tablet Take 650 mg by mouth every 6 (six) hours as needed for mild pain or headache.    [provider]    amLODipine (NORVASC) 10 MG tablet Take 1 tablet (10 mg total) by mouth daily. 04/09/20   [name], NP  aspirin 81 MG EC tablet Take 1 tablet (81 mg total) by mouth daily. 11/24/19   Kathie Dike, MD  atorvastatin (LIPITOR) 40 MG tablet Take 1 tablet (40 mg total) by mouth daily at 6 PM. 11/23/19   Kathie Dike, MD  cyanocobalamin 1000 MCG tablet Take 1 tablet (1,000 mcg total) by mouth daily. 11/24/19   Kathie Dike, MD  lisinopril-hydrochlorothiazide (ZESTORETIC) 20-25 MG tablet Take 1 tablet by mouth daily. 04/09/20   [name], NP    Physical Exam:  Constitutional: Elderly male who appears to be in no acute distress. Vitals:   05/09/20 1217 05/09/20 1219 05/09/20 1330 05/09/20 1417  BP:    (!) 146/79  Pulse: (!) 104  88 (!) 110  Resp: 18 17 18 20   Temp:    98.3 F (36.8 C)  TempSrc:    Oral  SpO2: 100%  100% 100%   Eyes: PERRL, lids and conjunctivae normal ENMT: Mucous membranes are dry. Posterior pharynx clear of any exudate or lesions. Neck: normal, supple, no masses, no thyromegaly Respiratory: clear to auscultation bilaterally, no wheezing, no crackles. Normal respiratory effort. No accessory muscle use.  Cardiovascular: Regular rate and rhythm, no murmurs / rubs / gallops. No extremity edema. 2+ pedal pulses. No carotid bruits.  Abdomen: no tenderness, no masses palpated. No hepatosplenomegaly. Bowel sounds positive.  Musculoskeletal: no clubbing / cyanosis.  Amputations of the third fourth and fifth digit of the left hand.    Skin: Multiple bug bites noted in the webbing between patient's toes. Neurologic: Patient appears able to move all extremities Psychiatric: Alert and oriented to self.  Poor memory.   Labs on Admission: I have personally reviewed following labs and imaging studies  CBC: Recent Labs  Lab 05/09/20 1222 05/09/20 1225  WBC  --  8.4  NEUTROABS  --  6.9  HGB 7.1* 6.0*  HCT 21.0* 21.6*  MCV  --  77.1*  PLT  --  491*    Basic Metabolic Panel: Recent Labs  Lab 05/09/20 1222 05/09/20 1225  NA 144 141  K 3.3* 3.3*  CL 109 108  CO2  --  20*  GLUCOSE 84 88  BUN 26* 23*  CREATININE 2.60* 2.47*  CALCIUM  --  9.0   GFR: CrCl cannot be calculated (Unknown ideal weight.). Liver Function Tests: Recent Labs  Lab 05/09/20 1225  AST 23  ALT 14  ALKPHOS 67  BILITOT 0.5  PROT 8.5*  ALBUMIN 3.5   No results for input(s): LIPASE, AMYLASE in the last 168 hours. Recent Labs  Lab 05/09/20 1225  AMMONIA 39*   Coagulation Profile: Recent Labs  Lab 05/09/20 1225  INR 1.1   Cardiac Enzymes: No results for input(s): CKTOTAL, CKMB, CKMBINDEX, TROPONINI in the last 168 hours. BNP (last 3 results) No results for  input(s): PROBNP in the last 8760 hours. HbA1C: No results for input(s): HGBA1C in the last 72 hours. CBG: Recent Labs  Lab 05/09/20 1141  GLUCAP 96   Lipid Profile: No results for input(s): CHOL, HDL, LDLCALC, TRIG, CHOLHDL, LDLDIRECT in the last 72 hours. Thyroid Function Tests: Recent Labs    05/09/20 1227  TSH 1.967   Anemia Panel: Recent Labs    05/09/20 1227  VITAMINB12 1,349*   Urine analysis:    Component Value Date/Time   COLORURINE YELLOW 11/20/2019 1006   APPEARANCEUR CLEAR 11/20/2019 1006   LABSPEC 1.013 11/20/2019 1006   PHURINE 7.0 11/20/2019 1006   GLUCOSEU NEGATIVE 11/20/2019 1006   HGBUR NEGATIVE 11/20/2019 1006   BILIRUBINUR NEGATIVE 11/20/2019 1006   BILIRUBINUR neg 05/30/2014 1538   KETONESUR NEGATIVE 11/20/2019 1006   PROTEINUR NEGATIVE 11/20/2019 1006   UROBILINOGEN >=8.0 05/30/2014 1538   NITRITE NEGATIVE 11/20/2019 1006   LEUKOCYTESUR NEGATIVE 11/20/2019 1006   Sepsis Labs: No results found for this or any previous visit (from the past 240 hour(s)).   Radiological Exams on Admission: CT Code Stroke CTA Head W/WO contrast  Result Date: 05/09/2020 CLINICAL DATA:  Unable to speak.  Stroke. EXAM: CT ANGIOGRAPHY HEAD AND NECK CT PERFUSION  BRAIN TECHNIQUE: Multidetector CT imaging of the head and neck was performed using the standard protocol during bolus administration of intravenous contrast. Multiplanar CT image reconstructions and MIPs were obtained to evaluate the vascular anatomy. Carotid stenosis measurements (when applicable) are obtained utilizing NASCET criteria, using the distal internal carotid diameter as the denominator. Multiphase CT imaging of the brain was performed following IV bolus contrast injection. Subsequent parametric perfusion maps were calculated using RAPID software. CONTRAST:  163mL OMNIPAQUE IOHEXOL 350 MG/ML SOLN COMPARISON:  CT head 05/09/2020.  CTA head 11/21/2019 FINDINGS: CTA NECK FINDINGS Aortic arch: Standard branching. Imaged portion shows no evidence of aneurysm or dissection. No significant stenosis of the major arch vessel origins. Right carotid system: Right carotid widely patent. Minimal atherosclerotic disease right carotid bulb. Left carotid system: Left carotid widely patent. Minimal atherosclerotic disease left carotid bulb. Vertebral arteries: Right vertebral artery dominant. Moderate stenosis proximal right vertebral artery unchanged. Remainder of the right vertebral artery is patent. Small left vertebral artery ends in PICA without focal stenosis. Skeleton: Cervical spondylosis.  No acute skeletal abnormality. Other neck: Negative for mass or adenopathy in the neck. Upper chest: Prominent apical blebs and emphysema. No acute abnormality. Review of the MIP images confirms the above findings CTA HEAD FINDINGS Anterior circulation: Mild atherosclerotic calcification in the cavernous carotid bilaterally. 2 mm left paraclinoid aneurysm projecting laterally is unchanged. Anterior and middle cerebral arteries widely patent without significant stenosis or occlusion. Posterior circulation: Right vertebral artery supplies the basilar which is patent. Right PICA patent. Left vertebral artery is small and ends in  PICA. Superior cerebellar and posterior cerebral arteries patent bilaterally without stenosis. Fetal origin right posterior cerebral artery. No aneurysm in the posterior circulation. Venous sinuses: Normal venous enhancement Anatomic variants: None Review of the MIP images confirms the above findings CT Brain Perfusion Findings: ASPECTS: 10 CBF (<30%) Volume: 64mL Perfusion (Tmax>6.0s) volume: 51mL Mismatch Volume: 45mL Infarction Location:None IMPRESSION: 1. CT perfusion negative for acute ischemia or infarction 2. Negative for intracranial large vessel occlusion or flow limiting stenosis 3. Both carotid arteries are widely patent in the neck with minimal atherosclerotic disease. 4. Moderate stenosis proximal right vertebral artery which is dominant. Small left vertebral artery ends in PICA 5. 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 Electronically Signed   By: Franchot Gallo M.D.   On: 05/09/2020 12:27   CT Code Stroke CTA Neck W/WO contrast  Result Date: 05/09/2020 CLINICAL DATA:  Unable to speak.  Stroke. EXAM: CT ANGIOGRAPHY HEAD AND NECK CT PERFUSION BRAIN TECHNIQUE: Multidetector CT imaging of the head and neck was performed using the standard protocol during bolus administration of intravenous contrast. Multiplanar CT image reconstructions and MIPs were obtained to evaluate the vascular anatomy. Carotid stenosis measurements (when applicable) are obtained utilizing NASCET criteria, using the distal internal carotid diameter as the denominator. Multiphase CT imaging of the brain was performed following IV bolus contrast injection. Subsequent parametric perfusion maps were calculated using RAPID software. CONTRAST:  171mL OMNIPAQUE IOHEXOL 350 MG/ML SOLN COMPARISON:  CT head 05/09/2020.  CTA head 11/21/2019 FINDINGS: CTA NECK FINDINGS Aortic arch: Standard branching. Imaged portion shows no evidence of aneurysm or dissection. No significant stenosis of the major  arch vessel origins. Right carotid system: Right carotid widely patent. Minimal atherosclerotic disease right carotid bulb. Left carotid system: Left carotid widely patent. Minimal atherosclerotic disease left carotid bulb. Vertebral arteries: Right vertebral artery dominant. Moderate stenosis proximal right vertebral artery unchanged. Remainder of the right vertebral artery is patent. Small left vertebral artery ends in PICA without focal stenosis. Skeleton: Cervical spondylosis.  No acute skeletal abnormality. Other neck: Negative for mass or adenopathy in the neck. Upper chest: Prominent apical blebs and emphysema. No acute abnormality. Review of the MIP images confirms the above findings CTA HEAD FINDINGS Anterior circulation: Mild atherosclerotic calcification in the cavernous carotid bilaterally. 2 mm left paraclinoid aneurysm projecting laterally is unchanged. Anterior and middle cerebral arteries widely patent without significant stenosis or occlusion. Posterior circulation: Right vertebral artery supplies the basilar which is patent. Right PICA patent. Left vertebral artery is small and ends in PICA. Superior cerebellar and posterior cerebral arteries patent bilaterally without stenosis. Fetal origin right posterior cerebral artery. No aneurysm in the posterior circulation. Venous sinuses: Normal venous enhancement Anatomic variants: None Review of the MIP images confirms the above findings CT Brain Perfusion Findings: ASPECTS: 10 CBF (<30%) Volume: 71mL Perfusion (Tmax>6.0s) volume: 41mL Mismatch Volume: 61mL Infarction Location:None IMPRESSION: 1. CT perfusion negative for acute ischemia or infarction 2. Negative for intracranial large vessel occlusion or flow limiting stenosis 3. Both carotid arteries are widely patent in the neck with minimal atherosclerotic disease. 4. Moderate stenosis proximal right vertebral artery which is dominant. Small left vertebral artery ends in PICA 5. 2 mm left para clinoid  internal carotid artery aneurysm unchanged from the prior CTA. Electronically Signed   By: Franchot Gallo M.D.   On: 05/09/2020 12:27   CT Code Stroke Cerebral Perfusion with contrast  Result Date: 05/09/2020 CLINICAL DATA:  Unable to speak.  Stroke. EXAM: CT ANGIOGRAPHY HEAD AND NECK CT PERFUSION BRAIN TECHNIQUE: Multidetector CT imaging of the head and neck was performed using the standard protocol during bolus administration of intravenous contrast. Multiplanar CT image reconstructions and MIPs were obtained to evaluate the vascular anatomy. Carotid stenosis measurements (when applicable) are obtained utilizing NASCET criteria, using the distal internal carotid diameter as the denominator. Multiphase CT imaging of the brain was performed following IV bolus contrast injection. Subsequent parametric perfusion maps were calculated using RAPID software. CONTRAST:  147mL OMNIPAQUE IOHEXOL 350 MG/ML SOLN COMPARISON:  CT head 05/09/2020.  CTA head 11/21/2019 FINDINGS: CTA NECK FINDINGS Aortic arch: Standard branching. Imaged portion shows no evidence of  aneurysm or dissection. No significant stenosis of the major arch vessel origins. Right carotid system: Right carotid widely patent. Minimal atherosclerotic disease right carotid bulb. Left carotid system: Left carotid widely patent. Minimal atherosclerotic disease left carotid bulb. Vertebral arteries: Right vertebral artery dominant. Moderate stenosis proximal right vertebral artery unchanged. Remainder of the right vertebral artery is patent. Small left vertebral artery ends in PICA without focal stenosis. Skeleton: Cervical spondylosis.  No acute skeletal abnormality. Other neck: Negative for mass or adenopathy in the neck. Upper chest: Prominent apical blebs and emphysema. No acute abnormality. Review of the MIP images confirms the above findings CTA HEAD FINDINGS Anterior circulation: Mild atherosclerotic calcification in the cavernous carotid bilaterally. 2 mm  left paraclinoid aneurysm projecting laterally is unchanged. Anterior and middle cerebral arteries widely patent without significant stenosis or occlusion. Posterior circulation: Right vertebral artery supplies the basilar which is patent. Right PICA patent. Left vertebral artery is small and ends in PICA. Superior cerebellar and posterior cerebral arteries patent bilaterally without stenosis. Fetal origin right posterior cerebral artery. No aneurysm in the posterior circulation. Venous sinuses: Normal venous enhancement Anatomic variants: None Review of the MIP images confirms the above findings CT Brain Perfusion Findings: ASPECTS: 10 CBF (<30%) Volume: 28mL Perfusion (Tmax>6.0s) volume: 73mL Mismatch Volume: 52mL Infarction Location:None IMPRESSION: 1. CT perfusion negative for acute ischemia or infarction 2. Negative for intracranial large vessel occlusion or flow limiting stenosis 3. Both carotid arteries are widely patent in the neck with minimal atherosclerotic disease. 4. Moderate stenosis proximal right vertebral artery which is dominant. Small left vertebral artery ends in PICA 5. 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 Electronically Signed   By: Franchot Gallo M.D.   On: 05/09/2020 12:27   DG Chest Portable 1 View  Result Date: 05/09/2020 CLINICAL DATA:  Unresponsive. EXAM: PORTABLE CHEST 1 VIEW COMPARISON:  November 20, 2019. FINDINGS: The heart size and mediastinal contours are within normal limits. Emphysematous disease is noted in the upper lobes. No consolidative process is noted. The visualized skeletal structures are unremarkable. IMPRESSION: No active disease. Emphysema (ICD10-J43.9). Electronically Signed   By: Marijo Conception M.D.   On: 05/09/2020 12:45   CT HEAD CODE STROKE WO CONTRAST  Result Date: 05/09/2020 CLINICAL DATA:  Code stroke.  Ataxia.  Unable to speak. EXAM: CT HEAD WITHOUT CONTRAST TECHNIQUE: Contiguous axial images were obtained  from the base of the skull through the vertex without intravenous contrast. COMPARISON:  CT head 11/20/2019 FINDINGS: Brain: Moderate atrophy with interval progression. Negative for hydrocephalus. Chronic microvascular ischemic changes in the white matter. Chronic lacunar infarctions in the internal capsule right greater than left unchanged. Chronic infarcts in the pons bilaterally. Negative for acute infarct, hemorrhage, mass. Vascular: Negative for hyperdense vessel Skull: Negative Sinuses/Orbits: Paranasal sinuses clear.  Negative orbit. Other: None ASPECTS ([place] Stroke Program Early CT Score) - Ganglionic level infarction (caudate, lentiform nuclei, internal capsule, insula, M1-M3 cortex): 7 - Supraganglionic infarction (M4-M6 cortex): 3 Total score (0-10 with 10 being normal): 10 IMPRESSION: 1. No acute abnormality 2. ASPECTS is 10 3. Moderate atrophy and moderate to advanced chronic ischemic changes. Electronically Signed   By: Franchot Gallo M.D.   On: 05/09/2020 12:16     EKG: Independently reviewed.  Sinus tachycardia 112 bpm with QTc 511  Assessment/Plan Episode of unresponsiveness CVA: Acute.  Patient presents after being found outside unresponsive and drooling.  He was never noted to have lost pulse or stop breathing.  No seizure activity reported.  Patient was seen as a code stroke neurology, but CT scan of the brain CTA head and neck negative for any acute abnormalities.  Neurology called and notified needed initial MRI imaging concerning for acute stroke.  Recommended checking blood cultures due to the scattered appearance of infarcts.. -Admit to a telemetry bed -Neurochecks -Check blood cultures -Check echocardiogram -PT/OT/speech consulted -Transitions of care for possible need of placement -Order placed for loop recorder to be interrogated -Appreciate neurology consultative services, we will follow for further recommendations  Symptomatic anemia: Hemoglobin 6 g/dL on admission.   Patient reported having blood in stools..  Stool guaiacs however were noted to be negative.  Patient was typed and screened and ordered to be transfused 1 unit of packed red blood cells. -Transfusion of 1 unit of packed red blood cells -H&H posttransfusion -[facility] consulted,  will follow-up for further recommendations  Elevated troponin: Acute.  Troponin mildly elevated at 41.  EKG similar to previous, but with faster rate.  Suspect secondary to demand in setting of anemia. -Continue to trend cardiac troponins -Follow-up echocardiogram  Acute kidney injury: Patient presents with creatinine elevated up to 2.47 with BUN 43.  Creatinine previously noted to be relatively within normal limits at 1.19 in 2020.  Urinalysis  -Add on a CK -Hold nephrotoxic agents -Gentle IV fluids at 75 mL/h x1 L -Recheck creatinine in a.m.  Hypokalemia: Acute.  Potassium 3.3 on admission. -Give 30 mEq of potassium chloride IV -Continue to monitor and replace as needed  Bedbugs: Patient has reportedly been lying in bed continuously and came in with bedbugs.  On physical exam patient had some findings suggestive possibly of scabies with rash in between the webs of his feet. -Contact precautions -Permethrin cream  History of stroke with residual weakness: Patient with previous stroke in December 2020 with acute/subacute bilateral cortical infarction of left postcentral gyrus/corona radiata in addition to punctate cortical infarct of the right parietal lobe; also noted to have remote infarcts and evidence of likely chronic microvascular ischemia.  Patient had negative hypercoagulable work-up at that time.  Systolic congestive heart failure: Patient appears to be hypovolemic at this time.  Chest x-ray otherwise noted to be clear.  Last EF noted to be 45 to 50% by echocardiogram in 10/2019. -Follow-up repeat echocardiogram  Essential hypertension: Home blood pressure medications include amlodipine 10 mg daily and  lisinopril-hydrochlorothiazide 20-25 mg daily. -Restart home blood pressure medications when medically appropriate.  Elevated ammonia: Ammonia level was just mildly elevated at 36, but liver enzymes noted to be typically within normal limits. -Continue to monitor  History of hepatitis C  Prolonged QT interval: QTc initially noted to be elevated at 511. -Correct electrolyte abnormality -Check EKG in a.m.  DVT prophylaxis: scds   Code Status: Full Family Communication: Sister updated over the phone. Disposition Plan: To be determined Consults called: none Admission status: observation  Norval Morton MD [facility] Pager [phone]   If 7PM-7AM, please contact night-coverage [email or web address] Password [facility]  05/09/2020, 2:56 PM

## 2020-05-10 ENCOUNTER — Inpatient Hospital Stay (HOSPITAL_COMMUNITY): Payer: Medicaid Other

## 2020-05-10 DIAGNOSIS — R4189 Other symptoms and signs involving cognitive functions and awareness: Secondary | ICD-10-CM

## 2020-05-10 DIAGNOSIS — R4182 Altered mental status, unspecified: Secondary | ICD-10-CM

## 2020-05-10 DIAGNOSIS — I6389 Other cerebral infarction: Secondary | ICD-10-CM

## 2020-05-10 DIAGNOSIS — I6340 Cerebral infarction due to embolism of unspecified cerebral artery: Principal | ICD-10-CM

## 2020-05-10 DIAGNOSIS — Z8673 Personal history of transient ischemic attack (TIA), and cerebral infarction without residual deficits: Secondary | ICD-10-CM

## 2020-05-10 LAB — RPR: RPR Ser Ql: NONREACTIVE

## 2020-05-10 LAB — CBC
HCT: 23.6 % — ABNORMAL LOW (ref 39.0–52.0)
Hemoglobin: 7 g/dL — ABNORMAL LOW (ref 13.0–17.0)
MCH: 23.2 pg — ABNORMAL LOW (ref 26.0–34.0)
MCHC: 29.7 g/dL — ABNORMAL LOW (ref 30.0–36.0)
MCV: 78.1 fL — ABNORMAL LOW (ref 80.0–100.0)
Platelets: 393 10*3/uL (ref 150–400)
RBC: 3.02 MIL/uL — ABNORMAL LOW (ref 4.22–5.81)
RDW: 17.5 % — ABNORMAL HIGH (ref 11.5–15.5)
WBC: 9.7 10*3/uL (ref 4.0–10.5)
nRBC: 0 % (ref 0.0–0.2)

## 2020-05-10 LAB — BASIC METABOLIC PANEL
Anion gap: 9 (ref 5–15)
BUN: 22 mg/dL — ABNORMAL HIGH (ref 6–20)
CO2: 24 mmol/L (ref 22–32)
Calcium: 8.8 mg/dL — ABNORMAL LOW (ref 8.9–10.3)
Chloride: 108 mmol/L (ref 98–111)
Creatinine, Ser: 1.56 mg/dL — ABNORMAL HIGH (ref 0.61–1.24)
GFR calc Af Amer: 57 mL/min — ABNORMAL LOW (ref >60)
GFR calc non Af Amer: 49 mL/min — ABNORMAL LOW (ref >60)
Glucose, Bld: 88 mg/dL (ref 70–99)
Potassium: 3.9 mmol/L (ref 3.5–5.1)
Sodium: 141 mmol/L (ref 135–145)

## 2020-05-10 LAB — CSF CELL COUNT WITH DIFFERENTIAL
RBC Count, CSF: 166 /mm3 — ABNORMAL HIGH
RBC Count, CSF: 2 /mm3 — ABNORMAL HIGH
Tube #: 1
Tube #: 4
WBC, CSF: 0 /mm3 (ref 0–5)
WBC, CSF: 1 /mm3 (ref 0–5)

## 2020-05-10 LAB — CRYPTOCOCCAL ANTIGEN, CSF: Crypto Ag: NEGATIVE

## 2020-05-10 LAB — GRAM STAIN: Gram Stain: NONE SEEN

## 2020-05-10 LAB — LIPID PANEL
Cholesterol: 114 mg/dL (ref 0–200)
HDL: 51 mg/dL (ref >40)
LDL Cholesterol: 52 mg/dL (ref 0–99)
Total CHOL/HDL Ratio: 2.2 RATIO
Triglycerides: 56 mg/dL (ref <150)
VLDL: 11 mg/dL (ref 0–40)

## 2020-05-10 LAB — GLUCOSE, CAPILLARY: Glucose-Capillary: 84 mg/dL (ref 70–99)

## 2020-05-10 LAB — TROPONIN I (HIGH SENSITIVITY)
Troponin I (High Sensitivity): 39 ng/L — ABNORMAL HIGH (ref <18)
Troponin I (High Sensitivity): 34 ng/L — ABNORMAL HIGH (ref <18)

## 2020-05-10 LAB — ECHOCARDIOGRAM COMPLETE

## 2020-05-10 LAB — HEMOGLOBIN AND HEMATOCRIT, BLOOD
HCT: 22.6 % — ABNORMAL LOW (ref 39.0–52.0)
Hemoglobin: 6.7 g/dL — CL (ref 13.0–17.0)

## 2020-05-10 LAB — PROTEIN AND GLUCOSE, CSF
Glucose, CSF: 69 mg/dL (ref 40–70)
Total  Protein, CSF: 36 mg/dL (ref 15–45)

## 2020-05-10 MED ORDER — RESOURCE THICKENUP CLEAR PO POWD
ORAL | Status: DC | PRN
  Filled 2020-05-10: qty 125

## 2020-05-10 MED ORDER — LIDOCAINE HCL (PF) 1 % IJ SOLN
5.0000 mL | Freq: Once | INTRAMUSCULAR | Status: DC
  Filled 2020-05-10 (×2): qty 5

## 2020-05-10 MED ORDER — LIDOCAINE HCL 1 % IJ SOLN
5.0000 mL | Freq: Once | INTRAMUSCULAR | Status: DC
  Filled 2020-05-10: qty 5

## 2020-05-10 MED ORDER — ASPIRIN EC 81 MG PO TBEC: 81.0000 mg | DELAYED_RELEASE_TABLET | Freq: Every day | ORAL | Status: DC

## 2020-05-10 MED ORDER — ASPIRIN 300 MG RE SUPP: 300.0000 mg | Freq: Once | RECTAL | Status: DC

## 2020-05-10 MED ORDER — ASPIRIN 325 MG PO TABS
325.0000 mg | ORAL_TABLET | Freq: Every day | ORAL | Status: DC
  Filled 2020-05-10: qty 1

## 2020-05-10 MED ORDER — ATORVASTATIN CALCIUM 10 MG PO TABS
10.0000 mg | ORAL_TABLET | Freq: Every day | ORAL | Status: DC
  Administered 2020-05-10 – 2020-05-18 (×9): 10 mg via ORAL
  Filled 2020-05-10 (×9): qty 1

## 2020-05-10 NOTE — Progress Notes (Addendum)
PROGRESS NOTE  Darren Pearson EHM:[id number] DOB: 1966/01/31 DOA: 05/09/2020 PCP: [name], NP  Brief History   The patient is a 54 yr old man who was found slumped by the side of his house. 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 He also carries history of hypertension, hyperlipidemia, hepatitis C, anxiety, depression. When he was found he was drooling and not responding. His sister mentioned that in the last 2-3 days the patient was changed. He had been refusing to eat, not taking his medications regularly, and not showering or getting out of bed. She reported that he did complain of having black stools. He has been using ibuprofen.   In the ED the patient was found to have hemoglobin of 6.0. He has been transfused with one unit of PRBC's. The patient's aspirin was held, and GI was consulted. FOBT was negative.  The patient has been admitted to a telemetry bed. Neurology was consulted. A stroke work-up was initiated.   Although CT head demonstrated only chronic infarcts, MRI did demonstrate small acute infarcts in the basal ganglia, left corona radiata, right occipital white matter, left parietal periventricular white matter, and bilateral frontal white matter. It is felt that this appearance is compatibile emboli. There was also advanced chronic small vessel ischemia.   CTA head and neck was also negative for acute ischemia or infarction, and no intracranial large vessel occlusion or flow limiting stenosis. Carotid arteries are patent. There is moderate stenosis of the proximal right vertebral artery. There is a 31mm left paraclinoid internal carotid artery aneurysm that is unchanged from prior CTA.   Echocardiogram has been completed, but has not yet been read.  Gastroenterology has evaluated the patient. They have cleared the patient to resume antiplatelet medication. They state that he should have endoscopy as outpatient  after he is recovered from current stroke.   Consultants  Neurology Gastroenterology Cardiology  Procedures  None  Antibiotics   Anti-infectives (From admission, onward)    None      Subjective  The patient is awake and alert. No new complaints. He denies that he has been non-compliant with medication.  Objective   Vitals:  Vitals:   05/10/20 0256 05/10/20 0722  BP: 114/68 112/79  Pulse:  68  Resp: 14 16  Temp: 98.3 F (36.8 C) 98.2 F (36.8 C)  SpO2: 100% 100%   Exam:  Constitutional:  The patient is awake, alert, and oriented x 3. No acute distress. Respiratory:  No increased work of breathing. No wheezes, rales, or rhonchi No tactile fremitus Cardiovascular:  Regular rate and rhythm No murmurs, ectopy, or gallups. No lateral PMI. No thrills. Abdomen:  Abdomen is soft, non-tender, non-distended No hernias, masses, or organomegaly Normoactive bowel sounds.  Musculoskeletal:  No cyanosis, clubbing, or edema Skin:  No rashes, lesions, ulcers palpation of skin: no induration or nodules Neurologic:  Unable to evaluate due to echocardiogram in progress. Psychiatric:  Mental status Mood, affect appropriate Orientation to person, place, time  judgment and insight appear intact  I have personally reviewed the following:   Today's Data  Vitals, BMP, CBC, Lipid panel  Micro Data  Blood cultures x 2 (05/09/2020): No growth  Imaging  CTA head and neck with perfusion study MRI Brain  Cardiology Data  Echocardiogram is pending. EKG  Scheduled Meds:  aspirin  325 mg Oral Daily   permethrin  1 application Topical Once   sodium chloride flush  3 mL Intravenous Q12H   Continuous Infusions:   Principal Problem:   Episode of unresponsiveness Active Problems:   Hepatitis C   CVA (cerebral vascular accident) ([place])   Hypokalemia   Hyperammonemia ([facility])   Symptomatic anemia   History of CVA (cerebrovascular accident)   Elevated troponin   LOS: 1  day   A & P  CVA: Acute. The patient was admitted to a telemetry bed. Neurology was consulted and stroke work-up was initiated. Although CT head demonstrated only chronic infarcts, MRI did demonstrate small acute infarcts in the basal ganglia, left corona radiata, right occipital white matter, left parietal periventricular white matter, and bilateral frontal white matter. It is felt that this appearance is compatibile with an embolic phenomenon. There was also advanced chronic small vessel ischemia. Echocardiogram, PT/OT eval are pending. TOC has been consulted to find placement. It is assumed that the patient will require SNF placement. Initially ASA was held due to the patient's anemia with Hgb of 6.0 and complaint of melena. GI has now stated that antithrombotics may be restarted.    Symptomatic anemia: Hemoglobin 6 g/dL on admission. The patient has received 1 unit of PRBC's in transfusion. FOBT performed in the ED and then repeated this morning on the floor have been negative for acute blood. Wyn Quaker this is due to acute blood loss in the setting of chronic anemia.The patient apparently had been using NSAIDS at home. GI has stated that they feel it is safe to resume the patient's antithrombotics.  Patient reported having blood in stools at home in the 2 weeks prior to presentation. Stool guaiacs however were noted to be negative both in ED and on the floor this morning.  Patient was typed and screened and ordered to be transfused 1 unit of packed red blood cells.    Elevated troponin: Acute. Troponin mildly elevated at 41.  EKG similar to previous, but with faster rate.  Suspect secondary to demand in setting of anemia.  They have continued to trend up. Will consult Cardiology and continue to follow troponins. Will also order repeat EKG.   Acute kidney injury: Patient presents with creatinine elevated up to 2.47 with BUN 43. Creatinine is down to 1.56 this morning. CK was negative. Continue to monitor  creatinine,  previously noted to be relatively within normal limits at 1.19 in 2020.  Urinalysis    Hypokalemia: Resolved. Potassium 3.3 on admission. 3.9 this morning. Monitor and replace as necessary.   Bedbugs: Patient has reportedly been lying in bed continuously and came in with bedbugs.  On physical exam patient had some findings suggestive possibly of scabies with rash in between the webs of his feet. Contact precautions are in place and the patient is being treated with permethrin cream.   History of stroke with residual weakness: Patient with previous stroke in December 2020 with acute/subacute bilateral cortical infarction of left postcentral gyrus/corona radiata in addition to punctate cortical infarct of the right parietal lobe; also noted to have remote infarcts and evidence of likely chronic microvascular ischemia.  Patient had negative hypercoagulable work-up at that time.   Systolic congestive heart failure: Patient appears to be hypovolemic at this time.  Chest x-ray otherwise noted to be clear.  Last EF noted to be 45 to 50% by echocardiogram in 10/2019. Repeat echocardiogram is pending.   Essential hypertension: Home blood pressure medications include amlodipine 10 mg daily and lisinopril-hydrochlorothiazide 20-25 mg daily. Will pursue a approach to BP control for the first 48 hours  post stroke. Monitor. Restart home blood pressure medications when medically appropriate.   Elevated ammonia/History of hepatitis C: Ammonia level was just mildly elevated at 36, but liver enzymes noted to be typically within normal limits. Clinically, no signs of HE this morning.   Prolonged QT interval: QTc initially noted to be elevated at 511. Will recheck EKG. Electrolyte abnormality has been corrected.  I have seen and examined this patient myself. I have spent 42 minutes in his evaluation and care.   DVT prophylaxis: scds   Code Status: Full Family Communication: None available Disposition  Plan: To be determined  Koen Antilla, DO [facility] Direct contact: see [email or web address]  7PM-7AM contact night coverage as above 05/10/2020, 1:07 PM  LOS: 1 day

## 2020-05-10 NOTE — Progress Notes (Addendum)
STROKE TEAM PROGRESS NOTE   INTERVAL HISTORY Sister and RN at bedside. Pt sitting in chair. As per sister, pt had cognitive impairment for a year also, short memory difficulty, confusion, more pronounced after stroke 6 months ago. For the last 3-4 weeks also, behavior changes, garbage all over the room, doing opposite ways when tell him to do things, crawling over the counter, refuse medications, etc. Pt lives with sister. Pt not orientated on exam, has some difficulty understand commands. Discussed LP to rule out CNS vasculitis, sister in agreement. Consent obtained from sister.    Vitals:   05/09/20 2357 05/10/20 0215 05/10/20 0256 05/10/20 0722  BP: 136/69 129/74 114/68 112/79  Pulse: 80 81  68  Resp: 16 16 14 16   Temp: 98.9 F (37.2 C) 98.3 F (36.8 C) 98.3 F (36.8 C) 98.2 F (36.8 C)  TempSrc: Oral Oral Oral Oral  SpO2: 100% 100% 100% 100%    CBC:  Recent Labs  Lab 05/09/20 1225 05/10/20 0410  WBC 8.4 9.7  NEUTROABS 6.9  --   HGB 6.0* 7.0*  HCT 21.6* 23.6*  MCV 77.1* 78.1*  PLT 491* 008    Basic Metabolic Panel:  Recent Labs  Lab 05/09/20 1225 05/10/20 0410  NA 141 141  K 3.3* 3.9  CL 108 108  CO2 20* 24  GLUCOSE 88 88  BUN 23* 22*  CREATININE 2.47* 1.56*  CALCIUM 9.0 8.8*   Lipid Panel:     Component Value Date/Time   CHOL 114 05/10/2020 0410   TRIG 56 05/10/2020 0410   HDL 51 05/10/2020 0410   CHOLHDL 2.2 05/10/2020 0410   VLDL 11 05/10/2020 0410   LDLCALC 52 05/10/2020 0410   HgbA1c:  Lab Results  Component Value Date   HGBA1C 4.2 (L) 11/22/2019   Urine Drug Screen:     Component Value Date/Time   LABOPIA NONE DETECTED 05/09/2020 1430   COCAINSCRNUR NONE DETECTED 05/09/2020 1430   LABBENZ NONE DETECTED 05/09/2020 1430   AMPHETMU NONE DETECTED 05/09/2020 1430   THCU NONE DETECTED 05/09/2020 1430   LABBARB NONE DETECTED 05/09/2020 1430    Alcohol Level     Component Value Date/Time   ETH <10 05/09/2020 1220    IMAGING past 24  hours MR Brain Wo Contrast (neuro protocol)  Result Date: 05/09/2020 CLINICAL DATA:  Altered mental status. EXAM: MRI HEAD WITHOUT CONTRAST TECHNIQUE: Multiplanar, multiecho pulse sequences of the brain and surrounding structures were obtained without intravenous contrast. COMPARISON:  Head CT, CTA, and CTP 05/09/2020.  Head MRI 11/20/2019. FINDINGS: Due to the patient's altered mental status, the examination was terminated prior to completion. Axial and coronal diffusion, sagittal T1, axial FLAIR, and axial T2 sequences were obtained and are motion degraded (including severe motion on the axial T2 sequence). Brain: There are small acute infarcts in the left basal ganglia, left corona radiata, right occipital white matter, left parietal periventricular white matter, and bilateral frontal white matter. The largest infarct is in the left corona radiata and measures 1.1 cm. There is a background of advanced chronic small vessel ischemia involving the cerebral white matter, incompletely evaluated on this limited, motion degraded examination. Chronic infarcts are again noted in the deep gray nuclei, pons, and cerebellum. There is age advanced cerebral atrophy. No gross intracranial hemorrhage, midline shift, or extra-axial fluid collection is identified. Vascular: More fully evaluated on earlier CTA. Skull and upper cervical spine: No destructive skull lesion. Sinuses/Orbits: Grossly unremarkable orbits. Paranasal sinuses and mastoid air cells are clear.  Other: None. IMPRESSION: 1. Motion degraded, incomplete examination. 2. Small acute bilateral cerebral infarcts compatible with emboli. 3. Advanced chronic small vessel ischemia. Electronically Signed   By: Logan Bores M.D.   On: 05/09/2020 18:15    PHYSICAL EXAM  Temp:  [98.2 F (36.8 C)-98.9 F (37.2 C)] 98.2 F (36.8 C) (06/17 0722) Pulse Rate:  [68-111] 68 (06/17 0722) Resp:  [14-20] 16 (06/17 0722) BP: (112-158)/(68-86) 112/79 (06/17 0722) SpO2:  [99  %-100 %] 100 % (06/17 0722)  General - Well nourished, well developed, in no apparent distress.  Ophthalmologic - fundi not visualized due to noncooperation.  Cardiovascular - Regular rhythm and rate.  Mental Status -  Level of arousal and orientation to place and person were intact. However, not orientated to time or age. Language exam showed paucity of speech, able to speak simple short sentences, but psychomotor slowing, naming 1/4 with perseveration, able to repeat, follows most single step commands, but not able to perform 2-step commands. Attention span and concentration were impaired Recent and remote memory were impaired Fund of Knowledge was assessed and was impaired  Cranial Nerves II - XII - II - Visual field intact OU. III, IV, VI - Extraocular movements intact. V - Facial sensation intact bilaterally. VII - Facial movement intact bilaterally. VIII - Hearing & vestibular intact bilaterally. X - Palate elevates symmetrically. XI - Chin turning & shoulder shrug intact bilaterally. XII - Tongue protrusion intact.  Motor Strength - The patient's strength was symmetrical in all extremities and pronator drift was absent. Left hand multiple finger amputation. Bulk was normal and fasciculations were absent.   Motor Tone - Muscle tone was assessed at the neck and appendages and was normal.  Reflexes - The patient's reflexes were symmetrical in all extremities and he had no pathological reflexes.  Sensory - Light touch, temperature/pinprick were assessed and were symmetrical.    Coordination - The patient had normal movements in the hands with no ataxia or dysmetria. However, FTN testing only can follow one step commands with touching nose but not touching finger. Tremor was absent.  Gait and Station - deferred.   ASSESSMENT/PLAN Mr. HAROUN COTHAM is a 54 y.o. male with history of anxiety and depression, hepatitis C, hypertension, stroke in 2020 with residual left-sided  hemiparesis requiring a cane to walk presenting with aphasia, not responding to questions.   Stroke: Small/punctate B multifocal cerebral infarct, embolic pattern, secondary to unknown source - with cognitive impairment, needs to rule out CNS vasculitis.  Code Stroke CT head No acute abnormality. Moderate Small vessel disease. Moderate Atrophy. ASPECTS 10.     CTA head & neck moderate proximal R VA stenosis. Stable 2 mm L para clinoid ICA aneurysm   CT perfusion no core or penumbra  MRI  Small punctate B cerebral infarcts. Advanced small vessel disease.   2D Echo pending  LDL 52  HgbA1c pending   UDS neg  Loop recorder interrogation pending  Will do LP for CSF analysis  SCDs for VTE prophylaxis  aspirin 81 mg daily prior to admission, now on aspirin 81 mg daily.   Therapy recommendations:  SNF   Disposition:  pending   Hx stroke/TIA  10/2019 - multifocal embolic punctate infarcts at left CR, L mesial temporal lobe, right parietal cortical, and right pontine infarcts - embolic pattern - secondary to unknown source. CTA head and neck unremarkable, DVT neg, EF 45-50%, TEE neg, no PFO, HIV and RPR neg, LDL 74 and A1C 4.2. ANA  neg, hypercoag neg, B12 170, put on DAPT x 3 weeks and Lipitor. Loop placed.   During 01/2020 f/u visit with Dr. Leonie Man, pt on Plavix per note.   Cognitive impairment  Pt not fully orientated  Sister stated memory difficulty, confusion for the last one year, especially worse after stroke 6 months ago  Recent behavoir changes  Will do LP to rule out CNS vasculitis  If neg, may be vascular dementia - given advanced chronic small vessel ischemia on CT and MRI  Severe anemia  Symptomatic anemia  Hgb 6.0->7.0  FOBT neg. guiac neg.   transfused 1 unit.   On NSAIDs at home.   Will give iron pills  AKI  AKI Cre 2.47->1.56  Not on IVF  BMP monitoring  Hypertension  Stable . Long-term BP goal normotensive  Hyperlipidemia  Home meds:   lipitor 40 (pt not taking)  LDL 52, at goal < 70  lipitor 10 added - no high intensity statin due to low level LDL at baseline  Continue statin at discharge  Other Stroke Risk Factors  Former Cigarette smoker - listed as current smoker in 10/2019 - smoking cessation provided.   Hx ETOH use, alcohol level <10, advised to drink no more than 2 drink(s) a day  Hx Substance abuse - THC, Cocaine   Other Active Problems  B12 deficiency, on supplements  Bed bugs, likely scabies  Hx hepatitis C, elevated NH4 = 39. LFTs normal.   Elevated troponin d/t demand of anemia  Hypokalemia   Prolonged QT  Hospital day # 1  Rosalin Hawking, MD PhD Stroke Neurology 05/10/2020 4:36 PM   To contact Stroke Continuity provider, please refer to [email or web address] After hours, contact General Neurology

## 2020-05-10 NOTE — NC FL2 (Signed)
[place] LEVEL OF CARE SCREENING TOOL     IDENTIFICATION  Patient Name: Darren Pearson Birthdate: 1966/11/06 Sex: male Admission Date (Current Location): 05/09/2020  [facility] and [place] Number:  Herbalist and Address:  [facility] [place], [place] [address] [place],  [address]      Provider Number: [id number]  Attending Physician Name and Address:  Karie Kirks, DO  Relative Name and Phone Number:  Glenetta Borg (Sister) [phone] [facility])    Current Level of Care: Hospital Recommended Level of Care: [place] Prior Approval Number:    Date Approved/Denied:   PASRR Number:    Discharge Plan: SNF    Current Diagnoses: Patient Active Problem List   Diagnosis Date Noted  . Episode of unresponsiveness 05/09/2020  . Symptomatic anemia 05/09/2020  . History of CVA (cerebrovascular accident) 05/09/2020  . Elevated troponin 05/09/2020  . CVA (cerebral vascular accident) ([address]) 11/20/2019  . Hypokalemia 11/20/2019  . Accelerated hypertension 11/20/2019  . Hyperammonemia ([place]) 11/20/2019  . Elevated serum creatinine 11/20/2019  . Elevated blood pressure 03/15/2015  . Rash and nonspecific skin eruption 03/15/2015  . Hepatitis C 04/12/2013  . Allergic rhinitis 04/12/2013  . Unspecified asthma(493.90) 04/12/2013    Orientation RESPIRATION BLADDER Height & Weight     Self, Place  Normal External catheter Weight:   Height:     BEHAVIORAL SYMPTOMS/MOOD NEUROLOGICAL BOWEL NUTRITION STATUS   (none)  (CVA) Continent Diet (See discharge summary)  AMBULATORY STATUS COMMUNICATION OF NEEDS Skin   Extensive Assist Verbally Normal                       Personal Care Assistance Level of Assistance  Bathing, Feeding, Dressing Bathing Assistance: Limited assistance Feeding assistance: Independent Dressing Assistance: Limited assistance     Functional Limitations Info  Sight, Hearing, Speech Sight Info:  Adequate Hearing Info: Adequate Speech Info: Adequate    SPECIAL CARE FACTORS FREQUENCY  PT (By licensed PT), OT (By licensed OT)     PT Frequency: 5x/week OT Frequency: 5x/week            Contractures Contractures Info: Not present    Additional Factors Info  Code Status, Allergies Code Status Info: Full Allergies Info: Milk-related, Sulfa Antibiotics           Current Medications (05/10/2020):  This is the current hospital active medication list Current Facility-Administered Medications  Medication Dose Route Frequency Provider Last Rate Last Admin  . acetaminophen (TYLENOL) tablet 650 mg  650 mg Oral Q6H PRN Norval Morton, MD       Or  . acetaminophen (TYLENOL) suppository 650 mg  650 mg Rectal Q6H PRN Smith, Rondell A, MD      . albuterol (PROVENTIL) (2.5 MG/3ML) 0.083% nebulizer solution 2.5 mg  2.5 mg Nebulization Q6H PRN Fuller Plan A, MD      . aspirin EC tablet 81 mg  81 mg Oral Daily Rosalin Hawking, MD      . hydrocortisone cream 1 % 1 application  1 application Topical BID PRN Smith, Rondell A, MD      . lidocaine (XYLOCAINE) 1 % (with pres) injection 5 mL  5 mL Intradermal Once Rosalin Hawking, MD      . permethrin (ELIMITE) 5 % cream 1 application  1 application Topical Once Norval Morton, MD      . Resource ThickenUp Clear   Oral PRN Swayze, Ava, DO      .  sodium chloride flush (NS) 0.9 % injection 3 mL  3 mL Intravenous Q12H Fuller Plan A, MD   3 mL at 05/10/20 7127     Discharge Medications: Please see discharge summary for a list of discharge medications.  Relevant Imaging Results:  Relevant Lab Results:   Additional Information SSN [place]  Tarkio Elbe, [place]

## 2020-05-10 NOTE — Evaluation (Signed)
Occupational Therapy Evaluation Patient Details Name: Darren Pearson MRN: [id number] DOB: [birth year] Today's Date: 05/10/2020    History of Present Illness Pt is 54 yo male who was found slumped by the side of his house and was brought to [facility].  Pt found to have small acute infarcts in the basal ganglia, left corona radiata, right occipital white matter, left parietal periventricular white matter, and bilateral frontal white matter.  Additionally, pt found to have hgb of 6.0 and has received PRBC with current hgb of 7.0. Pt with PMH of previous CVA in 11/2019 with residual left sided weakness.   Clinical Impression   PTA, pt was living at home with his sister, pt's sister reports pt has cognitive limitations at baseline and required some assistance with ADL/IADL and was independent with functional mobility. Pt currently limited by cognitive limitations, he required max mulitmodal cues for sequencing and navigating environment at RW level. Pt required cues for pacing during feeding, assistance to utilize correct utensil, and cues to initiate self-feeding. Due to decline in current level of function, pt would benefit from acute OT to address established goals to facilitate safe D/C to venue listed below. At this time, recommend SNF follow-up. Pt's sister reports she has already started thinking about long term care for pt and she is agreeable to SNF d/c. Will continue to follow acutely.     Follow Up Recommendations  SNF;Supervision/Assistance - 24 hour    Equipment Recommendations  3 in 1 bedside commode    Recommendations for Other Services       Precautions / Restrictions Precautions Precautions: Fall Restrictions Weight Bearing Restrictions: No      Mobility Bed Mobility Overal bed mobility: Needs Assistance Bed Mobility: Supine to Sit     Supine to sit: Min assist;HOB elevated     General bed mobility comments: Increased time; multimodal cues for complete transfer and  scooting forward.  Difficulty with commands to sit straight on EOB  Transfers Overall transfer level: Needs assistance Equipment used: Rolling walker (2 wheeled) Transfers: Sit to/from Stand Sit to Stand: Min assist         General transfer comment: Min assist for steadying; max cues and min A to sit safely    Balance Overall balance assessment: Needs assistance Sitting-balance support: No upper extremity supported Sitting balance-Leahy Scale: Good     Standing balance support: Bilateral upper extremity supported Standing balance-Leahy Scale: Fair Standing balance comment: required RW or HHA for balance                           ADL either performed or assessed with clinical judgement   ADL Overall ADL's : Needs assistance/impaired Eating/Feeding: Set up;Sitting Eating/Feeding Details (indicate cue type and reason): cues for pacing, safety, and use of proper utensil;pt utilizing whatever utensil was on left side (knife instead of fork) Grooming: Set up;Sitting   Upper Body Bathing: Minimal assistance;Sitting   Lower Body Bathing: Moderate assistance;Sit to/from stand   Upper Body Dressing : Minimal assistance;Sitting   Lower Body Dressing: Moderate assistance;Sit to/from stand Lower Body Dressing Details (indicate cue type and reason): pt attempted to don socks while in bed, assistance to don socks over all toes Toilet Transfer: Minimal assistance;RW;+2 for physical assistance;+2 for safety/equipment Toilet Transfer Details (indicate cue type and reason): assistance for proper management of RW Toileting- Clothing Manipulation and Hygiene: Moderate assistance;Sit to/from stand       Functional mobility during ADLs: Minimal assistance;+2  for physical assistance;+2 for safety/equipment;Rolling walker General ADL Comments: pt limited by generalized weakness, decreased cognition, visual limitations     Vision   Vision Assessment?: Yes Eye Alignment: Within  Functional Limits Visual Fields: Impaired-to be further tested in functional context Additional Comments: difficult to fully assess vision due to cognitive limitations;pt bumping into objects on right side during mobility;pt able to locate all items on food tray, appeared to have depth perception [facility]     Perception Perception Perception Tested?: Yes Perception Deficits: Color recognition;Object recognition Comments: difficulty correctly stating colors of objects;difficulty verbalizing object identification (corn, knife, fork)   Praxis Praxis Praxis tested?: Deficits Deficits: Perseveration;Initiation Praxis-Other Comments: pt perseverating on movements during MMT;pt perseverating on answering questions;he required cues to initiate feeding, putting on socks, and cutting chicken    Pertinent Vitals/Pain Pain Assessment: No/denies pain     Hand Dominance Left   Extremity/Trunk Assessment Upper Extremity Assessment Upper Extremity Assessment: RUE deficits/detail;LUE deficits/detail;Difficult to assess due to impaired cognition RUE Deficits / Details: generalized weakness; LUE Deficits / Details: 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 work, pt got fingers stuck in machine;pt able to use functionally, able to hold and manipulate fork/knife   Lower Extremity Assessment Lower Extremity Assessment: Defer to PT evaluation RLE Deficits / Details: ROM WFL; MMT: demonstrating at least 3/5 throughout with 5/5 knee ext; difficulty with other commands RLE Coordination: decreased gross motor (difficulty with heel to shin) LLE Deficits / Details: ROM WFL; MMT: demonstrating at least 3/5 throughout with 5/5 knee ext; difficulty with other commands LLE Coordination: decreased gross motor (difficulty with heel to shin task)   Cervical / Trunk Assessment Cervical / Trunk Assessment: Normal   Communication Communication Communication: No difficulties   Cognition Arousal/Alertness:  Awake/alert Behavior During Therapy: Flat affect Overall Cognitive Status: Impaired/Different from baseline Area of Impairment: Orientation;Safety/judgement;Problem solving;Following commands;Attention;Awareness;Memory                 Orientation Level: Person (Oriented to self and birth year only) Current Attention Level: Focused Memory: Decreased short-term memory Following Commands: Follows one step commands inconsistently Safety/Judgement: Decreased awareness of safety;Decreased awareness of deficits Awareness: Intellectual Problem Solving: Slow processing;Difficulty sequencing;Requires verbal cues;Requires tactile cues General Comments: pt's sister reports pt has come cognitive limitations at baseline, she reports he is typically confused and has short term memory limitations, requires assistance with ADL;pt currently perseverating at times;requires consistent cues for sequencing;pt followed one step commands <25% of time;decreased body awareness and decreased awareness of deficits;aphasia, decreased color identification   General Comments  Vision: denied blurred or double vision, difficulty testing visual fields but did not pt scanning environment and no apparant deficits.    Exercises     Shoulder Instructions      Home Living Family/patient expects to be discharged to:: Skilled nursing facility Living Arrangements: Other relatives;Other (Comment) (sister) Available Help at Discharge: Family;Available PRN/intermittently Type of Home: House Home Access: Stairs to enter Entrance Stairs-Number of Steps: 2   Home Layout: Two level Alternate Level Stairs-Number of Steps: flight Alternate Level Stairs-Rails:  (reports rail broke)           Home Equipment: Cane - single point;Bedside commode   Additional Comments: Pt's sister provided information above and PLOF as pt unable.      Prior Functioning/Environment Level of Independence: Needs assistance  Gait / Transfers  Assistance Needed: Pt was able to ambulate without AD.  Had not had a fall since last CVA in December. ADL's /  Homemaking Assistance Needed: Sister reports required assist with ADLs .  Reports she had a hard time getting him to bath and brush teeth.            OT Problem List: Decreased activity tolerance;Decreased strength;Impaired vision/perception;Impaired balance (sitting and/or standing);Decreased cognition;Decreased safety awareness;Decreased knowledge of use of DME or AE;Decreased knowledge of precautions      OT Treatment/Interventions: Self-care/ADL training;Energy conservation;DME and/or AE instruction;Therapeutic activities;Patient/family education;Balance training;Cognitive remediation/compensation;Visual/perceptual remediation/compensation    OT Goals(Current goals can be found in the care plan section) Acute Rehab OT Goals Patient Stated Goal: pt unable to state; sister agreeable to rehab OT Goal Formulation: With patient Time For Goal Achievement: 05/24/20 Potential to Achieve Goals: Fair ADL Goals Pt Will Perform Eating: with set-up;sitting Pt Will Perform Grooming: standing;with supervision Pt Will Perform Lower Body Dressing: with supervision;sit to/from stand Pt Will Transfer to Toilet: ambulating;with supervision  OT Frequency: Min 2X/week   Barriers to D/C: Decreased caregiver support  pt lives with sister, sister reports she have started thinking about long term care for pt       Co-evaluation PT/OT/SLP Co-Evaluation/Treatment: Yes Reason for Co-Treatment: For patient/therapist safety;To address functional/ADL transfers PT goals addressed during session: Mobility/safety with mobility;Balance OT goals addressed during session: ADL's and self-care      AM-PAC OT "6 Clicks" Daily Activity     Outcome Measure Help from another person eating meals?: A Little Help from another person taking care of personal grooming?: A Little Help from another person  toileting, which includes using toliet, bedpan, or urinal?: A Lot Help from another person bathing (including washing, rinsing, drying)?: A Little Help from another person to put on and taking off regular upper body clothing?: A Little Help from another person to put on and taking off regular lower body clothing?: A Lot 6 Click Score: 16   End of Session Equipment Utilized During Treatment: Gait belt;Rolling walker Nurse Communication: Mobility status  Activity Tolerance: Patient tolerated treatment well Patient left: in chair;with call bell/phone within reach;with chair alarm set  OT Visit Diagnosis: Unsteadiness on feet (R26.81);Other abnormalities of gait and mobility (R26.89);Muscle weakness (generalized) (M62.81);Other symptoms and signs involving cognitive function                Time: 3094-0768 OT Time Calculation (min): 40 min Charges:  OT General Charges $OT Visit: 1 Visit OT Evaluation $OT Eval Moderate Complexity: 1 Mod OT Treatments $Self Care/Home Management : 8-22 mins  Helene Kelp OTR/L Acute Rehabilitation Services Office: [place] 05/10/2020, 3:30 PM

## 2020-05-10 NOTE — Progress Notes (Signed)
Pt extremely slow to wake up after blood administration. Initially, patient was not opening his eyes and was not moving away from sternal rub. After continuing to try to wake him, patient began to slightly move away from sternal rub. Rapid response was called and the patient responded "Um huh" to him. Patient has not spoken any words as he was doing when I admitted him earlier tonight. Final temp was 98.3 oral, BP: 129/75, HR: 81, Respirations 14, O2: 100% on room air. Blood sugar is 84. Dr. Sharlet Salina notified and will continue to monitor.

## 2020-05-10 NOTE — Evaluation (Signed)
Physical Therapy Evaluation Patient Details Name: Darren Pearson MRN: [id number] DOB: 07-30-1966 Today's Date: 05/10/2020   History of Present Illness  Pt is 54 yo male who was found slumped by the side of his house and was brought to [facility].  Pt found to have small acute infarcts in the basal ganglia, left corona radiata, right occipital white matter, left parietal periventricular white matter, and bilateral frontal white matter.  Additionally, pt found to have hgb of 6.0 and has received PRBC with current hgb of 7.0. Pt with PMH of previous CVA in 11/2019 with residual left sided weakness.  Clinical Impression  Pt admitted with above diagnosis. Pt demonstrating fair strength but was difficult to completely assess due to confusion.  He did require max cues for safety and sequencing.  He was able to ambulate 67' with min A.  He does have decreased balance, safety, and coordination.  Largely limited by cognitive deficits that per sister are worse at this time.   Pt's sister reports she was having difficulty caring for him at home and is interested in placement at d/c, particularly since his cognitive deficits and mobility have worsened.  Pt currently with functional limitations due to the deficits listed below (see PT Problem List). Pt will benefit from skilled PT to increase their independence and safety with mobility to allow discharge to the venue listed below.       Follow Up Recommendations SNF    Equipment Recommendations  Rolling walker with 5" wheels    Recommendations for Other Services       Precautions / Restrictions Precautions Precautions: Fall      Mobility  Bed Mobility Overal bed mobility: Needs Assistance Bed Mobility: Supine to Sit     Supine to sit: Min assist;HOB elevated     General bed mobility comments: Increased time; multimodal cues for complete transfer and scooting forward.  Difficulty with commands to sit straight on EOB  Transfers Overall transfer  level: Needs assistance Equipment used: Rolling walker (2 wheeled) Transfers: Sit to/from Stand Sit to Stand: Min assist         General transfer comment: Min assist for steadying; max cues and min A to sit safely  Ambulation/Gait Ambulation/Gait assistance: Min assist Gait Distance (Feet): 35 Feet Assistive device: Rolling walker (2 wheeled);None Gait Pattern/deviations: Decreased stride length;Narrow base of support Gait velocity: decreased   General Gait Details: With RW pt with difficulty navigating around objects on both sides, attempted 10' without RW.  Pt required min A for steadying with and without RW  Stairs            Wheelchair Mobility    Modified Rankin (Stroke Patients Only) Modified Rankin (Stroke Patients Only) Pre-Morbid Rankin Score: Moderately severe disability Modified Rankin: Moderately severe disability     Balance Overall balance assessment: Needs assistance Sitting-balance support: No upper extremity supported Sitting balance-Leahy Scale: Good     Standing balance support: Bilateral upper extremity supported Standing balance-Leahy Scale: Fair Standing balance comment: required RW or HHA for balance                             Pertinent Vitals/Pain Pain Assessment: No/denies pain    Home Living Family/patient expects to be discharged to:: Skilled nursing facility Living Arrangements: Other relatives;Other (Comment) (sister) Available Help at Discharge: Family;Available PRN/intermittently Type of Home: House Home Access: Stairs to enter   Entrance Stairs-Number of Steps: 2 Home Layout: Two level  Home Equipment: Kasandra Knudsen - single point;Bedside commode Additional Comments: Pt's sister provided information above and PLOF as pt unable.    Prior Function Level of Independence: Needs assistance   Gait / Transfers Assistance Needed: Pt was able to ambulate without AD.  Had not had a fall since last CVA in December.  ADL's /  Homemaking Assistance Needed: Sister reports required assist with ADLs .  Reports she had a hard time getting him to bath and brush teeth.        Hand Dominance        Extremity/Trunk Assessment   Upper Extremity Assessment Upper Extremity Assessment: Defer to OT evaluation    Lower Extremity Assessment Lower Extremity Assessment: Difficult to assess due to impaired cognition;LLE deficits/detail;RLE deficits/detail RLE Deficits / Details: ROM WFL; MMT: demonstrating at least 3/5 throughout with 5/5 knee ext; difficulty with other commands RLE Coordination: decreased gross motor (difficulty with heel to shin) LLE Deficits / Details: ROM WFL; MMT: demonstrating at least 3/5 throughout with 5/5 knee ext; difficulty with other commands LLE Coordination: decreased gross motor (difficulty with heel to shin task)    Cervical / Trunk Assessment Cervical / Trunk Assessment: Normal  Communication   Communication: No difficulties  Cognition Arousal/Alertness: Awake/alert Behavior During Therapy: Flat affect Overall Cognitive Status: Impaired/Different from baseline Area of Impairment: Orientation;Safety/judgement;Problem solving;Following commands;Attention;Awareness;Memory                 Orientation Level: Person (Oriented to self and birth year only)   Memory: Decreased short-term memory Following Commands: Follows one step commands inconsistently Safety/Judgement: Decreased awareness of safety;Decreased awareness of deficits   Problem Solving: Slow processing;Difficulty sequencing;Requires verbal cues;Requires tactile cues General Comments: Pt did have impairment at baseline - sister reports issues with confusion and short term memory and requiring assist with ADLs. However, now with further deficits.  Additionally tending to perserverate at times.      General Comments General comments (skin integrity, edema, etc.): Vision: denied blurred or double vision, difficulty  testing visual fields but did not pt scanning environment and no apparant deficits.    Exercises     Assessment/Plan    PT Assessment Patient needs continued PT services  PT Problem List Decreased strength;Decreased mobility;Decreased safety awareness;Decreased range of motion;Decreased coordination;Decreased activity tolerance;Decreased cognition;Decreased balance;Decreased knowledge of use of DME       PT Treatment Interventions DME instruction;Therapeutic activities;Cognitive remediation;Gait training;Therapeutic exercise;Patient/family education;Stair training;Balance training;Functional mobility training;Neuromuscular re-education    PT Goals (Current goals can be found in the Care Plan section)  Acute Rehab PT Goals Patient Stated Goal: pt unable to state; sister agreeable to rehab PT Goal Formulation: With patient/family Time For Goal Achievement: 05/24/20 Potential to Achieve Goals: Fair    Frequency Min 3X/week   Barriers to discharge Decreased caregiver support;Inaccessible home environment      Co-evaluation PT/OT/SLP Co-Evaluation/Treatment: Yes Reason for Co-Treatment: For patient/therapist safety (initial eval - per chart pt had been confused and lethargic with residual CVA weakness) PT goals addressed during session: Mobility/safety with mobility;Balance OT goals addressed during session: ADL's and self-care       AM-PAC PT "6 Clicks" Mobility  Outcome Measure Help needed turning from your back to your side while in a flat bed without using bedrails?: A Little Help needed moving from lying on your back to sitting on the side of a flat bed without using bedrails?: A Little Help needed moving to and from a bed to a chair (including a wheelchair)?: A Little Help  needed standing up from a chair using your arms (e.g., wheelchair or bedside chair)?: A Little Help needed to walk in hospital room?: A Little Help needed climbing 3-5 steps with a railing? : A Lot 6  Click Score: 17    End of Session Equipment Utilized During Treatment: Gait belt Activity Tolerance: Patient tolerated treatment well Patient left: with chair alarm set;in chair;with family/visitor present;with call bell/phone within reach Nurse Communication: Mobility status PT Visit Diagnosis: Unsteadiness on feet (R26.81);Muscle weakness (generalized) (M62.81)    Time: 8138-8719 PT Time Calculation (min) (ACUTE ONLY): 37 min   Charges:   PT Evaluation $PT Eval Moderate Complexity: 1 Melina Schools, PT Acute Rehab Services Pager [phone] [facility] [phone]    Karlton Lemon 05/10/2020, 3:03 PM

## 2020-05-10 NOTE — Consult Note (Addendum)
Cardiology Consultation:   Patient ID: Darren Pearson; [id number]; 02-06-1966   Admit date: 05/09/2020 Date of Consult: 05/10/2020  Primary Care Provider: [name], NP Primary Cardiologist: No primary care provider on file.  Primary Electrophysiologist:  Thompson Grayer, MD 11/22/2019   Patient Profile:   Darren Pearson is a 54 y.o. male with a hx of HTN, Hep C, CVA 10/2019, mild LVD, who is being seen today for the evaluation of elevated troponin at the request of Dr Benny Lennert.  History of Present Illness:   [name] was admitted w/ CVA 10/2019, acute punctate bilateral cortical infarcts on MRI. Dr Rayann Heman saw 11/22/2019 for loop recorder, TEE was ok. So far, no arrhythmias.   [name]   to the hospital last p.m. because he was found down outside.  He was drooling and not responding.  No seizures were witnessed.  It was noted by his family that he had not been taking medications regularly or getting out of bed.  His hemoglobin was 6, he was transfused 1 unit with improvement. CT was negative for any acute abnormality, but MRI showed small acute infarcts in multiple areas, compatible with an embolic event.  His troponin was checked and was elevated, cardiology was asked to evaluate him.    [name] answers mainly yes or no questions, but does respond to his name and is oriented to place.  [name] states that he has not had chest pain.   He denies shortness of breath, states he has not had any trouble sleeping.  He denies lower extremity edema.  He has not had any palpitations.  He has not lost consciousness.  He denies any blood loss.  He denies dark tarry stools.  When asked about being found outside, he says nothing.  No other significant information is available from him.   Past Medical History:  Diagnosis Date  . Allergy   . Anxiety and depression   . Asthma   . Hepatitis C   . History of chicken pox   . Hypertension     Past Surgical History:    Procedure Laterality Date  . BUBBLE STUDY  11/22/2019   Procedure: BUBBLE STUDY;  Surgeon: Dorothy Spark, MD;  Location: [facility];  Service: Cardiovascular;;  . LOOP RECORDER INSERTION N/A 11/22/2019   Procedure: LOOP RECORDER INSERTION;  Surgeon: Thompson Grayer, MD;  Location: [facility];  Service: Cardiovascular;  Laterality: N/A;  . TEE WITHOUT CARDIOVERSION N/A 11/22/2019   Procedure: TRANSESOPHAGEAL ECHOCARDIOGRAM (TEE);  Surgeon: Dorothy Spark, MD;  Location[facility] ENDOSCOPY;  Service: Cardiovascular;  Laterality: N/A;     Prior to Admission medications   Medication Sig Start Date End Date Taking? Authorizing Provider  acetaminophen (TYLENOL) 325 MG tablet Take 650 mg by mouth every 6 (six) hours as needed for mild pain or headache.   Yes [provider]  amLODipine (NORVASC) 10 MG tablet Take 1 tablet (10 mg total) by mouth daily. 04/09/20  Yes [name], NP  aspirin 81 MG EC tablet Take 1 tablet (81 mg total) by mouth daily. 11/24/19  Yes Kathie Dike, MD  cyanocobalamin 1000 MCG tablet Take 1 tablet (1,000 mcg total) by mouth daily. 11/24/19  Yes Kathie Dike, MD  lisinopril-hydrochlorothiazide (ZESTORETIC) 20-25 MG tablet Take 1 tablet by mouth daily. 04/09/20  Yes [name], NP  atorvastatin (LIPITOR) 40 MG tablet Take 1 tablet (40 mg total) by mouth daily at 6 PM. Patient not taking: Reported on  05/09/2020 11/23/19   Kathie Dike, MD    Inpatient Medications: Scheduled Meds: . aspirin EC  81 mg Oral Daily  . lidocaine (PF)  5 mL Intradermal Once  . permethrin  1 application Topical Once  . sodium chloride flush  3 mL Intravenous Q12H   Continuous Infusions:  PRN Meds: acetaminophen **OR** acetaminophen, albuterol, hydrocortisone cream, Resource ThickenUp Clear  Allergies:    Allergies  Allergen Reactions  . Milk-Related Compounds Other (See Comments)    Caused congestion, as a child  . Sulfa Antibiotics Hives     Social History:   Social History   Socioeconomic History  . Marital status: Single    Spouse name: Not on file  . Number of children: Not on file  . Years of education: 42  . Highest education level: Not on file  Occupational History  . Occupation: Programmer, applications: [employer]  Tobacco Use  . Smoking status: Former Smoker    Packs/day: 0.25    Types: Cigarettes    Start date: 11/24/2012  . Smokeless tobacco: Never Used  . Tobacco comment: has not smoked since 11/20/2019  Vaping Use  . Vaping Use: Former  Substance and Sexual Activity  . Alcohol use: Not Currently    Alcohol/week: 2.0 standard drinks    Types: 2 Cans of beer per week    Comment: social, states stopped on 11/20/2019.  Marland Kitchen Drug use: Not Currently    Frequency: 5.0 times per week    Types: Marijuana    Comment: Stop 11/20/2019  . Sexual activity: Yes    Birth control/protection: Condom  Other Topics Concern  . Not on file  Social History Narrative   Regular exercise-yes   Social Determinants of Health   Financial Resource Strain:   . Difficulty of Paying Living Expenses:   Food Insecurity:   . Worried About Charity fundraiser in the Last Year:   . Arboriculturist in the Last Year:   Transportation Needs: No Transportation Needs  . Lack of Transportation (Medical): No  . Lack of Transportation (Non-Medical): No  Physical Activity:   . Days of Exercise per Week:   . Minutes of Exercise per Session:   Stress:   . Feeling of Stress :   Social Connections:   . Frequency of Communication with Friends and Family:   . Frequency of Social Gatherings with Friends and Family:   . Attends Religious Services:   . Active Member of Clubs or Organizations:   . Attends Archivist Meetings:   Marland Kitchen Marital Status:   Intimate Partner Violence:   . Fear of Current or Ex-Partner:   . Emotionally Abused:   Marland Kitchen Physically Abused:   . Sexually Abused:     Family History:    Family History  Problem Relation Age of Onset  . Stomach cancer Mother   . Diabetes Father   . Paget's disease of bone Father    Family Status:  Family Status  Relation Name Status  . Mother  Deceased  . Father  Deceased  . Sister  Alive    ROS:  Please see the history of present illness.  ROS reviewed as the patient is able to respond and are negative.     Physical Exam/Data:   Vitals:   05/10/20 0215 05/10/20 0256 05/10/20 0722 05/10/20 1611  BP: 129/74 114/68 112/79 134/82  Pulse: 81  68 91  Resp: 16 14 16    Temp:  98.3 F (36.8 C) 98.3 F (36.8 C) 98.2 F (36.8 C) 98.4 F (36.9 C)  TempSrc: Oral Oral Oral Oral  SpO2: 100% 100% 100% 100%    Intake/Output Summary (Last 24 hours) at 05/10/2020 1619 Last data filed at 05/10/2020 1600 Gross per 24 hour  Intake 948.76 ml  Output 1175 ml  Net -226.24 ml    Last 3 Weights 04/09/2020 01/31/2020 12/28/2019  Weight (lbs) 155 lb 12.8 oz 151 lb 154 lb  Weight (kg) 70.67 kg 68.493 kg 69.854 kg     There is no height or weight on file to calculate BMI.   General:  Well nourished, well developed, male in no acute distress HEENT: normal Lymph: no adenopathy Neck: JVD -not elevated Endocrine:  No thryomegaly Vascular: No carotid bruits; 4/4 extremity pulses 2+  Cardiac:  normal S1, S2; RRR; no murmur Lungs:  clear bilaterally, no wheezing, rhonchi or rales  Abd: soft, nontender, no hepatomegaly  Ext: no edema, multiple finger amputations on the left Musculoskeletal:  No deformities, BUE and BLE strength weak but equal Skin: warm and dry  Neuro:  CNs 2-12 intact, no focal abnormalities noted Psych:  Normal affect   EKG:  The EKG was personally reviewed and demonstrates: Sinus rhythm, right bundle is old, heart rate 90 Telemetry:  Telemetry was personally reviewed and demonstrates: Sinus rhythm, 5 beats nonsustained VT   CV studies:   ECHO: 11/21/2019 1. Left ventricular ejection fraction, by visual estimation, is 45  to  50%. The left ventricle has low normal function. There is moderately increased left ventricular hypertrophy.  2. Left ventricular diastolic parameters are consistent with Grade I diastolic dysfunction (impaired relaxation).  3. The left ventricle demonstrates global hypokinesis.  4. Global right ventricle has mildly reduced systolic function.The right ventricular size is normal. No increase in right ventricular wall thickness.  5. Left atrial size was normal.  6. Right atrial size was normal.  7. The mitral valve is grossly normal. Trivial mitral valve  regurgitation.  8. The tricuspid valve is normal in structure.  9. The aortic valve is tricuspid. Aortic valve regurgitation is not  visualized. Mild aortic valve sclerosis without stenosis.  10. The pulmonic valve was grossly normal. Pulmonic valve regurgitation is  not visualized.  11. The inferior vena cava is normal in size with greater than 50%  respiratory variability, suggesting right atrial pressure of 3 mmHg.  12. The interatrial septum was not well visualized.   Laboratory Data:   Chemistry Recent Labs  Lab 05/09/20 1222 05/09/20 1225 05/10/20 0410  NA 144 141 141  K 3.3* 3.3* 3.9  CL 109 108 108  CO2  --  20* 24  GLUCOSE 84 88 88  BUN 26* 23* 22*  CREATININE 2.60* 2.47* 1.56*  CALCIUM  --  9.0 8.8*  GFRNONAA  --  28* 49*  GFRAA  --  33* 57*  ANIONGAP  --  13 9    Lab Results  Component Value Date   ALT 14 05/09/2020   AST 23 05/09/2020   ALKPHOS 67 05/09/2020   BILITOT 0.5 05/09/2020   Hematology Recent Labs  Lab 05/09/20 1222 05/09/20 1225 05/10/20 0410  WBC  --  8.4 9.7  RBC  --  2.80* 3.02*  HGB 7.1* 6.0* 7.0*  HCT 21.0* 21.6* 23.6*  MCV  --  77.1* 78.1*  MCH  --  21.4* 23.2*  MCHC  --  27.8* 29.7*  RDW  --  16.4* 17.5*  PLT  --  491* 393   Cardiac Enzymes High Sensitivity Troponin:   Recent Labs  Lab 05/09/20 1225 05/09/20 1419 05/10/20 1357  TROPONINIHS 41* 96* 39*       BNPNo results for input(s): BNP, PROBNP in the last 168 hours.   TSH:  Lab Results  Component Value Date   TSH 1.296 05/09/2020   Lipids: Lab Results  Component Value Date   CHOL 114 05/10/2020   HDL 51 05/10/2020   LDLCALC 52 05/10/2020   TRIG 56 05/10/2020   CHOLHDL 2.2 05/10/2020   HgbA1c: Lab Results  Component Value Date   HGBA1C 4.2 (L) 11/22/2019   Magnesium: No results found for: MG   Radiology/Studies:  CT Code Stroke CTA Head W/WO contrast  Result Date: 05/09/2020 CLINICAL DATA:  Unable to speak.  Stroke. EXAM: CT ANGIOGRAPHY HEAD AND NECK CT PERFUSION BRAIN TECHNIQUE: Multidetector CT imaging of the head and neck was performed using the standard protocol during bolus administration of intravenous contrast. Multiplanar CT image reconstructions and MIPs were obtained to evaluate the vascular anatomy. Carotid stenosis measurements (when applicable) are obtained utilizing NASCET criteria, using the distal internal carotid diameter as the denominator. Multiphase CT imaging of the brain was performed following IV bolus contrast injection. Subsequent parametric perfusion maps were calculated using RAPID software. CONTRAST:  122mL OMNIPAQUE IOHEXOL 350 MG/ML SOLN COMPARISON:  CT head 05/09/2020.  CTA head 11/21/2019 FINDINGS: CTA NECK FINDINGS Aortic arch: Standard branching. Imaged portion shows no evidence of aneurysm or dissection. No significant stenosis of the major arch vessel origins. Right carotid system: Right carotid widely patent. Minimal atherosclerotic disease right carotid bulb. Left carotid system: Left carotid widely patent. Minimal atherosclerotic disease left carotid bulb. Vertebral arteries: Right vertebral artery dominant. Moderate stenosis proximal right vertebral artery unchanged. Remainder of the right vertebral artery is patent. Small left vertebral artery ends in PICA without focal stenosis. Skeleton: Cervical spondylosis.  No acute skeletal abnormality.  Other neck: Negative for mass or adenopathy in the neck. Upper chest: Prominent apical blebs and emphysema. No acute abnormality. Review of the MIP images confirms the above findings CTA HEAD FINDINGS Anterior circulation: Mild atherosclerotic calcification in the cavernous carotid bilaterally. 2 mm left paraclinoid aneurysm projecting laterally is unchanged. Anterior and middle cerebral arteries widely patent without significant stenosis or occlusion. Posterior circulation: Right vertebral artery supplies the basilar which is patent. Right PICA patent. Left vertebral artery is small and ends in PICA. Superior cerebellar and posterior cerebral arteries patent bilaterally without stenosis. Fetal origin right posterior cerebral artery. No aneurysm in the posterior circulation. Venous sinuses: Normal venous enhancement Anatomic variants: None Review of the MIP images confirms the above findings CT Brain Perfusion Findings: ASPECTS: 10 CBF (<30%) Volume: 27mL Perfusion (Tmax>6.0s) volume: 75mL Mismatch Volume: 47mL Infarction Location:None IMPRESSION: 1. CT perfusion negative for acute ischemia or infarction 2. Negative for intracranial large vessel occlusion or flow limiting stenosis 3. Both carotid arteries are widely patent in the neck with minimal atherosclerotic disease. 4. Moderate stenosis proximal right vertebral artery which is dominant. Small left vertebral artery ends in PICA 5. 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 Electronically Signed   By: Franchot Gallo M.D.   On: 05/09/2020 12:27   CT Code Stroke CTA Neck W/WO contrast  Result Date: 05/09/2020 CLINICAL DATA:  Unable to speak.  Stroke. EXAM: CT ANGIOGRAPHY HEAD AND NECK CT PERFUSION BRAIN TECHNIQUE: Multidetector CT imaging of the head and neck was performed using the standard  protocol during bolus administration of intravenous contrast. Multiplanar CT image reconstructions and MIPs were obtained to  evaluate the vascular anatomy. Carotid stenosis measurements (when applicable) are obtained utilizing NASCET criteria, using the distal internal carotid diameter as the denominator. Multiphase CT imaging of the brain was performed following IV bolus contrast injection. Subsequent parametric perfusion maps were calculated using RAPID software. CONTRAST:  134mL OMNIPAQUE IOHEXOL 350 MG/ML SOLN COMPARISON:  CT head 05/09/2020.  CTA head 11/21/2019 FINDINGS: CTA NECK FINDINGS Aortic arch: Standard branching. Imaged portion shows no evidence of aneurysm or dissection. No significant stenosis of the major arch vessel origins. Right carotid system: Right carotid widely patent. Minimal atherosclerotic disease right carotid bulb. Left carotid system: Left carotid widely patent. Minimal atherosclerotic disease left carotid bulb. Vertebral arteries: Right vertebral artery dominant. Moderate stenosis proximal right vertebral artery unchanged. Remainder of the right vertebral artery is patent. Small left vertebral artery ends in PICA without focal stenosis. Skeleton: Cervical spondylosis.  No acute skeletal abnormality. Other neck: Negative for mass or adenopathy in the neck. Upper chest: Prominent apical blebs and emphysema. No acute abnormality. Review of the MIP images confirms the above findings CTA HEAD FINDINGS Anterior circulation: Mild atherosclerotic calcification in the cavernous carotid bilaterally. 2 mm left paraclinoid aneurysm projecting laterally is unchanged. Anterior and middle cerebral arteries widely patent without significant stenosis or occlusion. Posterior circulation: Right vertebral artery supplies the basilar which is patent. Right PICA patent. Left vertebral artery is small and ends in PICA. Superior cerebellar and posterior cerebral arteries patent bilaterally without stenosis. Fetal origin right posterior cerebral artery. No aneurysm in the posterior circulation. Venous sinuses: Normal venous  enhancement Anatomic variants: None Review of the MIP images confirms the above findings CT Brain Perfusion Findings: ASPECTS: 10 CBF (<30%) Volume: 29mL Perfusion (Tmax>6.0s) volume: 35mL Mismatch Volume: 66mL Infarction Location:None IMPRESSION: 1. CT perfusion negative for acute ischemia or infarction 2. Negative for intracranial large vessel occlusion or flow limiting stenosis 3. Both carotid arteries are widely patent in the neck with minimal atherosclerotic disease. 4. Moderate stenosis proximal right vertebral artery which is dominant. Small left vertebral artery ends in PICA 5. 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 Electronically Signed   By: Franchot Gallo M.D.   On: 05/09/2020 12:27   MR Brain Wo Contrast (neuro protocol)  Result Date: 05/09/2020 CLINICAL DATA:  Altered mental status. EXAM: MRI HEAD WITHOUT CONTRAST TECHNIQUE: Multiplanar, multiecho pulse sequences of the brain and surrounding structures were obtained without intravenous contrast. COMPARISON:  Head CT, CTA, and CTP 05/09/2020.  Head MRI 11/20/2019. FINDINGS: Due to the patient's altered mental status, the examination was terminated prior to completion. Axial and coronal diffusion, sagittal T1, axial FLAIR, and axial T2 sequences were obtained and are motion degraded (including severe motion on the axial T2 sequence). Brain: There are small acute infarcts in the left basal ganglia, left corona radiata, right occipital white matter, left parietal periventricular white matter, and bilateral frontal white matter. The largest infarct is in the left corona radiata and measures 1.1 cm. There is a background of advanced chronic small vessel ischemia involving the cerebral white matter, incompletely evaluated on this limited, motion degraded examination. Chronic infarcts are again noted in the deep gray nuclei, pons, and cerebellum. There is age advanced cerebral atrophy. No gross intracranial  hemorrhage, midline shift, or extra-axial fluid collection is identified. Vascular: More fully evaluated on earlier CTA. Skull and upper cervical spine: No destructive skull lesion.  Sinuses/Orbits: Grossly unremarkable orbits. Paranasal sinuses and mastoid air cells are clear. Other: None. IMPRESSION: 1. Motion degraded, incomplete examination. 2. Small acute bilateral cerebral infarcts compatible with emboli. 3. Advanced chronic small vessel ischemia. Electronically Signed   By: Logan Bores M.D.   On: 05/09/2020 18:15   CT Code Stroke Cerebral Perfusion with contrast  Result Date: 05/09/2020 CLINICAL DATA:  Unable to speak.  Stroke. EXAM: CT ANGIOGRAPHY HEAD AND NECK CT PERFUSION BRAIN TECHNIQUE: Multidetector CT imaging of the head and neck was performed using the standard protocol during bolus administration of intravenous contrast. Multiplanar CT image reconstructions and MIPs were obtained to evaluate the vascular anatomy. Carotid stenosis measurements (when applicable) are obtained utilizing NASCET criteria, using the distal internal carotid diameter as the denominator. Multiphase CT imaging of the brain was performed following IV bolus contrast injection. Subsequent parametric perfusion maps were calculated using RAPID software. CONTRAST:  170mL OMNIPAQUE IOHEXOL 350 MG/ML SOLN COMPARISON:  CT head 05/09/2020.  CTA head 11/21/2019 FINDINGS: CTA NECK FINDINGS Aortic arch: Standard branching. Imaged portion shows no evidence of aneurysm or dissection. No significant stenosis of the major arch vessel origins. Right carotid system: Right carotid widely patent. Minimal atherosclerotic disease right carotid bulb. Left carotid system: Left carotid widely patent. Minimal atherosclerotic disease left carotid bulb. Vertebral arteries: Right vertebral artery dominant. Moderate stenosis proximal right vertebral artery unchanged. Remainder of the right vertebral artery is patent. Small left vertebral artery ends in  PICA without focal stenosis. Skeleton: Cervical spondylosis.  No acute skeletal abnormality. Other neck: Negative for mass or adenopathy in the neck. Upper chest: Prominent apical blebs and emphysema. No acute abnormality. Review of the MIP images confirms the above findings CTA HEAD FINDINGS Anterior circulation: Mild atherosclerotic calcification in the cavernous carotid bilaterally. 2 mm left paraclinoid aneurysm projecting laterally is unchanged. Anterior and middle cerebral arteries widely patent without significant stenosis or occlusion. Posterior circulation: Right vertebral artery supplies the basilar which is patent. Right PICA patent. Left vertebral artery is small and ends in PICA. Superior cerebellar and posterior cerebral arteries patent bilaterally without stenosis. Fetal origin right posterior cerebral artery. No aneurysm in the posterior circulation. Venous sinuses: Normal venous enhancement Anatomic variants: None Review of the MIP images confirms the above findings CT Brain Perfusion Findings: ASPECTS: 10 CBF (<30%) Volume: 49mL Perfusion (Tmax>6.0s) volume: 76mL Mismatch Volume: 41mL Infarction Location:None IMPRESSION: 1. CT perfusion negative for acute ischemia or infarction 2. Negative for intracranial large vessel occlusion or flow limiting stenosis 3. Both carotid arteries are widely patent in the neck with minimal atherosclerotic disease. 4. Moderate stenosis proximal right vertebral artery which is dominant. Small left vertebral artery ends in PICA 5. 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 Electronically Signed   By: Franchot Gallo M.D.   On: 05/09/2020 12:27   DG Chest Portable 1 View  Result Date: 05/09/2020 CLINICAL DATA:  Unresponsive. EXAM: PORTABLE CHEST 1 VIEW COMPARISON:  November 20, 2019. FINDINGS: The heart size and mediastinal contours are within normal limits. Emphysematous disease is noted in the upper lobes. No consolidative  process is noted. The visualized skeletal structures are unremarkable. IMPRESSION: No active disease. Emphysema (ICD10-J43.9). Electronically Signed   By: Marijo Conception M.D.   On: 05/09/2020 12:45   CT HEAD CODE STROKE WO CONTRAST  Result Date: 05/09/2020 CLINICAL DATA:  Code stroke.  Ataxia.  Unable to speak. EXAM: CT HEAD WITHOUT CONTRAST TECHNIQUE: Contiguous axial images were obtained from  the base of the skull through the vertex without intravenous contrast. COMPARISON:  CT head 11/20/2019 FINDINGS: Brain: Moderate atrophy with interval progression. Negative for hydrocephalus. Chronic microvascular ischemic changes in the white matter. Chronic lacunar infarctions in the internal capsule right greater than left unchanged. Chronic infarcts in the pons bilaterally. Negative for acute infarct, hemorrhage, mass. Vascular: Negative for hyperdense vessel Skull: Negative Sinuses/Orbits: Paranasal sinuses clear.  Negative orbit. Other: None ASPECTS ([place] Stroke Program Early CT Score) - Ganglionic level infarction (caudate, lentiform nuclei, internal capsule, insula, M1-M3 cortex): 7 - Supraganglionic infarction (M4-M6 cortex): 3 Total score (0-10 with 10 being normal): 10 IMPRESSION: 1. No acute abnormality 2. ASPECTS is 10 3. Moderate atrophy and moderate to advanced chronic ischemic changes. Electronically Signed   By: Franchot Gallo M.D.   On: 05/09/2020 12:16    Assessment and Plan:   1.  Elevated troponin: -No ischemic symptoms, right bundle is old and no significant change from 11/20/2019 -Echo in December shows EF 45-50% with grade 1 diastolic dysfunction -Repeat echo pending -Currently on aspirin, Lipitor 10 mg daily, not on beta-blocker -Blood pressure elevated on admission, but is well controlled today. -In the setting of acute CVA, will defer blood pressure management to Neurology as they may want permissive hypertension -In the absence of symptoms, do not feel additional ischemic  evaluation is indicated unless echo is significantly, newly, abnormal  2.  Hypokalemia: -He has gotten IV supplementation -Recheck ordered -Diuretic on hold  3.  Symptomatic anemia: -Cause unclear -S/p 1 unit PRBCs with improvement -MCV is low, B12 is elevated -Fecal occult blood negative x1 -Management per IM  Principal Problem:   Episode of unresponsiveness Active Problems:   Hepatitis C   CVA (cerebral vascular accident) ([place])   Hypokalemia   Hyperammonemia ([facility])   Symptomatic anemia   History of CVA (cerebrovascular accident)   Elevated troponin   For questions or updates, please contact [facility] Please consult [email or web address] for contact info under Cardiology/STEMI.   Signed, Rosaria Ferries, PA-C  05/10/2020 4:19 PM  Patient seen and examined with Rosaria Ferries, PA-C.  Agree as above, with the following exceptions and changes as noted below.  Mr. Alarid was noted to have small punctate multifocal cerebral infarct with embolic pattern with unknown source.  Troponin is mildly elevated and downtrending in the setting of stroke. Gen: NAD, CV: RRR, no murmurs, Lungs: clear, Abd: soft, Extrem: Warm,no edema, Neuro/Psych: alert, normal mood. All available labs, radiology testing, previous records reviewed.  Patient's hemoglobin is 6 and troponin is downtrending from 96>39.  In the setting of stroke this does not likely represent ACS.  Echocardiogram shows normal LV function and no significant valvular heart disease, no evidence of source of stroke on this echocardiogram by report.  Unlikely that the patient requires further ischemic testing in hospital.   Elouise Munroe, MD

## 2020-05-10 NOTE — Progress Notes (Signed)
  Echocardiogram 2D Echocardiogram has been performed.  Darren Pearson 05/10/2020, 10:30 AM

## 2020-05-10 NOTE — TOC Initial Note (Signed)
Transition of Care [facility]) - Initial/Assessment Note    Patient Details  Name: Darren Pearson MRN: [id number] Date of Birth: Oct 02, 1966  Transition of Care [facility] CM/SW Contact:    Bethann Berkshire, [place] Phone Number: 05/10/2020, 4:15 PM  Clinical Narrative:                  CSW met with pt and pt sister [name] at bedside. Pt is not oriented to time or situation. CSW speaks with sister regarding recommendation for SNF. She is agreeable to recommendation and is okay with CSW sending bed requests. No specific preference though does want SNF close to to her home in [place].   Fl2 completed and initial referrals sent.   Expected Discharge Plan: Skilled Nursing Facility Barriers to Discharge: Continued Medical Work up, Inadequate or no insurance   Patient Goals and CMS Choice        Expected Discharge Plan and Services Expected Discharge Plan: [place] arrangements for the past 2 months: Single Family Home                                      Prior Living Arrangements/Services Living arrangements for the past 2 months: Single Family Home Lives with:: Siblings Patient language and need for interpreter reviewed:: Yes        Need for Family Participation in Patient Care: Yes (Comment) Care giver support system in place?: Yes (comment)   Criminal Activity/Legal Involvement Pertinent to Current Situation/Hospitalization: No - Comment as needed  Activities of Daily Living      Permission Sought/Granted                  Emotional Assessment Appearance:: Appears stated age Attitude/Demeanor/Rapport: Other (comment) (COnfused) Affect (typically observed): Quiet   Alcohol / Substance Use: Not Applicable Psych Involvement: No (comment)  Admission diagnosis:  Episode of unresponsiveness [R41.89] Altered mental status, unspecified altered mental status type [R41.82] Anemia, unspecified type [D64.9] Patient Active Problem List    Diagnosis Date Noted  . Episode of unresponsiveness 05/09/2020  . Symptomatic anemia 05/09/2020  . History of CVA (cerebrovascular accident) 05/09/2020  . Elevated troponin 05/09/2020  . CVA (cerebral vascular accident) ([place]) 11/20/2019  . Hypokalemia 11/20/2019  . Accelerated hypertension 11/20/2019  . Hyperammonemia ([place]) 11/20/2019  . Elevated serum creatinine 11/20/2019  . Elevated blood pressure 03/15/2015  . Rash and nonspecific skin eruption 03/15/2015  . Hepatitis C 04/12/2013  . Allergic rhinitis 04/12/2013  . Unspecified asthma(493.90) 04/12/2013   PCP:  [name], NP Pharmacy:   [facility], [address] [place] [address] Phone: [phone] Fax: [phone]     Social Determinants of Health (SDOH) Interventions    Readmission Risk Interventions No flowsheet data found.

## 2020-05-10 NOTE — Progress Notes (Signed)
[facility] Gastroenterology Progress Note  ALADDIN KOLLMANN 54 y.o. [birth year]  CC:  Anemia   Subjective: Patient denies any abdominal pain, nausea, vomiting.    He is oriented to self and location (knows he is at [facility], though he did report being in the ED).  However, he is not oriented to year (reported date is 2020 I did not know the month).  He is unable to report the president.  Per review of flowsheet, no bowel movement or rectal bleeding since admission.  ROS : Review of Systems  Respiratory: Negative for cough and shortness of breath.   Gastrointestinal: Negative for abdominal pain, blood in stool, constipation, diarrhea, heartburn, melena, nausea and vomiting.    Objective: Vital signs in last 24 hours: Vitals:   05/10/20 0256 05/10/20 0722  BP: 114/68 112/79  Pulse:  68  Resp: 14 16  Temp: 98.3 F (36.8 C) 98.2 F (36.8 C)  SpO2: 100% 100%    Physical Exam:  General:  Alert, cooperative, oriented to self and location only, no acute distress  Head:  Normocephalic, without obvious abnormality, atraumatic  Eyes:  Conjunctival pallor, EOMs intact  Lungs:   Breathing comfortably on room air  Heart:  Regular rate and rhythm  Abdomen:   Soft, non-tender, nondistended, normoactive bowel sounds, no guarding or peritoneal signs  Rectal: Soft brown stool in the rectal vault without any hematochezia or melena.  No masses palpated.  No rectal tenderness.  NT present as chaperone  Extremities: Extremities normal, atraumatic, no  edema  Pulses: 2+ and symmetric    Lab Results: Recent Labs    05/09/20 1225 05/10/20 0410  NA 141 141  K 3.3* 3.9  CL 108 108  CO2 20* 24  GLUCOSE 88 88  BUN 23* 22*  CREATININE 2.47* 1.56*  CALCIUM 9.0 8.8*   Recent Labs    05/09/20 1225  AST 23  ALT 14  ALKPHOS 67  BILITOT 0.5  PROT 8.5*  ALBUMIN 3.5   Recent Labs    05/09/20 1225 05/10/20 0410  WBC 8.4 9.7  NEUTROABS 6.9  --   HGB 6.0* 7.0*  HCT 21.6* 23.6*  MCV 77.1*  78.1*  PLT 491* 393   Recent Labs    05/09/20 1225  LABPROT 14.2  INR 1.1   Impression: Anemia: No signs of GI bleeding, FOBT negative, and brown stool noted per my rectal exam today -Hgb 7.0 today, appropriate rise from 6.0 after 1u pRBCs yesterday -BUN 22/Cr 1.56, improved from yesterday BUN 23/ Cr 2.60 -Normotensive with regular rate, and appears stable  History of Hepatitis C -LFTs normal as of today  Acute altered mental status due to CVA -MRI 6/17 revealed Small acute bilateral cerebral infarcts compatible with emboli.  Plan: Anemia with hemoglobin of 6.0 but FOBT negative with no signs of GI bleeding.  Patient appears asymptomatic, as AMS is presumed to be due to CVA.  At this time, there is no contraindication to proceed with antiplatelet therapy if needed.  Continue monitor H&H with transfusion as needed to maintain hemoglobin >7.  No further inpatient GI work-up recommended due to acute stroke.  Recommend outpatient GI work-up.  [facility] will sign off.  Please contact us if we can be of any further assistance during this hospital stay.  Salley Slaughter PA-C 05/10/2020, 10:26 AM  Contact #  [phone]

## 2020-05-10 NOTE — Evaluation (Signed)
Clinical/Bedside Swallow Evaluation Patient Details  Name: Darren Pearson MRN: [id number] Date of Birth: 1966-11-19  Today's Date: 05/10/2020 Time: SLP Start Time (ACUTE ONLY): 1310 SLP Stop Time (ACUTE ONLY): 1318 SLP Time Calculation (min) (ACUTE ONLY): 8 min  Past Medical History:  Past Medical History:  Diagnosis Date  . Allergy   . Anxiety and depression   . Asthma   . Hepatitis C   . History of chicken pox   . Hypertension    Past Surgical History:  Past Surgical History:  Procedure Laterality Date  . BUBBLE STUDY  11/22/2019   Procedure: BUBBLE STUDY;  Surgeon: Dorothy Spark, MD;  Location: [facility];  Service: Cardiovascular;;  . LOOP RECORDER INSERTION N/A 11/22/2019   Procedure: LOOP RECORDER INSERTION;  Surgeon: Thompson Grayer, MD;  Location: [facility] CV LAB;  Service: Cardiovascular;  Laterality: N/A;  . TEE WITHOUT CARDIOVERSION N/A 11/22/2019   Procedure: TRANSESOPHAGEAL ECHOCARDIOGRAM (TEE);  Surgeon: Dorothy Spark, MD;  Location: [facility] ENDOSCOPY;  Service: Cardiovascular;  Laterality: N/A;   HPI:  Darren Pearson is a 54 y.o. male with medical history significant of hypertension, hyperlipidemia, CVA with residual left-sided hemiparesis, hepatitis C, anxiety, and depression who presents after his sister found him slouch against the house outside. Over the last couple of days patient had been refusing to eat certain meals throughout the day despite his sister fixing things that he usually likes.  Over the last 3 to 4 weeks he had not been his normal self, was not taking his medications regularly, was not getting out of bed, or taking showers.  Patient has reportedly been lying in bed continuously and came in with bedbugs.  On physical exam patient had some findings suggestive possibly of scabies with rash in between the webs of his feet. CT negative, but MRI shows Small acute bilateral cerebral infarcts compatible with emboli.   Assessment / Plan /  Recommendation Clinical Impression  Pt alert, but confused with signs of aphasia. Pt needed assist to problem solve self feeding. Additionally he had an audible swallow and consitent coughing after sips of thin liquids with eventual wet expectoration. Trialed nectar thick liquids with elimination of coughing. Recommend pt initiate a regular diet with nectar thick liquids. Will f/u for further trials and SLE, which is not currently ordered.  SLP Visit Diagnosis: Dysphagia, oropharyngeal phase (R13.12)    Aspiration Risk  Moderate aspiration risk    Diet Recommendation Regular;Nectar-thick liquid   Liquid Administration via: Cup;Straw Medication Administration: Whole meds with liquid Supervision: Staff to assist with self feeding Compensations: Slow rate;Small sips/bites Postural Changes: Seated upright at 90 degrees    Other  Recommendations Oral Care Recommendations: Oral care BID   Follow up Recommendations 24 hour supervision/assistance;Skilled Nursing facility      Frequency and Duration min 3x week  2 weeks       Prognosis Prognosis for Safe Diet Advancement: Good      Swallow Study   General HPI: Darren Pearson is a 54 y.o. male with medical history significant of hypertension, hyperlipidemia, CVA with residual left-sided hemiparesis, hepatitis C, anxiety, and depression who presents after his sister found him slouch against the house outside. Over the last couple of days patient had been refusing to eat certain meals throughout the day despite his sister fixing things that he usually likes.  Over the last 3 to 4 weeks he had not been his normal self, was not taking his medications regularly, was not getting out  of bed, or taking showers.  Patient has reportedly been lying in bed continuously and came in with bedbugs.  On physical exam patient had some findings suggestive possibly of scabies with rash in between the webs of his feet. CT negative, but MRI shows Small acute  bilateral cerebral infarcts compatible with emboli. Type of Study: Bedside Swallow Evaluation Previous Swallow Assessment: none Diet Prior to this Study: NPO Temperature Spikes Noted: No Respiratory Status: Room air History of Recent Intubation: No Behavior/Cognition: Alert;Requires cueing Oral Cavity Assessment: Within Functional Limits Oral Care Completed by SLP: No Oral Cavity - Dentition: Adequate natural dentition Vision: Functional for self-feeding Self-Feeding Abilities: Able to feed self;Needs assist Patient Positioning: Upright in bed Baseline Vocal Quality: Normal Volitional Cough: Strong Volitional Swallow: Able to elicit    Oral/Motor/Sensory Function Overall Oral Motor/Sensory Function: Within functional limits   Ice Chips     Thin Liquid Thin Liquid: Impaired Presentation: Straw;Cup Pharyngeal  Phase Impairments: Cough - Immediate    Nectar Thick Nectar Thick Liquid: Within functional limits Presentation: Straw   Honey Thick Honey Thick Liquid: Not tested   Puree Puree: Not tested   Solid     Solid: Within functional limits     Herbie Baltimore, MA CCC-SLP  Acute Rehabilitation Services Pager [phone] Office [phone]  Lynann Beaver 05/10/2020,1:22 PM

## 2020-05-10 NOTE — Procedures (Signed)
Procedure Note: Lumbar puncture  Indications: assess for CNS pathology  Operator: Rosalin Hawking, MD, PhD  Others present:   Indications, risks, and benefits explained to patient / surrogate decision maker and informed consent obtained. Time-out was performed, with all individuals present agreeing on the procedure to be performed, the site of procedure, and the patient identity.  Patient positioned, prepped and draped in usual sterile fashion. L3-4 space located using bilateral iliac crests as landmarks. 1% Lidocaine without epinephrine was used to anesthetize the area. An 20G spinal needle was introduced but not able to reach the sub- arachnoid space. L2-3 space was then located. 1% Lidocaine without epinephrine was used to anesthetize the area. An 20G spinal needle was introduced into the subarachnoid space. Stylet was removed with appropriate fluid return. Needle removed after adequate fluid collected. Blood loss was minimal. A dry guaze dressing was placed over insertion site. Patient tolerated the procedure well and no complications were observed. Spontaneous movement of bilateral extremities were observed after the procedure.  Opening pressure: not measured  Total fluid removed: 10cc  Color of fluid: clear  Sent for: cell count + diff , gram stain, glucose, protein, crypto antigen, VDRL  Rosalin Hawking, MD PhD Stroke Neurology 05/10/2020 7:14 PM

## 2020-05-11 DIAGNOSIS — D509 Iron deficiency anemia, unspecified: Secondary | ICD-10-CM

## 2020-05-11 LAB — BASIC METABOLIC PANEL
Anion gap: 6 (ref 5–15)
BUN: 23 mg/dL — ABNORMAL HIGH (ref 6–20)
CO2: 25 mmol/L (ref 22–32)
Calcium: 8.1 mg/dL — ABNORMAL LOW (ref 8.9–10.3)
Chloride: 108 mmol/L (ref 98–111)
Creatinine, Ser: 1.59 mg/dL — ABNORMAL HIGH (ref 0.61–1.24)
GFR calc Af Amer: 56 mL/min — ABNORMAL LOW (ref >60)
GFR calc non Af Amer: 48 mL/min — ABNORMAL LOW (ref >60)
Glucose, Bld: 88 mg/dL (ref 70–99)
Potassium: 3.7 mmol/L (ref 3.5–5.1)
Sodium: 139 mmol/L (ref 135–145)

## 2020-05-11 LAB — IRON AND TIBC
Iron: 13 ug/dL — ABNORMAL LOW (ref 45–182)
Saturation Ratios: 3 % — ABNORMAL LOW (ref 17.9–39.5)
TIBC: 407 ug/dL (ref 250–450)
UIBC: 394 ug/dL

## 2020-05-11 LAB — CBC
HCT: 21.5 % — ABNORMAL LOW (ref 39.0–52.0)
Hemoglobin: 6.4 g/dL — CL (ref 13.0–17.0)
MCH: 23.3 pg — ABNORMAL LOW (ref 26.0–34.0)
MCHC: 29.8 g/dL — ABNORMAL LOW (ref 30.0–36.0)
MCV: 78.2 fL — ABNORMAL LOW (ref 80.0–100.0)
Platelets: 335 10*3/uL (ref 150–400)
RBC: 2.75 MIL/uL — ABNORMAL LOW (ref 4.22–5.81)
RDW: 16.9 % — ABNORMAL HIGH (ref 11.5–15.5)
WBC: 6.4 10*3/uL (ref 4.0–10.5)
nRBC: 0 % (ref 0.0–0.2)

## 2020-05-11 LAB — HEMOGLOBIN A1C
Hgb A1c MFr Bld: 5.3 % (ref 4.8–5.6)
Mean Plasma Glucose: 105 mg/dL

## 2020-05-11 LAB — HEMOGLOBIN AND HEMATOCRIT, BLOOD
HCT: 28.8 % — ABNORMAL LOW (ref 39.0–52.0)
Hemoglobin: 9.1 g/dL — ABNORMAL LOW (ref 13.0–17.0)

## 2020-05-11 LAB — FERRITIN: Ferritin: 8 ng/mL — ABNORMAL LOW (ref 24–336)

## 2020-05-11 LAB — PREPARE RBC (CROSSMATCH)

## 2020-05-11 MED ORDER — SODIUM CHLORIDE 0.9 % IV SOLN
510.0000 mg | Freq: Once | INTRAVENOUS | Status: AC
  Administered 2020-05-11: 510 mg via INTRAVENOUS
  Filled 2020-05-11: qty 17

## 2020-05-11 MED ORDER — SODIUM CHLORIDE 0.9% IV SOLUTION: Freq: Once | INTRAVENOUS | Status: DC

## 2020-05-11 NOTE — Progress Notes (Signed)
PROGRESS NOTE  DNAIEL VOLLER HAL:[id number] DOB: [birth year] DOA: 05/09/2020 PCP: [name], NP  Brief History   The patient is a 54 yr old man who was found slumped by the side of his house. 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 He also carries history of hypertension, hyperlipidemia, hepatitis C, anxiety, depression. When he was found he was drooling and not responding. His sister mentioned that in the last 2-3 days the patient was changed. He had been refusing to eat, not taking his medications regularly, and not showering or getting out of bed. She reported that he did complain of having black stools. He has been using ibuprofen.   In the ED the patient was found to have hemoglobin of 6.0. He has been transfused with one unit of PRBC's. The patient's aspirin was held, and GI was consulted. FOBT was negative.  The patient has been admitted to a telemetry bed.   Neurology was consulted. A stroke work-up was initiated.   Although CT head demonstrated only chronic infarcts, MRI did demonstrate small acute infarcts in the basal ganglia, left corona radiata, right occipital white matter, left parietal periventricular white matter, and bilateral frontal white matter. It is felt that this appearance is compatibile emboli. There was also advanced chronic small vessel ischemia.   CTA head and neck was also negative for acute ischemia or infarction, and no intracranial large vessel occlusion or flow limiting stenosis. Carotid arteries are patent. There is moderate stenosis of the proximal right vertebral artery. There is a 64mm left paraclinoid internal carotid artery aneurysm that is unchanged from prior CTA.   Echocardiogram has been completed, but has not yet been read.  Neurology has been consulted as well and the patient has been evaluated by Dr. Erlinda Hong. He has recommended an LP as he feels that patient may be suffering from a  cerebral vasculitis resulting in the patient's odd behavior and mental status changes in the last couple of weeks. It was done this morning. Results are pending.  Gastroenterology has evaluated the patient. They have cleared the patient to resume antiplatelet medication. They state that he should have endoscopy as outpatient after he is recovered from current stroke.   The patient has been transfused with a two more units of PRBC's.  Consultants  . Neurology . Gastroenterology . Cardiology  Procedures  . None  Antibiotics   Anti-infectives (From admission, onward)   None     Subjective  The patient is awake and alert, but confused. He gives different answers to his RN and to me about his circumstances prior to admission. No new complaints.   Objective   Vitals:  Vitals:   05/11/20 1202 05/11/20 1220  BP: 127/79 131/77  Pulse: 79 80  Resp: 16 16  Temp: 98.7 F (37.1 C) 98.9 F (37.2 C)  SpO2: 100% 100%   Exam:  Constitutional:  . The patient is awake, alert, and confused. No acute distress. Respiratory:  . No increased work of breathing. . No wheezes, rales, or rhonchi . No tactile fremitus Cardiovascular:  . Regular rate and rhythm . No murmurs, ectopy, or gallups. . No lateral PMI. No thrills. Abdomen:  . Abdomen is soft, non-tender, non-distended . No hernias, masses, or organomegaly . Normoactive bowel sounds.  Musculoskeletal:  . No cyanosis, clubbing, or edema Skin:  . No rashes, lesions, ulcers . palpation of skin: no induration or nodules Neurologic:  . Unable to  evaluate due to echocardiogram in progress. Psychiatric:  . Mental status o Mood, affect appropriate o Orientation to person, and date/time  . The patient remains confused.   I have personally reviewed the following:   Today's Data  . Vitals, BMP, CBC, Lipid panel  Micro Data  . Blood cultures x 2 (05/09/2020): No growth  Imaging  . CTA head and neck with perfusion study . MRI  Brain  Cardiology Data  . Echocardiogram is pending. . EKG  Scheduled Meds: . sodium chloride   Intravenous Once  . atorvastatin  10 mg Oral Daily  . lidocaine (PF)  5 mL Intradermal Once  . permethrin  1 application Topical Once  . sodium chloride flush  3 mL Intravenous Q12H   Continuous Infusions:  Principal Problem:   Episode of unresponsiveness Active Problems:   Hepatitis C   CVA (cerebral vascular accident) ([place])   Hypokalemia   Hyperammonemia ([facility])   Symptomatic anemia   History of CVA (cerebrovascular accident)   Elevated troponin   LOS: 2 days   A & P  CVA: Acute. The patient was admitted to a telemetry bed. Neurology was consulted and stroke work-up was initiated. Although CT head demonstrated only chronic infarcts, MRI did demonstrate small acute infarcts in the basal ganglia, left corona radiata, right occipital white matter, left parietal periventricular white matter, and bilateral frontal white matter. It is felt that this appearance is compatibile with an embolic phenomenon. There was also advanced chronic small vessel ischemia. Echocardiogram, PT/OT eval are pending. TOC has been consulted to find placement. It is assumed that the patient will require SNF placement. Initially ASA was held due to the patient's anemia with Hgb of 6.0 and complaint of melena. GI has now stated that antithrombotics may be restarted.    Symptomatic anemia: Hemoglobin 6 g/dL on admission. The patient has received 1 unit of PRBC's in transfusion. FOBT performed in the ED and then repeated this morning on the floor have been negative for acute blood. Wyn Quaker this is due to acute blood loss in the setting of chronic anemia.The patient apparently had been using NSAIDS at home. GI stated on 05/10/2020 that they feel it is safe to resume the patient's antithrombotics.  Patient reported having blood in stools at home in the 2 weeks prior to presentation. Stool guaiacs however were noted to be negative  both in ED and on the floor this morning.  Patient was typed and screened and ordered to be transfused 1 unit of packed red blood cells due to his presenting hemoglobin of 6.0. . After this transfusion, his hemoglobin again dropped to 6.4. He received 2 more units at that time. Continue to monitor hemoglobin. ASA has now been stopped again.   Elevated troponin: Acute. Troponin mildly elevated at 41.  EKG similar to previous, but with faster rate.  Suspect secondary to demand in setting of anemia.  They have continued to trend up. Will consult Cardiology and continue to follow troponins. Will also order repeat EKG.   Acute kidney injury: Patient presents with creatinine elevated up to 2.47 with BUN 43. Creatinine is down to 1.59 this morning. CK was negative. Continue to monitor creatinine,  previously noted to be relatively within normal limits at 1.19 in 2020.    Hypokalemia: Resolved. Potassium 3.3 on admission. 3.7 this morning. Monitor and replace as necessary.   Bedbugs: Patient has reportedly been lying in bed continuously and came in with bedbugs.  On physical exam patient had some  findings suggestive possibly of scabies with rash in between the webs of his feet. Contact precautions are in place and the patient is being treated with permethrin cream.   History of stroke with residual weakness: Patient with previous stroke in December 2020 with acute/subacute bilateral cortical infarction of left postcentral gyrus/corona radiata in addition to punctate cortical infarct of the right parietal lobe; also noted to have remote infarcts and evidence of likely chronic microvascular ischemia.  Patient had negative hypercoagulable work-up at that time. Loop recorder apparently placed. I have contacted cardiology to have the device interrogated.   Systolic congestive heart failure: Patient appears to be hypovolemic at this time. Chest x-ray otherwise noted to be clear.  Last EF noted to be 45 to 50% by  echocardiogram in 10/2019. Repeat echocardiogram has demonstrated abnormal septal motion with EF of 50-55% with low normal function and no regional wall motion abnormalities. Diastolic parameters were normal. RV systolic function and size are normal.   Essential hypertension: Home blood pressure medications include amlodipine 10 mg daily and lisinopril-hydrochlorothiazide 20-25 mg daily. Will pursue a permissive approach to BP control for the first 48 hours post stroke. Monitor. Restart home blood pressure medications when medically appropriate.   Elevated ammonia/History of hepatitis C: Ammonia level was just mildly elevated at 36, but liver enzymes noted to be typically within normal limits. Clinically, no signs of HE this morning.   Prolonged QT interval: QTc initially noted to be elevated at 511. Will recheck EKG. Electrolyte abnormality has been corrected.  I have seen and examined this patient myself. I have spent 34 minutes in his evaluation and care.   DVT prophylaxis: scds   Code Status: Full Family Communication: None available Disposition Plan: To be determined  Gerene Nedd, DO [facility] Direct contact: see [email or web address]  7PM-7AM contact night coverage as above 05/11/2020, 2:53 PM  LOS: 1 day

## 2020-05-11 NOTE — Progress Notes (Addendum)
  Speech Language Pathology Treatment: Dysphagia  Patient Details Name: Darren Pearson MRN: [id number] DOB: [birth year] Today's Date: 05/11/2020 Time: 1010-1040 SLP Time Calculation (min) (ACUTE ONLY): 30 min  Assessment / Plan / Recommendation Clinical Impression  Pt tolerating Po intake per staff, self feeding. Yesterday trials of thin liquids led to immediate and delayed coughing. Today, 3 oz water swallow observed with thin and nectar without coughing or throat clearing. Pt does have a subjective appearance of very slight delay in swallow (audible swallow) particularly with thin liquids. Pt may need instrumental assessment if signs of aspiration persist, but will upgrade to thin liquids for tolerance while admitting. Proceed to MBS with any concerns. Will f/u.    HPI HPI: Darren Pearson is a 54 y.o. male with medical history significant of hypertension, hyperlipidemia, CVA with residual left-sided hemiparesis, hepatitis C, anxiety, and depression who presents after his sister found him slouch against the house outside. Over the last couple of days patient had been refusing to eat certain meals throughout the day despite his sister fixing things that he usually likes.  Over the last 3 to 4 weeks he had not been his normal self, was not taking his medications regularly, was not getting out of bed, or taking showers.  Patient has reportedly been lying in bed continuously and came in with bedbugs.  On physical exam patient had some findings suggestive possibly of scabies with rash in between the webs of his feet. CT negative, but MRI shows Small acute bilateral cerebral infarcts compatible with emboli.      SLP Plan  Continue with current plan of care       Recommendations  Diet recommendations: Regular;Thin liquid Liquids provided via: Cup;Straw Medication Administration: Whole meds with liquid Supervision: Patient able to self feed Compensations: Slow rate;Small sips/bites Postural Changes  and/or Swallow Maneuvers: Seated upright 90 degrees                Follow up Recommendations: 24 hour supervision/assistance;Skilled Nursing facility SLP Visit Diagnosis: Dysphagia, oropharyngeal phase (R13.12) Plan: Continue with current plan of care       GO               Herbie Baltimore, MA [place] Pager [phone] Office [phone]  Lynann Beaver 05/11/2020, 12:01 PM

## 2020-05-11 NOTE — TOC Progression Note (Signed)
Transition of Care [facility]) - Progression Note    Patient Details  Name: Darren Pearson MRN: [id number] Date of Birth: 1966/01/13  Transition of Care [facility]) CM/SW [place], [place] Phone Number: 05/11/2020, 3:32 PM  Clinical Narrative:     CSW expanded bedsearch to [place] and [employer].   Expected Discharge Plan: [place] Barriers to Discharge: Continued Medical Work up, Inadequate or no insurance  Expected Discharge Plan and Services Expected Discharge Plan: [place] arrangements for the past 2 months: Single Family Home                                       Social Determinants of Health (SDOH) Interventions    Readmission Risk Interventions No flowsheet data found.

## 2020-05-11 NOTE — Evaluation (Signed)
Speech Language Pathology Evaluation Patient Details Name: Darren Pearson MRN: [id number] DOB: [birth year] Today's Date: 05/11/2020 Time: 1010-1040 SLP Time Calculation (min) (ACUTE ONLY): 30 min  Problem List:  Patient Active Problem List   Diagnosis Date Noted  . Episode of unresponsiveness 05/09/2020  . Symptomatic anemia 05/09/2020  . History of CVA (cerebrovascular accident) 05/09/2020  . Elevated troponin 05/09/2020  . CVA (cerebral vascular accident) ([place]) 11/20/2019  . Hypokalemia 11/20/2019  . Accelerated hypertension 11/20/2019  . Hyperammonemia ([place]) 11/20/2019  . Elevated serum creatinine 11/20/2019  . Elevated blood pressure 03/15/2015  . Rash and nonspecific skin eruption 03/15/2015  . Hepatitis C 04/12/2013  . Allergic rhinitis 04/12/2013  . Unspecified asthma(493.90) 04/12/2013   Past Medical History:  Past Medical History:  Diagnosis Date  . Allergy   . Anxiety and depression   . Asthma   . Hepatitis C   . History of chicken pox   . Hypertension    Past Surgical History:  Past Surgical History:  Procedure Laterality Date  . BUBBLE STUDY  11/22/2019   Procedure: BUBBLE STUDY;  Surgeon: Dorothy Spark, MD;  Location: [place];  Service: Cardiovascular;;  . LOOP RECORDER INSERTION N/A 11/22/2019   Procedure: LOOP RECORDER INSERTION;  Surgeon: Thompson Grayer, MD;  Location: [place] CV LAB;  Service: Cardiovascular;  Laterality: N/A;  . TEE WITHOUT CARDIOVERSION N/A 11/22/2019   Procedure: TRANSESOPHAGEAL ECHOCARDIOGRAM (TEE);  Surgeon: Dorothy Spark, MD;  Location: [facility] ENDOSCOPY;  Service: Cardiovascular;  Laterality: N/A;   HPI:  RAHEEN CAPILI is a 54 y.o. male with medical history significant of hypertension, hyperlipidemia, CVA with residual left-sided hemiparesis, hepatitis C, anxiety, and depression who presents after his sister found him slouch against the house outside. Over the last couple of days patient had been refusing to eat  certain meals throughout the day despite his sister fixing things that he usually likes.  Over the last 3 to 4 weeks he had not been his normal self, was not taking his medications regularly, was not getting out of bed, or taking showers.  Patient has reportedly been lying in bed continuously and came in with bedbugs.  On physical exam patient had some findings suggestive possibly of scabies with rash in between the webs of his feet. CT negative, but MRI shows Small acute bilateral cerebral infarcts compatible with emboli.   Assessment / Plan / Recommendation Clinical Impression  Pt demonstrates both cognitive and language impairment. Pt has poor attention, needs direct and frequent cues to focus attention to functional and verbal tasks and is easly distracted. He often has delayed responses or even lack of any response which is likely indicative of lost attention or impaired comprehension of task with inability to initiate problem solving. When attending, pt is able to follow most basic commands, suspect that most impairment in comprehension is attention based. However, with expressive language, pt has islands of immediate response to questions which are characterized by phonemic paraphasias, jargon and perseveration indicating at least a moderate aphasia with expressive impairment. Pt can repeat words, but not phrases and picture description severely impaired. 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 Will f/u acutely, recommend SNF level rehab.     SLP Assessment  SLP Recommendation/Assessment: Patient needs continued Speech Lanaguage Pathology Services SLP Visit Diagnosis: Dysphagia, oropharyngeal phase (R13.12)    Follow Up Recommendations  Skilled Nursing facility    Frequency and Duration min 2x/week  2 weeks      SLP Evaluation Cognition  Overall Cognitive Status: Impaired/Different from baseline Arousal/Alertness:  Awake/alert Orientation Level: Oriented to person;Disoriented to place;Disoriented to time;Disoriented to situation Attention: Focused Focused Attention: Impaired Focused Attention Impairment: Verbal basic;Functional basic Memory:  (UTA) Awareness: Impaired Awareness Impairment: Intellectual impairment;Emergent impairment;Anticipatory impairment Problem Solving: Impaired Problem Solving Impairment: Verbal basic;Functional basic Executive Function: Initiating Initiating: Impaired Initiating Impairment: Verbal basic;Functional basic Safety/Judgment: Impaired       Comprehension  Auditory Comprehension Overall Auditory Comprehension: Impaired Yes/No Questions: Impaired Basic Biographical Questions: 76-100% accurate Complex Questions: 25-49% accurate Commands: Impaired One Step Basic Commands: 50-74% accurate Two Step Basic Commands: 50-74% accurate Complex Commands: 0-24% accurate Conversation: Simple Interfering Components: Attention;Processing speed;Working Camera operator Expression Overall Verbal Expression: Impaired Initiation: Impaired Automatic Speech: Name;Social Response Repetition: Impaired Level of Impairment: Phrase level Naming: Impairment Responsive: Not tested Confrontation: Impaired Convergent: Not tested Divergent: Not tested Verbal Errors: Phonemic paraphasias;Perseveration;Jargon;Not aware of errors Pragmatics: Impairment Impairments: Monotone;Abnormal affect Interfering Components: Attention Written Expression Dominant Hand: Left   Oral / Motor  Oral Motor/Sensory Function Overall Oral Motor/Sensory Function: Within functional limits Motor Speech Overall Motor Speech: Impaired Respiration: Within functional limits Phonation: Normal Resonance: Within functional limits Articulation: Impaired Level of Impairment: Word Intelligibility: Intelligible Motor Planning: Witnin functional limits Motor Speech Errors: Consistent   GO                     Andrya Roppolo, Katherene Ponto 05/11/2020, 12:23 PM

## 2020-05-11 NOTE — Progress Notes (Addendum)
STROKE TEAM PROGRESS NOTE   INTERVAL HISTORY RN at bedside. Pt lying in bed, smiling to provider, more interactive than yesterday. His Hb down to 6.4 and he received 2U PRBC and he felt much better. His CSF was clean.    Vitals:   05/10/20 0256 05/10/20 0722 05/10/20 1611 05/11/20 0030  BP: 114/68 112/79 134/82 129/85  Pulse:  68 91 84  Resp: 14 16  17   Temp: 98.3 F (36.8 C) 98.2 F (36.8 C) 98.4 F (36.9 C) 98.2 F (36.8 C)  TempSrc: Oral Oral Oral Oral  SpO2: 100% 100% 100% 100%    CBC:  Recent Labs  Lab 05/09/20 1225 05/09/20 1225 05/10/20 0410 05/10/20 0410 05/10/20 1744 05/11/20 0231  WBC 8.4   < > 9.7  --   --  6.4  NEUTROABS 6.9  --   --   --   --   --   HGB 6.0*   < > 7.0*   < > 6.7* 6.4*  HCT 21.6*   < > 23.6*   < > 22.6* 21.5*  MCV 77.1*   < > 78.1*  --   --  78.2*  PLT 491*   < > 393  --   --  335   < > = values in this interval not displayed.    Basic Metabolic Panel:  Recent Labs  Lab 05/10/20 0410 05/11/20 0231  NA 141 139  K 3.9 3.7  CL 108 108  CO2 24 25  GLUCOSE 88 88  BUN 22* 23*  CREATININE 1.56* 1.59*  CALCIUM 8.8* 8.1*   Lipid Panel:     Component Value Date/Time   CHOL 114 05/10/2020 0410   TRIG 56 05/10/2020 0410   HDL 51 05/10/2020 0410   CHOLHDL 2.2 05/10/2020 0410   VLDL 11 05/10/2020 0410   LDLCALC 52 05/10/2020 0410   HgbA1c:  Lab Results  Component Value Date   HGBA1C 4.2 (L) 11/22/2019   Urine Drug Screen:     Component Value Date/Time   LABOPIA NONE DETECTED 05/09/2020 1430   COCAINSCRNUR NONE DETECTED 05/09/2020 1430   LABBENZ NONE DETECTED 05/09/2020 1430   AMPHETMU NONE DETECTED 05/09/2020 1430   THCU NONE DETECTED 05/09/2020 1430   LABBARB NONE DETECTED 05/09/2020 1430    Alcohol Level     Component Value Date/Time   ETH <10 05/09/2020 1220    IMAGING past 24 hours ECHOCARDIOGRAM COMPLETE  Result Date: 05/10/2020    ECHOCARDIOGRAM REPORT   Patient Name:   Darren Pearson Date of Exam: 05/10/2020  Medical Rec #:  [id number]       Height:       69.0 in Accession #:    [id number]      Weight:       155.8 lb Date of Birth:  08-31-1966        BSA:          1.858 m Patient Age:    54 years        BP:           112/79 mmHg Patient Gender: M               HR:           84 bpm. Exam Location:  Inpatient Procedure: 2D Echo, Cardiac Doppler and Color Doppler Indications:    Stroke 434.91 / I163.9  History:        Patient has no prior history of Echocardiogram examinations.  Risk Factors:Hypertension.  Sonographer:    Tiffany Dance Referring Phys: [id number] RONDELL A SMITH IMPRESSIONS  1. Abnormal septal motion . Left ventricular ejection fraction, by estimation, is 50 to 55%. The left ventricle has low normal function. The left ventricle has no regional wall motion abnormalities. Left ventricular diastolic parameters were normal.  2. Right ventricular systolic function is normal. The right ventricular size is normal.  3. The mitral valve is normal in structure. Trivial mitral valve regurgitation. No evidence of mitral stenosis.  4. The aortic valve is normal in structure. Aortic valve regurgitation is not visualized. No aortic stenosis is present.  5. The inferior vena cava is normal in size with greater than 50% respiratory variability, suggesting right atrial pressure of 3 mmHg. FINDINGS  Left Ventricle: Abnormal septal motion. Left ventricular ejection fraction, by estimation, is 50 to 55%. The left ventricle has low normal function. The left ventricle has no regional wall motion abnormalities. The left ventricular internal cavity size was normal in size. There is no left ventricular hypertrophy. Left ventricular diastolic parameters were normal. Right Ventricle: The right ventricular size is normal. No increase in right ventricular wall thickness. Right ventricular systolic function is normal. Left Atrium: Left atrial size was normal in size. Right Atrium: Right atrial size was normal in size.  Pericardium: There is no evidence of pericardial effusion. Mitral Valve: The mitral valve is normal in structure. Normal mobility of the mitral valve leaflets. Trivial mitral valve regurgitation. No evidence of mitral valve stenosis. Tricuspid Valve: The tricuspid valve is normal in structure. Tricuspid valve regurgitation is trivial. No evidence of tricuspid stenosis. Aortic Valve: The aortic valve is normal in structure. Aortic valve regurgitation is not visualized. No aortic stenosis is present. Pulmonic Valve: The pulmonic valve was normal in structure. Pulmonic valve regurgitation is not visualized. No evidence of pulmonic stenosis. Aorta: The aortic root is normal in size and structure. Venous: The inferior vena cava is normal in size with greater than 50% respiratory variability, suggesting right atrial pressure of 3 mmHg. IAS/Shunts: The interatrial septum appears to be lipomatous. No atrial level shunt detected by color flow Doppler.  LEFT VENTRICLE PLAX 2D LVIDd:         4.20 cm  Diastology LVIDs:         3.50 cm  LV e' lateral:   12.50 cm/s LV PW:         1.00 cm  LV E/e' lateral: 7.0 LV IVS:        1.00 cm  LV e' medial:    8.38 cm/s LVOT diam:     2.30 cm  LV E/e' medial:  10.5 LV SV:         84 LV SV Index:   45 LVOT Area:     4.15 cm  RIGHT VENTRICLE             IVC RV Basal diam:  2.50 cm     IVC diam: 1.40 cm RV S prime:     11.90 cm/s TAPSE (M-mode): 1.7 cm LEFT ATRIUM             Index       RIGHT ATRIUM           Index LA diam:        3.10 cm 1.67 cm/m  RA Area:     18.70 cm LA Vol (A2C):   30.6 ml 16.47 ml/m RA Volume:   55.90 ml  30.09 ml/m LA Vol (A4C):  25.4 ml 13.67 ml/m LA Biplane Vol: 28.4 ml 15.29 ml/m  AORTIC VALVE LVOT Vmax:   99.20 cm/s LVOT Vmean:  65.100 cm/s LVOT VTI:    0.201 m  AORTA Ao Root diam: 3.70 cm MITRAL VALVE MV Area (PHT): 3.08 cm    SHUNTS MV Decel Time: 246 msec    Systemic VTI:  0.20 m MV E velocity: 87.80 cm/s  Systemic Diam: 2.30 cm MV A velocity: 76.30  cm/s MV E/A ratio:  1.15 Jenkins Rouge MD Electronically signed by Jenkins Rouge MD Signature Date/Time: 05/10/2020/5:10:24 PM    Final     PHYSICAL EXAM  Temp:  [98.2 F (36.8 C)-98.4 F (36.9 C)] 98.2 F (36.8 C) (06/18 0030) Pulse Rate:  [68-91] 84 (06/18 0030) Resp:  [16-17] 17 (06/18 0030) BP: (112-134)/(79-85) 129/85 (06/18 0030) SpO2:  [100 %] 100 % (06/18 0030)  General - Well nourished, well developed, in no apparent distress.  Ophthalmologic - fundi not visualized due to noncooperation.  Cardiovascular - Regular rhythm and rate.  Mental Status -  Level of arousal and orientation to place and person were intact. However, not orientated to time or age. Language exam showed more interactive today, able to speak simple short sentences, naming 3/4, able to repeat, follows most simple commands  Cranial Nerves II - XII - II - Visual field intact OU. III, IV, VI - Extraocular movements intact. V - Facial sensation intact bilaterally. VII - Facial movement intact bilaterally. VIII - Hearing & vestibular intact bilaterally. X - Palate elevates symmetrically. XI - Chin turning & shoulder shrug intact bilaterally. XII - Tongue protrusion intact.  Motor Strength - The patient's strength was symmetrical in all extremities and pronator drift was absent. Left hand multiple finger amputation. Bulk was normal and fasciculations were absent.   Motor Tone - Muscle tone was assessed at the neck and appendages and was normal.  Reflexes - The patient's reflexes were symmetrical in all extremities and he had no pathological reflexes.  Sensory - Light touch, temperature/pinprick were assessed and were symmetrical.    Coordination - The patient had normal movements in the hands with no ataxia or dysmetria. Tremor was absent.  Gait and Station - deferred.   ASSESSMENT/PLAN [name] is a 54 y.o. male with history of anxiety and depression, hepatitis C, hypertension, stroke in  2020 with residual left-sided hemiparesis requiring a cane to walk presenting with aphasia, not responding to questions.   Stroke: Small/punctate B multifocal cerebral infarct, embolic pattern, likely due to severe anemia in the setting of advanced small vessel ischemia.  Code Stroke CT head No acute abnormality. Moderate Small vessel disease. Moderate Atrophy. ASPECTS 10.     CTA head & neck moderate proximal R VA stenosis. Stable 2 mm L para clinoid ICA aneurysm   CT perfusion no core or penumbra  MRI  Small punctate B cerebral infarcts. Advanced small vessel disease.   2D Echo EF 50-55%  LDL 52  HgbA1c 5.3   UDS neg  Loop recorder interrogation pending  CSF WBC 0, RBC 2, protein 36, glu 69 - no evidence of CNS vasculitis  SCDs for VTE prophylaxis  aspirin 81 mg daily prior to admission, aspirin 81 mg was currently on hold due to severe anemia. OK to resume when feel safe.  Therapy recommendations:  SNF   Disposition:  pending   Hx stroke/TIA  10/2019 - multifocal embolic punctate infarcts at left CR, L mesial temporal lobe, right parietal cortical, and right  pontine infarcts - embolic pattern - secondary to unknown source. CTA head and neck unremarkable, DVT neg, EF 45-50%, TEE neg, no PFO, HIV and RPR neg, LDL 74 and A1C 4.2. ANA neg, hypercoag neg, B12 170, put on DAPT x 3 weeks and Lipitor. Loop placed.   During 01/2020 f/u visit with Dr. Leonie Man, pt on Plavix per note.   Cognitive impairment  Sister stated memory difficulty, confusion for the last one year, especially worse after stroke 6 months ago  Recent behavoir changes  Likely vascular dementia exacerbated by severe anemia   Severe anemia  Symptomatic anemia  Hgb 6.0->7.0->6.4  FOBT neg. guiac neg.   transfused 1 unit + 2 Units.   Iron 13 and ferritin 8  GI will consider colonoscopy as outpt  Consider iron pills  AKI  AKI Cre 2.47->1.56->1.59  Not on IVF  BMP  monitoring  Hypertension  Stable . Long-term BP goal normotensive  Hyperlipidemia  Home meds:  lipitor 40 (pt not taking)  LDL 52, at goal < 70  lipitor 10 added - no high intensity statin due to low level LDL at baseline  Continue statin at discharge  Other Stroke Risk Factors  Former Cigarette smoker - listed as current smoker in 10/2019 - smoking cessation provided.   Hx ETOH use, alcohol level <10, advised to drink no more than 2 drink(s) a day  Hx Substance abuse - THC, Cocaine   Other Active Problems  B12 deficiency, on supplements  Bed bugs, likely scabies  Hx hepatitis C, elevated NH4 = 39. LFTs normal.   Elevated troponin d/t demand of anemia  Hypokalemia   Prolonged QT  Hospital day # 2  Neurology will sign off. Please call with questions. Pt will follow up with stroke clinic NP at [facility] on 05/14/20. Thanks for the consult.  Rosalin Hawking, MD PhD Stroke Neurology 05/11/2020 5:41 PM    To contact Stroke Continuity provider, please refer to [email or web address] After hours, contact General Neurology

## 2020-05-11 NOTE — Progress Notes (Signed)
RE: Darren Pearson. Henrene Pastor    Date of Birth: August 11, 1966  Date: 05/11/2020  To Whom It May Concern:  Please be advised that the above-named patient will require a short-term nursing home stay - anticipated 30 days or less for rehabilitation and strengthening. The plan is for return home.

## 2020-05-12 LAB — TYPE AND SCREEN
ABO/RH(D): A POS
Antibody Screen: POSITIVE
DAT, IgG: POSITIVE
Unit division: 0
Unit division: 0
Unit division: 0

## 2020-05-12 LAB — BASIC METABOLIC PANEL
Anion gap: 8 (ref 5–15)
BUN: 17 mg/dL (ref 6–20)
CO2: 23 mmol/L (ref 22–32)
Calcium: 8.6 mg/dL — ABNORMAL LOW (ref 8.9–10.3)
Chloride: 108 mmol/L (ref 98–111)
Creatinine, Ser: 1.28 mg/dL — ABNORMAL HIGH (ref 0.61–1.24)
GFR calc Af Amer: >60 mL/min (ref >60)
GFR calc non Af Amer: >60 mL/min (ref >60)
Glucose, Bld: 87 mg/dL (ref 70–99)
Potassium: 3.8 mmol/L (ref 3.5–5.1)
Sodium: 139 mmol/L (ref 135–145)

## 2020-05-12 LAB — CBC WITH DIFFERENTIAL/PLATELET
Abs Immature Granulocytes: 0.02 10*3/uL (ref 0.00–0.07)
Basophils Absolute: 0 10*3/uL (ref 0.0–0.1)
Basophils Relative: 0 %
Eosinophils Absolute: 0.3 10*3/uL (ref 0.0–0.5)
Eosinophils Relative: 4 %
HCT: 30 % — ABNORMAL LOW (ref 39.0–52.0)
Hemoglobin: 9.5 g/dL — ABNORMAL LOW (ref 13.0–17.0)
Immature Granulocytes: 0 %
Lymphocytes Relative: 20 %
Lymphs Abs: 1.5 10*3/uL (ref 0.7–4.0)
MCH: 24.9 pg — ABNORMAL LOW (ref 26.0–34.0)
MCHC: 31.7 g/dL (ref 30.0–36.0)
MCV: 78.7 fL — ABNORMAL LOW (ref 80.0–100.0)
Monocytes Absolute: 0.5 10*3/uL (ref 0.1–1.0)
Monocytes Relative: 7 %
Neutro Abs: 5.4 10*3/uL (ref 1.7–7.7)
Neutrophils Relative %: 69 %
Platelets: 303 10*3/uL (ref 150–400)
RBC: 3.81 MIL/uL — ABNORMAL LOW (ref 4.22–5.81)
RDW: 16.1 % — ABNORMAL HIGH (ref 11.5–15.5)
WBC: 7.8 10*3/uL (ref 4.0–10.5)
nRBC: 0 % (ref 0.0–0.2)

## 2020-05-12 LAB — BPAM RBC
Blood Product Expiration Date: 202107072359
Blood Product Expiration Date: 202107072359
Blood Product Expiration Date: 202107072359
ISSUE DATE / TIME: 202106162317
ISSUE DATE / TIME: 202106180516
ISSUE DATE / TIME: 202106181152
Unit Type and Rh: 6200
Unit Type and Rh: 6200
Unit Type and Rh: 6200

## 2020-05-12 NOTE — Progress Notes (Signed)
  Speech Language Pathology Treatment: Dysphagia;Cognitive-Linquistic  Patient Details Name: Darren Pearson MRN: [id number] DOB: May 19, 1966 Today's Date: 05/12/2020 Time: 6283-6629 SLP Time Calculation (min) (ACUTE ONLY): 18 min  Assessment / Plan / Recommendation Clinical Impression  Pt seen for f/u after diet upgrade yesterday. He is more alert and immediately responsive, but aphasia and need for heavy cueing to sustain attention persists. Pt has been consuming meals independently but RN reports he has not observed any coughing with thin liquids. Throughout the course of cog/lang interventions pt sipped on water. No immediate cough occurred, but twice during session, several minutes after drinking pt had a productive cough. Silent aspiration still a concern. Pt is stable however and progressing. Will maintain diet, though he may potentially need instrumental testing (not available today) if concern persists. Pt able to name items on tray with 2-3 phonemic cues per item. Able to count to 20 and name days of the week. Could not participate in a category naming task due to drifting attention. Pt intermittently initiated conversation speech, but fluency was interrupted by paraphasias and word finding errors which cause pt to lose attention and then stay silent despite cues. Pt has no awareness of deficits, laughs when told he is having trouble communicating. Will continue efforts.    HPI HPI: Darren Pearson is a 54 y.o. male with medical history significant of hypertension, hyperlipidemia, CVA with residual left-sided hemiparesis, hepatitis C, anxiety, and depression who presents after his sister found him slouch against the house outside. Over the last couple of days patient had been refusing to eat certain meals throughout the day despite his sister fixing things that he usually likes.  Over the last 3 to 4 weeks he had not been his normal self, was not taking his medications regularly, was not getting  out of bed, or taking showers.  Patient has reportedly been lying in bed continuously and came in with bedbugs.  On physical exam patient had some findings suggestive possibly of scabies with rash in between the webs of his feet. CT negative, but MRI shows Small acute bilateral cerebral infarcts compatible with emboli.      SLP Plan  Continue with current plan of care       Recommendations  Diet recommendations: Regular;Thin liquid Liquids provided via: Cup;Straw Medication Administration: Whole meds with liquid Supervision: Patient able to self feed Compensations: Slow rate;Small sips/bites Postural Changes and/or Swallow Maneuvers: Seated upright 90 degrees                Oral Care Recommendations: Oral care BID Follow up Recommendations: Skilled Nursing facility SLP Visit Diagnosis: Dysphagia, oropharyngeal phase (R13.12) Plan: Continue with current plan of care       GO               Herbie Baltimore, MA [place] Pager [phone] Office [phone]  Lynann Beaver 05/12/2020, 11:07 AM

## 2020-05-12 NOTE — Progress Notes (Signed)
PROGRESS NOTE  Darren Pearson GEX:[id number] DOB: [birth year] DOA: 05/09/2020 PCP: [name], NP  Brief History   The patient is a 54 yr old man who was found slumped by the side of his house. 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 He also carries history of hypertension, hyperlipidemia, hepatitis C, anxiety, depression. When he was found he was drooling and not responding. His sister mentioned that in the last 2-3 days the patient was changed. He had been refusing to eat, not taking his medications regularly, and not showering or getting out of bed. She reported that he did complain of having black stools. He has been using ibuprofen.   In the ED the patient was found to have hemoglobin of 6.0. He has been transfused with one unit of PRBC's. The patient's aspirin was held, and GI was consulted. FOBT was negative.  The patient has been admitted to a telemetry bed.   Neurology was consulted. A stroke work-up was initiated.   Although CT head demonstrated only chronic infarcts, MRI did demonstrate small acute infarcts in the basal ganglia, left corona radiata, right occipital white matter, left parietal periventricular white matter, and bilateral frontal white matter. It is felt that this appearance is compatibile emboli. There was also advanced chronic small vessel ischemia.   CTA head and neck was also negative for acute ischemia or infarction, and no intracranial large vessel occlusion or flow limiting stenosis. Carotid arteries are patent. There is moderate stenosis of the proximal right vertebral artery. There is a 50mm left paraclinoid internal carotid artery aneurysm that is unchanged from prior CTA.   Echocardiogram has been completed, but has not yet been read.  Neurology has been consulted as well and the patient has been evaluated by Dr. Erlinda Hong. He has recommended an LP as he feels that patient may be suffering from a  cerebral vasculitis resulting in the patient's odd behavior and mental status changes in the last couple of weeks. It was done this morning. Results are pending.  Gastroenterology has evaluated the patient. They have cleared the patient to resume antiplatelet medication. They state that he should have endoscopy as outpatient after he is recovered from current stroke.   The patient has been transfused with a two more units of PRBC's.  Consultants  . Neurology . Gastroenterology . Cardiology  Procedures  . None  Antibiotics   Anti-infectives (From admission, onward)   None     Subjective  The patient is awake and alert, but confused. He gives different answers to his RN and to me about his circumstances prior to admission. No new complaints.   Objective   Vitals:  Vitals:   05/11/20 2236 05/12/20 0745  BP: 129/80 139/87  Pulse: 79 82  Resp: 17 19  Temp: 98.7 F (37.1 C) 98.3 F (36.8 C)  SpO2: 100% 99%   Exam:  Constitutional:  . The patient is awake, alert, and oriented x 2. No acute distress. Respiratory:  . No increased work of breathing. . No wheezes, rales, or rhonchi . No tactile fremitus Cardiovascular:  . Regular rate and rhythm . No murmurs, ectopy, or gallups. . No lateral PMI. No thrills. Abdomen:  . Abdomen is soft, non-tender, non-distended . No hernias, masses, or organomegaly . Normoactive bowel sounds.  Musculoskeletal:  . No cyanosis, clubbing, or edema Skin:  . No rashes, lesions, ulcers . palpation of skin: no induration or nodules Neurologic:  .  Unable to evaluate due to echocardiogram in progress. Psychiatric:  . Mental status o Mood, affect appropriate o Orientation to person, and date/time  . The patient remains confused.   I have personally reviewed the following:   Today's Data  . Vitals, BMP, CBC  Micro Data  . Blood cultures x 2 (05/09/2020): No growth  Imaging  . CTA head and neck with perfusion study . MRI Brain   Cardiology Data  . Echocardiogram is pending. . EKG  Scheduled Meds: . sodium chloride   Intravenous Once  . atorvastatin  10 mg Oral Daily  . lidocaine (PF)  5 mL Intradermal Once  . permethrin  1 application Topical Once  . sodium chloride flush  3 mL Intravenous Q12H   Continuous Infusions:  Principal Problem:   Episode of unresponsiveness Active Problems:   Hepatitis C   CVA (cerebral vascular accident) ([place])   Hypokalemia   Hyperammonemia ([facility])   Symptomatic anemia   History of CVA (cerebrovascular accident)   Elevated troponin   LOS: 3 days   A & P  CVA: Acute. The patient was admitted to a telemetry bed. Neurology was consulted and stroke work-up was initiated. Although CT head demonstrated only chronic infarcts, MRI did demonstrate small acute infarcts in the basal ganglia, left corona radiata, right occipital white matter, left parietal periventricular white matter, and bilateral frontal white matter. It is felt that this appearance is compatibile with an embolic phenomenon. There was also advanced chronic small vessel ischemia. Echocardiogram, PT/OT eval are pending. TOC has been consulted to find placement. It is assumed that the patient will require SNF placement. Initially ASA was held due to the patient's anemia with Hgb of 6.0 and complaint of melena. GI has now stated that antithrombotics may be restarted, but I have continued to hold them due to the patient's dropping hemoglobin. Neurology feels that this CVA was due to low hemoglobin. Lumbar puncture was negative for signs of    Symptomatic anemia: Hemoglobin 6 g/dL on admission. The patient has received 1 unit of PRBC's in transfusion. FOBT performed in the ED and then repeated this morning on the floor have been negative for acute blood. Wyn Quaker this is due to acute blood loss in the setting of chronic anemia.The patient apparently had been using NSAIDS at home. GI stated on 05/10/2020 that they feel it is safe to  resume the patient's antithrombotics.  Patient reported having blood in stools at home in the 2 weeks prior to presentation. Stool guaiacs however were noted to be negative both in ED and on the floor this morning.  Patient was typed and screened and ordered to be transfused 1 unit of packed red blood cells due to his presenting hemoglobin of 6.0. . After this transfusion, his hemoglobin again dropped to 6.4. He received 2 more units at that time. Continue to monitor hemoglobin. ASA has now been stopped again. Hemoglobin now appears stable at 9.5.   Elevated troponin: Acute. Troponin mildly elevated at 41.  EKG similar to previous, but with faster rate.  Suspect secondary to demand in setting of anemia.  They have continued to trend up. Will consult Cardiology and continue to follow troponins. Will also order repeat EKG.   Acute kidney injury: Patient presents with creatinine elevated up to 2.47 with BUN 43. Creatinine is down to 1.28 this morning. CK was negative. Continue to monitor creatinine,  previously noted to be relatively within normal limits at 1.19 in 2020.    Hypokalemia:  Resolved. Potassium 3.3 on admission. 2.9 this morning. Monitor and replace as necessary.   Bedbugs: Patient has reportedly been lying in bed continuously and came in with bedbugs.  On physical exam patient had some findings suggestive possibly of scabies with rash in between the webs of his feet. Contact precautions are in place and the patient is being treated with permethrin cream.   History of stroke with residual weakness: Patient with previous stroke in December 2020 with acute/subacute bilateral cortical infarction of left postcentral gyrus/corona radiata in addition to punctate cortical infarct of the right parietal lobe; also noted to have remote infarcts and evidence of likely chronic microvascular ischemia.  Patient had negative hypercoagulable work-up at that time. Loop recorder was placed at that time. The device  has been interrogated by [employer]. They have reported to me that there is no evidence of atrial fibrillation on the device.   Systolic congestive heart failure: Patient appears to be hypovolemic at this time. Chest x-ray otherwise noted to be clear.  Last EF noted to be 45 to 50% by echocardiogram in 10/2019. Repeat echocardiogram has demonstrated abnormal septal motion with EF of 50-55% with low normal function and no regional wall motion abnormalities. Diastolic parameters were normal. RV systolic function and size are normal.   Essential hypertension: Home blood pressure medications include amlodipine 10 mg daily and lisinopril-hydrochlorothiazide 20-25 mg daily. Will pursue a permissive approach to BP control for the first 48 hours post stroke. Monitor. Restart home blood pressure medications when medically appropriate.   Elevated ammonia/History of hepatitis C: Ammonia level was just mildly elevated at 36, but liver enzymes noted to be typically within normal limits. Clinically, no signs of HE this morning.   Prolonged QT interval: QTc initially noted to be elevated at 511. Will recheck EKG. Electrolyte abnormality has been corrected.  I have seen and examined this patient myself. I have spent 32 minutes in his evaluation and care.   DVT prophylaxis: scds   Code Status: Full Family Communication: None available Disposition Plan: To be determined  Ava Swayze, DO [facility] Direct contact: see [email or web address]  7PM-7AM contact night coverage as above 05/12/2020, 12:03 PM  LOS: 1 day

## 2020-05-13 ENCOUNTER — Other Ambulatory Visit: Payer: Self-pay

## 2020-05-13 LAB — CSF CULTURE W GRAM STAIN: Culture: NO GROWTH

## 2020-05-13 NOTE — Progress Notes (Signed)
Inpatient Rehab Admissions Coordinator:  Inpatient Rehab consult received. After reviewing chart, noted PT/OT recommending pt receive further therapy in the SNF setting. Pt is not an appropriate candidate for CIR.  I will sign off on this pt.   Gayland Curry, [place], [place] Admissions Coordinator [phone]

## 2020-05-13 NOTE — Progress Notes (Signed)
PROGRESS NOTE  Darren Pearson GYJ:[id number] DOB: 10-17-1966 DOA: 05/09/2020 PCP: [name], NP  Brief History   The patient is a 54 yr old man who was found slumped by the side of his house. 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 He also carries history of hypertension, hyperlipidemia, hepatitis C, anxiety, depression. When he was found he was drooling and not responding. His sister mentioned that in the last 2-3 days the patient was changed. He had been refusing to eat, not taking his medications regularly, and not showering or getting out of bed. She reported that he did complain of having black stools. He has been using ibuprofen.   In the ED the patient was found to have hemoglobin of 6.0. He has been transfused with one unit of PRBC's. The patient's aspirin was held, and GI was consulted. FOBT was negative.  The patient has been admitted to a telemetry bed.   Neurology was consulted. A stroke work-up was initiated.   Although CT head demonstrated only chronic infarcts, MRI did demonstrate small acute infarcts in the basal ganglia, left corona radiata, right occipital white matter, left parietal periventricular white matter, and bilateral frontal white matter. It is felt that this appearance is compatibile emboli. There was also advanced chronic small vessel ischemia.   CTA head and neck was also negative for acute ischemia or infarction, and no intracranial large vessel occlusion or flow limiting stenosis. Carotid arteries are patent. There is moderate stenosis of the proximal right vertebral artery. There is a 35mm left paraclinoid internal carotid artery aneurysm that is unchanged from prior CTA.   Echocardiogram has been completed, but has not yet been read.  Neurology has been consulted as well and the patient has been evaluated by Dr. Erlinda Hong. He has recommended an LP as he feels that patient may be suffering from a  cerebral vasculitis resulting in the patient's odd behavior and mental status changes in the last couple of weeks. It was done this morning. Results are pending.  Gastroenterology has evaluated the patient. They have cleared the patient to resume antiplatelet medication. They state that he should have endoscopy as outpatient after he is recovered from current stroke.   The patient has been transfused with a two more units of PRBC's.   CIR has evaluated this patient and determined that he is not appropriate for CIR. Will continue to seek SNF placement.  Consultants  . Neurology . Gastroenterology . Cardiology  Procedures  . None  Antibiotics   Anti-infectives (From admission, onward)   None     Subjective  The patient is awake and alert, but confused. He gives different answers to his RN and to me about his circumstances prior to admission. No new complaints.   Objective   Vitals:  Vitals:   05/12/20 2143 05/13/20 0753  BP: (!) 145/85 136/82  Pulse: 80 81  Resp: 16 19  Temp: 98.4 F (36.9 C) 97.8 F (36.6 C)  SpO2: 100% 100%   Exam:  Constitutional:  . The patient is awake, alert, and oriented x 2. No acute distress. Respiratory:  . No increased work of breathing. . No wheezes, rales, or rhonchi . No tactile fremitus Cardiovascular:  . Regular rate and rhythm . No murmurs, ectopy, or gallups. . No lateral PMI. No thrills. Abdomen:  . Abdomen is soft, non-tender, non-distended . No hernias, masses, or organomegaly . Normoactive bowel sounds.  Musculoskeletal:  .  No cyanosis, clubbing, or edema Skin:  . No rashes, lesions, ulcers . palpation of skin: no induration or nodules Neurologic:  . Unable to evaluate due to echocardiogram in progress. Psychiatric:  . Mental status o Mood, affect appropriate o Orientation to person, and date/time  . The patient remains confused.   I have personally reviewed the following:   Today's Data  . Vitals, BMP,  CBC  Micro Data  . Blood cultures x 2 (05/09/2020): No growth  Imaging  . CTA head and neck with perfusion study . MRI Brain  Cardiology Data  . Echocardiogram is pending. . EKG  Scheduled Meds: . sodium chloride   Intravenous Once  . atorvastatin  10 mg Oral Daily  . lidocaine (PF)  5 mL Intradermal Once  . permethrin  1 application Topical Once  . sodium chloride flush  3 mL Intravenous Q12H   Continuous Infusions:  Principal Problem:   Episode of unresponsiveness Active Problems:   Hepatitis C   CVA (cerebral vascular accident) ([place])   Hypokalemia   Hyperammonemia ([facility])   Symptomatic anemia   History of CVA (cerebrovascular accident)   Elevated troponin   LOS: 4 days   A & P  CVA: Acute. The patient was admitted to a telemetry bed. Neurology was consulted and stroke work-up was initiated. Although CT head demonstrated only chronic infarcts, MRI did demonstrate small acute infarcts in the basal ganglia, left corona radiata, right occipital white matter, left parietal periventricular white matter, and bilateral frontal white matter. It is felt that this appearance is compatibile with an embolic phenomenon. There was also advanced chronic small vessel ischemia. Echocardiogram, PT/OT eval are pending. TOC has been consulted to find placement. It is assumed that the patient will require SNF placement. Initially ASA was held due to the patient's anemia with Hgb of 6.0 and complaint of melena. GI has now stated that antithrombotics may be restarted, but I have continued to hold them due to the patient's dropping hemoglobin. Neurology feels that this CVA was due to low hemoglobin. Lumbar puncture was negative for signs of autoimmune disease or vasculitis.    Symptomatic anemia: Hemoglobin 6 g/dL on admission. The patient has received 1 unit of PRBC's in transfusion. FOBT performed in the ED and then repeated this morning on the floor have been negative for acute blood. Wyn Quaker this is  due to acute blood loss in the setting of chronic anemia.The patient apparently had been using NSAIDS at home. GI stated on 05/10/2020 that they feel it is safe to resume the patient's antithrombotics.  Patient reported having blood in stools at home in the 2 weeks prior to presentation. Stool guaiacs however were noted to be negative both in ED and on the floor this morning.  Patient was typed and screened and ordered to be transfused 1 unit of packed red blood cells due to his presenting hemoglobin of 6.0. . After this transfusion, his hemoglobin again dropped to 6.4. He received 2 more units at that time. Continue to monitor hemoglobin. ASA has now been stopped again. Hemoglobin now appears stable at 9.5. Continue to monitor.   Elevated troponin: Acute. Troponin mildly elevated at 41.  EKG similar to previous, but with faster rate.  Suspect secondary to demand in setting of anemia.  They have continued to trend up. Will consult Cardiology and continue to follow troponins. Troponins were felt to be elevated due to demand ischemia and anemia in the setting of AKI.   Acute kidney injury:  Patient presents with creatinine elevated up to 2.47 with BUN 43. Creatinine is down to 1.28 this morning. CK was negative. Continue to monitor creatinine,  previously noted to be relatively within normal limits at 1.19 in 2020.    Hypokalemia: Resolved. Potassium 3.3 on admission. 2.9 this morning. Monitor and replace as necessary.   Bedbugs: Patient has reportedly been lying in bed continuously and came in with bedbugs.  On physical exam patient had some findings suggestive possibly of scabies with rash in between the webs of his feet. Contact precautions are in place and the patient is being treated with permethrin cream.   History of stroke with residual weakness: Patient with previous stroke in December 2020 with acute/subacute bilateral cortical infarction of left postcentral gyrus/corona radiata in addition to  punctate cortical infarct of the right parietal lobe; also noted to have remote infarcts and evidence of likely chronic microvascular ischemia.  Patient had negative hypercoagulable work-up at that time. Loop recorder was placed at that time. The device has been interrogated by [employer]. They have reported to me that there is no evidence of atrial fibrillation on the device.   Systolic congestive heart failure: Patient appears to be hypovolemic at this time. Chest x-ray otherwise noted to be clear.  Last EF noted to be 45 to 50% by echocardiogram in 10/2019. Repeat echocardiogram has demonstrated abnormal septal motion with EF of 50-55% with low normal function and no regional wall motion abnormalities. Diastolic parameters were normal. RV systolic function and size are normal.   Essential hypertension: Blood pressures are now normotensive without antihypertensive meds. Monitor.   Elevated ammonia/History of hepatitis C: Ammonia level was just mildly elevated at 36, but liver enzymes noted to be typically within normal limits. Clinically, no signs of HE this morning.   Prolonged QT interval: QTc initially noted to be elevated at 511. Resolved on repeat EKG on 05/10/2020.  I have seen and examined this patient myself. I have spent 30 minutes in his evaluation and care.   DVT prophylaxis: scds   Code Status: Full Family Communication: None available Disposition Plan: SNF.  Jediah Horger, DO [facility] Direct contact: see [email or web address]  7PM-7AM contact night coverage as above 05/13/2020, 4:04 PM  LOS: 1 day

## 2020-05-14 ENCOUNTER — Ambulatory Visit: Payer: Self-pay | Admitting: Adult Health

## 2020-05-14 ENCOUNTER — Encounter: Payer: Self-pay | Admitting: Adult Health

## 2020-05-14 ENCOUNTER — Inpatient Hospital Stay (HOSPITAL_COMMUNITY): Payer: Medicaid Other

## 2020-05-14 LAB — CULTURE, BLOOD (ROUTINE X 2)
Culture: NO GROWTH
Culture: NO GROWTH

## 2020-05-14 LAB — VDRL, CSF: VDRL Quant, CSF: NONREACTIVE

## 2020-05-14 NOTE — Progress Notes (Deleted)
[facility] [address] [place]. [place] [address] [phone]       STROKE FOLLOW UP NOTE  [name] Date of Birth:  [birth year] Medical Record Number:  [id number]   Referring MD:  Rosalin Hawking Reason for Referral: Hospital follow-up  Chief complaint: No chief complaint on file.     HPI:  Today, 05/14/2020, Darren Pearson returns for 55 months stroke follow-up visit as well as recent admission on 05/09/2020 for recurrent stroke.  Presented to the ED with aphasia and found to have small/punctate bilateral multifocal cerebellar infarct, embolic pattern, likely due to severe anemia in setting of advanced small vessel ischemia.  CTA head/neck showed moderate proximal R VA stenosis and stable appearance of 2 mm left paraclinoid ICA aneurysm.  2D echo normal EF.   A1c 5.3.  UDS negative.  Previously on aspirin 81mg  daily held during admission due to severe anemia.  Concern for CNS vasculitis therefore underwent LP which was unremarkable.  Prior stroke 55/8144 multifocal embolic punctate infarcts cryptogenic etiology with placement of loop recorder with residual left hemiparesis.  Underlying cognitive impairment likely vascular dementia exacerbated by severe anemia.  Severe symptomatic anemia requiring transfusion and possible GI follow-up outpatient for colonoscopy.  History of HTN stable.  LDL 52 and initiate atorvastatin 10 mg daily as previously on 40 mg daily but reported noncompliance.  Other stroke risk factors include former tobacco use, history of EtOH and substance use.  Other active problems include B12 deficiency, bedbugs likely scabies, history of hepatitis C, hypokalemia and prolonged QT.  Stroke: Small/punctate B multifocal cerebral infarct, embolic pattern, likely due to severe anemia in the setting of advanced small vessel ischemia.  Code Stroke CT head No acute abnormality. Moderate Small vessel disease. Moderate Atrophy. ASPECTS 10.     CTA head & neck  moderate proximal R VA stenosis. Stable 2 mm L para clinoid ICA aneurysm   CT perfusion no core or penumbra  MRI  Small punctate B cerebral infarcts. Advanced small vessel disease.   2D Echo EF 50-55%  LDL 52  HgbA1c 5.3   UDS neg  Loop recorder interrogation pending  CSF WBC 0, RBC 2, protein 36, glu 69 - no evidence of CNS vasculitis  SCDs for VTE prophylaxis  aspirin 81 mg daily prior to admission, aspirin 81 mg was currently on hold due to severe anemia. OK to resume when feel safe.  Therapy recommendations:  SNF   Disposition:  pending         History copied from prior visit note for reference purposes only Initial visit 55/07/2020 Dr. Leonie Man: Darren Pearson is a 54 year old African-American male seen today for initial office consultation visit following hospital admission at [facility] for stroke in December 2020. History is obtained from the patient, his sister is accompanying her as well as review of electronic medical records and I personally reviewed imaging films in PACS. He is a 54 year old male with past history of hypertension, hyperlipidemia, hepatitis C who presented to [facility] on 11/20/2019 with 1-1/2-week history of having had 3 falls. He denied any focal weakness at that time but CT scan showed diffuse cerebral atrophy and bilateral changes of white matter disease and left pontine remote age lacunar infarct. MRI scan showed acute and subacute punctate bilateral cortical as well as left corona radiata as well as right medial pontine infarcts and remote age lacunar infarcts in the right caudate head, right cerebellum. CT angiogram of the head and neck did not  show any large vessel occlusion but showed only a small left paraclinoid ICA aneurysm. Lower extremity venous Dopplers were negative for DVT. 2D echo showed slightly diminished ejection fraction of 45 to 50% but transesophageal echo was negative for intracardiac thrombus and there was no PFO. LDL cholesterol  was 74 mg percent hemoglobin A1c 4.2. Urine drug screen was negative. HIV was negative. Hypercoagulable panel labs were negative. ANA was negative. Vitamin B12 was low at 170 and TSH was normal. Patient had loop recorder placed on 11/22/2019 and so for paroxysmal A. fib has not been found. Patient had only mild left-sided weakness and incoordination. He states that this is remain. His has trouble with walking and balance. [name] she is almost fallen a few times but managed to catch himself. Does use a cane. Is had no recurrent stroke or TIA symptoms. The patient continues to smoke but knows that he needs to quit smoking. He took aspirin Plavix for 3 weeks and is now stopped Plavix and is on aspirin alone which is tolerating well without bruising or bleeding. He states his blood pressure is usually better controlled but today it is elevated at 160/96. He is tolerating Lipitor well without muscle aches and pains. He denies any prior history of strokes or TIAs but MRI scan clearly showed prior lacunar infarcts. He denies family history of strokes or TIAs ROS:   14 system review of systems is positive for weakness, gait difficulty, imbalance, falls and all other systems negative  PMH:  Past Medical History:  Diagnosis Date  . Allergy   . Anxiety and depression   . Asthma   . Hepatitis C   . History of chicken pox   . Hypertension     Social History:  Social History   Socioeconomic History  . Marital status: Single    Spouse name: Not on file  . Number of children: Not on file  . Years of education: 77  . Highest education level: Not on file  Occupational History  . Occupation: Programmer, applications: [employer]  Tobacco Use  . Smoking status: Former Smoker    Packs/day: 0.25    Types: Cigarettes    Start date: 11/24/2012  . Smokeless tobacco: Never Used  . Tobacco comment: has not smoked since 11/20/2019  Vaping Use  . Vaping Use: Former  Substance and Sexual  Activity  . Alcohol use: Not Currently    Alcohol/week: 2.0 standard drinks    Types: 2 Cans of beer per week    Comment: social, states stopped on 11/20/2019.  Marland Kitchen Drug use: Not Currently    Frequency: 5.0 times per week    Types: Marijuana    Comment: Stop 11/20/2019  . Sexual activity: Yes    Birth control/protection: Condom  Other Topics Concern  . Not on file  Social History Narrative   Regular exercise-yes   Social Determinants of Health   Financial Resource Strain:   . Difficulty of Paying Living Expenses:   Food Insecurity:   . Worried About Charity fundraiser in the Last Year:   . Arboriculturist in the Last Year:   Transportation Needs: No Transportation Needs  . Lack of Transportation (Medical): No  . Lack of Transportation (Non-Medical): No  Physical Activity:   . Days of Exercise per Week:   . Minutes of Exercise per Session:   Stress:   . Feeling of Stress :   Social Connections:   .  Frequency of Communication with Friends and Family:   . Frequency of Social Gatherings with Friends and Family:   . Attends Religious Services:   . Active Member of Clubs or Organizations:   . Attends Archivist Meetings:   Marland Kitchen Marital Status:   Intimate Partner Violence:   . Fear of Current or Ex-Partner:   . Emotionally Abused:   Marland Kitchen Physically Abused:   . Sexually Abused:     Medications:   Current Facility-Administered Medications on File Prior to Visit  Medication Dose Route Frequency Provider Last Rate Last Admin  . 0.9 %  sodium chloride infusion (Manually program via Guardrails IV Fluids)   Intravenous Once Quintella Baton, MD      . acetaminophen (TYLENOL) tablet 650 mg  650 mg Oral Q6H PRN Norval Morton, MD       Or  . acetaminophen (TYLENOL) suppository 650 mg  650 mg Rectal Q6H PRN Smith, Rondell A, MD      . albuterol (PROVENTIL) (2.5 MG/3ML) 0.083% nebulizer solution 2.5 mg  2.5 mg Nebulization Q6H PRN Tamala Julian, Rondell A, MD      . atorvastatin  (LIPITOR) tablet 10 mg  10 mg Oral Daily Rosalin Hawking, MD   10 mg at 05/13/20 1012  . hydrocortisone cream 1 % 1 application  1 application Topical BID PRN Smith, Rondell A, MD      . lidocaine (PF) (XYLOCAINE) 1 % injection 5 mL  5 mL Intradermal Once Swayze, Ava, DO      . permethrin (ELIMITE) 5 % cream 1 application  1 application Topical Once Norval Morton, MD      . Resource ThickenUp Clear   Oral PRN Swayze, Ava, DO      . sodium chloride flush (NS) 0.9 % injection 3 mL  3 mL Intravenous Q12H Tamala Julian, Rondell A, MD   3 mL at 05/13/20 2212   Current Outpatient Medications on File Prior to Visit  Medication Sig Dispense Refill  . acetaminophen (TYLENOL) 325 MG tablet Take 650 mg by mouth every 6 (six) hours as needed for mild pain or headache.    Marland Kitchen amLODipine (NORVASC) 10 MG tablet Take 1 tablet (10 mg total) by mouth daily. 90 tablet 3  . aspirin 81 MG EC tablet Take 1 tablet (81 mg total) by mouth daily. 90 tablet 0  . atorvastatin (LIPITOR) 40 MG tablet Take 1 tablet (40 mg total) by mouth daily at 6 PM. (Patient not taking: Reported on 05/09/2020) 90 tablet 0  . cyanocobalamin 1000 MCG tablet Take 1 tablet (1,000 mcg total) by mouth daily. 90 tablet 0  . lisinopril-hydrochlorothiazide (ZESTORETIC) 20-25 MG tablet Take 1 tablet by mouth daily. 90 tablet 3    Allergies:   Allergies  Allergen Reactions  . Milk-Related Compounds Other (See Comments)    Caused congestion, as a child  . Sulfa Antibiotics Hives     Vitals There were no vitals filed for this visit. There is no height or weight on file to calculate BMI.     Physical Exam General: well developed, well nourished middle-aged African-American male, seated, in no evident distress Head: head normocephalic and atraumatic.   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 Oriented to place and  time. Recent and remote memory intact. Attention span, concentration and fund of knowledge appropriate. Mood and affect appropriate.  Cranial Nerves: Fundoscopic exam reveals sharp disc margins. Pupils equal, briskly reactive to light. Extraocular movements full without nystagmus. Visual fields full to confrontation. Hearing intact. Facial sensation intact. Mild left lower facial weakness., tongue, palate moves normally and symmetrically.  Motor: Mild spastic left hemiparesis with 4/5 strength with weakness of left grip as well as intrinsic hand muscles and left hip flexors and ankle dorsiflexors. Tone is increased on the left compared to the right.. Sensory.: intact to touch , pinprick , position and vibratory sensation.  Coordination: Slightly impaired left finger-to-nose and neutral coordination. Normal on the right. Gait and Station: Arises from chair with  difficulty. Walks with slight's stiffness and circumduction of the left leg and is off balance and standing on a narrow base and while making a turn. Unable to do tandem walk Reflexes: 1+ and symmetric. Toes downgoing.      NIHSS  3  Modified Rankin  2   ASSESSMENT: 54 year old African-American male with multiple by cerebral cortical and subcortical infarcts likely of mixed cryptogenic etiology and small vessel disease in December 2020. Multiple vascular risk factors of smoking, hypertension, hyperlipidemia, peripheral arterial disease and prior strokes     PLAN: Cryptogenic stroke  continue Plavix for secondary stroke prevention and maintain strict control of hypertension with blood pressure goal below 130/90, diabetes with hemoglobin A1c goal below 6.5% and lipids with LDL cholesterol goal below 70 mg/dL. I also advised the patient to eat a healthy diet with plenty of whole grains,  cereals, fruits and vegetables, exercise regularly and maintain ideal body weight   outpatient physical and occupational therapy for gait and balance.  HTN Stable.  Continue to follow PCP for monitoring and management  HLD Continuation of atorvastatin and continue to follow PCP for prescribing, monitoring management    He was counseled to quit smoking and to seek help from primary care physician for the same. Greater than 50% time during this 45-minute consultation visit was spent on counseling and coordination of care about his multiple strokes and discussion about stroke prevention and treatment and answering questions followup in the future with my nurse practitioner Janett Billow in 3 months or call earlier if necessary.  Frann Rider, AGNP-BC  [facility] [address] [place], [place] [address]  Phone [phone] Fax [phone] Note: This document was prepared with digital dictation and possible smart phrase technology. Any transcriptional errors that result from this process are unintentional.

## 2020-05-14 NOTE — Progress Notes (Signed)
Modified Barium Swallow Progress Note  Patient Details  Name: Darren Pearson MRN: [id number] Date of Birth: 1966-03-26  Today's Date: 05/14/2020  Modified Barium Swallow completed.  Full report located under Chart Review in the Imaging Section.  Brief recommendations include the following:  Clinical Impression  Pt presents with normal oropharyngeal swallow with adequate mastication and bolus control/propulsion, timely swallow response, adequate laryngeal-vestibule closure consistently across all PO trials. No penetration/aspiration observed.  Notable was some retention of liquid consistencies in what appeared to be a diverticulum-like pouch near the petiole of the epiglottis (A-P view confirmed presence on right side of pharynx).  No radiologist present to dx finding.  Otherwise, there were no impairments noted during the swallow.  Continue current regular diet, thin liquids.     Swallow Evaluation Recommendations       SLP Diet Recommendations: Regular solids;Thin liquid   Liquid Administration via: Cup;Straw   Medication Administration: Whole meds with liquid   Supervision: Patient able to self feed       Postural Changes: Remain semi-upright after after feeds/meals (Comment)   Oral Care Recommendations: Oral care BID       Swathi Dauphin L. Tivis Ringer, [place] Office number [phone] Pager [phone]  Juan Quam Laurice 05/14/2020,2:37 PM

## 2020-05-14 NOTE — Progress Notes (Signed)
  Speech Language Pathology Treatment: Dysphagia;Cognitive-Linquistic  Patient Details Name: AYCE PIETRZYK MRN: [id number] DOB: 1966-08-05 Today's Date: 05/14/2020 Time: 2010-0712 SLP Time Calculation (min) (ACUTE ONLY): 22 min  Assessment / Plan / Recommendation Clinical Impression  Pt alert during session focusing on swallowing, language and cognition. Sustained attention waxed and waned as pt engaged in divergent naming activity and responded to biographical questions. Named 1 item independently in simple categories and therapist introduced using alphabet as strategy to generate responses. He was unable to utilize this session but stimulable with numerous phonemic and semantic clues. Spontaneous responses limited however expressive language appears somewhat improved compared to prior session. There were no paraphasias noted general phrases and conversational fillers present.  Pt has had subtle signs of aspiration inconsistently with concern for possible silent aspiration. Immediate coughing inconsistently today with water via cup therefore recommend pt undergo instrumental testing with MBS scheduled for 12:30. It is hopeful pt may be able to consistently demonstrate postures that will prevent airway intrusion if present.    HPI HPI: ILYAS LIPSITZ is a 54 y.o. male with medical history significant of hypertension, hyperlipidemia, CVA with residual left-sided hemiparesis, hepatitis C, anxiety, and depression who presents after his sister found him slouch against the house outside. Over the last couple of days patient had been refusing to eat certain meals throughout the day despite his sister fixing things that he usually likes.  Over the last 3 to 4 weeks he had not been his normal self, was not taking his medications regularly, was not getting out of bed, or taking showers.  Patient has reportedly been lying in bed continuously and came in with bedbugs.  On physical exam patient had some findings  suggestive possibly of scabies with rash in between the webs of his feet. CT negative, but MRI shows Small acute bilateral cerebral infarcts compatible with emboli.      SLP Plan  MBS       Recommendations  Diet recommendations: Regular;Thin liquid Liquids provided via: Cup Medication Administration: Whole meds with puree Supervision: Patient able to self feed;Intermittent supervision to cue for compensatory strategies Compensations: Slow rate;Small sips/bites Postural Changes and/or Swallow Maneuvers: Seated upright 90 degrees                Oral Care Recommendations: Oral care BID Follow up Recommendations: Skilled Nursing facility SLP Visit Diagnosis: Dysphagia, unspecified (R13.10) Plan: MBS       GO                Houston Siren 05/14/2020, 10:49 AM  Orbie Pyo Colvin Caroli.Ed Risk analyst [phone] Office [phone]

## 2020-05-14 NOTE — Progress Notes (Addendum)
PROGRESS NOTE  Darren Pearson LPF:[id number] DOB: 1966-03-10 DOA: 05/09/2020 PCP: [name], NP  Brief History   The patient is a 54 yr old man who was found slumped by the side of his house. 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 He also carries history of hypertension, hyperlipidemia, hepatitis C, anxiety, depression. When he was found he was drooling and not responding. His sister mentioned that in the last 2-3 days the patient was changed. He had been refusing to eat, not taking his medications regularly, and not showering or getting out of bed. She reported that he did complain of having black stools. He has been using ibuprofen.   In the ED the patient was found to have hemoglobin of 6.0. He has been transfused with one unit of PRBC's. The patient's aspirin was held, and GI was consulted. FOBT was negative.  The patient has been admitted to a telemetry bed.   Neurology was consulted. A stroke work-up was initiated.   Although CT head demonstrated only chronic infarcts, MRI did demonstrate small acute infarcts in the basal ganglia, left corona radiata, right occipital white matter, left parietal periventricular white matter, and bilateral frontal white matter. It is felt that this appearance is compatibile emboli. There was also advanced chronic small vessel ischemia.   CTA head and neck was also negative for acute ischemia or infarction, and no intracranial large vessel occlusion or flow limiting stenosis. Carotid arteries are patent. There is moderate stenosis of the proximal right vertebral artery. There is a 59mm left paraclinoid internal carotid artery aneurysm that is unchanged from prior CTA.   Echocardiogram has been completed, but has not yet been read.  Neurology has been consulted as well and the patient has been evaluated by Dr. Erlinda Hong. He has recommended an LP as he feels that patient may be suffering from a  cerebral vasculitis resulting in the patient's odd behavior and mental status changes in the last couple of weeks. It was done this morning. Results are pending.  Gastroenterology has evaluated the patient. They have cleared the patient to resume antiplatelet medication. They state that he should have endoscopy as outpatient after he is recovered from current stroke.   The patient has been transfused with a two more units of PRBC's.   CIR has evaluated this patient and determined that he is not appropriate for CIR. Will continue to seek SNF placement.  SLP has evaluated the patient. He has been cleared for a regular diet with thin liquids.  Consultants  . Neurology . Gastroenterology . Cardiology  Procedures  . None  Antibiotics   Anti-infectives (From admission, onward)   None     Subjective  The patient is awake and alert, but confused.  No new complaints.   Objective   Vitals:  Vitals:   05/13/20 2231 05/14/20 0741  BP: (!) 145/74 139/84  Pulse:  75  Resp:  20  Temp:  97.9 F (36.6 C)  SpO2:  100%   Exam:  Constitutional:  . The patient is awake, alert, and oriented x 2. No acute distress. Respiratory:  . No increased work of breathing. . No wheezes, rales, or rhonchi . No tactile fremitus Cardiovascular:  . Regular rate and rhythm . No murmurs, ectopy, or gallups. . No lateral PMI. No thrills. Abdomen:  . Abdomen is soft, non-tender, non-distended . No hernias, masses, or organomegaly . Normoactive bowel sounds.  Musculoskeletal:  . No  cyanosis, clubbing, or edema Skin:  . No rashes, lesions, ulcers . palpation of skin: no induration or nodules Neurologic:  . Unable to evaluate due to echocardiogram in progress. Psychiatric:  . Mental status o Mood, affect appropriate o Orientation to person, and date/time  . The patient remains confused.   I have personally reviewed the following:   Today's Data  . Orthoptist  . Blood cultures x 2  (05/09/2020): No growth  Imaging  . CTA head and neck with perfusion study . MRI Brain  Cardiology Data  . Echocardiogram is pending. . EKG  Scheduled Meds: . sodium chloride   Intravenous Once  . atorvastatin  10 mg Oral Daily  . lidocaine (PF)  5 mL Intradermal Once  . permethrin  1 application Topical Once  . sodium chloride flush  3 mL Intravenous Q12H   Continuous Infusions:  Principal Problem:   Episode of unresponsiveness Active Problems:   Hepatitis C   CVA (cerebral vascular accident) ([place])   Hypokalemia   Hyperammonemia ([facility])   Symptomatic anemia   History of CVA (cerebrovascular accident)   Elevated troponin   LOS: 5 days   A & P  CVA: Acute. The patient was admitted to a telemetry bed. Neurology was consulted and stroke work-up was initiated. Although CT head demonstrated only chronic infarcts, MRI did demonstrate small acute infarcts in the basal ganglia, left corona radiata, right occipital white matter, left parietal periventricular white matter, and bilateral frontal white matter. It is felt that this appearance is compatibile with an embolic phenomenon. There was also advanced chronic small vessel ischemia. Echocardiogram, PT/OT eval are pending. TOC has been consulted to find placement. It is assumed that the patient will require SNF placement. Initially ASA was held due to the patient's anemia with Hgb of 6.0 and complaint of melena. GI has now stated that antithrombotics may be restarted, but I have continued to hold them due to the patient's dropping hemoglobin. Neurology feels that this CVA was due to low hemoglobin. Lumbar puncture was negative for signs of autoimmune disease or vasculitis.    Symptomatic anemia: Hemoglobin 6 g/dL on admission. The patient has received 1 unit of PRBC's in transfusion. FOBT performed in the ED and then repeated this morning on the floor have been negative for acute blood. Wyn Quaker this is due to acute blood loss in the setting of  chronic anemia.The patient apparently had been using NSAIDS at home. GI stated on 05/10/2020 that they feel it is safe to resume the patient's antithrombotics.  Patient reported having blood in stools at home in the 2 weeks prior to presentation. Stool guaiacs however were noted to be negative both in ED and on the floor this morning.  Patient was typed and screened and ordered to be transfused 1 unit of packed red blood cells due to his presenting hemoglobin of 6.0. . After this transfusion, his hemoglobin again dropped to 6.4. He received 2 more units at that time. Continue to monitor hemoglobin. ASA has now been stopped again. Hemoglobin now appears stable at 9.5. Continue to monitor.   Elevated troponin: Acute. Troponin mildly elevated at 41.  EKG similar to previous, but with faster rate.  Suspect secondary to demand in setting of anemia.  They have continued to trend up. Will consult Cardiology and continue to follow troponins. Troponins were felt to be elevated due to demand ischemia and anemia in the setting of AKI.  Dysphagia: SLP has cleared the patient for  regular diet with thin liquids.   Acute kidney injury: Patient presents with creatinine elevated up to 2.47 with BUN 43. Creatinine is down to 1.28 this morning. CK was negative. Continue to monitor creatinine,  previously noted to be relatively within normal limits at 1.19 in 2020.    Hypokalemia: Resolved. Potassium 3.3 on admission. 2.9 this morning. Monitor and replace as necessary.  Hypomagnesemia: Supplement magnesium and monitor.   Bedbugs: Patient has reportedly been lying in bed continuously and came in with bedbugs.  On physical exam patient had some findings suggestive possibly of scabies with rash in between the webs of his feet. Contact precautions are in place and the patient is being treated with permethrin cream.   History of stroke with residual weakness: Patient with previous stroke in December 2020 with acute/subacute  bilateral cortical infarction of left postcentral gyrus/corona radiata in addition to punctate cortical infarct of the right parietal lobe; also noted to have remote infarcts and evidence of likely chronic microvascular ischemia.  Patient had negative hypercoagulable work-up at that time. Loop recorder was placed at that time. The device has been interrogated by [employer]. They have reported to me that there is no evidence of atrial fibrillation on the device.   Systolic congestive heart failure: Patient appears to be hypovolemic at this time. Chest x-ray otherwise noted to be clear.  Last EF noted to be 45 to 50% by echocardiogram in 10/2019. Repeat echocardiogram has demonstrated abnormal septal motion with EF of 50-55% with low normal function and no regional wall motion abnormalities. Diastolic parameters were normal. RV systolic function and size are normal.   Essential hypertension: Blood pressures are now normotensive without antihypertensive meds. Monitor.   Elevated ammonia/History of hepatitis C: Ammonia level was just mildly elevated at 36, but liver enzymes noted to be typically within normal limits. Clinically, no signs of HE this morning.   Prolonged QT interval: QTc initially noted to be elevated at 511. Resolved on repeat EKG on 05/10/2020.  I have seen and examined this patient myself. I have spent 32 minutes in his evaluation and care.   DVT prophylaxis: scds   Code Status: Full Family Communication: None available Disposition Plan: SNF.  Darren Plucinski, DO [facility] Direct contact: see [email or web address]  7PM-7AM contact night coverage as above 05/14/2020, 2:54 PM  LOS: 1 day

## 2020-05-14 NOTE — Progress Notes (Signed)
Physical Therapy Treatment Patient Details Name: Darren Pearson MRN: [id number] DOB: 1966-07-26 Today's Date: 05/14/2020    History of Present Illness Pt is 54 yo male who was found slumped by the side of his house and was brought to [facility].  Pt found to have small acute infarcts in the basal ganglia, left corona radiata, right occipital white matter, left parietal periventricular white matter, and bilateral frontal white matter.  Additionally, pt found to have hgb of 6.0 and has received PRBC.  Pt s/p lumbar puncture to evaluate for possible vasculitis, results pending.  Pt with PMH of previous CVA in 11/2019 with residual left sided weakness.    PT Comments    Pt able to progress ambulation.  He did have multiple LOB with and without RW but improved with RW.  Tendency to scissor gait and with posterior lean and decreased toe off.  Pt needing mod-max cues for transfer techniques, balance exercises, and for focus on task at hand.   Continues to be high fall risk and unsafe to return home from PT perspective.  Recommend SNF.   Follow Up Recommendations  SNF     Equipment Recommendations  Rolling walker with 5" wheels    Recommendations for Other Services       Precautions / Restrictions Precautions Precautions: Fall    Mobility  Bed Mobility Overal bed mobility: Needs Assistance Bed Mobility: Supine to Sit;Sit to Supine     Supine to sit: Min assist;HOB elevated Sit to supine: Min assist;HOB elevated   General bed mobility comments: Increased time; multimodal cues for complete transfer and scooting forward.  Transfers Overall transfer level: Needs assistance Equipment used: Rolling walker (2 wheeled);None Transfers: Sit to/from Stand Sit to Stand: Min assist         General transfer comment: min A for steadying; cues for safe hand placement  Ambulation/Gait Ambulation/Gait assistance: Min assist Gait Distance (Feet): 70 Feet (x3; standing rest breaks) Assistive  device: Rolling walker (2 wheeled);None Gait Pattern/deviations: Wide base of support;Scissoring;Decreased stride length;Step-through pattern Gait velocity: decreased   General Gait Details: Wide BOS but with occasional scissoring, decreased toe off with tendency to posterior lean at times.  Started with no walker and pt with frequent scissoring and 8 LOB over 70'.  Added walker for the remainder of the walk with 3 LOB over 140'. Pt was cued to focus on walking and on limiting scissoring gait.   Stairs             Wheelchair Mobility    Modified Rankin (Stroke Patients Only) Modified Rankin (Stroke Patients Only) Pre-Morbid Rankin Score: Moderately severe disability Modified Rankin: Moderately severe disability     Balance Overall balance assessment: Needs assistance Sitting-balance support: No upper extremity supported Sitting balance-Leahy Scale: Good     Standing balance support: Single extremity supported Standing balance-Leahy Scale: Fair Standing balance comment: Performed balance task and pt needing at least touch with 1 UE for balance               High Level Balance Comments: Performed standing reaching 5" outside BOS and marching but pt had difficulty without 1 UE light support.  He was able to stand feet apart EO and EC and feet together EO and EC without UE support.            Cognition Arousal/Alertness: Awake/alert Behavior During Therapy: Flat affect Overall Cognitive Status: Impaired/Different from baseline Area of Impairment: Orientation;Safety/judgement;Problem solving;Following commands;Attention;Awareness;Memory  Orientation Level: Person   Memory: Decreased short-term memory;Decreased recall of precautions Following Commands: Follows one step commands inconsistently     Problem Solving: Slow processing;Difficulty sequencing;Requires verbal cues;Requires tactile cues;Decreased initiation General Comments: Required  max cues for task and repeated cues to stay on task.  Pt easily distracted by tv and people in the hall.      Exercises      General Comments        Pertinent Vitals/Pain Pain Assessment: No/denies pain Faces Pain Scale: No hurt    Home Living                      Prior Function            PT Goals (current goals can now be found in the care plan section) Acute Rehab PT Goals Patient Stated Goal: pt unable to state; sister agreeable to rehab PT Goal Formulation: With patient/family Time For Goal Achievement: 05/24/20 Potential to Achieve Goals: Fair Progress towards PT goals: Progressing toward goals    Frequency    Min 3X/week      PT Plan Current plan remains appropriate    Co-evaluation              AM-PAC PT "6 Clicks" Mobility   Outcome Measure  Help needed turning from your back to your side while in a flat bed without using bedrails?: A Little Help needed moving from lying on your back to sitting on the side of a flat bed without using bedrails?: A Little Help needed moving to and from a bed to a chair (including a wheelchair)?: A Little Help needed standing up from a chair using your arms (e.g., wheelchair or bedside chair)?: A Little Help needed to walk in hospital room?: A Little Help needed climbing 3-5 steps with a railing? : A Lot 6 Click Score: 17    End of Session Equipment Utilized During Treatment: Gait belt Activity Tolerance: Patient tolerated treatment well Patient left: with call bell/phone within reach;in bed;with bed alarm set Nurse Communication: Mobility status PT Visit Diagnosis: Unsteadiness on feet (R26.81);Muscle weakness (generalized) (M62.81)     Time: 8299-3716 PT Time Calculation (min) (ACUTE ONLY): 25 min  Charges:  $Gait Training: 8-22 mins $Neuromuscular Re-education: 8-22 mins                     Abran Richard, PT Acute Rehab Services Pager [phone] [facility] [place] 05/14/2020, 11:35 AM

## 2020-05-14 NOTE — TOC Progression Note (Addendum)
Transition of Care [facility]) - Progression Note    Patient Details  Name: Darren Pearson MRN: [id number] Date of Birth: [birth year]  Transition of Care [facility]) CM/SW [place],  Phone Number: 05/14/2020, 10:46 AM  Clinical Narrative:     CSW contacted [facility]; unable to take due to no beds available.   CSW contacted [facility]; unable to accept pt.   CSW contacted [facility]; Freda Munro informed that bed depends on progress of disability/medicaid application.   CSW contacted pts sister [name]. [name] explains that there is some documentation for pts disability/ medicaid application but needs to find documents and call back at later time. CSW provides [name] with CSW's phone number.   [name] returned call to [place] and provided CSW  Medicaid case number of #[id number]. [name] also explain that she spoke with someone from [place] who said they may approve medicaid today.   Expected Discharge Plan: [place] Barriers to Discharge: Continued Medical Work up, Inadequate or no insurance  Expected Discharge Plan and Services Expected Discharge Plan: [place] arrangements for the past 2 months: Single Family Home                                       Social Determinants of Health (SDOH) Interventions    Readmission Risk Interventions No flowsheet data found.

## 2020-05-15 NOTE — Progress Notes (Signed)
PROGRESS NOTE  Darren Pearson RKY:[id number] DOB: 1965/12/05 DOA: 05/09/2020 PCP: [name], NP  Brief History   The patient is a 54 yr old man who was found slumped by the side of his house. 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 He also carries history of hypertension, hyperlipidemia, hepatitis C, anxiety, depression. When he was found he was drooling and not responding. His sister mentioned that in the last 2-3 days the patient was changed. He had been refusing to eat, not taking his medications regularly, and not showering or getting out of bed. She reported that he did complain of having black stools. He has been using ibuprofen.   In the ED the patient was found to have hemoglobin of 6.0. He has been transfused with one unit of PRBC's. The patient's aspirin was held, and GI was consulted. FOBT was negative.  The patient has been admitted to a telemetry bed.   Neurology was consulted. A stroke work-up was initiated.   Although CT head demonstrated only chronic infarcts, MRI did demonstrate small acute infarcts in the basal ganglia, left corona radiata, right occipital white matter, left parietal periventricular white matter, and bilateral frontal white matter. It is felt that this appearance is compatibile emboli. There was also advanced chronic small vessel ischemia.   CTA head and neck was also negative for acute ischemia or infarction, and no intracranial large vessel occlusion or flow limiting stenosis. Carotid arteries are patent. There is moderate stenosis of the proximal right vertebral artery. There is a 8mm left paraclinoid internal carotid artery aneurysm that is unchanged from prior CTA.   Echocardiogram has been completed and demonstrates LVED of 50-55% with abnormal septal motion and low normal function. Diastolic parameters are normal. RV systolic function and size is normal.   Neurology has been  consulted as well and the patient has been evaluated by Dr. Erlinda Hong. He has recommended an LP as he feels that patient may be suffering from a cerebral vasculitis resulting in the patient's odd behavior and mental status changes in the last couple of weeks. It was done this morning. LP failed to demonstrate evidence of autoimmune process or vasculitis. It is felt that stroke was a result of the patient's anemia.   Gastroenterology has evaluated the patient. They have cleared the patient to resume antiplatelet medication. They state that he should have endoscopy as outpatient after he is recovered from current stroke.   The patient has been transfused with a two more units of PRBC's.         SLP has evaluated the patient. He has been cleared for a regular diet with thin liquids. CIR has evaluated this patient and determined that he is not appropriate for CIR. Will continue to seek SNF placement.  Consultants  . Neurology . Gastroenterology . Cardiology  Procedures  . None  Antibiotics   Anti-infectives (From admission, onward)   None     Subjective  The patient is awake and alert.  No new complaints.   Objective   Vitals:  Vitals:   05/14/20 2354 05/15/20 0740  BP: (!) 144/94 138/85  Pulse: 83 73  Resp: 16 16  Temp: 98.3 F (36.8 C) 98.5 F (36.9 C)  SpO2: 99% 100%   Exam:  Constitutional:  . The patient is awake, alert, and oriented x 2. No acute distress. Respiratory:  . No increased work of breathing. . No wheezes, rales, or rhonchi .  No tactile fremitus Cardiovascular:  . Regular rate and rhythm . No murmurs, ectopy, or gallups. . No lateral PMI. No thrills. Abdomen:  . Abdomen is soft, non-tender, non-distended . No hernias, masses, or organomegaly . Normoactive bowel sounds.  Musculoskeletal:  . No cyanosis, clubbing, or edema Skin:  . No rashes, lesions, ulcers . palpation of skin: no induration or nodules Neurologic:  . Unable to evaluate due to  echocardiogram in progress. Psychiatric:  . Mental status o Mood, affect appropriate o Orientation to person, and date/time  . The patient remains confused.   I have personally reviewed the following:   Today's Data  . Orthoptist  . Blood cultures x 2 (05/09/2020): No growth  Imaging  . CTA head and neck with perfusion study . MRI Brain  Cardiology Data  . Echocardiogram is pending. . EKG  Scheduled Meds: . sodium chloride   Intravenous Once  . atorvastatin  10 mg Oral Daily  . lidocaine (PF)  5 mL Intradermal Once  . permethrin  1 application Topical Once  . sodium chloride flush  3 mL Intravenous Q12H   Continuous Infusions:  Principal Problem:   Episode of unresponsiveness Active Problems:   Hepatitis C   CVA (cerebral vascular accident) ([place])   Hypokalemia   Hyperammonemia (HCC)   Symptomatic anemia   History of CVA (cerebrovascular accident)   Elevated troponin   LOS: 6 days   A & P  CVA: Acute. The patient was admitted to a telemetry bed. Neurology was consulted and stroke work-up was initiated. Although CT head demonstrated only chronic infarcts, MRI did demonstrate small acute infarcts in the basal ganglia, left corona radiata, right occipital white matter, left parietal periventricular white matter, and bilateral frontal white matter. It is felt that this appearance is compatibile with an embolic phenomenon. There was also advanced chronic small vessel ischemia. Echocardiogram, PT/OT eval are pending. TOC has been consulted to find placement. It is assumed that the patient will require SNF placement. Initially ASA was held due to the patient's anemia with Hgb of 6.0 and complaint of melena. GI has now stated that antithrombotics may be restarted, but I have continued to hold them due to the patient's dropping hemoglobin. Neurology feels that this CVA was due to low hemoglobin. Lumbar puncture was negative for signs of autoimmune disease or vasculitis.      Symptomatic anemia: Hemoglobin 6 g/dL on admission. The patient has received 1 unit of PRBC's in transfusion. FOBT performed in the ED and then repeated this morning on the floor have been negative for acute blood. Wyn Quaker this is due to acute blood loss in the setting of chronic anemia.The patient apparently had been using NSAIDS at home. GI stated on 05/10/2020 that they feel it is safe to resume the patient's antithrombotics.  Patient reported having blood in stools at home in the 2 weeks prior to presentation. Stool guaiacs however were noted to be negative both in ED and on the floor this morning.  Patient was typed and screened and ordered to be transfused 1 unit of packed red blood cells due to his presenting hemoglobin of 6.0. . After this transfusion, his hemoglobin again dropped to 6.4. He received 2 more units at that time. Continue to monitor hemoglobin. ASA has now been stopped again. Hemoglobin now appears stable at 9.5. Continue to monitor.   Elevated troponin: Acute. Troponin mildly elevated at 41.  EKG similar to previous, but with faster rate.  Suspect secondary  to demand in setting of anemia.  They have continued to trend up. Will consult Cardiology and continue to follow troponins. Troponins were felt to be elevated due to demand ischemia and anemia in the setting of AKI.  Dysphagia: SLP has cleared the patient for regular diet with thin liquids.   Acute kidney injury: Patient presents with creatinine elevated up to 2.47 with BUN 43. Creatinine is down to 1.28 this morning. CK was negative. Continue to monitor creatinine,  previously noted to be relatively within normal limits at 1.19 in 2020.    Hypokalemia: Resolved. Potassium 3.3 on admission. 2.9 this morning. Monitor and replace as necessary.  Hypomagnesemia: Supplement magnesium and monitor.   Bedbugs: Patient has reportedly been lying in bed continuously and came in with bedbugs.  On physical exam patient had some findings  suggestive possibly of scabies with rash in between the webs of his feet. Contact precautions are in place and the patient is being treated with permethrin cream.   History of stroke with residual weakness: Patient with previous stroke in December 2020 with acute/subacute bilateral cortical infarction of left postcentral gyrus/corona radiata in addition to punctate cortical infarct of the right parietal lobe; also noted to have remote infarcts and evidence of likely chronic microvascular ischemia.  Patient had negative hypercoagulable work-up at that time. Loop recorder was placed at that time. The device has been interrogated by [employer]. They have reported to me that there is no evidence of atrial fibrillation on the device.   Systolic congestive heart failure: Patient appears to be hypovolemic at this time. Chest x-ray otherwise noted to be clear.  Last EF noted to be 45 to 50% by echocardiogram in 10/2019. Repeat echocardiogram has demonstrated abnormal septal motion with EF of 50-55% with low normal function and no regional wall motion abnormalities. Diastolic parameters were normal. RV systolic function and size are normal.   Essential hypertension: Blood pressures are now normotensive without antihypertensive meds. Monitor.   Elevated ammonia/History of hepatitis C: Ammonia level was just mildly elevated at 36, but liver enzymes noted to be typically within normal limits. Clinically, no signs of HE this morning.   Prolonged QT interval: QTc initially noted to be elevated at 511. Resolved on repeat EKG on 05/10/2020.  I have seen and examined this patient myself. I have spent 30 minutes in his evaluation and care.   DVT prophylaxis: scds   Code Status: Full Family Communication: None available Disposition Plan: SNF.  Darlisha Kelm, DO [facility] Direct contact: see [email or web address]  7PM-7AM contact night coverage as above 05/15/2020, 3:02 PM  LOS: 1 day

## 2020-05-15 NOTE — TOC Progression Note (Addendum)
Transition of Care [facility]) - Progression Note    Patient Details  Name: Darren Pearson MRN: [id number] Date of Birth: [birth year]  Transition of Care [facility]) CM/SW [place], [place] Phone Number: 05/15/2020, 2:26 PM  Clinical Narrative:     CSW notified Freda Munro from [facility] of pts medicaid case number.   CSW contacted Pt sister and inquired if she had update regarding medicaid application. She stated she would contact DSS and return call to [place].   1623: Sister notified CSW that DSS approved and provided CSW with Ms Fasheyitan's phone number from [place] to call. CSW contacted Ms Ervin Knack and confirmed that pt's medicaid was approved. Ms Ervin Knack emailed CSW confirmation of Medicaid approval. CSW emailed fl2 back to Ms Ervin Knack.   Pts Medicaid Identification number is [id number] Q Approved retroactively from December 2020 - January 21 2021.  Case Worker Listed as Armed forces operational officer.   Expected Discharge Plan: [place] Barriers to Discharge: Continued Medical Work up, Inadequate or no insurance  Expected Discharge Plan and Services Expected Discharge Plan: [place] arrangements for the past 2 months: Single Family Home                                       Social Determinants of Health (SDOH) Interventions    Readmission Risk Interventions No flowsheet data found.

## 2020-05-16 DIAGNOSIS — I248 Other forms of acute ischemic heart disease: Secondary | ICD-10-CM

## 2020-05-16 DIAGNOSIS — N179 Acute kidney failure, unspecified: Secondary | ICD-10-CM | POA: Diagnosis present

## 2020-05-16 DIAGNOSIS — N182 Chronic kidney disease, stage 2 (mild): Secondary | ICD-10-CM | POA: Diagnosis present

## 2020-05-16 DIAGNOSIS — D62 Acute posthemorrhagic anemia: Secondary | ICD-10-CM

## 2020-05-16 LAB — CBC WITH DIFFERENTIAL/PLATELET
Abs Immature Granulocytes: 0.02 10*3/uL (ref 0.00–0.07)
Basophils Absolute: 0 10*3/uL (ref 0.0–0.1)
Basophils Relative: 1 %
Eosinophils Absolute: 0.3 10*3/uL (ref 0.0–0.5)
Eosinophils Relative: 5 %
HCT: 32.8 % — ABNORMAL LOW (ref 39.0–52.0)
Hemoglobin: 10.2 g/dL — ABNORMAL LOW (ref 13.0–17.0)
Immature Granulocytes: 0 %
Lymphocytes Relative: 20 %
Lymphs Abs: 1.4 10*3/uL (ref 0.7–4.0)
MCH: 25.1 pg — ABNORMAL LOW (ref 26.0–34.0)
MCHC: 31.1 g/dL (ref 30.0–36.0)
MCV: 80.6 fL (ref 80.0–100.0)
Monocytes Absolute: 0.7 10*3/uL (ref 0.1–1.0)
Monocytes Relative: 10 %
Neutro Abs: 4.5 10*3/uL (ref 1.7–7.7)
Neutrophils Relative %: 64 %
Platelets: 323 10*3/uL (ref 150–400)
RBC: 4.07 MIL/uL — ABNORMAL LOW (ref 4.22–5.81)
RDW: 18.4 % — ABNORMAL HIGH (ref 11.5–15.5)
WBC: 6.9 10*3/uL (ref 4.0–10.5)
nRBC: 0 % (ref 0.0–0.2)

## 2020-05-16 LAB — BASIC METABOLIC PANEL
Anion gap: 9 (ref 5–15)
BUN: 14 mg/dL (ref 6–20)
CO2: 25 mmol/L (ref 22–32)
Calcium: 9 mg/dL (ref 8.9–10.3)
Chloride: 104 mmol/L (ref 98–111)
Creatinine, Ser: 1.21 mg/dL (ref 0.61–1.24)
GFR calc Af Amer: >60 mL/min (ref >60)
GFR calc non Af Amer: >60 mL/min (ref >60)
Glucose, Bld: 91 mg/dL (ref 70–99)
Potassium: 4.1 mmol/L (ref 3.5–5.1)
Sodium: 138 mmol/L (ref 135–145)

## 2020-05-16 NOTE — Progress Notes (Signed)
Physical Therapy Treatment Patient Details Name: Darren Pearson MRN: [id number] DOB: 09-19-1966 Today's Date: 05/16/2020    History of Present Illness Pt is 54 yo male who was found slumped by the side of his house and was brought to [employer] [facility].  Pt found to have small acute infarcts in the basal ganglia, left corona radiata, right occipital white matter, left parietal periventricular white matter, and bilateral frontal white matter.  Additionally, pt found to have hgb of 6.0 and has received PRBC.  Pt s/p lumbar puncture to evaluate for possible vasculitis, results pending.  Pt with PMH of previous CVA in 11/2019 with residual left sided weakness.    PT Comments    Pt continuing to present with flat affect, but periodic smiling and tearfulness during session. Pt unable to state why he is tearful, states "I'll get through" when asked to elaborate. Pt ambulated hallway distance with use of RW, as well as multimodal cuing and steadying assist as needed. Pt with limited command following, but puts forth great effort during therapy. Will continue to follow acutely.     Follow Up Recommendations  SNF     Equipment Recommendations  Rolling walker with 5" wheels    Recommendations for Other Services       Precautions / Restrictions Precautions Precautions: Fall Restrictions Weight Bearing Restrictions: No    Mobility  Bed Mobility Overal bed mobility: Needs Assistance             General bed mobility comments: up in recliner  Transfers Overall transfer level: Needs assistance Equipment used: Rolling walker (2 wheeled);None Transfers: Sit to/from Stand Sit to Stand: Min assist;Min guard         General transfer comment: min assist to steady, max multimodal cuing for safe hand placement followed inconsistently.  Ambulation/Gait Ambulation/Gait assistance: Min assist Gait Distance (Feet): 65 Feet (x2) Assistive device: Rolling walker (2 wheeled) Gait Pattern/deviations:  Step-through pattern;Decreased stride length;Trunk flexed;Wide base of support Gait velocity: decreased   General Gait Details: min assist for occasional steadying, guiding pt and RW. Pt with periodic leaning L and R with RW lift-off. Verbal cuing for upright posture, placemetn in RW, and slow turns during directional changes.   Stairs             Wheelchair Mobility    Modified Rankin (Stroke Patients Only) Modified Rankin (Stroke Patients Only) Pre-Morbid Rankin Score: Moderately severe disability Modified Rankin: Moderately severe disability     Balance Overall balance assessment: Needs assistance Sitting-balance support: No upper extremity supported Sitting balance-Leahy Scale: Good     Standing balance support: Single extremity supported Standing balance-Leahy Scale: Fair               High level balance activites: Turns;Head turns;Direction changes High Level Balance Comments: unable to multitask walking and head turns, unsteadiness noted during directional changes/turning, and stepping over obstacles            Cognition Arousal/Alertness: Awake/alert Behavior During Therapy: Flat affect Overall Cognitive Status: Impaired/Different from baseline Area of Impairment: Orientation;Safety/judgement;Problem solving;Following commands;Attention;Awareness;Memory                 Orientation Level: Time;Disoriented to;Place;Situation Current Attention Level: Sustained Memory: Decreased short-term memory Following Commands: Follows one step commands inconsistently Safety/Judgement: Decreased awareness of safety;Decreased awareness of deficits Awareness: Intellectual Problem Solving: Slow processing;Difficulty sequencing;Requires verbal cues;Requires tactile cues;Decreased initiation General Comments: Pt with very flat affect, but periodic smiling and tearfulness noted. PT had been with pt for 10 minutes,  PT switched sides of pt during gait and pt looked at  PT like he had never met her, stating "hi, how are you? This girl fixed my walker for me". Pt requires repeated, short verbal cuing for safe mobility, and follows commands ~50% of the time.      Exercises      General Comments        Pertinent Vitals/Pain Pain Assessment: No/denies pain    Home Living                      Prior Function            PT Goals (current goals can now be found in the care plan section) Acute Rehab PT Goals Patient Stated Goal: pt unable to state; sister agreeable to rehab PT Goal Formulation: With patient/family Time For Goal Achievement: 05/24/20 Potential to Achieve Goals: Fair Progress towards PT goals: Progressing toward goals    Frequency    Min 3X/week      PT Plan Current plan remains appropriate    Co-evaluation              AM-PAC PT "6 Clicks" Mobility   Outcome Measure  Help needed turning from your back to your side while in a flat bed without using bedrails?: A Little Help needed moving from lying on your back to sitting on the side of a flat bed without using bedrails?: A Little Help needed moving to and from a bed to a chair (including a wheelchair)?: A Little Help needed standing up from a chair using your arms (e.g., wheelchair or bedside chair)?: A Little Help needed to walk in hospital room?: A Little Help needed climbing 3-5 steps with a railing? : A Lot 6 Click Score: 17    End of Session Equipment Utilized During Treatment: Gait belt Activity Tolerance: Patient tolerated treatment well Patient left: with call bell/phone within reach;in bed;with bed alarm set Nurse Communication: Mobility status PT Visit Diagnosis: Unsteadiness on feet (R26.81);Muscle weakness (generalized) (M62.81)     Time: 3276-1470 PT Time Calculation (min) (ACUTE ONLY): 18 min  Charges:  $Gait Training: 8-22 mins                    Alyviah Crandle E, PT [place] Pager [phone]  Office  [phone]  Roxine Caddy D Elonda Husky 05/16/2020, 4:33 PM

## 2020-05-16 NOTE — Progress Notes (Addendum)
  Speech Language Pathology Treatment: Dysphagia  Patient Details Name: Darren Pearson MRN: [id number] DOB: 12-29-1965 Today's Date: 05/16/2020 Time: 8676-1950 SLP Time Calculation (min) (ACUTE ONLY): 9 min  Assessment / Plan / Recommendation Clinical Impression  Darren Pearson up in chair, smiling and sister present. Reviewed yesterday's MBS results with pt/family and pt states swallowing well past several days. No s/s aspiration with regular texture/thin liquids. Pt's communication has improved as well. He is able to make his needs known in acute setting. He continues to need assist from cognitive standpoint when activity increased at next level of care. ST recommends continued ST at SNF. ST will discharge pt.    HPI HPI: Darren Pearson is a 54 y.o. male with medical history significant of hypertension, hyperlipidemia, CVA with residual left-sided hemiparesis, hepatitis C, anxiety, and depression who presents after his sister found him slouch against the house outside. Over the last couple of days patient had been refusing to eat certain meals throughout the day despite his sister fixing things that he usually likes.  Over the last 3 to 4 weeks he had not been his normal self, was not taking his medications regularly, was not getting out of bed, or taking showers.  Patient has reportedly been lying in bed continuously and came in with bedbugs.  On physical exam patient had some findings suggestive possibly of scabies with rash in between the webs of his feet. MRI did demonstrate small acute infarcts in the basal ganglia, left corona radiata, right occipital white matter, left parietal periventricular white matter, and bilateral frontal white matter. It is felt that this appearance is compatibile emboli.       SLP Plan  All goals met;Discharge SLP treatment due to (comment)       Recommendations  Diet recommendations: Regular;Thin liquid Liquids provided via: Cup;Straw Medication Administration:  Whole meds with puree Supervision: Patient able to self feed;Intermittent supervision to cue for compensatory strategies Compensations: Slow rate;Small sips/bites Postural Changes and/or Swallow Maneuvers: Seated upright 90 degrees                Oral Care Recommendations: Oral care BID SLP Visit Diagnosis: Dysphagia, unspecified (R13.10) Plan: All goals met;Discharge SLP treatment due to (comment)       GO                Darren Pearson 05/16/2020, 2:30 PM

## 2020-05-16 NOTE — Progress Notes (Signed)
PROGRESS NOTE  ARCHIMEDES HAROLD NWG:[id number] DOB: [birth year] DOA: 05/09/2020 PCP: [name], NP  HPI/Recap of past 80 hours: 54 year old male with past medical history of previous CVA in January with residual left-sided weakness who was brought in on 6/16 after being found slumped by the side of his house.  Patient also has a previous history of hypertension, hepatitis C and depression.  Patient was not really responsive when found and his sister had reported in the few days prior, he had been refusing to eat not taking his medicines regularly and not showering or getting out of bed.  It was also reported that he had been having black stools.  In the emergency room, found to have hemoglobin of 6 and neurology consulted.  Stroke work initiated and patient admitted to the hospitalist service.  Patient transfused 2 units packed red blood cells.  Stroke work-up revealed multiple small acute infarcts in multiple parts of the brain on both sides compatible with emboli.  Echocardiogram unrevealing.  Neurology consulted and LP ruled out evidence of vasculitis.  Because of stroke felt to be secondary to anemia.  Patient seen by GI who cleared patient to resume antiplatelet medication recommended endoscopy as outpatient after he is fully recovered from his current stroke.  Patient seen by PT and OT who are recommending skilled nursing after not felt to be appropriate for inpatient rehab.  Assessment/Plan: Principal Problem: Acute blood loss anemia: Currently stable.  Status post 2 units packed red blood transfusion initially and then 2 units on day 2..  Cleared by GI.  Plan is for outpatient endoscopy following recovery from stroke.  Likely source is large amount of NSAID use at home.  Stool guaiacs have been negative here in the emergency room and following admission. Active Problems:   Hepatitis C/elevated ammonia levels: Hepatitis C stable.  Ammonia level only minimally elevated.  Acute  multinfarct CVA (cerebral vascular accident) ([place]): Felt to be secondary to blood loss anemia.  Work-up unrevealing for any type of emboli finding.  Back on aspirin after being cleared by gastroenterology.   Hypokalemia: Replacing as needed   Hyperammonemia ([place])  Essential hypertension: Blood pressure now normotensive.  Currently not on any blood pressure medications.    Elevated troponin: Mildly elevated.  EKG unrevealing.  Seen by cardiology and felt to be secondary to demand ischemia brought on by anemia.  AKI in the setting of stage II chronic kidney disease: Back to baseline.  Creatinine today at 1.21   Code Status: Full code  Family Communication: Left message with sister  Disposition Plan: Awaiting skilled nursing   Consultants:  Neurology  Gastroenterology  Cardiology  Procedures:  Echocardiogram done 6/17 noting preserved ejection fraction.  No evidence of valvular dysfunction  Antimicrobials:  None  DVT prophylaxis: SCDs   Objective: Vitals:   05/15/20 2323 05/16/20 0754  BP: (!) 149/97 140/89  Pulse: 72 68  Resp: 15 18  Temp: 98.3 F (36.8 C) 98.1 F (36.7 C)  SpO2: 98% 100%    Intake/Output Summary (Last 24 hours) at 05/16/2020 1632 Last data filed at 05/16/2020 0400 Gross per 24 hour  Intake --  Output 800 ml  Net -800 ml   There were no vitals filed for this visit. There is no height or weight on file to calculate BMI.  Exam:   General: Alert and oriented x2, no acute distress  HEENT: Normocephalic and atraumatic, mucous membranes moist  Cardiovascular: Regular rate and rhythm, S1-S2  Respiratory: Clear to  auscultation bilaterally  Abdomen: Soft, nontender, nondistended, positive bowel sounds  Musculoskeletal: No clubbing or cyanosis, trace pitting edema  Skin: No skin breaks, tears or lesions  Psychiatry: Appropriate, no evidence of psychoses   Data Reviewed: CBC: Recent Labs  Lab 05/10/20 0410 05/10/20 0410  05/10/20 1744 05/11/20 0231 05/11/20 1715 05/12/20 0403 05/16/20 0240  WBC 9.7  --   --  6.4  --  7.8 6.9  NEUTROABS  --   --   --   --   --  5.4 4.5  HGB 7.0*   < > 6.7* 6.4* 9.1* 9.5* 10.2*  HCT 23.6*   < > 22.6* 21.5* 28.8* 30.0* 32.8*  MCV 78.1*  --   --  78.2*  --  78.7* 80.6  PLT 393  --   --  335  --  303 323   < > = values in this interval not displayed.   Basic Metabolic Panel: Recent Labs  Lab 05/10/20 0410 05/11/20 0231 05/12/20 0403 05/16/20 0240  NA 141 139 139 138  K 3.9 3.7 3.8 4.1  CL 108 108 108 104  CO2 24 25 23 25   GLUCOSE 88 88 87 91  BUN 22* 23* 17 14  CREATININE 1.56* 1.59* 1.28* 1.21  CALCIUM 8.8* 8.1* 8.6* 9.0   GFR: CrCl cannot be calculated (Unknown ideal weight.). Liver Function Tests: No results for input(s): AST, ALT, ALKPHOS, BILITOT, PROT, ALBUMIN in the last 168 hours. No results for input(s): LIPASE, AMYLASE in the last 168 hours. No results for input(s): AMMONIA in the last 168 hours. Coagulation Profile: No results for input(s): INR, PROTIME in the last 168 hours. Cardiac Enzymes: No results for input(s): CKTOTAL, CKMB, CKMBINDEX, TROPONINI in the last 168 hours. BNP (last 3 results) No results for input(s): PROBNP in the last 8760 hours. HbA1C: No results for input(s): HGBA1C in the last 72 hours. CBG: Recent Labs  Lab 05/10/20 0236  GLUCAP 84   Lipid Profile: No results for input(s): CHOL, HDL, LDLCALC, TRIG, CHOLHDL, LDLDIRECT in the last 72 hours. Thyroid Function Tests: No results for input(s): TSH, T4TOTAL, FREET4, T3FREE, THYROIDAB in the last 72 hours. Anemia Panel: No results for input(s): VITAMINB12, FOLATE, FERRITIN, TIBC, IRON, RETICCTPCT in the last 72 hours. Urine analysis:    Component Value Date/Time   COLORURINE YELLOW 05/09/2020 1430   APPEARANCEUR CLEAR 05/09/2020 1430   LABSPEC 1.033 (H) 05/09/2020 1430   PHURINE 6.0 05/09/2020 1430   GLUCOSEU NEGATIVE 05/09/2020 1430   HGBUR MODERATE (A) 05/09/2020  1430   BILIRUBINUR NEGATIVE 05/09/2020 1430   BILIRUBINUR neg 05/30/2014 1538   KETONESUR NEGATIVE 05/09/2020 1430   PROTEINUR NEGATIVE 05/09/2020 1430   UROBILINOGEN >=8.0 05/30/2014 1538   NITRITE NEGATIVE 05/09/2020 1430   LEUKOCYTESUR NEGATIVE 05/09/2020 1430   Sepsis Labs: @LABRCNTIP (procalcitonin:4,lacticidven:4)  ) Recent Results (from the past 240 hour(s))  SARS Coronavirus 2 by RT PCR (hospital order, performed in [place] hospital lab) Nasopharyngeal Nasopharyngeal Swab     Status: None   Collection Time: 05/09/20  2:19 PM   Specimen: Nasopharyngeal Swab  Result Value Ref Range Status   SARS Coronavirus 2 NEGATIVE NEGATIVE Final    Comment: (NOTE) SARS-CoV-2 target nucleic acids are NOT DETECTED.  The SARS-CoV-2 RNA is generally detectable in upper and lower respiratory specimens during the acute phase of infection. The lowest concentration of SARS-CoV-2 viral copies this assay can detect is 250 copies / mL. A negative result does not preclude SARS-CoV-2 infection and should not be  used as the sole basis for treatment or other patient management decisions.  A negative result may occur with improper specimen collection / handling, submission of specimen other than nasopharyngeal swab, presence of viral mutation(s) within the areas targeted by this assay, and inadequate number of viral copies (<250 copies / mL). A negative result must be combined with clinical observations, patient history, and epidemiological information.  Fact Sheet for Patients:   StrictlyIdeas.no  Fact Sheet for Healthcare Providers: BankingDealers.co.za  This test is not yet approved or  cleared by the [place] FDA and has been authorized for detection and/or diagnosis of SARS-CoV-2 by FDA under an Emergency Use Authorization (EUA).  This EUA will remain in effect (meaning this test can be used) for the duration of the COVID-19 declaration  under Section 564(b)(1) of the Act, 21 U.S.C. section 360bbb-3(b)(1), unless the authorization is terminated or revoked sooner.  Performed at [facility], [place] [address], [place] [address]   Culture, blood (routine x 2)     Status: None   Collection Time: 05/09/20  6:30 PM   Specimen: BLOOD  Result Value Ref Range Status   Specimen Description BLOOD SITE NOT SPECIFIED  Final   Special Requests   Final    BOTTLES DRAWN AEROBIC AND ANAEROBIC Blood Culture results may not be optimal due to an inadequate volume of blood received in culture bottles   Culture   Final    NO GROWTH 5 DAYS Performed at [facility], [place] [address], [place], [place] [address]    Report Status 05/14/2020 FINAL  Final  Culture, blood (routine x 2)     Status: None   Collection Time: 05/09/20  6:35 PM   Specimen: BLOOD  Result Value Ref Range Status   Specimen Description BLOOD SITE NOT SPECIFIED  Final   Special Requests   Final    BOTTLES DRAWN AEROBIC AND ANAEROBIC Blood Culture results may not be optimal due to an inadequate volume of blood received in culture bottles   Culture   Final    NO GROWTH 5 DAYS Performed at [facility], [place] [address], [place], [place] [address]    Report Status 05/14/2020 FINAL  Final  Gram stain     Status: None   Collection Time: 05/10/20  7:00 PM   Specimen: CSF; Cerebrospinal Fluid  Result Value Ref Range Status   Specimen Description CSF  Final   Special Requests NONE  Final   Gram Stain   Final    NO WBC SEEN NO ORGANISMS SEEN CYTOSPIN SMEAR Performed at [facility], [place] [address] [place], [place] [address]    Report Status 05/10/2020 FINAL  Final  CSF culture     Status: None   Collection Time: 05/10/20  7:00 PM   Specimen: CSF  Result Value Ref Range Status   Specimen Description CSF  Final   Special Requests ADDED ON AT 2051 BY MD 05/10/2020  Final   Culture   Final    NO GROWTH 3 DAYS Performed at [facility], [place] [address] [place], [place] [address]    Report Status 05/13/2020 FINAL  Final      Studies: No results found.  Scheduled Meds: . sodium chloride   Intravenous Once  . atorvastatin  10 mg Oral Daily  . lidocaine (PF)  5 mL Intradermal Once  . permethrin  1 application Topical Once  . sodium chloride flush  3 mL Intravenous Q12H    Continuous Infusions:   LOS: 7 days     Annita Brod, MD [facility]   05/16/2020, 4:32 PM

## 2020-05-16 NOTE — Progress Notes (Signed)
Occupational Therapy Treatment Patient Details Name: Darren Pearson MRN: [id number] DOB: [birth year] Today's Date: 05/16/2020    History of present illness Pt is 54 yo male who was found slumped by the side of his house and was brought to [facility].  Pt found to have small acute infarcts in the basal ganglia, left corona radiata, right occipital white matter, left parietal periventricular white matter, and bilateral frontal white matter.  Additionally, pt found to have hgb of 6.0 and has received PRBC.  Pt s/p lumbar puncture to evaluate for possible vasculitis, results pending.  Pt with PMH of previous CVA in 11/2019 with residual left sided weakness.   OT comments  Patient presenting flat and minimally verbally responsive.  Focused session on cognition to increase independence with ADLs.  Stood with min assist and RW and walked to sink with min guard.  Patient requiring max cues for initiating, sequencing, and termination.  Patient often repeating one small step of a task many times (ex: wet toothbrush, put it in mouth for a second, wet toothbrush, put in mouth...x5).  Patient still requiring max assist to follow simple commands.  Will continue to follow with OT acutely to address the deficits listed below.    Follow Up Recommendations  SNF;Supervision/Assistance - 24 hour    Equipment Recommendations  3 in 1 bedside commode    Recommendations for Other Services      Precautions / Restrictions Precautions Precautions: Fall Restrictions Weight Bearing Restrictions: No       Mobility Bed Mobility Overal bed mobility: Needs Assistance Bed Mobility: Supine to Sit     Supine to sit: Min assist;HOB elevated        Transfers Overall transfer level: Needs assistance Equipment used: Rolling walker (2 wheeled);None Transfers: Sit to/from Stand Sit to Stand: Min assist              Balance Overall balance assessment: Needs assistance Sitting-balance support: No upper extremity  supported Sitting balance-Leahy Scale: Good     Standing balance support: Single extremity supported Standing balance-Leahy Scale: Fair Standing balance comment: Static balance standing at sink does not require support, though dynamic patient needs RW                           ADL either performed or assessed with clinical judgement   ADL Overall ADL's : Needs assistance/impaired     Grooming: Wash/dry face;Oral care;Min guard;Standing;Cueing for safety;Cueing for sequencing   Upper Body Bathing: Min guard;Cueing for safety;Cueing for sequencing;Standing                           Functional mobility during ADLs: Min guard;Rolling walker General ADL Comments: Session focused mostly on cognition during ADLs     Vision       Perception     Praxis      Cognition Arousal/Alertness: Awake/alert Behavior During Therapy: Flat affect Overall Cognitive Status: Impaired/Different from baseline Area of Impairment: Orientation;Safety/judgement;Problem solving;Following commands;Attention;Awareness;Memory                 Orientation Level: Time;Disoriented to;Place;Situation Current Attention Level: Focused Memory: Decreased short-term memory;Decreased recall of precautions Following Commands: Follows one step commands inconsistently Safety/Judgement: Decreased awareness of safety;Decreased awareness of deficits Awareness: Intellectual Problem Solving: Slow processing;Difficulty sequencing;Requires verbal cues;Requires tactile cues;Decreased initiation General Comments: Patient minimally verbally responsive, answering only "yes", "no", "ok".  Requiring max cueing to initiate, sequence, and terminate  tasks.  Repsame step of task over and over until cued to do next step.          Exercises     Shoulder Instructions       General Comments      Pertinent Vitals/ Pain       Pain Assessment: No/denies pain  Home Living                                           Prior Functioning/Environment              Frequency  Min 2X/week        Progress Toward Goals  OT Goals(current goals can now be found in the care plan section)  Progress towards OT goals: Progressing toward goals  Acute Rehab OT Goals Patient Stated Goal: did not state OT Goal Formulation: With patient Time For Goal Achievement: 05/24/20 Potential to Achieve Goals: [place] Discharge plan remains appropriate    Co-evaluation                 AM-PAC OT "6 Clicks" Daily Activity     Outcome Measure   Help from another person eating meals?: A Little Help from another person taking care of personal grooming?: A Little Help from another person toileting, which includes using toliet, bedpan, or urinal?: A Lot Help from another person bathing (including washing, rinsing, drying)?: A Little Help from another person to put on and taking off regular upper body clothing?: A Little Help from another person to put on and taking off regular lower body clothing?: A Lot 6 Click Score: 16    End of Session Equipment Utilized During Treatment: Gait belt;Rolling walker  OT Visit Diagnosis: Unsteadiness on feet (R26.81);Other abnormalities of gait and mobility (R26.89);Muscle weakness (generalized) (M62.81);Other symptoms and signs involving cognitive function   Activity Tolerance Patient tolerated treatment well   Patient Left in chair;with call bell/phone within reach;with chair alarm set   Nurse Communication Mobility status        Time: 9563-8756 OT Time Calculation (min): 24 min  Charges: OT General Charges $OT Visit: 1 Visit OT Treatments $Self Care/Home Management : 8-22 mins $Cognitive Funtion inital: Initial 15 mins  August Luz, OTR/L    Phylliss Bob 05/16/2020, 1:03 PM

## 2020-05-17 ENCOUNTER — Other Ambulatory Visit: Payer: Self-pay

## 2020-05-17 ENCOUNTER — Ambulatory Visit (INDEPENDENT_AMBULATORY_CARE_PROVIDER_SITE_OTHER): Payer: Self-pay | Admitting: Primary Care

## 2020-05-17 NOTE — TOC Progression Note (Signed)
Transition of Care [facility]) - Progression Note    Patient Details  Name: Darren Pearson MRN: [id number] Date of Birth: Jul 22, 1966  Transition of Care [facility]) CM/SW [place], [place] Phone Number: 05/17/2020, 2:49 PM  Clinical Narrative:    CSW contacted the following SNFs for Medicaid bed availability:   [facility]: spoke with Seth Bake, currently reviewing  [facility]: left a voicemail, awaiting a call back [facility]: left a voicemail, awaiting a call back  Expected Discharge Plan: [place] Barriers to Discharge: Continued Medical Work up, Inadequate or no insurance  Expected Discharge Plan and Services Expected Discharge Plan: [place] arrangements for the past 2 months: Single Family Home                                       Social Determinants of Health (SDOH) Interventions    Readmission Risk Interventions No flowsheet data found.

## 2020-05-17 NOTE — Progress Notes (Signed)
Physical Therapy Treatment Patient Details Name: Darren Pearson MRN: [id number] DOB: 1966/08/24 Today's Date: 05/17/2020    History of Present Illness Pt is 54 yo male who was found slumped by the side of his house and was brought to [facility].  Pt found to have small acute infarcts in the basal ganglia, left corona radiata, right occipital white matter, left parietal periventricular white matter, and bilateral frontal white matter.  Additionally, pt found to have hgb of 6.0 and has received PRBC.  Pt s/p lumbar puncture to evaluate for possible vasculitis, results pending.  Pt with PMH of previous CVA in 11/2019 with residual left sided weakness.    PT Comments    Pt on toilet upon PT arrival to room, requiring cuing to sequence task upon bathroom exit. Pt ambulated hallway distance with use of RW, transitioning to HHA, then no AD. Pt adequately challenged without use of UE support, and required multimodal cuing and min assist to steady. Pt's sister present in room, and is excited about pt progress with mobility and is excited for him to d/c to ST-SNF to return to PLOF. PT to conitnue to follow acutely.    Follow Up Recommendations  SNF     Equipment Recommendations  Rolling walker with 5" wheels    Recommendations for Other Services       Precautions / Restrictions Precautions Precautions: Fall Restrictions Weight Bearing Restrictions: No    Mobility  Bed Mobility Overal bed mobility: Needs Assistance             General bed mobility comments: up to toilet upon PT arrival to room  Transfers Overall transfer level: Needs assistance Equipment used: Rolling walker (2 wheeled) Transfers: Sit to/from Stand Sit to Stand: Min assist         General transfer comment: min assist to steady, verbal cuing for hand placement when rising.  Ambulation/Gait Ambulation/Gait assistance: Min guard;Min assist Gait Distance (Feet): 100 Feet (x2) Assistive device: Rolling walker (2  wheeled);1 person hand held assist;None Gait Pattern/deviations: Step-through pattern;Decreased stride length;Shuffle;Trunk flexed Gait velocity: decr   General Gait Details: min guard when ambulating with use of RW, verbal cuing for upright posture, placement in RW, and looking forwards not downwards. Transitioned to [facility] then ~20 ft no HHA, requiring min assist to steady with verbal cuing for hallway navigation and increasing step length.   Stairs             Wheelchair Mobility    Modified Rankin (Stroke Patients Only) Modified Rankin (Stroke Patients Only) Pre-Morbid Rankin Score: Moderately severe disability Modified Rankin: Moderately severe disability     Balance Overall balance assessment: Needs assistance Sitting-balance support: No upper extremity supported Sitting balance-Leahy Scale: Good     Standing balance support: Single extremity supported Standing balance-Leahy Scale: Fair                 High Level Balance Comments: unable to multitask walking and head turns, turns eyes only when asked to scan environment            Cognition Arousal/Alertness: Awake/alert Behavior During Therapy: Flat affect Overall Cognitive Status: Impaired/Different from baseline Area of Impairment: Orientation;Attention;Memory;Following commands;Safety/judgement;Awareness;Problem solving                 Orientation Level: Disoriented to;Situation Current Attention Level: Sustained Memory: Decreased short-term memory Following Commands: Follows one step commands inconsistently;Follows one step commands with increased time Safety/Judgement: Decreased awareness of safety;Decreased awareness of deficits Awareness: Intellectual Problem Solving: Slow  processing;Difficulty sequencing;Requires verbal cues;Requires tactile cues;Decreased initiation General Comments: Pt continues to present with flat affect, animates with joke-telling. Multimodal cuing for safety during  mobility. difficulty motor planning tasks, for example pt had BM and did not think to flush toilet or wipe self. STM deficits, PT giving verbal mobility cue to be executed and pt unable to demonstrate when asked to do it 5 seconds later.      Exercises General Exercises - Lower Extremity Hip Flexion/Marching: AROM;Both;10 reps;Standing (with RW to steady) Other Exercises Other Exercises: Forward toe taps in standing x10 bilaterally, emphasizing slow and controlled to emphasize SL balance Other Exercises: hip extension in standing bilaterally x10, emphasizing mobility from hips not from knees and slow and controlled motion    General Comments        Pertinent Vitals/Pain Pain Assessment: No/denies pain    Home Living                      Prior Function            PT Goals (current goals can now be found in the care plan section) Acute Rehab PT Goals PT Goal Formulation: With patient/family Time For Goal Achievement: 05/24/20 Potential to Achieve Goals: Fair Progress towards PT goals: Progressing toward goals    Frequency    Min 3X/week      PT Plan Current plan remains appropriate    Co-evaluation              AM-PAC PT "6 Clicks" Mobility   Outcome Measure  Help needed turning from your back to your side while in a flat bed without using bedrails?: A Little Help needed moving from lying on your back to sitting on the side of a flat bed without using bedrails?: A Little Help needed moving to and from a bed to a chair (including a wheelchair)?: A Little Help needed standing up from a chair using your arms (e.g., wheelchair or bedside chair)?: A Little Help needed to walk in hospital room?: A Little Help needed climbing 3-5 steps with a railing? : A Lot 6 Click Score: 17    End of Session Equipment Utilized During Treatment: Gait belt Activity Tolerance: Patient tolerated treatment well Patient left: with call bell/phone within reach;in bed;with bed  alarm set Nurse Communication: Mobility status PT Visit Diagnosis: Unsteadiness on feet (R26.81);Muscle weakness (generalized) (M62.81)     Time: 5374-8270 PT Time Calculation (min) (ACUTE ONLY): 16 min  Charges:  $Gait Training: 8-22 mins                    Marguetta Windish E, PT [place] Pager [phone]  Office [phone]   Silvanna Ohmer D Elonda Husky 05/17/2020, 4:12 PM

## 2020-05-17 NOTE — Progress Notes (Signed)
PROGRESS NOTE  Darren Pearson FSF:[id number] DOB: 08/13/1966 DOA: 05/09/2020 PCP: [name], NP  HPI/Recap of past 50 hours: 54 year old male with past medical history of previous CVA in January with residual left-sided weakness who was brought in on 6/16 after being found slumped by the side of his house.  Patient also has a previous history of hypertension, hepatitis C and depression.  Patient was not really responsive when found and his sister had reported in the few days prior, he had been refusing to eat not taking his medicines regularly and not showering or getting out of bed.  It was also reported that he had been having black stools.  In the emergency room, found to have hemoglobin of 6 and neurology consulted.  Stroke work initiated and patient admitted to the hospitalist service.  Patient transfused 2 units packed red blood cells.  Stroke work-up revealed multiple small acute infarcts in multiple parts of the brain on both sides compatible with emboli.  Echocardiogram unrevealing.  Neurology consulted and LP ruled out evidence of vasculitis.  Because of stroke felt to be secondary to anemia.  Patient seen by GI who cleared patient to resume antiplatelet medication recommended endoscopy as outpatient after he is fully recovered from his current stroke.  Patient seen by PT and OT who are recommending skilled nursing after not felt to be appropriate for inpatient rehab.  Patient doing okay.  No complaints.  Denies any pain or trouble breathing.  Assessment/Plan: Principal Problem: Acute blood loss anemia: Currently stable.  Status post 2 units packed red blood transfusion initially and then 2 units on day 2..  Cleared by GI.  Plan is for outpatient endoscopy following recovery from stroke.  Likely source is large amount of NSAID use at home.  Stool guaiacs have been negative here in the emergency room and following admission.  Hemoglobin on 6/23 at 10.2. Active Problems:    Hepatitis C/elevated ammonia levels: Hepatitis C stable.  Ammonia level only minimally elevated.  Acute multinfarct CVA (cerebral vascular accident) ([place]): Felt to be secondary to blood loss anemia.  Work-up unrevealing for any type of emboli finding.  Back on aspirin after being cleared by gastroenterology.    Hypokalemia: Replacing as needed  Essential hypertension: Blood pressure now normotensive.  Currently not on any blood pressure medications.    Elevated troponin: Mildly elevated.  EKG unrevealing.  Seen by cardiology and felt to be secondary to demand ischemia brought on by anemia.  AKI in the setting of stage II chronic kidney disease: Back to baseline.  Creatinine on 6/23 at 1.21   Code Status: Full code  Family Communication: Left message with sister  Disposition Plan: Awaiting skilled nursing   Consultants:  Neurology  Gastroenterology  Cardiology  Procedures:  Echocardiogram done 6/17 noting preserved ejection fraction.  No evidence of valvular dysfunction  Antimicrobials:  None  DVT prophylaxis: SCDs   Objective: Vitals:   05/16/20 2100 05/17/20 0752  BP: 140/78 (!) 150/92  Pulse: 72 81  Resp: 17 19  Temp: 98.5 F (36.9 C) 98 F (36.7 C)  SpO2: 99% 100%   No intake or output data in the 24 hours ending 05/17/20 1246 There were no vitals filed for this visit. There is no height or weight on file to calculate BMI.  Exam:   General: Alert and oriented x2, no acute distress  HEENT: Normocephalic and atraumatic, mucous membranes moist  Cardiovascular: Regular rate and rhythm, S1-S2  Respiratory: Clear to auscultation bilaterally  Abdomen: Soft, nontender, nondistended, positive bowel sounds  Musculoskeletal: No clubbing or cyanosis, trace pitting edema  Skin: No skin breaks, tears or lesions  Psychiatry: Appropriate, no evidence of psychoses   Data Reviewed: CBC: Recent Labs  Lab 05/10/20 1744 05/11/20 0231 05/11/20 1715  05/12/20 0403 05/16/20 0240  WBC  --  6.4  --  7.8 6.9  NEUTROABS  --   --   --  5.4 4.5  HGB 6.7* 6.4* 9.1* 9.5* 10.2*  HCT 22.6* 21.5* 28.8* 30.0* 32.8*  MCV  --  78.2*  --  78.7* 80.6  PLT  --  335  --  303 027   Basic Metabolic Panel: Recent Labs  Lab 05/11/20 0231 05/12/20 0403 05/16/20 0240  NA 139 139 138  K 3.7 3.8 4.1  CL 108 108 104  CO2 25 23 25   GLUCOSE 88 87 91  BUN 23* 17 14  CREATININE 1.59* 1.28* 1.21  CALCIUM 8.1* 8.6* 9.0   GFR: CrCl cannot be calculated (Unknown ideal weight.). Liver Function Tests: No results for input(s): AST, ALT, ALKPHOS, BILITOT, PROT, ALBUMIN in the last 168 hours. No results for input(s): LIPASE, AMYLASE in the last 168 hours. No results for input(s): AMMONIA in the last 168 hours. Coagulation Profile: No results for input(s): INR, PROTIME in the last 168 hours. Cardiac Enzymes: No results for input(s): CKTOTAL, CKMB, CKMBINDEX, TROPONINI in the last 168 hours. BNP (last 3 results) No results for input(s): PROBNP in the last 8760 hours. HbA1C: No results for input(s): HGBA1C in the last 72 hours. CBG: No results for input(s): GLUCAP in the last 168 hours. Lipid Profile: No results for input(s): CHOL, HDL, LDLCALC, TRIG, CHOLHDL, LDLDIRECT in the last 72 hours. Thyroid Function Tests: No results for input(s): TSH, T4TOTAL, FREET4, T3FREE, THYROIDAB in the last 72 hours. Anemia Panel: No results for input(s): VITAMINB12, FOLATE, FERRITIN, TIBC, IRON, RETICCTPCT in the last 72 hours. Urine analysis:    Component Value Date/Time   COLORURINE YELLOW 05/09/2020 1430   APPEARANCEUR CLEAR 05/09/2020 1430   LABSPEC 1.033 (H) 05/09/2020 1430   PHURINE 6.0 05/09/2020 1430   GLUCOSEU NEGATIVE 05/09/2020 1430   HGBUR MODERATE (A) 05/09/2020 1430   BILIRUBINUR NEGATIVE 05/09/2020 1430   BILIRUBINUR neg 05/30/2014 1538   KETONESUR NEGATIVE 05/09/2020 1430   PROTEINUR NEGATIVE 05/09/2020 1430   UROBILINOGEN >=8.0 05/30/2014 1538     NITRITE NEGATIVE 05/09/2020 1430   LEUKOCYTESUR NEGATIVE 05/09/2020 1430   Sepsis Labs: @LABRCNTIP (procalcitonin:4,lacticidven:4)  ) Recent Results (from the past 240 hour(s))  SARS Coronavirus 2 by RT PCR (hospital order, performed in [place] hospital lab) Nasopharyngeal Nasopharyngeal Swab     Status: None   Collection Time: 05/09/20  2:19 PM   Specimen: Nasopharyngeal Swab  Result Value Ref Range Status   SARS Coronavirus 2 NEGATIVE NEGATIVE Final    Comment: (NOTE) SARS-CoV-2 target nucleic acids are NOT DETECTED.  The SARS-CoV-2 RNA is generally detectable in upper and lower respiratory specimens during the acute phase of infection. The lowest concentration of SARS-CoV-2 viral copies this assay can detect is 250 copies / mL. A negative result does not preclude SARS-CoV-2 infection and should not be used as the sole basis for treatment or other patient management decisions.  A negative result may occur with improper specimen collection / handling, submission of specimen other than nasopharyngeal swab, presence of viral mutation(s) within the areas targeted by this assay, and inadequate number of viral copies (<250 copies / mL). A negative result must  be combined with clinical observations, patient history, and epidemiological information.  Fact Sheet for Patients:   StrictlyIdeas.no  Fact Sheet for Healthcare Providers: BankingDealers.co.za  This test is not yet approved or  cleared by the [place] FDA and has been authorized for detection and/or diagnosis of SARS-CoV-2 by FDA under an Emergency Use Authorization (EUA).  This EUA will remain in effect (meaning this test can be used) for the duration of the COVID-19 declaration under Section 564(b)(1) of the Act, 21 U.S.C. section 360bbb-3(b)(1), unless the authorization is terminated or revoked sooner.  Performed at [facility], [place] [address]  [place], [place] [address]   Culture, blood (routine x 2)     Status: None   Collection Time: 05/09/20  6:30 PM   Specimen: BLOOD  Result Value Ref Range Status   Specimen Description BLOOD SITE NOT SPECIFIED  Final   Special Requests   Final    BOTTLES DRAWN AEROBIC AND ANAEROBIC Blood Culture results may not be optimal due to an inadequate volume of blood received in culture bottles   Culture   Final    NO GROWTH 5 DAYS Performed at [facility], [place] [address], [place], [place] [address]    Report Status 05/14/2020 FINAL  Final  Culture, blood (routine x 2)     Status: None   Collection Time: 05/09/20  6:35 PM   Specimen: BLOOD  Result Value Ref Range Status   Specimen Description BLOOD SITE NOT SPECIFIED  Final   Special Requests   Final    BOTTLES DRAWN AEROBIC AND ANAEROBIC Blood Culture results may not be optimal due to an inadequate volume of blood received in culture bottles   Culture   Final    NO GROWTH 5 DAYS Performed at [facility], [place] [address], [place], [place] [address]    Report Status 05/14/2020 FINAL  Final  Gram stain     Status: None   Collection Time: 05/10/20  7:00 PM   Specimen: CSF; Cerebrospinal Fluid  Result Value Ref Range Status   Specimen Description CSF  Final   Special Requests NONE  Final   Gram Stain   Final    NO WBC SEEN NO ORGANISMS SEEN CYTOSPIN SMEAR Performed at [facility], [place] [address], [place], [place] [address]    Report Status 05/10/2020 FINAL  Final  CSF culture     Status: None   Collection Time: 05/10/20  7:00 PM   Specimen: CSF  Result Value Ref Range Status   Specimen Description CSF  Final   Special Requests ADDED ON AT 2051 BY MD 05/10/2020  Final   Culture   Final    NO GROWTH 3 DAYS Performed at [facility], [place] [address] [place], [place] [address]    Report Status 05/13/2020 FINAL  Final      Studies: No results found.  Scheduled Meds: . sodium chloride   Intravenous Once  .  atorvastatin  10 mg Oral Daily  . lidocaine (PF)  5 mL Intradermal Once  . permethrin  1 application Topical Once  . sodium chloride flush  3 mL Intravenous Q12H    Continuous Infusions:   LOS: 8 days     Annita Brod, MD [facility]   05/17/2020, 12:46 PM

## 2020-05-17 NOTE — TOC Progression Note (Signed)
Transition of Care [facility]) - Progression Note    Patient Details  Name: Darren Pearson MRN: [id number] Date of Birth: 02-11-1966  Transition of Care [facility]) CM/SW [place], [place] Phone Number: 05/17/2020, 2:25 PM  Clinical Narrative:     CSW resent bed requests now that pt has medicaid. Spoke with pt sister bedside to update on process.   CSW contacted the following directly with cell phone;  [facility] unable to offer [facility] unable to offer [facility] unable to offer  Expected Discharge Plan: [place] Barriers to Discharge: Continued Medical Work up, Inadequate or no insurance  Expected Discharge Plan and Services Expected Discharge Plan: [place] arrangements for the past 2 months: Single Family Home                                       Social Determinants of Health (SDOH) Interventions    Readmission Risk Interventions No flowsheet data found.

## 2020-05-17 NOTE — TOC Progression Note (Signed)
Transition of Care [facility]) - Progression Note    Patient Details  Name: Darren Pearson MRN: [id number] Date of Birth: 1966/08/14  Transition of Care [facility]) CM/SW [place],  Phone Number: 05/17/2020, 12:46 PM  Clinical Narrative:     Received passr  [id number]  Expected Discharge Plan: [place] Barriers to Discharge: Continued Medical Work up, Inadequate or no insurance  Expected Discharge Plan and Services Expected Discharge Plan: [place] arrangements for the past 2 months: Single Family Home                                       Social Determinants of Health (SDOH) Interventions    Readmission Risk Interventions No flowsheet data found.

## 2020-05-18 MED ORDER — VITAMIN B-12 1000 MCG PO TABS
1000.0000 ug | ORAL_TABLET | Freq: Every day | ORAL | Status: DC
  Administered 2020-05-18 – 2020-05-25 (×8): 1000 ug via ORAL
  Filled 2020-05-18 (×8): qty 1

## 2020-05-18 MED ORDER — ATORVASTATIN CALCIUM 40 MG PO TABS
40.0000 mg | ORAL_TABLET | Freq: Every day | ORAL | Status: DC
  Administered 2020-05-18 – 2020-05-24 (×7): 40 mg via ORAL
  Filled 2020-05-18 (×7): qty 1

## 2020-05-18 MED ORDER — AMLODIPINE BESYLATE 10 MG PO TABS
10.0000 mg | ORAL_TABLET | Freq: Every day | ORAL | Status: DC
  Administered 2020-05-18 – 2020-05-25 (×8): 10 mg via ORAL
  Filled 2020-05-18 (×8): qty 1

## 2020-05-18 NOTE — TOC Progression Note (Signed)
Transition of Care [facility]) - Progression Note    Patient Details  Name: SUNDANCE MOISE MRN: [id number] Date of Birth: Jun 22, 1966  Transition of Care [facility]) CM/SW [place], [place] Phone Number: 05/18/2020, 3:56 PM  Clinical Narrative:     CSW spoke to pt and sister at bedside. CSW explained to pt and sister that there are currently no bed offers. CSW explained that not all facilities take medicaid pts and if they do its at low numbers. CSW also explained that the pt not receiving disability is a barrier to placement. Sister stated they have applied for disability and will reach back out to her case worker Monday.   CSW encouraged sister to think about a plan B. sister states that pt would return to her home but she would be unable to provide 24 hour care. Sister states they have no other family in the area and cant afford to hire help.   CSW will continue to follow.   Expected Discharge Plan: [place] Barriers to Discharge: Continued Medical Work up, Inadequate or no insurance  Expected Discharge Plan and Services Expected Discharge Plan: [place] arrangements for the past 2 months: Single Family Home                                       Social Determinants of Health (SDOH) Interventions    Readmission Risk Interventions No flowsheet data found.  Emeterio Reeve, Latanya Presser, [place] Social Worker [phone]

## 2020-05-18 NOTE — Progress Notes (Signed)
PROGRESS NOTE  Darren Pearson KVQ:[id number] DOB: March 08, 1966 DOA: 05/09/2020 PCP: [name], NP  Brief History   The patient is a 54 yr old man who was found slumped by the side of his house. 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 He also carries history of hypertension, hyperlipidemia, hepatitis C, anxiety, depression. When he was found he was drooling and not responding. His sister mentioned that in the last 2-3 days the patient was changed. He had been refusing to eat, not taking his medications regularly, and not showering or getting out of bed. She reported that he did complain of having black stools. He has been using ibuprofen.   In the ED the patient was found to have hemoglobin of 6.0. He has been transfused with one unit of PRBC's. The patient's aspirin was held, and GI was consulted. FOBT was negative.  The patient has been admitted to a telemetry bed.   Neurology was consulted. A stroke work-up was initiated.   Although CT head demonstrated only chronic infarcts, MRI did demonstrate small acute infarcts in the basal ganglia, left corona radiata, right occipital white matter, left parietal periventricular white matter, and bilateral frontal white matter. It is felt that this appearance is compatibile emboli. There was also advanced chronic small vessel ischemia.   CTA head and neck was also negative for acute ischemia or infarction, and no intracranial large vessel occlusion or flow limiting stenosis. Carotid arteries are patent. There is moderate stenosis of the proximal right vertebral artery. There is a 16mm left paraclinoid internal carotid artery aneurysm that is unchanged from prior CTA.   Echocardiogram has been completed and demonstrates LVED of 50-55% with abnormal septal motion and low normal function. Diastolic parameters are normal. RV systolic function and size is normal.   Neurology has been  consulted as well and the patient has been evaluated by Dr. Erlinda Hong. He has recommended an LP as he feels that patient may be suffering from a cerebral vasculitis resulting in the patient's odd behavior and mental status changes in the last couple of weeks. It was done this morning. LP failed to demonstrate evidence of autoimmune process or vasculitis. It is felt that stroke was a result of the patient's anemia.   Gastroenterology has evaluated the patient. They have cleared the patient to resume antiplatelet medication. They state that he should have endoscopy as outpatient after he is recovered from current stroke.   The patient has been transfused with a two more units of PRBC's.         SLP has evaluated the patient. He has been cleared for a regular diet with thin liquids. CIR has evaluated this patient and determined that he is not appropriate for CIR. Will continue to seek SNF placement.  Consultants  . Neurology . Gastroenterology . Cardiology  Procedures  . None  Antibiotics   Anti-infectives (From admission, onward)   None     Subjective  The patient is awake and alert.  No new complaints.   Objective   Vitals:  Vitals:   05/17/20 2245 05/18/20 0733  BP: (!) 150/78 (!) 154/95  Pulse: 70 87  Resp: 16 19  Temp: 98 F (36.7 C) 98.3 F (36.8 C)  SpO2: 99% 100%   Exam:  Constitutional:  . The patient is awake, alert, and oriented x 2. No acute distress. Respiratory:  . No increased work of breathing. . No wheezes, rales, or rhonchi .  No tactile fremitus Cardiovascular:  . Regular rate and rhythm . No murmurs, ectopy, or gallups. . No lateral PMI. No thrills. Abdomen:  . Abdomen is soft, non-tender, non-distended . No hernias, masses, or organomegaly . Normoactive bowel sounds.  Musculoskeletal:  . No cyanosis, clubbing, or edema Skin:  . No rashes, lesions, ulcers . palpation of skin: no induration or nodules Neurologic:  . Unable to evaluate due to  echocardiogram in progress. Psychiatric:  . Mental status o Mood, affect appropriate o Orientation to person, and date/time  . The patient remains confused.   I have personally reviewed the following:   Today's Data  . Orthoptist  . Blood cultures x 2 (05/09/2020): No growth  Imaging  . CTA head and neck with perfusion study . MRI Brain  Cardiology Data  . Echocardiogram is pending. . EKG  Scheduled Meds: . sodium chloride   Intravenous Once  . atorvastatin  10 mg Oral Daily  . lidocaine (PF)  5 mL Intradermal Once  . permethrin  1 application Topical Once  . sodium chloride flush  3 mL Intravenous Q12H   Continuous Infusions:  Principal Problem:   CVA (cerebral vascular accident) ([place]) Active Problems:   Hepatitis C   Hypokalemia   Hyperammonemia (H[facility])   Episode of unresponsiveness   Symptomatic anemia   History of CVA (cerebrovascular accident)   Elevated troponin   AKI (acute kidney injury) ([place])   CKD (chronic kidney disease), stage II   LOS: 9 days   A & P  CVA: Acute. The patient was admitted to a telemetry bed. Neurology was consulted and stroke work-up was initiated. Although CT head demonstrated only chronic infarcts, MRI did demonstrate small acute infarcts in the basal ganglia, left corona radiata, right occipital white matter, left parietal periventricular white matter, and bilateral frontal white matter. It was initially felt that this appearance is compatibile with an embolic phenomenon.There was also advanced chronic small vessel ischemia. However, now neurology feels that this CVA was due to low hemoglobin. Lumbar puncture was negative for signs of autoimmune disease or vasculitis. Initially ASA was held due to the patient's anemia with Hgb of 6.0 and complaint of melena. GI has now stated that antithrombotics may be restarted, but I have continued to hold them due to the patient's dropping hemoglobin. The patient is awaiting SNF  placement.  Symptomatic anemia: Hemoglobin 6 g/dL on admission. Patient reported having blood in stools at home in the 2 weeks prior to presentation. Stool guaiacs however were noted to be negative both in ED and on the floor the following morning. The patient has received 1 unit of PRBC's in transfusion. Anemia is likely this is due to acute blood loss in the setting of chronic anemia.The patient apparently had been using NSAIDS at home. GI stated on 05/10/2020 that they feel it is safe to resume the patient's antithrombotics.   Patient was typed and screened and ordered to be transfused 1 unit of packed red blood cells due to his presenting hemoglobin of 6.0. . After this transfusion, his hemoglobin again dropped to 6.4. He received 2 more units at that time. Continue to monitor hemoglobin. ASA has now been stopped again. Hemoglobin now appears stable at 10.2. Continue to monitor.   Elevated troponin: Acute. Troponin mildly elevated at 41.  EKG similar to previous, but with faster rate.  Suspect secondary to demand in setting of anemia.  They have continued to trend up. Will consult Cardiology and continue  to follow troponins. Troponins were felt to be elevated due to demand ischemia and anemia in the setting of AKI.  Dysphagia: SLP has cleared the patient for regular diet with thin liquids.   Acute kidney injury: Patient presents with creatinine elevated up to 2.47 with BUN 43. Creatinine is down to 1.21 on the morning of 12/17/2019. CK was negative. Continue to monitor creatinine,  previously noted to be relatively within normal limits at 1.19 in 2020.    Hypokalemia: Resolved. Potassium 3.3 on admission. 4.1 the morning of 05/16/2020. Monitor and replace as necessary.  Hypomagnesemia: Supplement magnesium and monitor.   Bedbugs: Patient has reportedly been lying in bed continuously and came in with bedbugs.  On physical exam patient had some findings suggestive possibly of scabies with rash in  between the webs of his feet. Contact precautions are in place and the patient is being treated with permethrin cream.   History of stroke with residual weakness: Patient with previous stroke in December 2020 with acute/subacute bilateral cortical infarction of left postcentral gyrus/corona radiata in addition to punctate cortical infarct of the right parietal lobe; also noted to have remote infarcts and evidence of likely chronic microvascular ischemia.  Patient had negative hypercoagulable work-up at that time. Loop recorder was placed at that time. The device has been interrogated by [employer]. They have reported to me that there is no evidence of atrial fibrillation on the device.   Systolic congestive heart failure: Patient appears to be hypovolemic at this time. Chest x-ray otherwise noted to be clear.  Last EF noted to be 45 to 50% by echocardiogram in 10/2019. Repeat echocardiogram has demonstrated abnormal septal motion with EF of 50-55% with low normal function and no regional wall motion abnormalities. Diastolic parameters were normal. RV systolic function and size are normal.   Essential hypertension: Blood pressures are now a little high without antihypertensive meds. Norvasc has been restarted. Monitor.   Elevated ammonia/History of hepatitis C: Ammonia level was just mildly elevated at 36, but liver enzymes noted to be typically within normal limits. Clinically, no signs of HE this morning.   Prolonged QT interval: QTc initially noted to be elevated at 511. Resolved on repeat EKG on 05/10/2020.  I have seen and examined this patient myself. I have spent 32 minutes in his evaluation and care.   DVT prophylaxis: scds   Code Status: Full Family Communication: None available Disposition Plan: SNF.  Alilah Mcmeans, DO [facility] Direct contact: see [email or web address]  7PM-7AM contact night coverage as above 05/18/2020, 3:09 PM  LOS: 1 day

## 2020-05-19 LAB — COMPREHENSIVE METABOLIC PANEL
ALT: 14 U/L (ref 0–44)
AST: 16 U/L (ref 15–41)
Albumin: 3.1 g/dL — ABNORMAL LOW (ref 3.5–5.0)
Alkaline Phosphatase: 70 U/L (ref 38–126)
Anion gap: 10 (ref 5–15)
BUN: 15 mg/dL (ref 6–20)
CO2: 22 mmol/L (ref 22–32)
Calcium: 9.1 mg/dL (ref 8.9–10.3)
Chloride: 106 mmol/L (ref 98–111)
Creatinine, Ser: 1.19 mg/dL (ref 0.61–1.24)
GFR calc Af Amer: >60 mL/min (ref >60)
GFR calc non Af Amer: >60 mL/min (ref >60)
Glucose, Bld: 87 mg/dL (ref 70–99)
Potassium: 4.2 mmol/L (ref 3.5–5.1)
Sodium: 138 mmol/L (ref 135–145)
Total Bilirubin: 0.6 mg/dL (ref 0.3–1.2)
Total Protein: 7.5 g/dL (ref 6.5–8.1)

## 2020-05-19 LAB — CBC WITH DIFFERENTIAL/PLATELET
Abs Immature Granulocytes: 0.01 10*3/uL (ref 0.00–0.07)
Basophils Absolute: 0.1 10*3/uL (ref 0.0–0.1)
Basophils Relative: 1 %
Eosinophils Absolute: 0.4 10*3/uL (ref 0.0–0.5)
Eosinophils Relative: 6 %
HCT: 34.3 % — ABNORMAL LOW (ref 39.0–52.0)
Hemoglobin: 10.8 g/dL — ABNORMAL LOW (ref 13.0–17.0)
Immature Granulocytes: 0 %
Lymphocytes Relative: 23 %
Lymphs Abs: 1.4 10*3/uL (ref 0.7–4.0)
MCH: 25.6 pg — ABNORMAL LOW (ref 26.0–34.0)
MCHC: 31.5 g/dL (ref 30.0–36.0)
MCV: 81.3 fL (ref 80.0–100.0)
Monocytes Absolute: 0.4 10*3/uL (ref 0.1–1.0)
Monocytes Relative: 7 %
Neutro Abs: 4 10*3/uL (ref 1.7–7.7)
Neutrophils Relative %: 63 %
Platelets: 302 10*3/uL (ref 150–400)
RBC: 4.22 MIL/uL (ref 4.22–5.81)
RDW: 19.7 % — ABNORMAL HIGH (ref 11.5–15.5)
WBC: 6.2 10*3/uL (ref 4.0–10.5)
nRBC: 0 % (ref 0.0–0.2)

## 2020-05-19 NOTE — Progress Notes (Signed)
PROGRESS NOTE  ISSAIH KAUS CHE:[id number] DOB: 1966-06-13 DOA: 05/09/2020 PCP: [name], NP  Brief History   The patient is a 54 yr old man who was found slumped by the side of his house. 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 He also carries history of hypertension, hyperlipidemia, hepatitis C, anxiety, depression. When he was found he was drooling and not responding. His sister mentioned that in the last 2-3 days the patient was changed. He had been refusing to eat, not taking his medications regularly, and not showering or getting out of bed. She reported that he did complain of having black stools. He has been using ibuprofen.   In the ED the patient was found to have hemoglobin of 6.0. He has been transfused with one unit of PRBC's. The patient's aspirin was held, and GI was consulted. FOBT was negative.  The patient has been admitted to a telemetry bed.   Neurology was consulted. A stroke work-up was initiated.   Although CT head demonstrated only chronic infarcts, MRI did demonstrate small acute infarcts in the basal ganglia, left corona radiata, right occipital white matter, left parietal periventricular white matter, and bilateral frontal white matter. It is felt that this appearance is compatibile emboli. There was also advanced chronic small vessel ischemia.   CTA head and neck was also negative for acute ischemia or infarction, and no intracranial large vessel occlusion or flow limiting stenosis. Carotid arteries are patent. There is moderate stenosis of the proximal right vertebral artery. There is a 12mm left paraclinoid internal carotid artery aneurysm that is unchanged from prior CTA.   Echocardiogram has been completed and demonstrates LVED of 50-55% with abnormal septal motion and low normal function. Diastolic parameters are normal. RV systolic function and size is normal.   Neurology has been  consulted as well and the patient has been evaluated by Dr. Erlinda Hong. He has recommended an LP as he feels that patient may be suffering from a cerebral vasculitis resulting in the patient's odd behavior and mental status changes in the last couple of weeks. It was done this morning. LP failed to demonstrate evidence of autoimmune process or vasculitis. It is felt that stroke was a result of the patient's anemia.   Gastroenterology has evaluated the patient. They have cleared the patient to resume antiplatelet medication. They state that he should have endoscopy as outpatient after he is recovered from current stroke.   The patient has been transfused with a two more units of PRBC's.         SLP has evaluated the patient. He has been cleared for a regular diet with thin liquids. CIR has evaluated this patient and determined that he is not appropriate for CIR. Will continue to seek SNF placement.  Consultants  . Neurology . Gastroenterology . Cardiology  Procedures  . None  Antibiotics   Anti-infectives (From admission, onward)   None     Subjective  The patient is awake and alert.  No new complaints.   Objective   Vitals:  Vitals:   05/18/20 2124 05/19/20 0746  BP: (!) 152/72 (!) 154/95  Pulse: 75 75  Resp: 17 18  Temp: 98.3 F (36.8 C) 98.2 F (36.8 C)  SpO2: 100% 100%   Exam:  Constitutional:  . The patient is awake, alert, and oriented x 2. No acute distress. Respiratory:  . No increased work of breathing. . No wheezes, rales, or rhonchi .  No tactile fremitus Cardiovascular:  . Regular rate and rhythm . No murmurs, ectopy, or gallups. . No lateral PMI. No thrills. Abdomen:  . Abdomen is soft, non-tender, non-distended . No hernias, masses, or organomegaly . Normoactive bowel sounds.  Musculoskeletal:  . No cyanosis, clubbing, or edema Skin:  . No rashes, lesions, ulcers . palpation of skin: no induration or nodules Neurologic:  . Unable to evaluate due to  echocardiogram in progress. Psychiatric:  . Mental status o Mood, affect appropriate o Orientation to person, and date/time  . The patient remains confused.   I have personally reviewed the following:   Today's Data  . Vitals, CMP, CBC  Micro Data  . Blood cultures x 2 (05/09/2020): No growth  Imaging  . CTA head and neck with perfusion study . MRI Brain  Cardiology Data  . Echocardiogram is pending. . EKG  Scheduled Meds: . sodium chloride   Intravenous Once  . amLODipine  10 mg Oral Daily  . atorvastatin  40 mg Oral q1800  . lidocaine (PF)  5 mL Intradermal Once  . permethrin  1 application Topical Once  . sodium chloride flush  3 mL Intravenous Q12H  . cyanocobalamin  1,000 mcg Oral Daily   Continuous Infusions:  Principal Problem:   CVA (cerebral vascular accident) ([place]) Active Problems:   Hepatitis C   Hypokalemia   Hyperammonemia (HCC)   Episode of unresponsiveness   Symptomatic anemia   History of CVA (cerebrovascular accident)   Elevated troponin   AKI (acute kidney injury) ([place])   CKD (chronic kidney disease), stage II   LOS: 10 days   A & P  CVA: Acute. The patient was admitted to a telemetry bed. Neurology was consulted and stroke work-up was initiated. Although CT head demonstrated only chronic infarcts, MRI did demonstrate small acute infarcts in the basal ganglia, left corona radiata, right occipital white matter, left parietal periventricular white matter, and bilateral frontal white matter. It was initially felt that this appearance is compatibile with an embolic phenomenon.There was also advanced chronic small vessel ischemia. However, now neurology feels that this CVA was due to low hemoglobin. Lumbar puncture was negative for signs of autoimmune disease or vasculitis. Initially ASA was held due to the patient's anemia with Hgb of 6.0 and complaint of melena. GI has now stated that antithrombotics may be restarted, but I have continued to hold them  due to the patient's dropping hemoglobin. The patient is awaiting SNF placement.  Symptomatic anemia: Hemoglobin 6 g/dL on admission. Patient reported having blood in stools at home in the 2 weeks prior to presentation. Stool guaiacs however were noted to be negative both in ED and on the floor the following morning. The patient has received 1 unit of PRBC's in transfusion. Anemia is likely this is due to acute blood loss in the setting of chronic anemia.The patient apparently had been using NSAIDS at home. GI stated on 05/10/2020 that they feel it is safe to resume the patient's antithrombotics.   Patient was typed and screened and ordered to be transfused 1 unit of packed red blood cells due to his presenting hemoglobin of 6.0. . After this transfusion, his hemoglobin again dropped to 6.4. He received 2 more units at that time. Continue to monitor hemoglobin. ASA has now been stopped again. Hemoglobin now appears stable at 10.2. Continue to monitor.   Elevated troponin: Acute. Troponin mildly elevated at 41.  EKG similar to previous, but with faster rate.  Suspect secondary  to demand in setting of anemia.  They have continued to trend up. Will consult Cardiology and continue to follow troponins. Troponins were felt to be elevated due to demand ischemia and anemia in the setting of AKI.  Dysphagia: SLP has cleared the patient for regular diet with thin liquids.   Acute kidney injury: Patient presents with creatinine elevated up to 2.47 with BUN 43. Creatinine is down to 1.21 on the morning of 12/17/2019. CK was negative. Continue to monitor creatinine,  previously noted to be relatively within normal limits at 1.19 in 2020.    Hypokalemia: Resolved. Potassium 3.3 on admission. 4.1 the morning of 05/16/2020. Monitor and replace as necessary.  Hypomagnesemia: Supplement magnesium and monitor.   Bedbugs: Patient has reportedly been lying in bed continuously and came in with bedbugs.  On physical exam  patient had some findings suggestive possibly of scabies with rash in between the webs of his feet. Contact precautions are in place and the patient is being treated with permethrin cream.   History of stroke with residual weakness: Patient with previous stroke in December 2020 with acute/subacute bilateral cortical infarction of left postcentral gyrus/corona radiata in addition to punctate cortical infarct of the right parietal lobe; also noted to have remote infarcts and evidence of likely chronic microvascular ischemia.  Patient had negative hypercoagulable work-up at that time. Loop recorder was placed at that time. The device has been interrogated by [employer]. They have reported to me that there is no evidence of atrial fibrillation on the device.   Systolic congestive heart failure: Patient appears to be hypovolemic at this time. Chest x-ray otherwise noted to be clear.  Last EF noted to be 45 to 50% by echocardiogram in 10/2019. Repeat echocardiogram has demonstrated abnormal septal motion with EF of 50-55% with low normal function and no regional wall motion abnormalities. Diastolic parameters were normal. RV systolic function and size are normal.   Essential hypertension: Blood pressures are now a little high without antihypertensive meds. Norvasc has been restarted. Monitor.   Elevated ammonia/History of hepatitis C: Ammonia level was just mildly elevated at 36, but liver enzymes noted to be typically within normal limits. Clinically, no signs of HE this morning.   Prolonged QT interval: QTc initially noted to be elevated at 511. Resolved on repeat EKG on 05/10/2020.  Ambulatory dysfunction: Pt needs SNF placement.  I have seen and examined this patient myself. I have spent 32 minutes in his evaluation and care.   DVT prophylaxis: scds   Code Status: Full Family Communication: None available Disposition Plan: SNF.  Sarha Bartelt, DO [facility] Direct contact: see [email or web address]    7PM-7AM contact night coverage as above 05/19/2020, 3:04 PM  LOS: 1 day

## 2020-05-20 NOTE — Progress Notes (Signed)
PROGRESS NOTE  Darren Pearson WEX:[id number] DOB: 03-05-1966 DOA: 05/09/2020 PCP: [name], NP  Brief History   The patient is a 54 yr old man who was found slumped by the side of his house. 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 He also carries history of hypertension, hyperlipidemia, hepatitis C, anxiety, depression. When he was found he was drooling and not responding. His sister mentioned that in the last 2-3 days the patient was changed. He had been refusing to eat, not taking his medications regularly, and not showering or getting out of bed. She reported that he did complain of having black stools. He has been using ibuprofen.   In the ED the patient was found to have hemoglobin of 6.0. He has been transfused with one unit of PRBC's. The patient's aspirin was held, and GI was consulted. FOBT was negative.  The patient has been admitted to a telemetry bed.   Neurology was consulted. A stroke work-up was initiated.   Although CT head demonstrated only chronic infarcts, MRI did demonstrate small acute infarcts in the basal ganglia, left corona radiata, right occipital white matter, left parietal periventricular white matter, and bilateral frontal white matter. It is felt that this appearance is compatibile emboli. There was also advanced chronic small vessel ischemia.   CTA head and neck was also negative for acute ischemia or infarction, and no intracranial large vessel occlusion or flow limiting stenosis. Carotid arteries are patent. There is moderate stenosis of the proximal right vertebral artery. There is a 110mm left paraclinoid internal carotid artery aneurysm that is unchanged from prior CTA.   Echocardiogram has been completed and demonstrates LVED of 50-55% with abnormal septal motion and low normal function. Diastolic parameters are normal. RV systolic function and size is normal.   Neurology has been  consulted as well and the patient has been evaluated by Dr. Erlinda Hong. He has recommended an LP as he feels that patient may be suffering from a cerebral vasculitis resulting in the patient's odd behavior and mental status changes in the last couple of weeks. It was done this morning. LP failed to demonstrate evidence of autoimmune process or vasculitis. It is felt that stroke was a result of the patient's anemia.   Gastroenterology has evaluated the patient. They have cleared the patient to resume antiplatelet medication. They state that he should have endoscopy as outpatient after he is recovered from current stroke.   The patient has been transfused with a two more units of PRBC's.         SLP has evaluated the patient. He has been cleared for a regular diet with thin liquids. CIR has evaluated this patient and determined that he is not appropriate for CIR. Will continue to seek SNF placement.  Consultants  . Neurology . Gastroenterology . Cardiology  Procedures  . None  Antibiotics   Anti-infectives (From admission, onward)   None     Subjective  The patient is awake and alert.  No new complaints.   Objective   Vitals:  Vitals:   05/20/20 0500 05/20/20 0830  BP: (!) 156/93 133/89  Pulse: 77 81  Resp: 16 17  Temp: 98.2 F (36.8 C) 98.5 F (36.9 C)  SpO2: 100% 99%   Exam:  Constitutional:  . The patient is awake, alert, and oriented x 2. No acute distress. Respiratory:  . No increased work of breathing. . No wheezes, rales, or rhonchi .  No tactile fremitus Cardiovascular:  . Regular rate and rhythm . No murmurs, ectopy, or gallups. . No lateral PMI. No thrills. Abdomen:  . Abdomen is soft, non-tender, non-distended . No hernias, masses, or organomegaly . Normoactive bowel sounds.  Musculoskeletal:  . No cyanosis, clubbing, or edema Skin:  . No rashes, lesions, ulcers . palpation of skin: no induration or nodules Neurologic:  . Unable to evaluate due to  echocardiogram in progress. Psychiatric:  . Mental status o Mood, affect appropriate o Orientation to person, and date/time  . The patient remains confused.   I have personally reviewed the following:   Today's Data  . Vitals, CMP, CBC  Micro Data  . Blood cultures x 2 (05/09/2020): No growth  Imaging  . CTA head and neck with perfusion study . MRI Brain  Cardiology Data   . EKG  Scheduled Meds: . sodium chloride   Intravenous Once  . amLODipine  10 mg Oral Daily  . atorvastatin  40 mg Oral q1800  . lidocaine (PF)  5 mL Intradermal Once  . permethrin  1 application Topical Once  . sodium chloride flush  3 mL Intravenous Q12H  . cyanocobalamin  1,000 mcg Oral Daily   Continuous Infusions:  Principal Problem:   CVA (cerebral vascular accident) ([place]) Active Problems:   Hepatitis C   Hypokalemia   Hyperammonemia ([facility])   Episode of unresponsiveness   Symptomatic anemia   History of CVA (cerebrovascular accident)   Elevated troponin   AKI (acute kidney injury) ([place])   CKD (chronic kidney disease), stage II   LOS: 11 days   A & P  CVA: Acute. The patient was admitted to a telemetry bed. Neurology was consulted and stroke work-up was initiated. Although CT head demonstrated only chronic infarcts, MRI did demonstrate small acute infarcts in the basal ganglia, left corona radiata, right occipital white matter, left parietal periventricular white matter, and bilateral frontal white matter. It was initially felt that this appearance is compatibile with an embolic phenomenon.There was also advanced chronic small vessel ischemia. However, now neurology feels that this CVA was due to low hemoglobin. Lumbar puncture was negative for signs of autoimmune disease or vasculitis. Initially ASA was held due to the patient's anemia with Hgb of 6.0 and complaint of melena. GI has now stated that antithrombotics may be restarted, but I have continued to hold them due to the patient's dropping  hemoglobin. The patient is awaiting SNF placement.  Symptomatic anemia: Hemoglobin 6 g/dL on admission. Patient reported having blood in stools at home in the 2 weeks prior to presentation. Stool guaiacs however were noted to be negative both in ED and on the floor the following morning. The patient has received 1 unit of PRBC's in transfusion. Anemia is likely this is due to acute blood loss in the setting of chronic anemia.The patient apparently had been using NSAIDS at home. GI stated on 05/10/2020 that they feel it is safe to resume the patient's antithrombotics.   Patient was typed and screened and ordered to be transfused 1 unit of packed red blood cells due to his presenting hemoglobin of 6.0. . After this transfusion, his hemoglobin again dropped to 6.4. He received 2 more units at that time. Continue to monitor hemoglobin. ASA has now been stopped again. Hemoglobin now appears stable at 10.8. Continue to monitor.   Elevated troponin: Acute. Troponin mildly elevated at 41.  EKG similar to previous, but with faster rate.  Suspect secondary to demand in  setting of anemia.  They have continued to trend up. Will consult Cardiology and continue to follow troponins. Troponins were felt to be elevated due to demand ischemia and anemia in the setting of AKI.  Dysphagia: SLP has cleared the patient for regular diet with thin liquids.   Acute kidney injury: Patient presents with creatinine elevated up to 2.47 with BUN 43. Creatinine is down to 1.21 on the morning of 12/17/2019. CK was negative. Continue to monitor creatinine,  previously noted to be relatively within normal limits at 1.19 in 2020.    Hypokalemia: Resolved. Potassium 3.3 on admission. 4.2  the morning of 05/16/2020. Monitor and replace as necessary.  Hypomagnesemia: Supplement magnesium and monitor.   Bedbugs: Patient has reportedly been lying in bed continuously and came in with bedbugs.  On physical exam patient had some findings  suggestive possibly of scabies with rash in between the webs of his feet. Contact precautions are in place and the patient is being treated with permethrin cream.   History of stroke with residual weakness: Patient with previous stroke in December 2020 with acute/subacute bilateral cortical infarction of left postcentral gyrus/corona radiata in addition to punctate cortical infarct of the right parietal lobe; also noted to have remote infarcts and evidence of likely chronic microvascular ischemia.  Patient had negative hypercoagulable work-up at that time. Loop recorder was placed at that time. The device has been interrogated by [employer]. They have reported to me that there is no evidence of atrial fibrillation on the device.   Systolic congestive heart failure: Patient appears to be hypovolemic at this time. Chest x-ray otherwise noted to be clear.  Last EF noted to be 45 to 50% by echocardiogram in 10/2019. Repeat echocardiogram has demonstrated abnormal septal motion with EF of 50-55% with low normal function and no regional wall motion abnormalities. Diastolic parameters were normal. RV systolic function and size are normal.   Essential hypertension: Blood pressures are now a little high without antihypertensive meds. Norvasc has been restarted. Monitor.   Elevated ammonia/History of hepatitis C: Ammonia level was just mildly elevated at 36, but liver enzymes noted to be typically within normal limits. Clinically, no signs of HE this morning.   Prolonged QT interval: QTc initially noted to be elevated at 511. Resolved on repeat EKG on 05/10/2020.  Ambulatory dysfunction: Pt needs SNF placement.  I have seen and examined this patient myself. I have spent 30 minutes in his evaluation and care.   DVT prophylaxis: scds   Code Status: Full Family Communication: None available Disposition Plan: SNF.  Aedon Deason, DO [facility] Direct contact: see [email or web address]  7PM-7AM contact night  coverage as above 05/20/2020, 3:43 PM  LOS: 1 day

## 2020-05-20 NOTE — Progress Notes (Signed)
PROGRESS NOTE    Darren Pearson  VXB:[id number]  DOB: 06/25/1966  PCP: [name], NP [place] date:05/09/2020 Chief compliant: Loss of consciousness, found slumped over 54 y.o. male with medical history significant of hypertension, hyperlipidemia, CVA with residual left-sided hemiparesis, hepatitis C, anxiety, and depression presents with poor oral intake x 3 days, and after his sister found him slouched against the house outside.  She was unaware of how he got outside, noted to be drooling and not responding. Over the last 3 to 4 weeks he had not been his normal self, was not taking his medications regularly, was not getting out of bed, or taking showers. He did report hematochezia/NSAID use in the ED. ED Course: Brought in as a code stroke and seen by neurology.  Initial CT brain wnl.  Afebrile, pulse 88-116, respiration 14-29. Labs revealed hemoglobin 6 with low MCV and MCH, platelets 491, potassium 3.3, BUN 23, and creatinine 2.47.  Urinalysis was positive for moderate blood, rare bacteria,  0-5 RBCs, 0-5 WBCs.  CTA of the head and neck negative for any acute abnormalities.  UDS was negative.  Stool guaiacs were negative. Hospital course: Patient was typed and screen ordered to be given 1 unit of packed red blood cells-> admitted to [facility] for stroke and anemia w/u.MRI did demonstrate small acute infarcts consistent with embolic phenomenon. CTA showed moderate stenosis of the proximal right vertebral artery. There is a 71mm left paraclinoid internal carotid artery aneurysm that is unchanged from prior CTA. LP failed to demonstrate evidence of autoimmune process or vasculitis. It is felt that stroke was a result of the patient's anemia. Gastroenterology has evaluated the patient->should have endoscopy as outpatient after he is recovered from current stroke-Pt has been transfused 2 more units of PRBC's. CIR has evaluated this patient and determined that he is not appropriate for CIR. Will continue to  seek SNF placement.  Subjective: Patient sitting in bedside chair.  Denies any acute complaints.  He is oriented to place and person.  Could not tell me the year or month.  Objective: Vitals:   05/20/20 0500 05/20/20 0830 05/20/20 2028 05/21/20 0030  BP: (!) 156/93 133/89 (!) 136/94 (!) 141/87  Pulse: 77 81 83 78  Resp: 16 17 16 16   Temp: 98.2 F (36.8 C) 98.5 F (36.9 C) 99 F (37.2 C) 98.2 F (36.8 C)  TempSrc: Oral Oral Oral Oral  SpO2: 100% 99% 100% 99%    Intake/Output Summary (Last 24 hours) at 05/21/2020 0751 Last data filed at 05/20/2020 2200 Gross per 24 hour  Intake 358 ml  Output 800 ml  Net -442 ml   There were no vitals filed for this visit.  Physical Examination  General: Moderately built, no acute distress noted Head ENT: Atraumatic normocephalic, PERRLA, neck supple Heart: S1-S2 heard, regular rate and rhythm, no murmurs.  No leg edema noted Lungs: Equal air entry bilaterally, no rhonchi or rales on exam, no accessory muscle use Abdomen: Bowel sounds heard, soft, nontender, nondistended. No organomegaly.  No CVA tenderness Extremities: No pedal edema.  No cyanosis or clubbing. Neurological: Awake alert oriented x2, right-sided mild weakness (3/5) compared to left (4/5 from old CVA) and right-sided past-pointing on finger-to-nose test,  sensations to crude touch intact, gait not tested but per last PT note patient did have ataxia/scissoring gait on their evaluation Skin: No wounds or rashes.  Data Reviewed: I have personally reviewed following labs and imaging studies  CBC: Recent Labs  Lab 05/16/20 0240 05/19/20  0444  WBC 6.9 6.2  NEUTROABS 4.5 4.0  HGB 10.2* 10.8*  HCT 32.8* 34.3*  MCV 80.6 81.3  PLT 323 785   Basic Metabolic Panel: Recent Labs  Lab 05/16/20 0240 05/19/20 0444  NA 138 138  K 4.1 4.2  CL 104 106  CO2 25 22  GLUCOSE 91 87  BUN 14 15  CREATININE 1.21 1.19  CALCIUM 9.0 9.1   GFR: CrCl cannot be calculated (Unknown ideal  weight.). Liver Function Tests: Recent Labs  Lab 05/19/20 0444  AST 16  ALT 14  ALKPHOS 70  BILITOT 0.6  PROT 7.5  ALBUMIN 3.1*   No results for input(s): LIPASE, AMYLASE in the last 168 hours. No results for input(s): AMMONIA in the last 168 hours. Coagulation Profile: No results for input(s): INR, PROTIME in the last 168 hours. Cardiac Enzymes: No results for input(s): CKTOTAL, CKMB, CKMBINDEX, TROPONINI in the last 168 hours. BNP (last 3 results) No results for input(s): PROBNP in the last 8760 hours. HbA1C: No results for input(s): HGBA1C in the last 72 hours. CBG: No results for input(s): GLUCAP in the last 168 hours. Lipid Profile: No results for input(s): CHOL, HDL, LDLCALC, TRIG, CHOLHDL, LDLDIRECT in the last 72 hours. Thyroid Function Tests: No results for input(s): TSH, T4TOTAL, FREET4, T3FREE, THYROIDAB in the last 72 hours. Anemia Panel: No results for input(s): VITAMINB12, FOLATE, FERRITIN, TIBC, IRON, RETICCTPCT in the last 72 hours. Sepsis Labs: No results for input(s): PROCALCITON, LATICACIDVEN in the last 168 hours.  No results found for this or any previous visit (from the past 240 hour(s)).    Radiology Studies: No results found.    Scheduled Meds: . sodium chloride   Intravenous Once  . amLODipine  10 mg Oral Daily  . atorvastatin  40 mg Oral q1800  . lidocaine (PF)  5 mL Intradermal Once  . permethrin  1 application Topical Once  . sodium chloride flush  3 mL Intravenous Q12H  . cyanocobalamin  1,000 mcg Oral Daily   Continuous Infusions:     Assessment/Plan:  1. Acute CVA : Embolic vs ischemic.MRI did demonstrate small acute infarcts in the basal ganglia, left corona radiata, right occipital white matter, left parietal periventricular white matter, and bilateral frontal white matter. It was initially felt that this appearance is compatibile with an embolic phenomenon.There was also advanced chronic small vessel ischemia. However, now  neurology feels that this CVA was due to low hemoglobin. Lumbar puncture was negative for signs of autoimmune disease or vasculitis.Dysphagia: SLP has cleared the patient for regular diet with thin liquids. Initially ASA was held due to the patient's anemia with Hgb of 6.0 and complaint of melena. GI has now stated that antithrombotics may be restarted--remained on hold over the weekend in concern for unstable Hgb although now holding up >10. Will restart EC ASA at 81mg . Lipid profile okay on home dose of statins (lipitor 40mg )  2. Symptomatic anemia: Felt likely due to acute blood loss in the setting of chronic anemia.FOBT neg in ED and on medical floor.The patient apparently had been using NSAIDS at home. GI stated on 05/10/2020 that they feel it is safe to resume the patient's antithrombotics.  He is s/p I unit PRBC in ED->hemoglobin again dropped to 6.4. He received 2 more units at that time. Continue to monitor hemoglobin. ASA stopped again. Hemoglobin now appears stable at 10.5. Monitor on baby asa.  3. Acute kidney injury: Patient presented with creatinine up to 2.47->down to 1.21. CK  was negative. Continue to monitor creatinine,  previously noted to be relatively within normal limits at 1.19 in 2020.  4.Hypokalemia/Hypomagnesemia: Resolved.Potassium 3.3 on admission. Monitor and replace as necessary.  5. Prolonged QT interval: QTcinitially noted to be elevated at 511. Resolved on repeat EKG on 05/10/2020 after replacement of potassium/magnesium.  6. Systolic congestive heart failure: Patient appears to be hypovolemic this time. Chest x-ray clear. Last EF noted to be 45 to 50% by echocardiogram in 10/2019. Repeat echocardiogram-abnormal septal motion with EF of 50-55%.  7. Elevated ammonia/History of hepatitis C: Ammonia level was just mildly elevated at 36, but liver enzymes noted to be typically within normal limits. Clinical picture not consistent with hepatic encephalopathy.  8. Essential  HTN: Norvasc resumed. Monitor   7. Bedbugs: Patient has reportedly been lying in bed continuouslyand came in with bedbugs. On physical exam patient had some findings suggestive possibly of scabies with rash in between the webs of his feet-treated with permethrin cream.  10.Ambulatory dysfunction: Patient has left hemiparesis from old CVA and now also appears to have right-sided weakness, ataxia-seen by physical therapy and recommending SNF placement.      DVT prophylaxis: SCDs Code Status: Full code Family / Patient Communication:  Disposition Plan:  SNF Status is: Inpatient  Remains inpatient appropriate because:Unsafe d/c plan   Dispo: The patient is from: Home              Anticipated d/c is to: SNF              Anticipated d/c date is: 2 days              Patient currently is medically stable to d/c.   Time spent: 25 minutes    >50% time spent in discussions with care team and coordination of care.    Guilford Shi, MD [facility] Pager in [place]  If 7PM-7AM, please contact night-coverage [email or web address] 05/21/2020, 7:51 AM

## 2020-05-21 LAB — HEMOGLOBIN AND HEMATOCRIT, BLOOD
HCT: 40.7 % (ref 39.0–52.0)
Hemoglobin: 12.5 g/dL — ABNORMAL LOW (ref 13.0–17.0)

## 2020-05-21 MED ORDER — ASPIRIN EC 81 MG PO TBEC
81.0000 mg | DELAYED_RELEASE_TABLET | Freq: Every day | ORAL | Status: DC
  Administered 2020-05-21 – 2020-05-25 (×5): 81 mg via ORAL
  Filled 2020-05-21 (×5): qty 1

## 2020-05-21 NOTE — Progress Notes (Signed)
Physical Therapy Treatment Patient Details Name: Darren Pearson MRN: [id number] DOB: Jul 15, 1966 Today's Date: 05/21/2020    History of Present Illness Pt is 54 yo male who was found slumped by the side of his house and was brought to [facility].  Pt found to have small acute infarcts in the basal ganglia, left corona radiata, right occipital white matter, left parietal periventricular white matter, and bilateral frontal white matter.  Additionally, pt found to have hgb of 6.0 and has received PRBC.  Pt s/p lumbar puncture to evaluate for possible vasculitis, results pending.  Pt with PMH of previous CVA in 11/2019 with residual left sided weakness.    PT Comments    Pt noted to be increasingly unsteady in standing today, demonstrating x3 LOB requiring PT intervention to correct. Pt ambulated hallway distance with use of RW, but requires multimodal cuing to safely navigate hallway with use of RW. Pt with limited command following during gait, suspect related to attention and processing. PT with concerns about pt safety awareness, I.e. PT asked pt what he would do if he was home alone and there was a fire, pt states "I don't know" after several seconds of thinking. Pt tired post-gait training, tolerated sit to stands for LE strengthening and coordination only. PT continuing to recommend SNF level of care post-acutely.    Follow Up Recommendations  SNF     Equipment Recommendations  Rolling walker with 5" wheels    Recommendations for Other Services       Precautions / Restrictions Precautions Precautions: Fall Restrictions Weight Bearing Restrictions: No    Mobility  Bed Mobility Overal bed mobility: Needs Assistance Bed Mobility: Supine to Sit     Supine to sit: Min assist;HOB elevated     General bed mobility comments: min assist for trunk elevation, increased time and effort to perform.  Transfers Overall transfer level: Needs assistance Equipment used: Rolling walker (2  wheeled) Transfers: Sit to/from Stand Sit to Stand: Min assist         General transfer comment: min assist to steady, correct posterior LOB upon initial standing.  Ambulation/Gait Ambulation/Gait assistance: Min assist Gait Distance (Feet): 110 Feet [phone]) Assistive device: Rolling walker (2 wheeled) Gait Pattern/deviations: Step-through pattern;Decreased stride length;Shuffle;Trunk flexed;Narrow base of support Gait velocity: decr   General Gait Details: min assist to steady, correct x3 LOB posteriorly and laterally (both L and R at different instances). Verbal cuing for upright posture, placement in RW, taking larger steps as pt assumes shuffling, narrow BOS steps. Required AD this day, trialled 5 ft with HHA only and pt very unsteady.   Stairs             Wheelchair Mobility    Modified Rankin (Stroke Patients Only) Modified Rankin (Stroke Patients Only) Pre-Morbid Rankin Score: Moderately severe disability Modified Rankin: Moderately severe disability     Balance Overall balance assessment: Needs assistance Sitting-balance support: No upper extremity supported Sitting balance-Leahy Scale: Good     Standing balance support: Single extremity supported Standing balance-Leahy Scale: Poor Standing balance comment: multiple LOB               High Level Balance Comments: unable to navigate around obstacles when cued to, suspect due to impaired processing            Cognition Arousal/Alertness: Awake/alert Behavior During Therapy: Flat affect Overall Cognitive Status: Impaired/Different from baseline Area of Impairment: Attention;Memory;Following commands;Safety/judgement;Awareness;Problem solving  Current Attention Level: Sustained Memory: Decreased short-term memory Following Commands: Follows one step commands inconsistently;Follows one step commands with increased time Safety/Judgement: Decreased awareness of  safety;Decreased awareness of deficits Awareness: Intellectual Problem Solving: Slow processing;Difficulty sequencing;Requires verbal cues;Requires tactile cues;Decreased initiation General Comments: Pt demonstrates significant difficulty with command following this day, short commands like "go around that object" pt does not process and execute without max multimodal cuing. Pt lacks safety awareness, bumps into objects in hallway even when PT calls attention to them.      Exercises General Exercises - Lower Extremity Mini-Sqauts: AROM;Both;5 reps (sit to stands from recliner, emphasis on hand placement when rising/sitting)    General Comments        Pertinent Vitals/Pain Pain Assessment: No/denies pain    Home Living                      Prior Function            PT Goals (current goals can now be found in the care plan section) Acute Rehab PT Goals Patient Stated Goal: pt unable to state; sister agreeable to rehab PT Goal Formulation: With patient/family Time For Goal Achievement: 05/24/20 Potential to Achieve Goals: Fair Progress towards PT goals: Progressing toward goals    Frequency    Min 3X/week      PT Plan Current plan remains appropriate    Co-evaluation              AM-PAC PT "6 Clicks" Mobility   Outcome Measure  Help needed turning from your back to your side while in a flat bed without using bedrails?: A Little Help needed moving from lying on your back to sitting on the side of a flat bed without using bedrails?: A Little Help needed moving to and from a bed to a chair (including a wheelchair)?: A Little Help needed standing up from a chair using your arms (e.g., wheelchair or bedside chair)?: A Little Help needed to walk in hospital room?: A Little Help needed climbing 3-5 steps with a railing? : A Lot 6 Click Score: 17    End of Session Equipment Utilized During Treatment: Gait belt Activity Tolerance: Patient tolerated treatment  well Patient left: with call bell/phone within reach;in chair;with chair alarm set Nurse Communication: Mobility status PT Visit Diagnosis: Unsteadiness on feet (R26.81);Muscle weakness (generalized) (M62.81)     Time: 8921-1941 PT Time Calculation (min) (ACUTE ONLY): 19 min  Charges:  $Gait Training: 8-22 mins                    Craven Crean E, PT [place] Pager [phone]  Office [phone]  Roxine Caddy D Elonda Husky 05/21/2020, 12:45 PM

## 2020-05-22 NOTE — Plan of Care (Signed)

## 2020-05-22 NOTE — Progress Notes (Signed)
Rehab Admissions Coordinator Note:  Patient was screened by Cleatrice Burke for appropriateness for an Inpatient Acute Rehab Consult per change in PT recommendations. Noted sister can not provide 24/7 supervision. Noted patient not taking care of himself over past few weeks pta. He will need 24/7 supervision that he does not have. I concur with previous Admissions Coordinator, Gretchen Short,  on  6/20 recommending SNF rehab. Please call me with any questions.  Cleatrice Burke RN MSN 05/22/2020, 4:41 PM  I can be reached at [phone].

## 2020-05-22 NOTE — Progress Notes (Signed)
PROGRESS NOTE    Darren Pearson  LDJ:[id number]  DOB: Apr 10, 1966  PCP: [name], NP [place] date:05/09/2020 Chief compliant: Loss of consciousness, found slumped over 54 y.o. male with medical history significant of hypertension, hyperlipidemia, CVA with residual left-sided hemiparesis, hepatitis C, anxiety, and depression presents with poor oral intake x 3 days, and after his sister found him slouched against the house outside.  She was unaware of how he got outside, noted to be drooling and not responding. Over the last 3 to 4 weeks he had not been his normal self, was not taking his medications regularly, was not getting out of bed, or taking showers. He did report hematochezia/NSAID use in the ED. ED Course: Brought in as a code stroke and seen by neurology.  Initial CT brain wnl.  Afebrile, pulse 88-116, respiration 14-29. Labs revealed hemoglobin 6 with low MCV and MCH, platelets 491, potassium 3.3, BUN 23, and creatinine 2.47.  Urinalysis was positive for moderate blood, rare bacteria,  0-5 RBCs, 0-5 WBCs.  CTA of the head and neck negative for any acute abnormalities.  UDS was negative.  Stool guaiacs were negative. Hospital course: Patient was typed and screen ordered to be given 1 unit of packed red blood cells-> admitted to [facility] for stroke and anemia w/u.MRI did demonstrate small acute infarcts consistent with embolic phenomenon. CTA showed moderate stenosis of the proximal right vertebral artery. There is a 14mm left paraclinoid internal carotid artery aneurysm that is unchanged from prior CTA. LP failed to demonstrate evidence of autoimmune process or vasculitis. It is felt that stroke was a result of the patient's anemia. Gastroenterology has evaluated the patient->should have endoscopy as outpatient after he is recovered from current stroke-Pt has been transfused 2 more units of PRBC's. CIR has evaluated this patient and determined that he is not appropriate for CIR. Will continue to  seek SNF placement.  Subjective: Patient ambulated with PT today with minimal assistance.  Noted to have some ataxia and holding onto the wall at times.  Denies any acute complaints. Objective: Vitals:   05/21/20 1433 05/21/20 2320 05/22/20 0718 05/22/20 1542  BP: 132/88 133/79 127/84 130/89  Pulse: 85 84 84 79  Resp:  16 16 16   Temp: 98.4 F (36.9 C) 98.6 F (37 C) 97.9 F (36.6 C) 98.8 F (37.1 C)  TempSrc:  Oral  Oral  SpO2: 100% 98% 100% 100%    Intake/Output Summary (Last 24 hours) at 05/22/2020 1711 Last data filed at 05/22/2020 0323 Gross per 24 hour  Intake --  Output 600 ml  Net -600 ml   There were no vitals filed for this visit.  Physical Examination  General: Moderately built, no acute distress noted Head ENT: Atraumatic normocephalic, PERRLA, neck supple Heart: S1-S2 heard, regular rate and rhythm, no murmurs.  No leg edema noted Lungs: Equal air entry bilaterally, no rhonchi or rales on exam, no accessory muscle use Abdomen: Bowel sounds heard, soft, nontender, nondistended. No organomegaly.  No CVA tenderness Extremities: No pedal edema.  No cyanosis or clubbing. Neurological: Awake alert oriented x2, left sided weakness (4/5 from old CVA) and right-sided past-pointing on finger-to-nose test,  sensations to crude touch intact,  ataxia/scissoring gait during PT evaluation Skin: No wounds or rashes.  Data Reviewed: I have personally reviewed following labs and imaging studies  CBC: Recent Labs  Lab 05/16/20 0240 05/19/20 0444 05/21/20 0944  WBC 6.9 6.2  --   NEUTROABS 4.5 4.0  --   HGB 10.2*  10.8* 12.5*  HCT 32.8* 34.3* 40.7  MCV 80.6 81.3  --   PLT 323 302  --    Basic Metabolic Panel: Recent Labs  Lab 05/16/20 0240 05/19/20 0444  NA 138 138  K 4.1 4.2  CL 104 106  CO2 25 22  GLUCOSE 91 87  BUN 14 15  CREATININE 1.21 1.19  CALCIUM 9.0 9.1   GFR: CrCl cannot be calculated (Unknown ideal weight.). Liver Function Tests: Recent Labs  Lab  05/19/20 0444  AST 16  ALT 14  ALKPHOS 70  BILITOT 0.6  PROT 7.5  ALBUMIN 3.1*   No results for input(s): LIPASE, AMYLASE in the last 168 hours. No results for input(s): AMMONIA in the last 168 hours. Coagulation Profile: No results for input(s): INR, PROTIME in the last 168 hours. Cardiac Enzymes: No results for input(s): CKTOTAL, CKMB, CKMBINDEX, TROPONINI in the last 168 hours. BNP (last 3 results) No results for input(s): PROBNP in the last 8760 hours. HbA1C: No results for input(s): HGBA1C in the last 72 hours. CBG: No results for input(s): GLUCAP in the last 168 hours. Lipid Profile: No results for input(s): CHOL, HDL, LDLCALC, TRIG, CHOLHDL, LDLDIRECT in the last 72 hours. Thyroid Function Tests: No results for input(s): TSH, T4TOTAL, FREET4, T3FREE, THYROIDAB in the last 72 hours. Anemia Panel: No results for input(s): VITAMINB12, FOLATE, FERRITIN, TIBC, IRON, RETICCTPCT in the last 72 hours. Sepsis Labs: No results for input(s): PROCALCITON, LATICACIDVEN in the last 168 hours.  No results found for this or any previous visit (from the past 240 hour(s)).    Radiology Studies: No results found.    Scheduled Meds: . sodium chloride   Intravenous Once  . amLODipine  10 mg Oral Daily  . aspirin EC  81 mg Oral Daily  . atorvastatin  40 mg Oral q1800  . lidocaine (PF)  5 mL Intradermal Once  . permethrin  1 application Topical Once  . sodium chloride flush  3 mL Intravenous Q12H  . cyanocobalamin  1,000 mcg Oral Daily   Continuous Infusions:     Assessment/Plan:  1. Acute CVA : Embolic vs ischemic.MRI did demonstrate small acute infarcts in the basal ganglia, left corona radiata, right occipital white matter, left parietal periventricular white matter, and bilateral frontal white matter. It was initially felt that this appearance is compatibile with an embolic phenomenon.There was also advanced chronic small vessel ischemia. However, now neurology feels that  this CVA was due to low hemoglobin. Lumbar puncture was negative for signs of autoimmune disease or vasculitis.Dysphagia: SLP has cleared the patient for regular diet with thin liquids. Initially ASA was held due to the patient's anemia with Hgb of 6.0 and complaint of melena. GI has now stated that antithrombotics may be restarted--remained on hold over the weekend in concern for unstable Hgb although now holding up >10.  Resumed EC ASA at 81mg  on 6/28. Lipid profile okay on home dose of statins (lipitor 40mg )  2. Symptomatic anemia: Felt likely due to acute blood loss in the setting of chronic anemia.FOBT neg in ED and on medical floor.The patient apparently had been using NSAIDS at home. GI stated on 05/10/2020 that they feel it is safe to resume the patient's antithrombotics.  He is s/p I unit PRBC in ED->hemoglobin again dropped to 6.4. He received 2 more units at that time. Continue to monitor hemoglobin. ASA stopped again. Hemoglobin now appears stable ~10.5-> improved to 12 on labs today. Monitor on baby asa.  3. Acute  kidney injury: Patient presented with creatinine up to 2.47->down to 1.1 which appears to be his baseline.  CK was negative.   4.Hypokalemia/Hypomagnesemia: Resolved.Potassium 3.3 on admission. Monitor and replace as necessary.  5. Prolonged QT interval: QTcinitially noted to be elevated at 511. Resolved on repeat EKG on 05/10/2020 after replacement of potassium/magnesium.  6. Systolic congestive heart failure: Patient appears to be hypovolemic this time. Chest x-ray clear. Last EF noted to be 45 to 50% by echocardiogram in 10/2019. Repeat echocardiogram-abnormal septal motion with EF of 50-55%.  7. Elevated ammonia/History of hepatitis C: Ammonia level was just mildly elevated at 36, but liver enzymes noted to be typically within normal limits. Clinical picture not consistent with hepatic encephalopathy.  8. Essential HTN: Norvasc resumed. Monitor   7. Bedbugs: Patient has  reportedly been lying in bed continuouslyand came in with bedbugs. On physical exam patient had some findings suggestive possibly of scabies with rash in between the webs of his feet-treated with permethrin cream.  10.Ambulatory dysfunction: Patient has left hemiparesis from old CVA and now also appears to have right-sided weakness, ataxia-seen by physical therapy and recommending SNF placement.      DVT prophylaxis: SCDs Code Status: Full code Family / Patient Communication:  Disposition Plan:  Status is: Inpatient  Remains inpatient appropriate because:Unsafe d/c plan   Dispo: The patient is from: Home              Anticipated d/c is to:  SNF rehab, declined for CIR               Anticipated d/c date is: 2 days              Patient currently is medically stable to d/c.   Time spent: 15 minutes    >50% time spent in discussions with care team and coordination of care.    Guilford Shi, MD [facility] Pager in [place]  If 7PM-7AM, please contact night-coverage [email or web address] 05/21/2020, 7:51 AM

## 2020-05-22 NOTE — Progress Notes (Signed)
Occupational Therapy Treatment Patient Details Name: Darren Pearson MRN: [id number] DOB: 10/01/1966 Today's Date: 05/22/2020    History of present illness Pt is 54 yo male who was found slumped by the side of his house and was brought to [facility].  Pt found to have small acute infarcts in the basal ganglia, left corona radiata, right occipital white matter, left parietal periventricular white matter, and bilateral frontal white matter.  Additionally, pt found to have hgb of 6.0 and has received PRBC.  Pt s/p lumbar puncture to evaluate for possible vasculitis, results pending.  Pt with PMH of previous CVA in 11/2019 with residual left sided weakness.   OT comments  Pt progressing with OT goals, but continues to have deficits in cognition, strength, and standing balance. Pt overall min guard for sit to stand transfer with RW, improved carryover with hand placement, but required hands on assist to maintain balance while standing at sink completing grooming tasks. Pt demonstrated ability to stand > 5 minutes for ADLs requiring intermittent cues for initiation/termination of task and problem solving. Continue to recommend SNF for short term rehab to maximize independence and safety. Will continue to follow acutely.   Follow Up Recommendations  SNF;Supervision/Assistance - 24 hour    Equipment Recommendations  3 in 1 bedside commode    Recommendations for Other Services      Precautions / Restrictions Precautions Precautions: Fall Restrictions Weight Bearing Restrictions: No       Mobility Bed Mobility               General bed mobility comments: up in recliner on OT entry  Transfers Overall transfer level: Needs assistance Equipment used: Rolling walker (2 wheeled) Transfers: Sit to/from Stand Sit to Stand: Min guard         General transfer comment: min guard to sit to stand with RW, able to use proper hand placement with minimal cues    Balance Overall balance assessment:  Needs assistance Sitting-balance support: No upper extremity supported Sitting balance-Leahy Scale: Good     Standing balance support: Single extremity supported Standing balance-Leahy Scale: Poor Standing balance comment: Pt unable to take both hands off of walker to wring out wash cloth, reliant on at least one UE support on RW or sink                           ADL either performed or assessed with clinical judgement   ADL Overall ADL's : Needs assistance/impaired     Grooming: Wash/dry hands;Wash/dry face;Oral care;Min guard;Standing;Cueing for sequencing Grooming Details (indicate cue type and reason): Pt required min guard throughout to maintain balance with increasing assistance needed as pt fatigued standing at sink. Pt required cues for initiation and termination of tasks                               General ADL Comments: Pt with proper sequencing overall of ADLs standing at sink, but required cues for problem solving things attempted to wash face with wet paper towel, but OT suggested pt use washcloth and able to follow these commands     Vision       Perception     Praxis      Cognition Arousal/Alertness: Awake/alert Behavior During Therapy: Flat affect Overall Cognitive Status: Impaired/Different from baseline Area of Impairment: Attention;Memory;Following commands;Safety/judgement;Awareness;Problem solving  Orientation Level: Disoriented to;Situation Current Attention Level: Sustained Memory: Decreased short-term memory Following Commands: Follows one step commands with increased time Safety/Judgement: Decreased awareness of safety;Decreased awareness of deficits Awareness: Intellectual Problem Solving: Slow processing;Difficulty sequencing;Requires verbal cues;Requires tactile cues;Decreased initiation General Comments: Pt with difficulty in problem solving, requiring cues for initiation and termination of tasks         Exercises     Shoulder Instructions       General Comments      Pertinent Vitals/ Pain       Pain Assessment: No/denies pain  Home Living                                          Prior Functioning/Environment              Frequency  Min 2X/week        Progress Toward Goals  OT Goals(current goals can now be found in the care plan section)  Progress towards OT goals: Progressing toward goals  Acute Rehab OT Goals Patient Stated Goal: pt unable to state; sister agreeable to rehab OT Goal Formulation: With patient Time For Goal Achievement: 05/24/20 Potential to Achieve Goals: Fair ADL Goals Pt Will Perform Eating: with set-up;sitting Pt Will Perform Grooming: standing;with supervision Pt Will Perform Lower Body Dressing: with supervision;sit to/from stand Pt Will Transfer to Toilet: ambulating;with supervision  Plan Discharge plan remains appropriate    Co-evaluation                 AM-PAC OT "6 Clicks" Daily Activity     Outcome Measure   Help from another person eating meals?: A Little Help from another person taking care of personal grooming?: A Little Help from another person toileting, which includes using toliet, bedpan, or urinal?: A Lot Help from another person bathing (including washing, rinsing, drying)?: A Little Help from another person to put on and taking off regular upper body clothing?: A Little Help from another person to put on and taking off regular lower body clothing?: A Lot 6 Click Score: 16    End of Session Equipment Utilized During Treatment: Gait belt;Rolling walker  OT Visit Diagnosis: Unsteadiness on feet (R26.81);Other abnormalities of gait and mobility (R26.89);Muscle weakness (generalized) (M62.81);Other symptoms and signs involving cognitive function   Activity Tolerance Patient tolerated treatment well   Patient Left in chair;with call bell/phone within reach;with chair alarm set    Nurse Communication Mobility status        Time: 6468-0321 OT Time Calculation (min): 19 min  Charges: OT General Charges $OT Visit: 1 Visit OT Treatments $Self Care/Home Management : 8-22 mins  Layla Maw, OTR/L   Layla Maw 05/22/2020, 12:45 PM

## 2020-05-22 NOTE — Progress Notes (Signed)
Physical Therapy Treatment Patient Details Name: Darren Pearson MRN: [id number] DOB: 1966-04-07 Today's Date: 05/22/2020    History of Present Illness Pt is 54 yo male who was found slumped by the side of his house and was brought to [facility].  Pt found to have small acute infarcts in the basal ganglia, left corona radiata, right occipital white matter, left parietal periventricular white matter, and bilateral frontal white matter.  Additionally, pt found to have hgb of 6.0 and has received PRBC.  Pt s/p lumbar puncture to evaluate for possible vasculitis, results pending.  Pt with PMH of previous CVA in 11/2019 with residual left sided weakness.    PT Comments    Pt motivated to participate in PT this day, ambulating good hallway distance without AD to challenge pt's dynamic balance. Pt requires min PT assist to ambulate without AD, and demonstrates LE incoordination during gait. Pt tolerated balance and strengthening exercises but requires frequent and multimodal cuing to participate given pt's STM and processing deficits. PT updated follow up plan to reflect CIR, pt is motivated and PT feels CIR could maximize pt independence and functional recovery post-CVA. Will continue to follow acutely.     Follow Up Recommendations  CIR     Equipment Recommendations  Rolling walker with 5" wheels    Recommendations for Other Services Rehab consult     Precautions / Restrictions Precautions Precautions: Fall Restrictions Weight Bearing Restrictions: No    Mobility  Bed Mobility Overal bed mobility: Needs Assistance             General bed mobility comments: up in chair upon PT arrival to room, requests stay in recliner upon PT exit  Transfers Overall transfer level: Needs assistance Equipment used: None Transfers: Sit to/from Stand Sit to Stand: Min guard         General transfer comment: for safety, increased time to rise and steady with pt reaching for environment to steady  self  Ambulation/Gait Ambulation/Gait assistance: Min assist Gait Distance (Feet): 150 Feet (150+100) Assistive device: None;1 person hand held assist Gait Pattern/deviations: Step-through pattern;Decreased stride length;Shuffle;Trunk flexed;Narrow base of support;Ataxic Gait velocity: decr   General Gait Details: Min assist for steadying, guiding pt around hallway obstacles as pt tends to have decreased awareness of obstacles to avoid. Pt with R drift in hallway, partially related to pt reaching for environment to steady self. Pt assumes ataxic-appearing gait without use of AD, very unsteady with LOB x1 with looking forward followed by quickly looking down. Verbal cuing for walking in middle of hallway multiple times, upright posture.   Stairs             Wheelchair Mobility    Modified Rankin (Stroke Patients Only) Modified Rankin (Stroke Patients Only) Pre-Morbid Rankin Score: Moderately severe disability Modified Rankin: Moderately severe disability     Balance Overall balance assessment: Needs assistance Sitting-balance support: No upper extremity supported Sitting balance-Leahy Scale: Good     Standing balance support: Single extremity supported Standing balance-Leahy Scale: Fair Standing balance comment: ambulate without RW, requires intermittent assist from PT especially during challenges to gait               High Level Balance Comments: tandem gait x10 ft, pt unable to get to full tandem and with lateral unsteadiness noted            Cognition Arousal/Alertness: Awake/alert Behavior During Therapy: Flat affect Overall Cognitive Status: Impaired/Different from baseline Area of Impairment: Attention;Memory;Following commands;Safety/judgement;Awareness;Problem solving  Orientation Level: Disoriented to;Situation Current Attention Level: Sustained Memory: Decreased short-term memory Following Commands: Follows one step commands  with increased time Safety/Judgement: Decreased awareness of safety;Decreased awareness of deficits Awareness: Intellectual Problem Solving: Slow processing;Difficulty sequencing;Requires verbal cues;Requires tactile cues;Decreased initiation General Comments: Short term memory deficits noted during session, along with pt difficulty following commands during mobility (for example: step over this box - pt walks without paying attention to box)      Exercises General Exercises - Lower Extremity Hip Flexion/Marching: AROM;Both;10 reps;Standing (standing straight leg hip flexion, x10 bilaterally, with external cue of PT hand for target) Toe Raises: AROM;Both;10 reps;Standing (posterior unsteadiness requiring PT assist to correct) Mini-Sqauts: AROM;Both;10 reps;Standing (with bilateral UEs on chair rail in hallway, frequent verbal cuing and demonstration to perform correctly)    General Comments        Pertinent Vitals/Pain Pain Assessment: No/denies pain    Home Living                      Prior Function            PT Goals (current goals can now be found in the care plan section) Acute Rehab PT Goals Patient Stated Goal: pt unable to state; sister agreeable to rehab PT Goal Formulation: With patient/family Time For Goal Achievement: 05/24/20 Potential to Achieve Goals: Fair Progress towards PT goals: Progressing toward goals    Frequency    Min 3X/week      PT Plan Discharge plan needs to be updated    Co-evaluation              AM-PAC PT "6 Clicks" Mobility   Outcome Measure  Help needed turning from your back to your side while in a flat bed without using bedrails?: A Little Help needed moving from lying on your back to sitting on the side of a flat bed without using bedrails?: A Little Help needed moving to and from a bed to a chair (including a wheelchair)?: A Little Help needed standing up from a chair using your arms (e.g., wheelchair or bedside  chair)?: A Little Help needed to walk in hospital room?: A Little Help needed climbing 3-5 steps with a railing? : A Lot 6 Click Score: 17    End of Session Equipment Utilized During Treatment: Gait belt Activity Tolerance: Patient tolerated treatment well Patient left: with call bell/phone within reach;in chair;with chair alarm set Nurse Communication: Mobility status PT Visit Diagnosis: Unsteadiness on feet (R26.81);Muscle weakness (generalized) (M62.81)     Time: 1696-7893 PT Time Calculation (min) (ACUTE ONLY): 19 min  Charges:  $Gait Training: 8-22 mins                     Riku Buttery E, PT [place] Pager [phone]  Office [phone]   Cherril Hett D Elonda Husky 05/22/2020, 3:36 PM

## 2020-05-23 DIAGNOSIS — N179 Acute kidney failure, unspecified: Secondary | ICD-10-CM

## 2020-05-23 DIAGNOSIS — N182 Chronic kidney disease, stage 2 (mild): Secondary | ICD-10-CM

## 2020-05-23 LAB — BASIC METABOLIC PANEL
Anion gap: 9 (ref 5–15)
BUN: 21 mg/dL — ABNORMAL HIGH (ref 6–20)
CO2: 25 mmol/L (ref 22–32)
Calcium: 8.9 mg/dL (ref 8.9–10.3)
Chloride: 105 mmol/L (ref 98–111)
Creatinine, Ser: 1.24 mg/dL (ref 0.61–1.24)
GFR calc Af Amer: >60 mL/min (ref >60)
GFR calc non Af Amer: >60 mL/min (ref >60)
Glucose, Bld: 88 mg/dL (ref 70–99)
Potassium: 3.9 mmol/L (ref 3.5–5.1)
Sodium: 139 mmol/L (ref 135–145)

## 2020-05-23 LAB — CBC
HCT: 34.5 % — ABNORMAL LOW (ref 39.0–52.0)
Hemoglobin: 10.9 g/dL — ABNORMAL LOW (ref 13.0–17.0)
MCH: 26.1 pg (ref 26.0–34.0)
MCHC: 31.6 g/dL (ref 30.0–36.0)
MCV: 82.5 fL (ref 80.0–100.0)
Platelets: 303 10*3/uL (ref 150–400)
RBC: 4.18 MIL/uL — ABNORMAL LOW (ref 4.22–5.81)
RDW: 20.7 % — ABNORMAL HIGH (ref 11.5–15.5)
WBC: 6.1 10*3/uL (ref 4.0–10.5)
nRBC: 0 % (ref 0.0–0.2)

## 2020-05-23 NOTE — Progress Notes (Signed)
[facility] PROGRESS NOTE    Progress Note  Darren Pearson  KBT:[id number] DOB: 1965-12-25 DOA: 05/09/2020 PCP: Darren Perna, NP     Brief Narrative:   Darren Pearson is an 53 y.o. male with past medical history of hyperlipidemia CVA with left residual hemiparesis, hepatitis C comes in with 3 days of poor oral intake from the day of admission, his sister found him/in the couch and confused.  He has been not his normal self for the past 4 weeks not taking his medications regularly.  In the ED was borderline as a code stroke was seen by neurology initial CT scan of the brain showed no acute findings, was found to have a hemoglobin of 6 and a low MCV with a platelet count of 491, potassium of 3.3 and a creatinine of 2.4, he was type and screen and transfuse 3 unit of packed red blood cells MRI of the brain showed a small acute infarct probably an embolic phenomenon CTA showed moderate stenosis of the proximal right vertebral artery LP failed to demonstrate autoimmune process or vasculitis.  Neurology evaluated the patient and felt that the stroke was secondary to his anemia.  Gastroenterology was consulted and recommended endoscopy as an outpatient.  Patient evaluated by inpatient rehab and determined not appropriate for admission awaiting skilled nursing facility placement.  Assessment/Plan:   Acute  CVA (cerebral vascular accident) ([place]) MRI of the brain done on admission showed an acute infarct in the basal  ganglia, left corona radiata, right occipital white matter and left parietal periventricular white matter with bilateral frontal white matter involvement. Allergy was consulted and felt the CVA was due to due to her acute low hemoglobin. LP did not show signs of autoimmune or vasculitis. Aspirin is held on admission due to her low hemoglobin and complaint of melena. GI evaluated the patient recommended further evaluation as an outpatient after her stroke recovery. Aspirin was  started on 05/21/2020. Continue Lipitor.  Symptomatic anemia: Thought to be likely due to acute blood loss in the setting of chronic anemia. She has been using NSAIDs at home. GI recommended to restart her aspirin on 05/21/2020. She is status post 3 units of packed red blood cells her hemoglobin has been greater than 10.  Acute kidney injury: With a baseline creatinine of around one on admission 2.4, likely prerenal resolved after fluid resuscitation and blood transfusions.  Electrolyte imbalance hypokalemia/hypomagnesemia: Repleted orally now resolved.  Prolonged QTC: On 511 admission after potassium and magnesium supplementation is improved.  Chronic systolic heart failure: With an EF of 45% on 2020 appears euvolemic.  History of hepatitis C: Clinical picture was not consistent with hepatitis encephalopathy.  Essential hypertension: Continue Norvasc.  Bedbugs/findings most concerning for scabies: Currently on permethrin cream.  Ambulatory dysfunction: Hemiparesis from an old CVA and right-sided weakness, with ataxia awaiting skilled nursing facility placement.    DVT prophylaxis: scd Family Communication:none Status is: Inpatient  Remains inpatient appropriate because:Patient is currently medically stable awaiting for skilled nursing facility bed.   Dispo:  Patient From: [facility]  Planned Disposition: [facility]  Expected discharge date: 05/23/20  Medically stable for discharge: Yes         Code Status:     Code Status Orders  (From admission, onward)         Start     Ordered   05/09/20 1523  Full code  Continuous        05/09/20 1524  Code Status History    Date Active Date Inactive Code Status Order ID Comments User Context   11/20/2019 1804 11/23/2019 1856 Full Code [id number]  Lavina Hamman, MD ED   Advance Care Planning Activity        IV Access:    Peripheral IV   Procedures and diagnostic  studies:   No results found.   Medical Consultants:    None.  Anti-Infectives:   none  Subjective:    [name] no complaints sleepy this morning.  Objective:    Vitals:   05/21/20 2320 05/22/20 0718 05/22/20 1542 05/22/20 2326  BP: 133/79 127/84 130/89 130/81  Pulse: 84 84 79 83  Resp: 16 16 16 15   Temp: 98.6 F (37 C) 97.9 F (36.6 C) 98.8 F (37.1 C) 98.7 F (37.1 C)  TempSrc: Oral  Oral Oral  SpO2: 98% 100% 100% 100%   SpO2: 100 %   Intake/Output Summary (Last 24 hours) at 05/23/2020 0708 Last data filed at 05/23/2020 0548 Gross per 24 hour  Intake 120 ml  Output 1075 ml  Net -955 ml   There were no vitals filed for this visit.  Exam: General exam: In no acute distress. Respiratory system: Good air movement and clear to auscultation. Cardiovascular system: S1 & S2 heard, RRR. No JVD. Gastrointestinal system: Abdomen is nondistended, soft and nontender.  Extremities: No pedal edema. Skin: No rashes, lesions or ulcers  Data Reviewed:    Labs: Basic Metabolic Panel: Recent Labs  Lab 05/19/20 0444 05/23/20 0330  NA 138 139  K 4.2 3.9  CL 106 105  CO2 22 25  GLUCOSE 87 88  BUN 15 21*  CREATININE 1.19 1.24  CALCIUM 9.1 8.9   GFR CrCl cannot be calculated (Unknown ideal weight.). Liver Function Tests: Recent Labs  Lab 05/19/20 0444  AST 16  ALT 14  ALKPHOS 70  BILITOT 0.6  PROT 7.5  ALBUMIN 3.1*   No results for input(s): LIPASE, AMYLASE in the last 168 hours. No results for input(s): AMMONIA in the last 168 hours. Coagulation profile No results for input(s): INR, PROTIME in the last 168 hours. COVID-19 Labs  No results for input(s): DDIMER, FERRITIN, LDH, CRP in the last 72 hours.  Lab Results  Component Value Date   SARSCOV2NAA NEGATIVE 05/09/2020   [place] NEGATIVE 11/20/2019    CBC: Recent Labs  Lab 05/19/20 0444 05/21/20 0944 05/23/20 0330  WBC 6.2  --  6.1  NEUTROABS 4.0  --   --   HGB 10.8* 12.5*  10.9*  HCT 34.3* 40.7 34.5*  MCV 81.3  --  82.5  PLT 302  --  303   Cardiac Enzymes: No results for input(s): CKTOTAL, CKMB, CKMBINDEX, TROPONINI in the last 168 hours. BNP (last 3 results) No results for input(s): PROBNP in the last 8760 hours. CBG: No results for input(s): GLUCAP in the last 168 hours. D-Dimer: No results for input(s): DDIMER in the last 72 hours. Hgb A1c: No results for input(s): HGBA1C in the last 72 hours. Lipid Profile: No results for input(s): CHOL, HDL, LDLCALC, TRIG, CHOLHDL, LDLDIRECT in the last 72 hours. Thyroid function studies: No results for input(s): TSH, T4TOTAL, T3FREE, THYROIDAB in the last 72 hours.  Invalid input(s): FREET3 Anemia work up: No results for input(s): VITAMINB12, FOLATE, FERRITIN, TIBC, IRON, RETICCTPCT in the last 72 hours. Sepsis Labs: Recent Labs  Lab 05/19/20 0444 05/23/20 0330  WBC 6.2 6.1   Microbiology No results found for this or  any previous visit (from the past 240 hour(s)).   Medications:   . sodium chloride   Intravenous Once  . amLODipine  10 mg Oral Daily  . aspirin EC  81 mg Oral Daily  . atorvastatin  40 mg Oral q1800  . lidocaine (PF)  5 mL Intradermal Once  . permethrin  1 application Topical Once  . sodium chloride flush  3 mL Intravenous Q12H  . cyanocobalamin  1,000 mcg Oral Daily   Continuous Infusions:    LOS: 14 days   Charlynne Cousins  [facility]  05/23/2020, 7:08 AM

## 2020-05-24 DIAGNOSIS — D649 Anemia, unspecified: Secondary | ICD-10-CM

## 2020-05-24 MED ORDER — LISINOPRIL 20 MG PO TABS
20.0000 mg | ORAL_TABLET | Freq: Every day | ORAL | 11 refills | Status: DC
Start: 2020-05-24 — End: 2021-08-19

## 2020-05-24 NOTE — Plan of Care (Signed)
Problem: Education: Goal: Knowledge of General Education information will improve Description: Including pain rating scale, medication(s)/side effects and non-pharmacologic comfort measures 05/24/2020 0003 by Burlene Montecalvo, [place] L, RN Outcome: Progressing 05/24/2020 0003 by Ricquel Foulk, [place] L, RN Outcome: Progressing   Problem: Health Behavior/Discharge Planning: Goal: Ability to manage health-related needs will improve 05/24/2020 0003 by Francessca Friis, [place] L, RN Outcome: Progressing 05/24/2020 0003 by Chaise Passarella, [place] L, RN Outcome: Progressing   Problem: Clinical Measurements: Goal: Ability to maintain clinical measurements within normal limits will improve 05/24/2020 0003 by Eugune Sine, [place] L, RN Outcome: Progressing 05/24/2020 0003 by Keyonia Gluth, [place] L, RN Outcome: Progressing Goal: Will remain free from infection 05/24/2020 0003 by Debby Clyne, [place] L, RN Outcome: Progressing 05/24/2020 0003 by Kanishk Stroebel, [place] L, RN Outcome: Progressing Goal: Diagnostic test results will improve 05/24/2020 0003 by Saraphina Lauderbaugh, [place] L, RN Outcome: Progressing 05/24/2020 0003 by Maryella Abood, [place] L, RN Outcome: Progressing Goal: Respiratory complications will improve 05/24/2020 0003 by Carrington Mullenax, [place] L, RN Outcome: Progressing 05/24/2020 0003 by Keona Sheffler, [place] L, RN Outcome: Progressing Goal: Cardiovascular complication will be avoided 05/24/2020 0003 by Lelania Bia, [place] L, RN Outcome: Progressing 05/24/2020 0003 by Ryken Paschal, [place] L, RN Outcome: Progressing   Problem: Activity: Goal: Risk for activity intolerance will decrease 05/24/2020 0003 by Miloh Alcocer, [place] L, RN Outcome: Progressing 05/24/2020 0003 by Jezreel Sisk, [place] L, RN Outcome: Progressing   Problem: Nutrition: Goal: Adequate nutrition will be maintained 05/24/2020 0003 by Amr Sturtevant, [place] L, RN Outcome: Progressing 05/24/2020 0003 by Adelin Ventrella, [place] L, RN Outcome: Progressing   Problem: Coping: Goal: Level of anxiety will decrease 05/24/2020 0003 by Turki Tapanes,  [place] L, RN Outcome: Progressing 05/24/2020 0003 by Michaelina Blandino, [place] L, RN Outcome: Progressing   Problem: Elimination: Goal: Will not experience complications related to bowel motility 05/24/2020 0003 by Quadir Muns, [place] L, RN Outcome: Progressing 05/24/2020 0003 by Bradin Mcadory, [place] L, RN Outcome: Progressing Goal: Will not experience complications related to urinary retention 05/24/2020 0003 by Eduard Penkala, [place] L, RN Outcome: Progressing 05/24/2020 0003 by Fionna Merriott, [place] L, RN Outcome: Progressing   Problem: Pain Managment: Goal: General experience of comfort will improve 05/24/2020 0003 by Anayah Arvanitis, [place] L, RN Outcome: Progressing 05/24/2020 0003 by Kayce Betty, [place] L, RN Outcome: Progressing   Problem: Safety: Goal: Ability to remain free from injury will improve 05/24/2020 0003 by Bobbette Eakes, [place] L, RN Outcome: Progressing 05/24/2020 0003 by Euclide Granito, [place] L, RN Outcome: Progressing   Problem: Skin Integrity: Goal: Risk for impaired skin integrity will decrease 05/24/2020 0003 by Jahmeir Geisen, [place] L, RN Outcome: Progressing 05/24/2020 0003 by Dessirae Scarola, [place] L, RN Outcome: Progressing   Problem: Education: Goal: Knowledge of disease or condition will improve 05/24/2020 0003 by Shahiem Bedwell, [place] L, RN Outcome: Progressing 05/24/2020 0003 by Tyrrell Stephens, [place] L, RN Outcome: Progressing Goal: Knowledge of secondary prevention will improve 05/24/2020 0003 by Zayden Maffei, [place] L, RN Outcome: Progressing 05/24/2020 0003 by Emiya Loomer, [place] L, RN Outcome: Progressing Goal: Knowledge of patient specific risk factors addressed and post discharge goals established will improve Outcome: Progressing Goal: Individualized Educational Video(s) Outcome: Progressing   Problem: Coping: Goal: Will verbalize positive feelings about self Outcome: Progressing Goal: Will identify appropriate support needs Outcome: Progressing   Problem: Health Behavior/Discharge Planning: Goal: Ability to manage health-related  needs will improve Outcome: Progressing   Problem: Self-Care: Goal: Ability to participate in self-care as condition permits will improve Outcome: Progressing Goal: Verbalization of feelings and concerns over difficulty with self-care will improve Outcome: Progressing Goal: Ability to communicate needs accurately will improve Outcome: Progressing   Problem: Nutrition: Goal: Risk of  aspiration will decrease Outcome: Progressing Goal: Dietary intake will improve Outcome: Progressing   Problem: Ischemic Stroke/TIA Tissue Perfusion: Goal: Complications of ischemic stroke/TIA will be minimized Outcome: Progressing

## 2020-05-24 NOTE — Discharge Summary (Addendum)
Physician Discharge Summary  ELONZO SOPP ZSW:[id number] DOB: 03-13-1966 DOA: 05/09/2020  PCP: [name], NP  Admit date: 05/09/2020 Discharge date: 05/25/2020  Admitted From: Home Disposition:  SNF  Recommendations for Outpatient Follow-up:  1. Follow up with PCP in 1-2 weeks  Home Health:no Equipment/Devices:None  Discharge Condition:Stable CODE STATUS:Full Diet recommendation: Heart Healthy   Brief/Interim Summary: 54 y.o. male with past medical history of hyperlipidemia CVA with left residual hemiparesis, hepatitis C comes in with 3 days of poor oral intake from the day of admission, his sister found him/in the couch and confused.  He has been not his normal self for the past 4 weeks not taking his medications regularly.  In the ED was borderline as a code stroke was seen by neurology initial CT scan of the brain showed no acute findings, was found to have a hemoglobin of 6 and a low MCV with a platelet count of 491, potassium of 3.3 and a creatinine of 2.4, he was type and screen and transfuse 3 unit of packed red blood cells MRI of the brain showed a small acute infarct probably an embolic phenomenon CTA showed moderate stenosis of the proximal right vertebral artery LP failed to demonstrate autoimmune process or vasculitis.  Neurology evaluated the patient and felt that the stroke was secondary to his anemia.  Gastroenterology was consulted and recommended endoscopy as an outpatient.  Discharge Diagnoses:  Principal Problem:   CVA (cerebral vascular accident) ([place]) Active Problems:   Hepatitis C   Hypokalemia   Hyperammonemia (HCC)   Episode of unresponsiveness   Symptomatic anemia   History of CVA (cerebrovascular accident)   Elevated troponin   AKI (acute kidney injury) ([place])   CKD (chronic kidney disease), stage II  Acute CVA: MRI of the brain done on admission showed an acute infarct in the basal ganglia, left corona radiata and right occipital white matter and  left parietal periventricular white matter neurology was consulted and they believe this CVA was likely due to low hemoglobin, LP did not show signs of vasculitis or autoimmune disease. Aspirin was held due to low hemoglobin GI recommended to follow-up with them for further GI work-up as an outpatient. Her aspirin was restarted as an outpatient along with Lipitor.  Symptomatic anemia: Likely due to acute blood loss in the setting of chronic. The patient had been using NSAID he was counseled. He had recommended to restart aspirin on 05/21/2020 he status post 3 units of packed red blood cells his hemoglobin since it has remained stable.  Acute kidney injury: Likely prerenal azotemia resolved with IV fluid hydration.  Electrolyte imbalance hypokalemia/hypomagnesemia: Repleted lower and he now resolved.  Prolonged QTC: Likely due to electrolyte imbalance repleted now resolved.  Chronic systolic heart failure: Appears euvolemic continue only lisinopril follow-up with PCP as an outpatient titrate medications as needed.  History of hepatitis C: Clinical picture most consistent over the setting of encephalopathy.  Essential hypertension: Norvasc and hydrochlorothiazide discontinue he was continued on lisinopril his blood pressures remained stable.  Bedbugs/scabies: Treated with permethrin cream.  Ambulatory dysfunction: Physical therapy evaluated the patient recommended skilled nursing facility  Discharge Instructions  Discharge Instructions    Ambulatory referral to Neurology   Complete by: As directed    Follow up with stroke clinic NP (Jessica Vanschaick or Cecille Rubin, if both not available, consider Zachery Dauer, or Ahern) at [facility] in about 4 weeks. Thanks.   Diet - low sodium heart healthy   Complete by: As directed  Increase activity slowly   Complete by: As directed      Allergies as of 05/24/2020      Reactions   Sulfa Antibiotics Hives      Medication List     STOP taking these medications   amLODipine 10 MG tablet Commonly known as: NORVASC   lisinopril-hydrochlorothiazide 20-25 MG tablet Commonly known as: ZESTORETIC     TAKE these medications   acetaminophen 325 MG tablet Commonly known as: TYLENOL Take 650 mg by mouth every 6 (six) hours as needed for mild pain or headache.   aspirin 81 MG EC tablet Take 1 tablet (81 mg total) by mouth daily.   atorvastatin 40 MG tablet Commonly known as: LIPITOR Take 1 tablet (40 mg total) by mouth daily at 6 PM.   cyanocobalamin 1000 MCG tablet Take 1 tablet (1,000 mcg total) by mouth daily.   lisinopril 20 MG tablet Commonly known as: ZESTRIL Take 1 tablet (20 mg total) by mouth daily.       Follow-up Information    Frann Rider, NP. Go on 05/14/2020.   Specialty: Neurology Contact information: [address] [place] [address] [phone]              Allergies  Allergen Reactions  . Sulfa Antibiotics Hives    Consultations:  GI  Neurology   Procedures/Studies: CT Code Stroke CTA Head W/WO contrast  Result Date: 05/09/2020 CLINICAL DATA:  Unable to speak.  Stroke. EXAM: CT ANGIOGRAPHY HEAD AND NECK CT PERFUSION BRAIN TECHNIQUE: Multidetector CT imaging of the head and neck was performed using the standard protocol during bolus administration of intravenous contrast. Multiplanar CT image reconstructions and MIPs were obtained to evaluate the vascular anatomy. Carotid stenosis measurements (when applicable) are obtained utilizing NASCET criteria, using the distal internal carotid diameter as the denominator. Multiphase CT imaging of the brain was performed following IV bolus contrast injection. Subsequent parametric perfusion maps were calculated using RAPID software. CONTRAST:  182mL OMNIPAQUE IOHEXOL 350 MG/ML SOLN COMPARISON:  CT head 05/09/2020.  CTA head 11/21/2019 FINDINGS: CTA NECK FINDINGS Aortic arch: Standard branching. Imaged portion shows no evidence of  aneurysm or dissection. No significant stenosis of the major arch vessel origins. Right carotid system: Right carotid widely patent. Minimal atherosclerotic disease right carotid bulb. Left carotid system: Left carotid widely patent. Minimal atherosclerotic disease left carotid bulb. Vertebral arteries: Right vertebral artery dominant. Moderate stenosis proximal right vertebral artery unchanged. Remainder of the right vertebral artery is patent. Small left vertebral artery ends in PICA without focal stenosis. Skeleton: Cervical spondylosis.  No acute skeletal abnormality. Other neck: Negative for mass or adenopathy in the neck. Upper chest: Prominent apical blebs and emphysema. No acute abnormality. Review of the MIP images confirms the above findings CTA HEAD FINDINGS Anterior circulation: Mild atherosclerotic calcification in the cavernous carotid bilaterally. 2 mm left paraclinoid aneurysm projecting laterally is unchanged. Anterior and middle cerebral arteries widely patent without significant stenosis or occlusion. Posterior circulation: Right vertebral artery supplies the basilar which is patent. Right PICA patent. Left vertebral artery is small and ends in PICA. Superior cerebellar and posterior cerebral arteries patent bilaterally without stenosis. Fetal origin right posterior cerebral artery. No aneurysm in the posterior circulation. Venous sinuses: Normal venous enhancement Anatomic variants: None Review of the MIP images confirms the above findings CT Brain Perfusion Findings: ASPECTS: 10 CBF (<30%) Volume: 52mL Perfusion (Tmax>6.0s) volume: 29mL Mismatch Volume: 110mL Infarction Location:None IMPRESSION: 1. CT perfusion negative for acute ischemia or infarction 2.  Negative for intracranial large vessel occlusion or flow limiting stenosis 3. Both carotid arteries are widely patent in the neck with minimal atherosclerotic disease. 4. Moderate stenosis proximal right vertebral artery which is dominant. Small  left vertebral artery ends in PICA 5. 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 Electronically Signed   By: Franchot Gallo M.D.   On: 05/09/2020 12:27   CT Code Stroke CTA Neck W/WO contrast  Result Date: 05/09/2020 CLINICAL DATA:  Unable to speak.  Stroke. EXAM: CT ANGIOGRAPHY HEAD AND NECK CT PERFUSION BRAIN TECHNIQUE: Multidetector CT imaging of the head and neck was performed using the standard protocol during bolus administration of intravenous contrast. Multiplanar CT image reconstructions and MIPs were obtained to evaluate the vascular anatomy. Carotid stenosis measurements (when applicable) are obtained utilizing NASCET criteria, using the distal internal carotid diameter as the denominator. Multiphase CT imaging of the brain was performed following IV bolus contrast injection. Subsequent parametric perfusion maps were calculated using RAPID software. CONTRAST:  144mL OMNIPAQUE IOHEXOL 350 MG/ML SOLN COMPARISON:  CT head 05/09/2020.  CTA head 11/21/2019 FINDINGS: CTA NECK FINDINGS Aortic arch: Standard branching. Imaged portion shows no evidence of aneurysm or dissection. No significant stenosis of the major arch vessel origins. Right carotid system: Right carotid widely patent. Minimal atherosclerotic disease right carotid bulb. Left carotid system: Left carotid widely patent. Minimal atherosclerotic disease left carotid bulb. Vertebral arteries: Right vertebral artery dominant. Moderate stenosis proximal right vertebral artery unchanged. Remainder of the right vertebral artery is patent. Small left vertebral artery ends in PICA without focal stenosis. Skeleton: Cervical spondylosis.  No acute skeletal abnormality. Other neck: Negative for mass or adenopathy in the neck. Upper chest: Prominent apical blebs and emphysema. No acute abnormality. Review of the MIP images confirms the above findings CTA HEAD FINDINGS Anterior circulation: Mild atherosclerotic  calcification in the cavernous carotid bilaterally. 2 mm left paraclinoid aneurysm projecting laterally is unchanged. Anterior and middle cerebral arteries widely patent without significant stenosis or occlusion. Posterior circulation: Right vertebral artery supplies the basilar which is patent. Right PICA patent. Left vertebral artery is small and ends in PICA. Superior cerebellar and posterior cerebral arteries patent bilaterally without stenosis. Fetal origin right posterior cerebral artery. No aneurysm in the posterior circulation. Venous sinuses: Normal venous enhancement Anatomic variants: None Review of the MIP images confirms the above findings CT Brain Perfusion Findings: ASPECTS: 10 CBF (<30%) Volume: 4mL Perfusion (Tmax>6.0s) volume: 42mL Mismatch Volume: 94mL Infarction Location:None IMPRESSION: 1. CT perfusion negative for acute ischemia or infarction 2. Negative for intracranial large vessel occlusion or flow limiting stenosis 3. Both carotid arteries are widely patent in the neck with minimal atherosclerotic disease. 4. Moderate stenosis proximal right vertebral artery which is dominant. Small left vertebral artery ends in PICA 5. 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 Electronically Signed   By: Franchot Gallo M.D.   On: 05/09/2020 12:27   MR Brain Wo Contrast (neuro protocol)  Result Date: 05/09/2020 CLINICAL DATA:  Altered mental status. EXAM: MRI HEAD WITHOUT CONTRAST TECHNIQUE: Multiplanar, multiecho pulse sequences of the brain and surrounding structures were obtained without intravenous contrast. COMPARISON:  Head CT, CTA, and CTP 05/09/2020.  Head MRI 11/20/2019. FINDINGS: Due to the patient's altered mental status, the examination was terminated prior to completion. Axial and coronal diffusion, sagittal T1, axial FLAIR, and axial T2 sequences were obtained and are motion degraded (including severe motion on the axial  T2 sequence). Brain: There  are small acute infarcts in the left basal ganglia, left corona radiata, right occipital white matter, left parietal periventricular white matter, and bilateral frontal white matter. The largest infarct is in the left corona radiata and measures 1.1 cm. There is a background of advanced chronic small vessel ischemia involving the cerebral white matter, incompletely evaluated on this limited, motion degraded examination. Chronic infarcts are again noted in the deep gray nuclei, pons, and cerebellum. There is age advanced cerebral atrophy. No gross intracranial hemorrhage, midline shift, or extra-axial fluid collection is identified. Vascular: More fully evaluated on earlier CTA. Skull and upper cervical spine: No destructive skull lesion. Sinuses/Orbits: Grossly unremarkable orbits. Paranasal sinuses and mastoid air cells are clear. Other: None. IMPRESSION: 1. Motion degraded, incomplete examination. 2. Small acute bilateral cerebral infarcts compatible with emboli. 3. Advanced chronic small vessel ischemia. Electronically Signed   By: Logan Bores M.D.   On: 05/09/2020 18:15   CT Code Stroke Cerebral Perfusion with contrast  Result Date: 05/09/2020 CLINICAL DATA:  Unable to speak.  Stroke. EXAM: CT ANGIOGRAPHY HEAD AND NECK CT PERFUSION BRAIN TECHNIQUE: Multidetector CT imaging of the head and neck was performed using the standard protocol during bolus administration of intravenous contrast. Multiplanar CT image reconstructions and MIPs were obtained to evaluate the vascular anatomy. Carotid stenosis measurements (when applicable) are obtained utilizing NASCET criteria, using the distal internal carotid diameter as the denominator. Multiphase CT imaging of the brain was performed following IV bolus contrast injection. Subsequent parametric perfusion maps were calculated using RAPID software. CONTRAST:  122mL OMNIPAQUE IOHEXOL 350 MG/ML SOLN COMPARISON:  CT head 05/09/2020.  CTA head 11/21/2019 FINDINGS: CTA NECK  FINDINGS Aortic arch: Standard branching. Imaged portion shows no evidence of aneurysm or dissection. No significant stenosis of the major arch vessel origins. Right carotid system: Right carotid widely patent. Minimal atherosclerotic disease right carotid bulb. Left carotid system: Left carotid widely patent. Minimal atherosclerotic disease left carotid bulb. Vertebral arteries: Right vertebral artery dominant. Moderate stenosis proximal right vertebral artery unchanged. Remainder of the right vertebral artery is patent. Small left vertebral artery ends in PICA without focal stenosis. Skeleton: Cervical spondylosis.  No acute skeletal abnormality. Other neck: Negative for mass or adenopathy in the neck. Upper chest: Prominent apical blebs and emphysema. No acute abnormality. Review of the MIP images confirms the above findings CTA HEAD FINDINGS Anterior circulation: Mild atherosclerotic calcification in the cavernous carotid bilaterally. 2 mm left paraclinoid aneurysm projecting laterally is unchanged. Anterior and middle cerebral arteries widely patent without significant stenosis or occlusion. Posterior circulation: Right vertebral artery supplies the basilar which is patent. Right PICA patent. Left vertebral artery is small and ends in PICA. Superior cerebellar and posterior cerebral arteries patent bilaterally without stenosis. Fetal origin right posterior cerebral artery. No aneurysm in the posterior circulation. Venous sinuses: Normal venous enhancement Anatomic variants: None Review of the MIP images confirms the above findings CT Brain Perfusion Findings: ASPECTS: 10 CBF (<30%) Volume: 37mL Perfusion (Tmax>6.0s) volume: 32mL Mismatch Volume: 39mL Infarction Location:None IMPRESSION: 1. CT perfusion negative for acute ischemia or infarction 2. Negative for intracranial large vessel occlusion or flow limiting stenosis 3. Both carotid arteries are widely patent in the neck with minimal atherosclerotic disease. 4.  Moderate stenosis proximal right vertebral artery which is dominant. Small left vertebral artery ends in PICA 5. 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 Electronically Signed   By: Franchot Gallo M.D.  On: 05/09/2020 12:27   DG Chest Portable 1 View  Result Date: 05/09/2020 CLINICAL DATA:  Unresponsive. EXAM: PORTABLE CHEST 1 VIEW COMPARISON:  November 20, 2019. FINDINGS: The heart size and mediastinal contours are within normal limits. Emphysematous disease is noted in the upper lobes. No consolidative process is noted. The visualized skeletal structures are unremarkable. IMPRESSION: No active disease. Emphysema (ICD10-J43.9). Electronically Signed   By: Marijo Conception M.D.   On: 05/09/2020 12:45   DG Swallowing Func-Speech Pathology  Result Date: 05/14/2020 Objective Swallowing Evaluation: Type of Study: MBS-Modified Barium Swallow Study  Patient Details Name: Darren Pearson MRN: [id number] Date of Birth: Dec 08, 1965 Today's Date: 05/14/2020 Time: SLP Start Time (ACUTE ONLY): 87 -SLP Stop Time (ACUTE ONLY): 1254 SLP Time Calculation (min) (ACUTE ONLY): 24 min Past Medical History: Past Medical History: Diagnosis Date . Allergy  . Anxiety and depression  . Asthma  . Hepatitis C  . History of chicken pox  . Hypertension  Past Surgical History: Past Surgical History: Procedure Laterality Date . BUBBLE STUDY  11/22/2019  Procedure: BUBBLE STUDY;  Surgeon: Dorothy Spark, MD;  Location: [facility];  Service: Cardiovascular;; . LOOP RECORDER INSERTION N/A 11/22/2019  Procedure: LOOP RECORDER INSERTION;  Surgeon: Thompson Grayer, MD;  Location: [facility];  Service: Cardiovascular;  Laterality: N/A; . TEE WITHOUT CARDIOVERSION N/A 11/22/2019  Procedure: TRANSESOPHAGEAL ECHOCARDIOGRAM (TEE);  Surgeon: Dorothy Spark, MD;  Location: [facility];  Service: Cardiovascular;  Laterality: N/A; HPI: PAIDEN CAVELL is a 54 y.o. male with medical history  significant of hypertension, hyperlipidemia, CVA with residual left-sided hemiparesis, hepatitis C, anxiety, and depression who presents after his sister found him slouch against the house outside. Over the last couple of days patient had been refusing to eat certain meals throughout the day despite his sister fixing things that he usually likes.  Over the last 3 to 4 weeks he had not been his normal self, was not taking his medications regularly, was not getting out of bed, or taking showers.  Patient has reportedly been lying in bed continuously and came in with bedbugs.  On physical exam patient had some findings suggestive possibly of scabies with rash in between the webs of his feet. MRI did demonstrate small acute infarcts in the basal ganglia, left corona radiata, right occipital white matter, left parietal periventricular white matter, and bilateral frontal white matter. It is felt that this appearance is compatibile emboli.  Subjective: alert, participatory Assessment / Plan / Recommendation CHL IP CLINICAL IMPRESSIONS 05/14/2020 Clinical Impression Pt presents with normal oropharyngeal swallow with adequate mastication and bolus control/propulsion, timely swallow response, adequate laryngeal-vestibule closure consistently across all PO trials. No penetration/aspiration observed.  Notable was some retention of liquid consistencies in what appeared to be a diverticulum-like pouch near the petiole of the epiglottis (A-P view confirmed presence on right side of pharynx).  No radiologist present to dx finding.  Otherwise, there were no impairments noted during the swallow.  Continue current regular diet, thin liquids.   SLP Visit Diagnosis Dysphagia, unspecified (R13.10) Attention and concentration deficit following -- Frontal lobe and executive function deficit following -- Impact on safety and function --   CHL IP TREATMENT RECOMMENDATION 05/14/2020 Treatment Recommendations Therapy as outlined in treatment  plan below   Prognosis 05/10/2020 Prognosis for Safe Diet Advancement Good Barriers to Reach Goals -- Barriers/Prognosis Comment -- CHL IP DIET RECOMMENDATION 05/14/2020 SLP Diet Recommendations Regular solids;Thin liquid Liquid Administration via Cup;Straw Medication Administration Whole meds with  liquid Compensations Slow rate;Small sips/bites Postural Changes Remain semi-upright after after feeds/meals (Comment)   CHL IP OTHER RECOMMENDATIONS 05/14/2020 Recommended Consults -- Oral Care Recommendations Oral care BID Other Recommendations --   CH[facility] IP FOLLOW UP RECOMMENDATIONS 05/14/2020 Follow up Recommendations Skilled Nursing facility   [facility] IP FREQUENCY AND DURATION 05/11/2020 Speech Therapy Frequency (ACUTE ONLY) min 2x/week Treatment Duration --      CHL IP ORAL PHASE 05/14/2020 Oral Phase WFL Oral - Pudding Teaspoon -- Oral - Pudding Cup -- Oral - Honey Teaspoon -- Oral - Honey Cup -- Oral - Nectar Teaspoon -- Oral - Nectar Cup -- Oral - Nectar Straw -- Oral - Thin Teaspoon -- Oral - Thin Cup -- Oral - Thin Straw -- Oral - Puree -- Oral - Mech Soft -- Oral - Regular -- Oral - Multi-Consistency -- Oral - Pill -- Oral Phase - Comment --  CHL IP PHARYNGEAL PHASE 05/14/2020 Pharyngeal Phase WFL Pharyngeal- Pudding Teaspoon -- Pharyngeal -- Pharyngeal- Pudding Cup -- Pharyngeal -- Pharyngeal- Honey Teaspoon -- Pharyngeal -- Pharyngeal- Honey Cup -- Pharyngeal -- Pharyngeal- Nectar Teaspoon -- Pharyngeal -- Pharyngeal- Nectar Cup -- Pharyngeal -- Pharyngeal- Nectar Straw -- Pharyngeal -- Pharyngeal- Thin Teaspoon -- Pharyngeal -- Pharyngeal- Thin Cup -- Pharyngeal -- Pharyngeal- Thin Straw -- Pharyngeal -- Pharyngeal- Puree -- Pharyngeal -- Pharyngeal- Mechanical Soft -- Pharyngeal -- Pharyngeal- Regular -- Pharyngeal -- Pharyngeal- Multi-consistency -- Pharyngeal -- Pharyngeal- Pill -- Pharyngeal -- Pharyngeal Comment --  CHL IP CERVICAL ESOPHAGEAL PHASE 05/14/2020 Cervical Esophageal Phase WFL Pudding Teaspoon -- Pudding  Cup -- Honey Teaspoon -- Honey Cup -- Nectar Teaspoon -- Nectar Cup -- Nectar Straw -- Thin Teaspoon -- Thin Cup -- Thin Straw -- Puree -- Mechanical Soft -- Regular -- Multi-consistency -- Pill -- Cervical Esophageal Comment -- Juan Quam Laurice 05/14/2020, 2:35 PM              ECHOCARDIOGRAM COMPLETE  Result Date: 05/10/2020    ECHOCARDIOGRAM REPORT   Patient Name:   FITZPATRICK ALBERICO Date of Exam: 05/10/2020 Medical Rec #:  [id number]       Height:       69.0 in Accession #:    [id number]      Weight:       155.8 lb Date of Birth:  Jun 10, 1966        BSA:          1.858 m Patient Age:    2 years        BP:           112/79 mmHg Patient Gender: M               HR:           84 bpm. Exam Location:  Inpatient Procedure: 2D Echo, Cardiac Doppler and Color Doppler Indications:    Stroke 434.91 / I163.9  History:        Patient has no prior history of Echocardiogram examinations.                 Risk Factors:Hypertension.  Sonographer:    Tiffany Dance Referring Phys: [id number] RONDELL A SMITH IMPRESSIONS  1. Abnormal septal motion . Left ventricular ejection fraction, by estimation, is 50 to 55%. The left ventricle has low normal function. The left ventricle has no regional wall motion abnormalities. Left ventricular diastolic parameters were normal.  2. Right ventricular systolic function is normal. The right ventricular size is normal.  3. The mitral valve is normal  in structure. Trivial mitral valve regurgitation. No evidence of mitral stenosis.  4. The aortic valve is normal in structure. Aortic valve regurgitation is not visualized. No aortic stenosis is present.  5. The inferior vena cava is normal in size with greater than 50% respiratory variability, suggesting right atrial pressure of 3 mmHg. FINDINGS  Left Ventricle: Abnormal septal motion. Left ventricular ejection fraction, by estimation, is 50 to 55%. The left ventricle has low normal function. The left ventricle has no regional wall motion  abnormalities. The left ventricular internal cavity size was normal in size. There is no left ventricular hypertrophy. Left ventricular diastolic parameters were normal. Right Ventricle: The right ventricular size is normal. No increase in right ventricular wall thickness. Right ventricular systolic function is normal. Left Atrium: Left atrial size was normal in size. Right Atrium: Right atrial size was normal in size. Pericardium: There is no evidence of pericardial effusion. Mitral Valve: The mitral valve is normal in structure. Normal mobility of the mitral valve leaflets. Trivial mitral valve regurgitation. No evidence of mitral valve stenosis. Tricuspid Valve: The tricuspid valve is normal in structure. Tricuspid valve regurgitation is trivial. No evidence of tricuspid stenosis. Aortic Valve: The aortic valve is normal in structure. Aortic valve regurgitation is not visualized. No aortic stenosis is present. Pulmonic Valve: The pulmonic valve was normal in structure. Pulmonic valve regurgitation is not visualized. No evidence of pulmonic stenosis. Aorta: The aortic root is normal in size and structure. Venous: The inferior vena cava is normal in size with greater than 50% respiratory variability, suggesting right atrial pressure of 3 mmHg. IAS/Shunts: The interatrial septum appears to be lipomatous. No atrial level shunt detected by color flow Doppler.  LEFT VENTRICLE PLAX 2D LVIDd:         4.20 cm  Diastology LVIDs:         3.50 cm  LV e' lateral:   12.50 cm/s LV PW:         1.00 cm  LV E/e' lateral: 7.0 LV IVS:        1.00 cm  LV e' medial:    8.38 cm/s LVOT diam:     2.30 cm  LV E/e' medial:  10.5 LV SV:         84 LV SV Index:   45 LVOT Area:     4.15 cm  RIGHT VENTRICLE             IVC RV Basal diam:  2.50 cm     IVC diam: 1.40 cm RV S prime:     11.90 cm/s TAPSE (M-mode): 1.7 cm LEFT ATRIUM             Index       RIGHT ATRIUM           Index LA diam:        3.10 cm 1.67 cm/m  RA Area:     18.70 cm LA  Vol (A2C):   30.6 ml 16.47 ml/m RA Volume:   55.90 ml  30.09 ml/m LA Vol (A4C):   25.4 ml 13.67 ml/m LA Biplane Vol: 28.4 ml 15.29 ml/m  AORTIC VALVE LVOT Vmax:   99.20 cm/s LVOT Vmean:  65.100 cm/s LVOT VTI:    0.201 m  AORTA Ao Root diam: 3.70 cm MITRAL VALVE MV Area (PHT): 3.08 cm    SHUNTS MV Decel Time: 246 msec    Systemic VTI:  0.20 m MV E velocity: 87.80 cm/s  Systemic Diam: 2.30 cm  MV A velocity: 76.30 cm/s MV E/A ratio:  1.15 Jenkins Rouge MD Electronically signed by Jenkins Rouge MD Signature Date/Time: 05/10/2020/5:10:24 PM    Final    CT HEAD CODE STROKE WO CONTRAST  Result Date: 05/09/2020 CLINICAL DATA:  Code stroke.  Ataxia.  Unable to speak. EXAM: CT HEAD WITHOUT CONTRAST TECHNIQUE: Contiguous axial images were obtained from the base of the skull through the vertex without intravenous contrast. COMPARISON:  CT head 11/20/2019 FINDINGS: Brain: Moderate atrophy with interval progression. Negative for hydrocephalus. Chronic microvascular ischemic changes in the white matter. Chronic lacunar infarctions in the internal capsule right greater than left unchanged. Chronic infarcts in the pons bilaterally. Negative for acute infarct, hemorrhage, mass. Vascular: Negative for hyperdense vessel Skull: Negative Sinuses/Orbits: Paranasal sinuses clear.  Negative orbit. Other: None ASPECTS ([place] Stroke Program Early CT Score) - Ganglionic level infarction (caudate, lentiform nuclei, internal capsule, insula, M1-M3 cortex): 7 - Supraganglionic infarction (M4-M6 cortex): 3 Total score (0-10 with 10 being normal): 10 IMPRESSION: 1. No acute abnormality 2. ASPECTS is 10 3. Moderate atrophy and moderate to advanced chronic ischemic changes. Electronically Signed   By: Franchot Gallo M.D.   On: 05/09/2020 12:16    Subjective: No new complaints.  Discharge Exam: Vitals:   05/23/20 2123 05/24/20 0746  BP: 140/82 (!) 122/92  Pulse: 78 82  Resp: 17 18  Temp: 98.4 F (36.9 C) (!) 97.5 F (36.4 C)   SpO2: 100% 100%   Vitals:   05/23/20 0902 05/23/20 1552 05/23/20 2123 05/24/20 0746  BP: 139/83 (!) 153/92 140/82 (!) 122/92  Pulse: 93 85 78 82  Resp: 19 19 17 18   Temp: 97.8 F (36.6 C) 98.6 F (37 C) 98.4 F (36.9 C) (!) 97.5 F (36.4 C)  TempSrc:   Oral   SpO2: 100% 100% 100% 100%    General: Pt is alert, awake, not in acute distress Cardiovascular: RRR, S1/S2 +, no rubs, no gallops Respiratory: CTA bilaterally, no wheezing, no rhonchi Abdominal: Soft, NT, ND, bowel sounds + Extremities: no edema, no cyanosis    The results of significant diagnostics from this hospitalization (including imaging, microbiology, ancillary and laboratory) are listed below for reference.     Microbiology: No results found for this or any previous visit (from the past 240 hour(s)).   Labs: BNP (last 3 results) No results for input(s): BNP in the last 8760 hours. Basic Metabolic Panel: Recent Labs  Lab 05/19/20 0444 05/23/20 0330  NA 138 139  K 4.2 3.9  CL 106 105  CO2 22 25  GLUCOSE 87 88  BUN 15 21*  CREATININE 1.19 1.24  CALCIUM 9.1 8.9   Liver Function Tests: Recent Labs  Lab 05/19/20 0444  AST 16  ALT 14  ALKPHOS 70  BILITOT 0.6  PROT 7.5  ALBUMIN 3.1*   No results for input(s): LIPASE, AMYLASE in the last 168 hours. No results for input(s): AMMONIA in the last 168 hours. CBC: Recent Labs  Lab 05/19/20 0444 05/21/20 0944 05/23/20 0330  WBC 6.2  --  6.1  NEUTROABS 4.0  --   --   HGB 10.8* 12.5* 10.9*  HCT 34.3* 40.7 34.5*  MCV 81.3  --  82.5  PLT 302  --  303   Cardiac Enzymes: No results for input(s): CKTOTAL, CKMB, CKMBINDEX, TROPONINI in the last 168 hours. BNP: Invalid input(s): POCBNP CBG: No results for input(s): GLUCAP in the last 168 hours. D-Dimer No results for input(s): DDIMER in the last 72 hours.  Hgb A1c No results for input(s): HGBA1C in the last 72 hours. Lipid Profile No results for input(s): CHOL, HDL, LDLCALC, TRIG, CHOLHDL,  LDLDIRECT in the last 72 hours. Thyroid function studies No results for input(s): TSH, T4TOTAL, T3FREE, THYROIDAB in the last 72 hours.  Invalid input(s): FREET3 Anemia work up No results for input(s): VITAMINB12, FOLATE, FERRITIN, TIBC, IRON, RETICCTPCT in the last 72 hours. Urinalysis    Component Value Date/Time   COLORURINE YELLOW 05/09/2020 1430   APPEARANCEUR CLEAR 05/09/2020 1430   LABSPEC 1.033 (H) 05/09/2020 1430   PHURINE 6.0 05/09/2020 1430   GLUCOSEU NEGATIVE 05/09/2020 1430   HGBUR MODERATE (A) 05/09/2020 1430   BILIRUBINUR NEGATIVE 05/09/2020 1430   BILIRUBINUR neg 05/30/2014 1538   KETONESUR NEGATIVE 05/09/2020 1430   PROTEINUR NEGATIVE 05/09/2020 1430   UROBILINOGEN >=8.0 05/30/2014 1538   NITRITE NEGATIVE 05/09/2020 1430   LEUKOCYTESUR NEGATIVE 05/09/2020 1430   Sepsis Labs Invalid input(s): PROCALCITONIN,  WBC,  LACTICIDVEN Microbiology No results found for this or any previous visit (from the past 240 hour(s)).    SIGNED:   Charlynne Cousins, MD  [facility] 05/24/2020, 8:11 AM Pager   If 7PM-7AM, please contact night-coverage [email or web address] Password [email or web address]

## 2020-05-24 NOTE — Progress Notes (Signed)
Physical Therapy Treatment Patient Details Name: Darren Pearson MRN: [id number] DOB: Jun 17, 1966 Today's Date: 05/24/2020    History of Present Illness Pt is 54 yo male who was found slumped by the side of his house and was brought to [facility].  Pt found to have small acute infarcts in the basal ganglia, left corona radiata, right occipital white matter, left parietal periventricular white matter, and bilateral frontal white matter.  Additionally, pt found to have hgb of 6.0 and has received PRBC.  Pt s/p lumbar puncture to evaluate for possible vasculitis, results pending.  Pt with PMH of previous CVA in 11/2019 with residual left sided weakness.    PT Comments    Pt perseverating on need to have BM, per NT this has been going on all shift so far. Pt ambulated 100 ft in hallway with HHA, pt frequently reaching for railing in hallway to steady self but PT challenged pt to ambulate without support to promote dynamic balance. Pt limited in gait distance due to need for BM. PT continuing to recommend rehab post-acutely, CIR denied pt so SNF level of care appropriate at this time. Will continue to follow.    Follow Up Recommendations  SNF;Supervision/Assistance - 24 hour     Equipment Recommendations  Rolling walker with 5" wheels    Recommendations for Other Services       Precautions / Restrictions Precautions Precautions: Fall Restrictions Weight Bearing Restrictions: No    Mobility  Bed Mobility Overal bed mobility: Needs Assistance Bed Mobility: Supine to Sit     Supine to sit: Supervision     General bed mobility comments: for safety, increased time  Transfers Overall transfer level: Needs assistance Equipment used: 1 person hand held assist Transfers: Sit to/from Stand Sit to Stand: Min assist         General transfer comment: min assist to steady, STS from toilet and EOB  Ambulation/Gait Ambulation/Gait assistance: Min assist Gait Distance (Feet): 100  Feet Assistive device: 1 person hand held assist Gait Pattern/deviations: Step-through pattern;Decreased stride length;Shuffle;Trunk flexed;Narrow base of support;Ataxic Gait velocity: decr   General Gait Details: Min assist to steady, x2 posterolateral LOB requiring PT intervention to correct. Verbal cuing for upright posture, increasing step length bilaterally. Pt limited in distance by need for BM, transported back to room with rolling chair.   Stairs             Wheelchair Mobility    Modified Rankin (Stroke Patients Only) Modified Rankin (Stroke Patients Only) Pre-Morbid Rankin Score: Moderately severe disability Modified Rankin: Moderately severe disability     Balance Overall balance assessment: Needs assistance Sitting-balance support: No upper extremity supported Sitting balance-Leahy Scale: Good     Standing balance support: Single extremity supported Standing balance-Leahy Scale: Fair Standing balance comment: ambulate without RW, requires intermittent assist from PT especially during challenges to gait                            Cognition Arousal/Alertness: Awake/alert Behavior During Therapy: Flat affect Overall Cognitive Status: Impaired/Different from baseline Area of Impairment: Attention;Memory;Following commands;Safety/judgement;Awareness;Problem solving                   Current Attention Level: Sustained Memory: Decreased short-term memory Following Commands: Follows one step commands with increased time Safety/Judgement: Decreased awareness of safety;Decreased awareness of deficits Awareness: Intellectual Problem Solving: Slow processing;Difficulty sequencing;Requires verbal cues;Requires tactile cues;Decreased initiation General Comments: Pt perseverating on toileting for BM,  little to no BM on 2x PT assisted pt to toilet      Exercises      General Comments        Pertinent Vitals/Pain Pain Assessment: No/denies pain     Home Living                      Prior Function            PT Goals (current goals can now be found in the care plan section) Acute Rehab PT Goals Patient Stated Goal: pt unable to state; sister agreeable to rehab PT Goal Formulation: With patient/family Time For Goal Achievement: 06/07/20 Potential to Achieve Goals: Fair    Frequency    Min 3X/week      PT Plan Current plan remains appropriate    Co-evaluation              AM-PAC PT "6 Clicks" Mobility   Outcome Measure  Help needed turning from your back to your side while in a flat bed without using bedrails?: A Little Help needed moving from lying on your back to sitting on the side of a flat bed without using bedrails?: A Little Help needed moving to and from a bed to a chair (including a wheelchair)?: A Little Help needed standing up from a chair using your arms (e.g., wheelchair or bedside chair)?: A Little Help needed to walk in hospital room?: A Little Help needed climbing 3-5 steps with a railing? : A Lot 6 Click Score: 17    End of Session Equipment Utilized During Treatment: Gait belt Activity Tolerance: Patient tolerated treatment well Patient left: with call bell/phone within reach;with nursing/sitter in room;Other (comment) (on toilet, NT in room) Nurse Communication: Mobility status PT Visit Diagnosis: Unsteadiness on feet (R26.81);Muscle weakness (generalized) (M62.81)     Time: 1042-1100 PT Time Calculation (min) (ACUTE ONLY): 18 min  Charges:  $Gait Training: 8-22 mins                    Makelle Marrone E, PT [place] Pager [phone]  Office [phone]    Inayah Woodin D Tisa Weisel 05/24/2020, 11:28 AM

## 2020-05-24 NOTE — TOC Progression Note (Addendum)
Transition of Care [facility]) - Progression Note    Patient Details  Name: EADEN HETTINGER MRN: [id number] Date of Birth: [birth year]  Transition of Care [facility]) CM/SW [place], [place] Phone Number: 05/24/2020, 1:48 PM  Clinical Narrative:     CSW confirmed with Freda Munro from [facility] that pt can come for bed. Freda Munro will start managed medicaid authorization.   CSW informed pt sister that pt is being discharge and that [place] is only facility that will accept. Sister is okay with [facility]. Sister to bring Covid Vaccine card to [place].   1509; Freda Munro from [facility] confirmed [place] still pending. CSW faxed copy of vaccination. Freda Munro informed CSW that no new covid test need if pt vaxed.  Expected Discharge Plan: [place] Barriers to Discharge: Continued Medical Work up, Inadequate or no insurance  Expected Discharge Plan and Services Expected Discharge Plan: [place] arrangements for the past 2 months: Single Family Home Expected Discharge Date: 05/24/20                                     Social Determinants of Health (SDOH) Interventions    Readmission Risk Interventions No flowsheet data found.

## 2020-05-25 NOTE — TOC Transition Note (Addendum)
Transition of Care [facility]) - CM/SW Discharge Note   Patient Details  Name: Darren Pearson MRN: [id number] Date of Birth: [birth year]  Transition of Care [facility]) CM/SW Contact:  Bethann Berkshire, [place] Phone Number: 05/25/2020, 12:12 PM   Clinical Narrative:      Patient will DC to: [place] Anticipated DC date: 05/25/20 Family notified: Glenetta Borg (Sister)  [phone] [facility]) Transport by: Corey Harold   Per MD patient ready for DC to [place], patient, patient's family, and facility notified of DC. Discharge Summary and FL2 sent to facility. RN to call report prior to discharge [phone] room [place]). DC packet on chart. Ambulance transport requested for patient.   CSW will sign off for now as social work intervention is no longer needed. Please consult [place] again if new needs arise.  Final next level of care: [place] Barriers to Discharge: No Barriers Identified   Patient Goals and CMS Choice        Discharge Placement              Patient chooses bed at: [facility] Patient to be transferred to facility by: [facility] [place] Name of family member notified: Glenetta Borg (Sister) [phone] [facility]) Patient and family notified of of transfer: 05/25/20  Discharge Plan and Services                                     Social Determinants of Health (SDOH) Interventions     Readmission Risk Interventions No flowsheet data found.

## 2020-05-25 NOTE — Progress Notes (Signed)
[facility] PROGRESS NOTE    Progress Note  [name]  OZH:[id number] DOB: 1965-12-12 DOA: 05/09/2020 PCP: [name], NP     Brief Narrative:   Darren Pearson is an 54 y.o. male with past medical history of hyperlipidemia CVA with left residual hemiparesis, hepatitis C comes in with 3 days of poor oral intake from the day of admission, his sister found him/in the couch and confused.  He has been not his normal self for the past 4 weeks not taking his medications regularly.  In the ED was borderline as a code stroke was seen by neurology initial CT scan of the brain showed no acute findings, was found to have a hemoglobin of 6 and a low MCV with a platelet count of 491, potassium of 3.3 and a creatinine of 2.4, he was type and screen and transfuse 3 unit of packed red blood cells MRI of the brain showed a small acute infarct probably an embolic phenomenon CTA showed moderate stenosis of the proximal right vertebral artery LP failed to demonstrate autoimmune process or vasculitis.  Neurology evaluated the patient and felt that the stroke was secondary to his anemia.  Gastroenterology was consulted and recommended endoscopy as an outpatient.  Patient evaluated by inpatient rehab and determined not appropriate for admission awaiting skilled nursing facility placement.  Assessment/Plan:   Acute  CVA (cerebral vascular accident) ([place]) Continue Lipitor and aspirin, no signs of overt bleeding. No changes overnight the patient had to stay in the hospital waiting for bed at a skilled nursing facility.  Symptomatic anemia: No signs of overt bleeding overnight hemoglobin is stable. Thought to be likely due to acute blood loss in the setting of chronic anemia. She has been using NSAIDs at home.  Acute kidney injury: With a baseline creatinine of around one on admission 2.4, likely prerenal resolved after fluid resuscitation and blood transfusions.  Electrolyte imbalance  hypokalemia/hypomagnesemia: Repleted orally now resolved.  Prolonged QTC: On 511 admission after potassium and magnesium supplementation is improved.  Chronic systolic heart failure: With an EF of 45% on 2020 appears euvolemic.  History of hepatitis C: Clinical picture was not consistent with hepatitis encephalopathy.  Essential hypertension: Continue Norvasc.  Bedbugs/findings most concerning for scabies: Currently on permethrin cream.  Ambulatory dysfunction: Hemiparesis from an old CVA and right-sided weakness, with ataxia awaiting skilled nursing facility placement.    DVT prophylaxis: scd Family Communication:none Status is: Inpatient  Remains inpatient appropriate because:Patient is currently medically stable awaiting for skilled nursing facility bed.   Dispo:  Patient From: Home  Planned Disposition: [place]  Expected discharge date: 05/25/20  Medically stable for discharge: Yes   Code Status:     Code Status Orders  (From admission, onward)         Start     Ordered   05/09/20 1523  Full code  Continuous        05/09/20 1524        Code Status History    Date Active Date Inactive Code Status Order ID Comments User Context   11/20/2019 1804 11/23/2019 1856 Full Code [id number]  Lavina Hamman, MD ED   Advance Care Planning Activity        IV Access:    Peripheral IV   Procedures and diagnostic studies:   No results found.   Medical Consultants:    None.  Anti-Infectives:   none  Subjective:    Darren Pearson awake no complaints this  morning.  Objective:    Vitals:   05/24/20 0746 05/24/20 1657 05/24/20 2157 05/25/20 0756  BP: (!) 122/92 128/85 130/78 138/88  Pulse: 82 88 75 85  Resp: 18 17 18 17   Temp: (!) 97.5 F (36.4 C) 98.3 F (36.8 C) 98.2 F (36.8 C) 98.1 F (36.7 C)  TempSrc:   Oral   SpO2: 100% 99% 98% 100%   SpO2: 100 %   Intake/Output Summary (Last 24 hours) at 05/25/2020  0840 Last data filed at 05/24/2020 1945 Gross per 24 hour  Intake --  Output 1000 ml  Net -1000 ml   There were no vitals filed for this visit.  Exam: General exam: In no acute distress. Respiratory system: Good air movement and clear to auscultation. Cardiovascular system: S1 & S2 heard, RRR. No JVD. Gastrointestinal system: Abdomen is nondistended, soft and nontender.  Extremities: No pedal edema. Skin: No rashes, lesions or ulcers  Data Reviewed:    Labs: Basic Metabolic Panel: Recent Labs  Lab 05/19/20 0444 05/23/20 0330  NA 138 139  K 4.2 3.9  CL 106 105  CO2 22 25  GLUCOSE 87 88  BUN 15 21*  CREATININE 1.19 1.24  CALCIUM 9.1 8.9   GFR CrCl cannot be calculated (Unknown ideal weight.). Liver Function Tests: Recent Labs  Lab 05/19/20 0444  AST 16  ALT 14  ALKPHOS 70  BILITOT 0.6  PROT 7.5  ALBUMIN 3.1*   No results for input(s): LIPASE, AMYLASE in the last 168 hours. No results for input(s): AMMONIA in the last 168 hours. Coagulation profile No results for input(s): INR, PROTIME in the last 168 hours. COVID-19 Labs  No results for input(s): DDIMER, FERRITIN, LDH, CRP in the last 72 hours.  Lab Results  Component Value Date   SARSCOV2NAA NEGATIVE 05/09/2020   [place] NEGATIVE 11/20/2019    CBC: Recent Labs  Lab 05/19/20 0444 05/21/20 0944 05/23/20 0330  WBC 6.2  --  6.1  NEUTROABS 4.0  --   --   HGB 10.8* 12.5* 10.9*  HCT 34.3* 40.7 34.5*  MCV 81.3  --  82.5  PLT 302  --  303   Cardiac Enzymes: No results for input(s): CKTOTAL, CKMB, CKMBINDEX, TROPONINI in the last 168 hours. BNP (last 3 results) No results for input(s): PROBNP in the last 8760 hours. CBG: No results for input(s): GLUCAP in the last 168 hours. D-Dimer: No results for input(s): DDIMER in the last 72 hours. Hgb A1c: No results for input(s): HGBA1C in the last 72 hours. Lipid Profile: No results for input(s): CHOL, HDL, LDLCALC, TRIG, CHOLHDL, LDLDIRECT in the  last 72 hours. Thyroid function studies: No results for input(s): TSH, T4TOTAL, T3FREE, THYROIDAB in the last 72 hours.  Invalid input(s): FREET3 Anemia work up: No results for input(s): VITAMINB12, FOLATE, FERRITIN, TIBC, IRON, RETICCTPCT in the last 72 hours. Sepsis Labs: Recent Labs  Lab 05/19/20 0444 05/23/20 0330  WBC 6.2 6.1   Microbiology No results found for this or any previous visit (from the past 240 hour(s)).   Medications:   . sodium chloride   Intravenous Once  . amLODipine  10 mg Oral Daily  . aspirin EC  81 mg Oral Daily  . atorvastatin  40 mg Oral q1800  . lidocaine (PF)  5 mL Intradermal Once  . permethrin  1 application Topical Once  . sodium chloride flush  3 mL Intravenous Q12H  . cyanocobalamin  1,000 mcg Oral Daily   Continuous Infusions:  LOS: 16 days   Charlynne Cousins  [facility]  05/25/2020, 8:40 AM

## 2020-05-31 ENCOUNTER — Encounter: Payer: Self-pay | Admitting: Physician Assistant

## 2020-05-31 ENCOUNTER — Telehealth: Payer: Self-pay | Admitting: *Deleted

## 2020-05-31 ENCOUNTER — Telehealth (INDEPENDENT_AMBULATORY_CARE_PROVIDER_SITE_OTHER): Payer: Self-pay

## 2020-05-31 NOTE — Telephone Encounter (Signed)
Open in error

## 2020-05-31 NOTE — Telephone Encounter (Signed)
Attempted to contact pt to complete transition of care assessment; left message on voicemail.  Katrice Ilias Stcharles, RN, BSN, CCRN [facility] [phone]  

## 2020-06-27 ENCOUNTER — Encounter: Payer: Self-pay | Admitting: Adult Health

## 2020-06-27 ENCOUNTER — Other Ambulatory Visit: Payer: Self-pay

## 2020-06-27 ENCOUNTER — Ambulatory Visit: Payer: Medicaid Other | Admitting: Adult Health

## 2020-06-27 VITALS — BP 147/93 | HR 100 | Wt 153.0 lb

## 2020-06-27 DIAGNOSIS — R269 Unspecified abnormalities of gait and mobility: Secondary | ICD-10-CM

## 2020-06-27 DIAGNOSIS — I69319 Unspecified symptoms and signs involving cognitive functions following cerebral infarction: Secondary | ICD-10-CM | POA: Diagnosis not present

## 2020-06-27 DIAGNOSIS — Z8673 Personal history of transient ischemic attack (TIA), and cerebral infarction without residual deficits: Secondary | ICD-10-CM

## 2020-06-27 DIAGNOSIS — E785 Hyperlipidemia, unspecified: Secondary | ICD-10-CM

## 2020-06-27 DIAGNOSIS — I10 Essential (primary) hypertension: Secondary | ICD-10-CM | POA: Diagnosis not present

## 2020-06-27 DIAGNOSIS — I69398 Other sequelae of cerebral infarction: Secondary | ICD-10-CM

## 2020-06-27 NOTE — Progress Notes (Signed)
[facility] [address] [place]. [place] [address] [phone]       OFFICE FOLLOW UP NOTE  [name] Date of Birth:  January 29, 1966 Medical Record Number:  [id number]   Referring MD:  Rosalin Hawking Reason for Referral: Stroke  Chief complaint: Chief Complaint  Patient presents with  . Follow-up    corner rm here for a f/u from a stoke. Pt is havig no new sx      HPI:  Today, 06/27/2020, [name] returns for stroke follow-up as well as recent admission for additional stroke.  Presented to ED on 05/09/2020 after being found down with aphasia and verbal unresponsiveness with stroke work-up revealing small/punctate bilateral multifocal cerebral infarct, embolic pattern, likely due to severe anemia in the setting of advanced small vessel ischemia.  Previously on aspirin placed on hold during admission due to severe anemia and recommended restarting once safe.  Prior stroke history 10/2019 as provided below.  Cognitive impairment over the past year worsened over the past 6 months and recent behavioral changes likely vascular dementia exacerbated by severe anemia.  Required transfusion for symptomatic anemia and recommended GI follow-up outpatient for possible colonoscopy.  HTN stable.  LDL 52 noncompliant with atorvastatin and recommended initiating atorvastatin 10 mg daily.  Other stroke risk factors include former tobacco use, history of EtOH use and history of substance abuse (THC and cocaine).  Other active problems include B12 deficiency, bedbugs likely scabies, history of hep C, hypokalemia and prolonged QT.  Evaluated by therapy and discharged to SNF in stable condition.  Stroke: Small/punctate B multifocal cerebral infarct, embolic pattern, likely due to severe anemia in the setting of advanced small vessel ischemia.  Code Stroke CT head No acute abnormality. Moderate Small vessel disease. Moderate Atrophy. ASPECTS 10.     CTA head & neck moderate proximal R VA  stenosis. Stable 2 mm L para clinoid ICA aneurysm   CT perfusion no core or penumbra  MRI  Small punctate B cerebral infarcts -left basal ganglia, left corona radiata, right occipital white matter, left parietal periventricular white matter and bilateral frontal white matter. Advanced small vessel disease.   2D Echo EF 50-55%  LDL 52  HgbA1c 5.3   UDS neg  Loop recorder interrogation no evidence of atrial fibrillation  CSF WBC 0, RBC 2, protein 36, glu 69 - no evidence of CNS vasculitis  SCDs for VTE prophylaxis  aspirin 81 mg daily prior to admission, aspirin 81 mg was currently on hold due to severe anemia. OK to resume when feel safe.  Initiated aspirin at discharge  Therapy recommendations:  SNF   Disposition:   [facility]   Since discharge, he continues to reside at [facility] and accompanied by facility caregiver.  He reports he has done well without residual deficits and continues to work with physical, occupational and speech therapy.  Caregiver does report ongoing difficulty with imbalance, gait impairment and cognitive impairment.  Currently using rolling walker for ambulation without recent falls.  Short-term memory loss with confusion but no behavioral concerns.  Continues on aspirin 81 mg daily and atorvastatin 40 mg daily without side effects.  Blood pressure 147/93.  No documented reports on loop recorder since March and caregiver does not believe monitoring device is at facility.  No concerns at this time.      History provided for reference purposes only Initial visit 01/31/2020 Dr. Leonie Man: Darren Pearson is a 54 year old African-American male seen today for initial office  consultation visit following hospital admission at [facility] for stroke in December 2020. History is obtained from the patient, his sister is accompanying her as well as review of electronic medical records and I personally reviewed imaging films in PACS. He is a 54 year old male with past  history of hypertension, hyperlipidemia, hepatitis C who presented to [facility] on 11/20/2019 with 1-1/2-week history of having had 3 falls. He denied any focal weakness at that time but CT scan showed diffuse cerebral atrophy and bilateral changes of white matter disease and left pontine remote age lacunar infarct. MRI scan showed acute and subacute punctate bilateral cortical as well as left corona radiata as well as right medial pontine infarcts and remote age lacunar infarcts in the right caudate head, right cerebellum. CT angiogram of the head and neck did not show any large vessel occlusion but showed only a small left paraclinoid ICA aneurysm. Lower extremity venous Dopplers were negative for DVT. 2D echo showed slightly diminished ejection fraction of 45 to 50% but transesophageal echo was negative for intracardiac thrombus and there was no PFO. LDL cholesterol was 74 mg percent hemoglobin A1c 4.2. Urine drug screen was negative. HIV was negative. Hypercoagulable panel labs were negative. ANA was negative. Vitamin B12 was low at 170 and TSH was normal. Patient had loop recorder placed on 11/22/2019 and so for paroxysmal A. fib has not been found. Patient had only mild left-sided weakness and incoordination. He states that this is remain. His has trouble with walking and balance. [name] she is almost fallen a few times but managed to catch himself. Does use a cane. Is had no recurrent stroke or TIA symptoms. The patient continues to smoke but knows that he needs to quit smoking. He took aspirin Plavix for 3 weeks and is now stopped Plavix and is on aspirin alone which is tolerating well without bruising or bleeding. He states his blood pressure is usually better controlled but today it is elevated at 160/96. He is tolerating Lipitor well without muscle aches and pains. He denies any prior history of strokes or TIAs but MRI scan clearly showed prior lacunar infarcts. He denies family history of  strokes or TIAs     ROS:   14 system review of systems is positive for weakness, gait difficulty, imbalance, cognitive impairment and all other systems negative  PMH:  Past Medical History:  Diagnosis Date  . Allergy   . Anxiety and depression   . Asthma   . Hepatitis C   . History of chicken pox   . Hypertension     Social History:  Social History   Socioeconomic History  . Marital status: Single    Spouse name: Not on file  . Number of children: Not on file  . Years of education: 34  . Highest education level: Not on file  Occupational History  . Occupation: Programmer, applications: [employer]  Tobacco Use  . Smoking status: Former Smoker    Packs/day: 0.25    Types: Cigarettes    Start date: 11/24/2012  . Smokeless tobacco: Never Used  . Tobacco comment: has not smoked since 11/20/2019  Vaping Use  . Vaping Use: Former  Substance and Sexual Activity  . Alcohol use: Not Currently    Alcohol/week: 2.0 standard drinks    Types: 2 Cans of beer per week    Comment: social, states stopped on 11/20/2019.  Marland Kitchen Drug use: Not Currently    Frequency: 5.0  times per week    Types: Marijuana    Comment: Stop 11/20/2019  . Sexual activity: Yes    Birth control/protection: Condom  Other Topics Concern  . Not on file  Social History Narrative   Regular exercise-yes   Social Determinants of Health   Financial Resource Strain:   . Difficulty of Paying Living Expenses:   Food Insecurity:   . Worried About Charity fundraiser in the Last Year:   . Arboriculturist in the Last Year:   Transportation Needs: No Transportation Needs  . Lack of Transportation (Medical): No  . Lack of Transportation (Non-Medical): No  Physical Activity:   . Days of Exercise per Week:   . Minutes of Exercise per Session:   Stress:   . Feeling of Stress :   Social Connections:   . Frequency of Communication with Friends and Family:   . Frequency of Social Gatherings  with Friends and Family:   . Attends Religious Services:   . Active Member of Clubs or Organizations:   . Attends Archivist Meetings:   Marland Kitchen Marital Status:   Intimate Partner Violence:   . Fear of Current or Ex-Partner:   . Emotionally Abused:   Marland Kitchen Physically Abused:   . Sexually Abused:     Medications:   Current Outpatient Medications on File Prior to Visit  Medication Sig Dispense Refill  . acetaminophen (TYLENOL) 325 MG tablet Take 650 mg by mouth every 6 (six) hours as needed for mild pain or headache.    Marland Kitchen aspirin 81 MG EC tablet Take 1 tablet (81 mg total) by mouth daily. 90 tablet 0  . atorvastatin (LIPITOR) 40 MG tablet Take 1 tablet (40 mg total) by mouth daily at 6 PM. 90 tablet 0  . cyanocobalamin 1000 MCG tablet Take 1 tablet (1,000 mcg total) by mouth daily. 90 tablet 0  . lisinopril (ZESTRIL) 20 MG tablet Take 1 tablet (20 mg total) by mouth daily. 30 tablet 11   No current facility-administered medications on file prior to visit.    Allergies:   Allergies  Allergen Reactions  . Sulfa Antibiotics Hives    Physical Exam  Today's Vitals   06/27/20 0956 06/27/20 0957  BP: (!) 148/98 (!) 147/93  Pulse: (!) 106 100  Weight:  153 lb (69.4 kg)   Body mass index is 22.59 kg/m.    General: Frail pleasant elderly appearing African-American male, seated, in no evident distress Head: head normocephalic and atraumatic.   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  Fluent speech and language.  Disoriented to place and time.  Recent and remote memory impaired. Attention span, concentration and fund of knowledge impaired. Mood and affect appropriate.  Cranial Nerves: Pupils  equal, briskly reactive to light. Extraocular movements full without nystagmus. Visual fields full to confrontation. Hearing intact. Facial sensation intact. Mild left lower facial weakness., tongue, palate moves normally and symmetrically.  Motor: Full strength right upper and lower extremity.  Mild LUE weakness and decreased hand dexterity.  Unable to appreciate weakness LLE but does have increased tone Sensory.: intact to touch , pinprick , position and vibratory sensation.  Coordination: Slightly impaired left finger-to-nose and neutral coordination. Normal on the right. Gait and  Station: Arises from chair with mild difficulty. Walks with stiffness and circumduction of the left leg and mild imbalance with use of rolling walker.  Tandem walk not attempted. Reflexes: 1+ and symmetric. Toes downgoing.      ASSESSMENT/PLAN: 54 year old African-American male with multiple by cerebral cortical and subcortical infarcts likely of mixed cryptogenic etiology and small vessel disease with placement of loop recorder in December 2020 and small/punctate bilateral multifocal cerebellar infarcts likely due to severe anemia in setting of advanced small vessel ischemia in June 2021. Multiple vascular risk factors of smoking, hypertension, hyperlipidemia, peripheral arterial disease, prior strokes, history of substance abuse, B12 deficiency and anemia    History of multiple strokes -Residual deficits: Mild LUE weakness, gait impairment, imbalance and moderate to severe cognitive impairment likely vascular dementia -Currently residing at SNF -continue PT/OT/SLP -Loop recorder interrogated during recent admission without evidence of atrial fibrillation.  Requested facility reach out to sister in order to obtain bedside monitoring device for ongoing monitoring -Continue aspirin 81 mg daily and atorvastatin for secondary stroke prevention -maintain strict control of hypertension with blood pressure goal below 130/90  and lipids with LDL cholesterol goal below 70 mg/dL. I also advised the patient to eat a healthy diet with plenty of whole grains, cereals, fruits and vegetables, exercise regularly and maintain ideal body weight    Follow-up in 4 months or call earlier if needed   I spent 40 minutes of face-to-face and non-face-to-face time with patient and caregiver.  This included previsit chart review, lab review, study review, order entry, electronic health record documentation, patient education regarding history of prior strokes and recent stroke with residual deficits, importance of managing stroke risk factors and monitoring of loop recorder and answered all questions to patient and caregiver satisfaction   Frann Rider, AGNP-BC  [facility] [address] [place],  [address]  Phone [phone] Fax [phone] Note: This document was prepared with digital dictation and possible smart phrase technology. Any transcriptional errors that result from this process are unintentional.

## 2020-06-27 NOTE — Patient Instructions (Signed)
Residual deficits of mild left hemiparesis, gait impairment and moderate to severe cognitive impairment.  Recommend continued participation with physical, occupational and speech therapy  Loop recorder previously placed due to cryptogenic stroke etiology -please attempt to contact sister to bring monitoring device to facility in order to continue to monitor for atrial fibrillation.  If further questions or concerns, please contact cardiology  Continue aspirin 81 mg daily  and atorvastatin for secondary stroke prevention  Continue to follow up with PCP regarding cholesterol and blood pressure management  Maintain strict control of hypertension with blood pressure goal below 130/90 and cholesterol with LDL cholesterol (bad cholesterol) goal below 70 mg/dL.       Followup in the future with me in 4 months or call earlier if needed       Thank you for coming to see [place] at [facility]. I hope we have been able to provide you high quality care today.  You may receive a patient satisfaction survey over the next few weeks. We would appreciate your feedback and comments so that we may continue to improve ourselves and the health of our patients.

## 2020-07-04 ENCOUNTER — Encounter: Payer: Self-pay | Admitting: Physician Assistant

## 2020-07-04 ENCOUNTER — Other Ambulatory Visit (INDEPENDENT_AMBULATORY_CARE_PROVIDER_SITE_OTHER): Payer: Medicaid Other

## 2020-07-04 ENCOUNTER — Ambulatory Visit (INDEPENDENT_AMBULATORY_CARE_PROVIDER_SITE_OTHER): Payer: Medicaid Other | Admitting: Physician Assistant

## 2020-07-04 VITALS — BP 140/86 | HR 90 | Ht 69.0 in | Wt 152.6 lb

## 2020-07-04 DIAGNOSIS — D649 Anemia, unspecified: Secondary | ICD-10-CM

## 2020-07-04 LAB — CBC WITH DIFFERENTIAL/PLATELET
Basophils Absolute: 0 10*3/uL (ref 0.0–0.1)
Basophils Relative: 0.6 % (ref 0.0–3.0)
Eosinophils Absolute: 0.3 10*3/uL (ref 0.0–0.7)
Eosinophils Relative: 5.1 % — ABNORMAL HIGH (ref 0.0–5.0)
HCT: 37.2 % — ABNORMAL LOW (ref 39.0–52.0)
Hemoglobin: 12.5 g/dL — ABNORMAL LOW (ref 13.0–17.0)
Lymphocytes Relative: 31.5 % (ref 12.0–46.0)
Lymphs Abs: 1.8 10*3/uL (ref 0.7–4.0)
MCHC: 33.6 g/dL (ref 30.0–36.0)
MCV: 84.4 fl (ref 78.0–100.0)
Monocytes Absolute: 0.5 10*3/uL (ref 0.1–1.0)
Monocytes Relative: 9.4 % (ref 3.0–12.0)
Neutro Abs: 3.1 10*3/uL (ref 1.4–7.7)
Neutrophils Relative %: 53.4 % (ref 43.0–77.0)
Platelets: 243 10*3/uL (ref 150.0–400.0)
RBC: 4.4 Mil/uL (ref 4.22–5.81)
RDW: 24.9 % — ABNORMAL HIGH (ref 11.5–15.5)
WBC: 5.8 10*3/uL (ref 4.0–10.5)

## 2020-07-04 LAB — IBC + FERRITIN
Ferritin: 19.3 ng/mL — ABNORMAL LOW (ref 22.0–322.0)
Iron: 72 ug/dL (ref 42–165)
Saturation Ratios: 20.7 % (ref 20.0–50.0)
Transferrin: 248 mg/dL (ref 212.0–360.0)

## 2020-07-04 NOTE — Patient Instructions (Signed)
Your provider has requested that you go to the basement level for lab work before leaving today. Press "B" on the elevator. The lab is located at the first door on the left as you exit the elevator.  

## 2020-07-04 NOTE — Progress Notes (Signed)
Chief Complaint: Follow-up hospitalization with anemia  HPI:    Mr. Rafanan is a 54 year old African-American male from SNF, with a past medical history as listed below including CVA, prolonged QTC, history of hepatitis C, chronic systolic heart failure (5/91/6384 echo with abnormal septal motion.  LVEF 50-55%) and others, previously assigned to Dr. Fuller Plan, who was referred to me by [name], NP for follow-up after being seen in the hospital with anemia.      05/09/2020-05/25/2020 patient admitted to the hospital for a CVA.  That time discussed he had history of prior CVA with left residual hemiparesis as well as hepatitis C.  He came to the ED with 3 days of portal intake and was found on the couch confused.  Is found to have a hemoglobin of 6 and a low MCV with a platelet count of 491 potassium 3.3 and creatinine 2.4.  Typed and screened and transfused 3 units of PRBCs.  MRI of the brain showed a small acute infarct probably an embolic phenomenon.  CTA showed moderate stenosis of the proximal right vertebral artery.  LP failed to demonstrate autoimmune process or vasculitis.  Neurology evaluated the patient felt a stroke secondary to his anemia.  Apparently patient had been using NSAIDs.  It was recommended he follow-up with [place] as an outpatient.  He was Hemoccult negative on 6/16.    05/23/2020 CBC with a hemoglobin of 10.9.    Today, the patient presents to clinic accompanied by his sister who does assist with his history slightly.  They are both really not aware of why he is here in clinic.  Patient tells me that he has been doing well.  He does not notice any black stools or blood in his stools.  Denies having any previous endoscopic work-up.  He has not had any blood work since leaving the hospital.    Denies fever, chills, weight loss, change in bowel habits, abdominal pain or symptoms that awaken him from sleep.  Past Medical History:  Diagnosis Date  . Allergy   . Anxiety and depression    . Asthma   . Hepatitis C   . History of chicken pox   . Hypertension     Past Surgical History:  Procedure Laterality Date  . BUBBLE STUDY  11/22/2019   Procedure: BUBBLE STUDY;  Surgeon: Dorothy Spark, MD;  Location: [place];  Service: Cardiovascular;;  . LOOP RECORDER INSERTION N/A 11/22/2019   Procedure: LOOP RECORDER INSERTION;  Surgeon: Thompson Grayer, MD;  Location: [place] CV LAB;  Service: Cardiovascular;  Laterality: N/A;  . TEE WITHOUT CARDIOVERSION N/A 11/22/2019   Procedure: TRANSESOPHAGEAL ECHOCARDIOGRAM (TEE);  Surgeon: Dorothy Spark, MD;  Location: [facility] ENDOSCOPY;  Service: Cardiovascular;  Laterality: N/A;    Current Outpatient Medications  Medication Sig Dispense Refill  . acetaminophen (TYLENOL) 325 MG tablet Take 650 mg by mouth every 6 (six) hours as needed for mild pain or headache.    Marland Kitchen aspirin 81 MG EC tablet Take 1 tablet (81 mg total) by mouth daily. 90 tablet 0  . atorvastatin (LIPITOR) 40 MG tablet Take 1 tablet (40 mg total) by mouth daily at 6 PM. 90 tablet 0  . cyanocobalamin 1000 MCG tablet Take 1 tablet (1,000 mcg total) by mouth daily. 90 tablet 0  . lisinopril (ZESTRIL) 20 MG tablet Take 1 tablet (20 mg total) by mouth daily. 30 tablet 11   No current facility-administered medications for this visit.    Allergies as  of 07/04/2020 - Review Complete 07/04/2020  Allergen Reaction Noted  . Sulfa antibiotics Hives 12/16/2012    Family History  Problem Relation Age of Onset  . Stomach cancer Mother   . Diabetes Father   . Paget's disease of bone Father     Social History   Socioeconomic History  . Marital status: Single    Spouse name: Not on file  . Number of children: Not on file  . Years of education: 65  . Highest education level: Not on file  Occupational History  . Occupation: Programmer, applications: [employer]  Tobacco Use  . Smoking status: Former Smoker    Packs/day: 0.25    Types:  Cigarettes    Start date: 11/24/2012  . Smokeless tobacco: Never Used  . Tobacco comment: has not smoked since 11/20/2019  Vaping Use  . Vaping Use: Former  Substance and Sexual Activity  . Alcohol use: Not Currently    Alcohol/week: 2.0 standard drinks    Types: 2 Cans of beer per week    Comment: social, states stopped on 11/20/2019.  Marland Kitchen Drug use: Not Currently    Frequency: 5.0 times per week    Types: Marijuana    Comment: Stop 11/20/2019  . Sexual activity: Yes    Birth control/protection: Condom  Other Topics Concern  . Not on file  Social History Narrative   Regular exercise-yes   Social Determinants of Health   Financial Resource Strain:   . Difficulty of Paying Living Expenses:   Food Insecurity:   . Worried About Charity fundraiser in the Last Year:   . Arboriculturist in the Last Year:   Transportation Needs: No Transportation Needs  . Lack of Transportation (Medical): No  . Lack of Transportation (Non-Medical): No  Physical Activity:   . Days of Exercise per Week:   . Minutes of Exercise per Session:   Stress:   . Feeling of Stress :   Social Connections:   . Frequency of Communication with Friends and Family:   . Frequency of Social Gatherings with Friends and Family:   . Attends Religious Services:   . Active Member of Clubs or Organizations:   . Attends Archivist Meetings:   Marland Kitchen Marital Status:   Intimate Partner Violence:   . Fear of Current or Ex-Partner:   . Emotionally Abused:   Marland Kitchen Physically Abused:   . Sexually Abused:     Review of Systems:    Constitutional: No weight loss, fever or chills Skin: No rash  Cardiovascular: No chest pain Respiratory: No SOB  Gastrointestinal: See HPI and otherwise negative Genitourinary: No dysuria  Neurological: No headache Musculoskeletal: No new muscle or joint pain Hematologic: No bleeding  Psychiatric: No history of depression or anxiety   Physical Exam:  Vital signs: BP 140/86   Pulse 90    Ht 5\' 9"  (1.753 m)   Wt 152 lb 9.6 oz (69.2 kg)   SpO2 96%   BMI 22.54 kg/m   Constitutional:   Pleasant AA male appears to be in NAD, Well developed, Well nourished, alert and cooperative Head:  Normocephalic and atraumatic. Eyes:   PEERL, EOMI. No icterus. Conjunctiva pink. Ears:  Normal auditory acuity. Neck:  Supple Throat: Oral cavity and pharynx without inflammation, swelling or lesion.  Respiratory: Respirations even and unlabored. Lungs clear to auscultation bilaterally.   No wheezes, crackles, or rhonchi.  Cardiovascular: Normal S1, S2. No MRG. Regular rate  and rhythm. No peripheral edema, cyanosis or pallor.  Gastrointestinal:  Soft, nondistended, nontender. No rebound or guarding. Normal bowel sounds. No appreciable masses or hepatomegaly. Rectal:  Not performed.  Msk:  Symmetrical without gross deformities. Without edema, no deformity or joint abnormality.  Neurologic:  Alert and  oriented x4;  grossly normal neurologically. Some Left sided residual deficits from stroke Skin:   Dry and intact without significant lesions or rashes. Psychiatric:  Demonstrates good judgement and reason without abnormal affect or behaviors.  RELEVANT LABS AND IMAGING: CBC    Component Value Date/Time   WBC 6.1 05/23/2020 0330   RBC 4.18 (L) 05/23/2020 0330   HGB 10.9 (L) 05/23/2020 0330   HCT 34.5 (L) 05/23/2020 0330   PLT 303 05/23/2020 0330   MCV 82.5 05/23/2020 0330   MCH 26.1 05/23/2020 0330   MCHC 31.6 05/23/2020 0330   RDW 20.7 (H) 05/23/2020 0330   LYMPHSABS 1.4 05/19/2020 0444   MONOABS 0.4 05/19/2020 0444   EOSABS 0.4 05/19/2020 0444   BASOSABS 0.1 05/19/2020 0444    CMP     Component Value Date/Time   NA 139 05/23/2020 0330   K 3.9 05/23/2020 0330   CL 105 05/23/2020 0330   CO2 25 05/23/2020 0330   GLUCOSE 88 05/23/2020 0330   BUN 21 (H) 05/23/2020 0330   CREATININE 1.24 05/23/2020 0330   CREATININE 0.91 05/30/2014 1529   CALCIUM 8.9 05/23/2020 0330   PROT 7.5  05/19/2020 0444   ALBUMIN 3.1 (L) 05/19/2020 0444   AST 16 05/19/2020 0444   ALT 14 05/19/2020 0444   ALKPHOS 70 05/19/2020 0444   BILITOT 0.6 05/19/2020 0444   GFRNONAA >60 05/23/2020 0330   GFRAA >60 05/23/2020 0330    Assessment: 1.  Anemia: Hemoglobin 6 at time of admission in the ER early June for a stroke, Hemoccult negative; consider GI source of blood loss versus other 2.  Recent CVA: Recent admission 6/16-7/2 for stroke  Plan: 1.  Discussed that the patient will need an EGD and colonoscopy.  Timing of this dependent on labs that we will do today. 2.  Ordered a CBC as well as iron studies today.  If patient's hemoglobin remained stable after his blood transfusion in the hospital then preferably would like to wait 3 months out from his stroke so that we can do these procedures in the outpatient setting.  If it has continued to drop then will need to arrange for procedures in the hospital. 3.  Patient will await recommendations pending labs today.  (Patient previously missed an appointment with Dr. Fuller Plan, we will keep him with him)  Ellouise Newer, PA-C [facility] 07/04/2020, 10:26 AM  Cc: [name], NP

## 2020-07-05 ENCOUNTER — Ambulatory Visit (INDEPENDENT_AMBULATORY_CARE_PROVIDER_SITE_OTHER): Payer: Medicaid Other | Admitting: *Deleted

## 2020-07-05 DIAGNOSIS — I6340 Cerebral infarction due to embolism of unspecified cerebral artery: Secondary | ICD-10-CM

## 2020-07-08 LAB — CUP PACEART REMOTE DEVICE CHECK
Date Time Interrogation Session: 20210813230608
Implantable Pulse Generator Implant Date: 20201229

## 2020-07-09 NOTE — Progress Notes (Signed)
Carelink Summary Report / Loop Recorder 

## 2020-07-22 NOTE — Progress Notes (Signed)
____________________________________________________________  Attending physician addendum:  Thank you for sending this case to me. I have reviewed the entire note, and the outlined plan seems appropriate.  His hemoglobin and iron studies have normalized.  That, along with reportedly being heme negative, mean we can wait until patient is 3 months out from CVA (early October, 3 months out from hospital discharge).  At that point, he might be appropriate to have those done in the [facility] if he is stable.  Please arrange a clinic follow up with you in about 4 weeks.  By then he will have seen Cardiology again.  Wilfrid Lund, MD  ____________________________________________________________

## 2020-07-23 ENCOUNTER — Telehealth: Payer: Self-pay

## 2020-07-23 NOTE — Telephone Encounter (Signed)
-----   Message from [place], [place] sent at 07/23/2020  8:22 AM EDT ----- Can you set follow up with me in 4 weeks?  Thanks-JLL  ----- Message ----- From: Doran Stabler, MD Sent: 07/22/2020   8:14 AM EDT To: Levin Erp, PA    ----- Message ----- From: Levin Erp, [place] Sent: 07/17/2020   9:06 AM EDT To: Doran Stabler, MD  Dr. Loletha Carrow you were supervising this day and Dr. Fuller Plan does not want patient.  Would you mind signing note and accepting patient.  THanks-JLL ----- Message ----- From: Ladene Artist, MD Sent: 07/13/2020   1:18 PM EDT To: Levin Erp, PA  This is not my patient. Should be supervising MD on the day of appt.  ----- Message ----- From: Levin Erp, PA Sent: 07/04/2020  10:58 AM EDT To: Ladene Artist, MD

## 2020-07-23 NOTE — Telephone Encounter (Signed)
Can you see if he can see another APP?  Thanks-JLL

## 2020-07-23 NOTE — Telephone Encounter (Signed)
Your first available is OCT 14, is that ok?

## 2020-07-23 NOTE — Telephone Encounter (Signed)
OCT 4 with Darren Pearson is the first available. I went ahead and scheduled to keep the spot.

## 2020-08-06 ENCOUNTER — Ambulatory Visit (INDEPENDENT_AMBULATORY_CARE_PROVIDER_SITE_OTHER): Payer: Medicaid Other | Admitting: *Deleted

## 2020-08-06 DIAGNOSIS — I639 Cerebral infarction, unspecified: Secondary | ICD-10-CM

## 2020-08-09 LAB — CUP PACEART REMOTE DEVICE CHECK
Date Time Interrogation Session: 20210915230419
Implantable Pulse Generator Implant Date: 20201229

## 2020-08-13 ENCOUNTER — Telehealth: Payer: Self-pay

## 2020-08-13 NOTE — Telephone Encounter (Signed)
-----   Message from Timothy Lasso, RN sent at 07/05/2020 11:57 AM EDT ----- Hemoglobin remaining stable and is even increase since time of discharge. Due to this will wait 3 months from his stroke and schedule him for an outpatient EGD and colonoscopy with Dr. Fuller Plan.   Ragan Reale can you please put in orders for repeat hemoglobin in 2 months. Colonoscopy and EGD will need to be scheduled somewhere around the end of September, after the 16th. This would be with Dr. Fuller Plan in the [facility]. Please  Arrange.

## 2020-08-13 NOTE — Progress Notes (Signed)
Carelink Summary Report / Loop Recorder 

## 2020-08-13 NOTE — Telephone Encounter (Signed)
See note regarding Dr Loletha Carrow taking on the pt.  Dr Fuller Plan declines.

## 2020-08-13 NOTE — Telephone Encounter (Signed)
The pt will keep appt with Nevin Bloodgood on 10/4 for anemia.  Procedures will be discussed at that time.

## 2020-08-27 ENCOUNTER — Ambulatory Visit: Payer: Medicaid Other | Admitting: Nurse Practitioner

## 2020-09-06 ENCOUNTER — Ambulatory Visit (INDEPENDENT_AMBULATORY_CARE_PROVIDER_SITE_OTHER): Payer: Medicaid Other

## 2020-09-06 DIAGNOSIS — I6340 Cerebral infarction due to embolism of unspecified cerebral artery: Secondary | ICD-10-CM | POA: Diagnosis not present

## 2020-09-11 LAB — CUP PACEART REMOTE DEVICE CHECK
Date Time Interrogation Session: 20211018230618
Implantable Pulse Generator Implant Date: 20201229

## 2020-09-11 NOTE — Progress Notes (Signed)
Carelink Summary Report / Loop Recorder 

## 2020-10-08 ENCOUNTER — Ambulatory Visit (INDEPENDENT_AMBULATORY_CARE_PROVIDER_SITE_OTHER): Payer: Medicaid Other

## 2020-10-08 DIAGNOSIS — I6340 Cerebral infarction due to embolism of unspecified cerebral artery: Secondary | ICD-10-CM

## 2020-10-15 LAB — CUP PACEART REMOTE DEVICE CHECK
Date Time Interrogation Session: 20211120230124
Implantable Pulse Generator Implant Date: 20201229

## 2020-10-16 NOTE — Progress Notes (Signed)
Carelink Summary Report / Loop Recorder 

## 2020-11-05 ENCOUNTER — Ambulatory Visit: Payer: Medicaid Other | Admitting: Adult Health

## 2020-11-08 ENCOUNTER — Ambulatory Visit (INDEPENDENT_AMBULATORY_CARE_PROVIDER_SITE_OTHER): Payer: Medicaid Other

## 2020-11-08 DIAGNOSIS — I6340 Cerebral infarction due to embolism of unspecified cerebral artery: Secondary | ICD-10-CM

## 2020-11-08 LAB — CUP PACEART REMOTE DEVICE CHECK
Date Time Interrogation Session: 20211215230314
Implantable Pulse Generator Implant Date: 20201229

## 2020-11-22 IMAGING — CT CT ANGIO HEAD
2 of 7 series · 8 of 33 positions shown · IV contrast (OMNI 350)
Comparison: None.

CLINICAL DATA: Stroke, follow-up

EXAM:
CT ANGIOGRAPHY HEAD AND NECK
TECHNIQUE: Multidetector CT imaging of the head and neck was performed using
the standard protocol during bolus administration of intravenous
contrast. Multiplanar CT image reconstructions and MIPs were
obtained to evaluate the vascular anatomy. Carotid stenosis
measurements (when applicable) are obtained utilizing NASCET
criteria, using the distal internal carotid diameter as the
denominator.
CONTRAST:  75mL OMNIPAQUE IOHEXOL 350 MG/ML SOLN

[Series 5: cta neck · axial · 0.44mm/px · z∈[-217,-91]mm · 2 of 190 slices shown]
[im 64/190  soft-tissue]
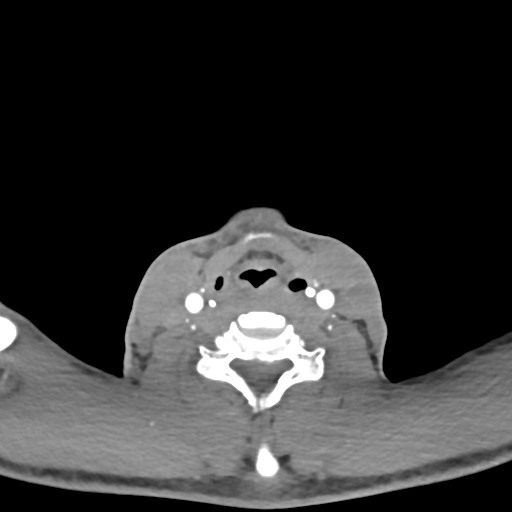
[im 127/190  soft-tissue]
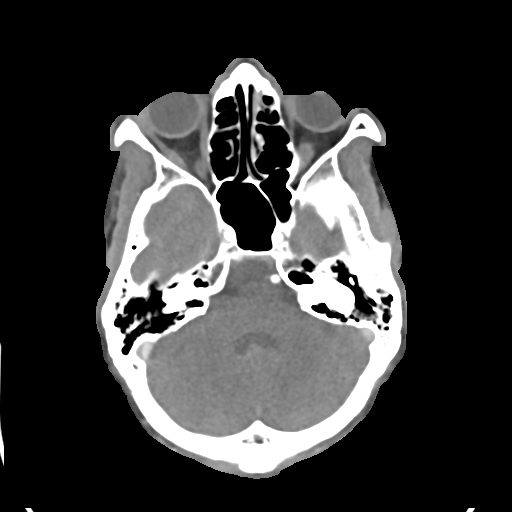

[Series 7: cta neck axial · axial · 0.39mm/px · z∈[-296,-26]mm · 6 of 378 slices shown]
[im 54/378  soft-tissue]
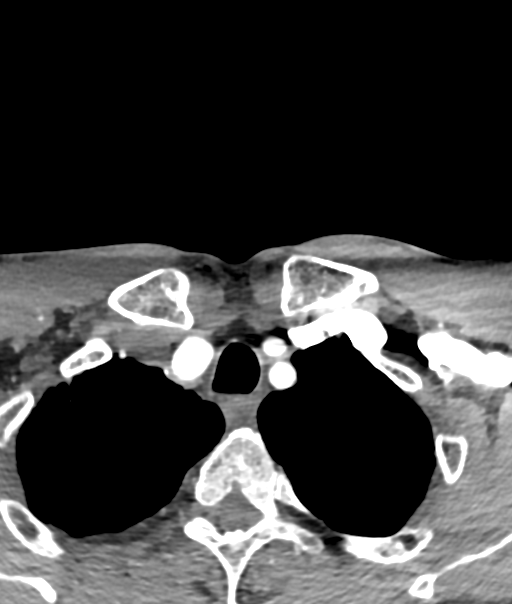
[im 108/378  bone]
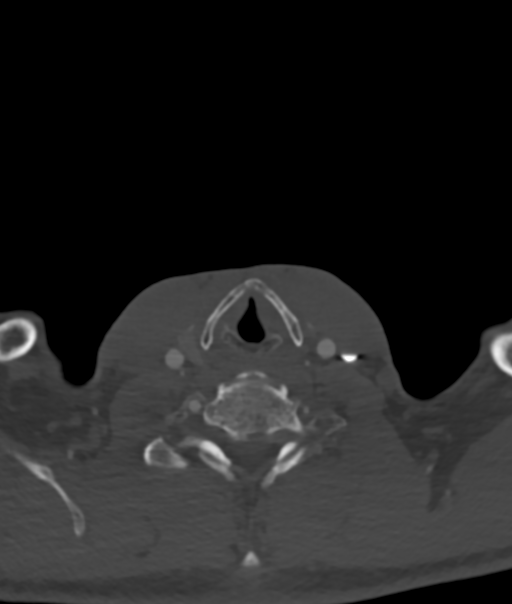
[im 162/378  soft-tissue]
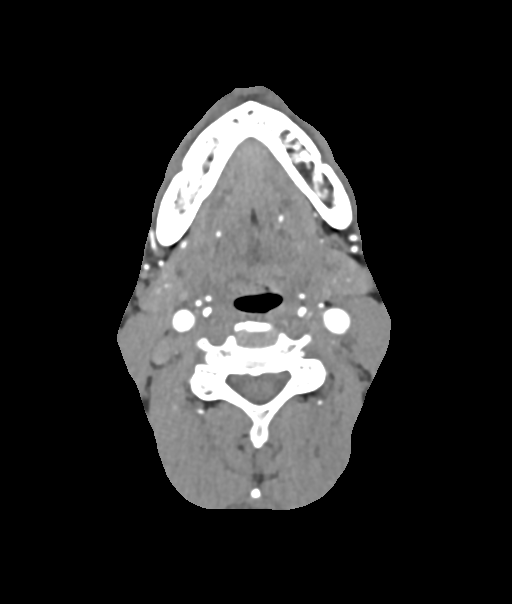
[im 216/378  bone]
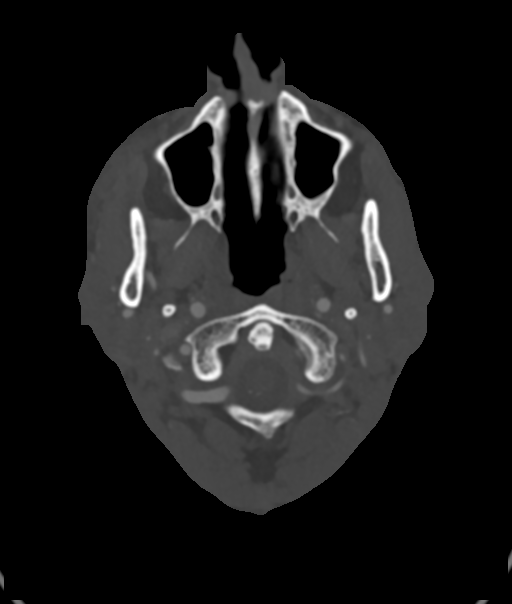
[im 270/378  soft-tissue]
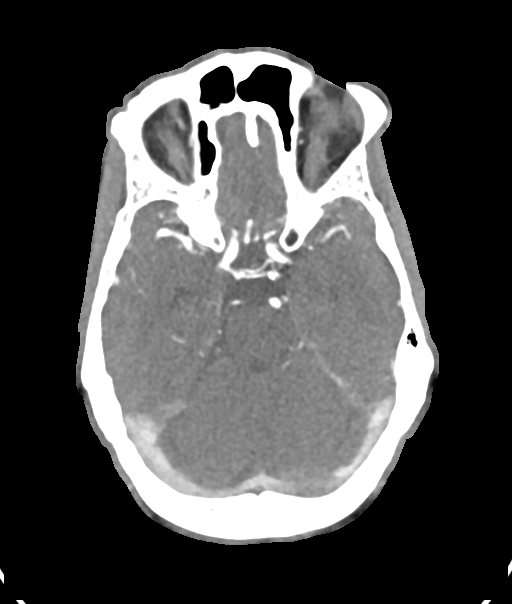
[im 324/378  bone]
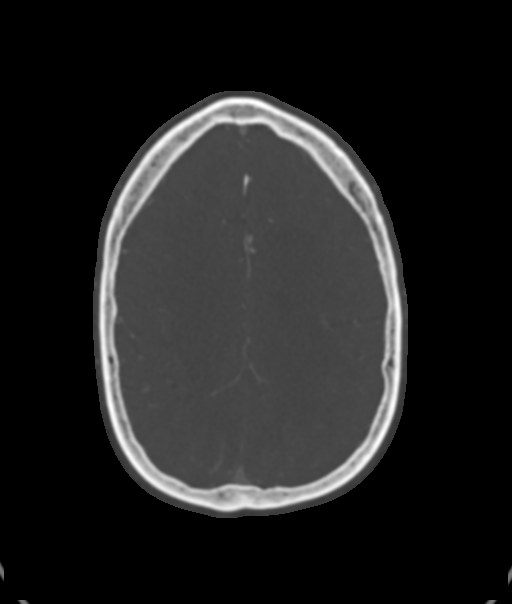

[8 of 33 positions shown; findings below may reference images not displayed]

FINDINGS: CTA NECK FINDINGS

Aortic arch: Great vessel origins are patent.

Right carotid system: Common, internal, and external carotid
arteries are patent. There is minor plaque without measurable
stenosis at the ICA origin.

Left carotid system: Common, internal, and external carotid arteries
are patent. There is minor plaque without measurable stenosis at the
ICA origin.

Vertebral arteries: Patent. Right vertebral artery is dominant.
There is focal moderate stenosis of the proximal right vertebral
artery just beyond the origin.

Skeleton: Degenerative changes are present, greatest at C6-C7.

Other neck: No neck mass or adenopathy.

Upper chest: Bullous changes at the lung apices. Paraseptal and
centrilobular emphysema.

Review of the MIP images confirms the above findings

CTA HEAD FINDINGS

Anterior circulation: Intracranial internal carotid arteries are
patent with mild calcified plaque. There is a 2 x 1 mm laterally
directed outpouching from the paraclinoid left ICA. Anterior and
middle cerebral arteries are patent.

Posterior circulation: Intracranial right vertebral artery is patent
with mild calcified and noncalcified plaque. Left vertebral artery
terminates as a PICA. Basilar artery is patent. Posterior cerebral
arteries are patent. Right larger than left posterior communicating
arteries are present.

Venous sinuses: As permitted by contrast timing, patent.

Anatomic variants: Fetal origin of the right posterior cerebral
artery.

Review of the MIP images confirms the above findings
IMPRESSION: No large vessel occlusion or hemodynamically significant stenosis.

Small aneurysm of the paraclinoid left ICA.

## 2020-11-22 NOTE — Progress Notes (Signed)
Carelink Summary Report / Loop Recorder 

## 2020-12-10 ENCOUNTER — Ambulatory Visit (INDEPENDENT_AMBULATORY_CARE_PROVIDER_SITE_OTHER): Payer: Medicaid Other

## 2020-12-10 DIAGNOSIS — I6340 Cerebral infarction due to embolism of unspecified cerebral artery: Secondary | ICD-10-CM | POA: Diagnosis not present

## 2020-12-13 LAB — CUP PACEART REMOTE DEVICE CHECK
Date Time Interrogation Session: 20220117230341
Implantable Pulse Generator Implant Date: 20201229

## 2020-12-25 NOTE — Progress Notes (Signed)
Carelink Summary Report / Loop Recorder 

## 2021-01-06 ENCOUNTER — Emergency Department (HOSPITAL_COMMUNITY): Payer: Medicaid Other

## 2021-01-06 ENCOUNTER — Encounter (HOSPITAL_COMMUNITY): Payer: Self-pay

## 2021-01-06 ENCOUNTER — Observation Stay (HOSPITAL_COMMUNITY): Payer: Medicaid Other

## 2021-01-06 ENCOUNTER — Inpatient Hospital Stay (HOSPITAL_COMMUNITY)
Admission: EM | Admit: 2021-01-06 | Discharge: 2021-01-09 | DRG: 871 | Disposition: A | Discharge status: Skilled Nursing Facility | Payer: Medicaid Other | Attending: Internal Medicine | Admitting: Internal Medicine

## 2021-01-06 ENCOUNTER — Other Ambulatory Visit: Payer: Self-pay

## 2021-01-06 DIAGNOSIS — I10 Essential (primary) hypertension: Secondary | ICD-10-CM

## 2021-01-06 DIAGNOSIS — Z20822 Contact with and (suspected) exposure to covid-19: Secondary | ICD-10-CM | POA: Diagnosis present

## 2021-01-06 DIAGNOSIS — A419 Sepsis, unspecified organism: Principal | ICD-10-CM | POA: Diagnosis present

## 2021-01-06 DIAGNOSIS — J45909 Unspecified asthma, uncomplicated: Secondary | ICD-10-CM | POA: Diagnosis present

## 2021-01-06 DIAGNOSIS — F0390 Unspecified dementia without behavioral disturbance: Secondary | ICD-10-CM | POA: Diagnosis present

## 2021-01-06 DIAGNOSIS — N182 Chronic kidney disease, stage 2 (mild): Secondary | ICD-10-CM | POA: Diagnosis present

## 2021-01-06 DIAGNOSIS — Z79899 Other long term (current) drug therapy: Secondary | ICD-10-CM

## 2021-01-06 DIAGNOSIS — F32A Depression, unspecified: Secondary | ICD-10-CM | POA: Diagnosis present

## 2021-01-06 DIAGNOSIS — I129 Hypertensive chronic kidney disease with stage 1 through stage 4 chronic kidney disease, or unspecified chronic kidney disease: Secondary | ICD-10-CM | POA: Diagnosis present

## 2021-01-06 DIAGNOSIS — D638 Anemia in other chronic diseases classified elsewhere: Secondary | ICD-10-CM | POA: Diagnosis present

## 2021-01-06 DIAGNOSIS — Z8673 Personal history of transient ischemic attack (TIA), and cerebral infarction without residual deficits: Secondary | ICD-10-CM

## 2021-01-06 DIAGNOSIS — F419 Anxiety disorder, unspecified: Secondary | ICD-10-CM | POA: Diagnosis present

## 2021-01-06 DIAGNOSIS — Z8619 Personal history of other infectious and parasitic diseases: Secondary | ICD-10-CM

## 2021-01-06 DIAGNOSIS — Z882 Allergy status to sulfonamides status: Secondary | ICD-10-CM

## 2021-01-06 DIAGNOSIS — J189 Pneumonia, unspecified organism: Secondary | ICD-10-CM | POA: Diagnosis not present

## 2021-01-06 DIAGNOSIS — Z7982 Long term (current) use of aspirin: Secondary | ICD-10-CM

## 2021-01-06 DIAGNOSIS — E785 Hyperlipidemia, unspecified: Secondary | ICD-10-CM | POA: Diagnosis present

## 2021-01-06 DIAGNOSIS — Z87891 Personal history of nicotine dependence: Secondary | ICD-10-CM

## 2021-01-06 LAB — URINALYSIS, ROUTINE W REFLEX MICROSCOPIC
Bilirubin Urine: NEGATIVE
Glucose, UA: NEGATIVE mg/dL
Ketones, ur: NEGATIVE mg/dL
Leukocytes,Ua: NEGATIVE
Nitrite: NEGATIVE
Protein, ur: 30 mg/dL — AB
Specific Gravity, Urine: 1.025 (ref 1.005–1.030)
pH: 6 (ref 5.0–8.0)

## 2021-01-06 LAB — COMPREHENSIVE METABOLIC PANEL
ALT: 18 U/L (ref 0–44)
AST: 18 U/L (ref 15–41)
Albumin: 2.5 g/dL — ABNORMAL LOW (ref 3.5–5.0)
Alkaline Phosphatase: 90 U/L (ref 38–126)
Anion gap: 10 (ref 5–15)
BUN: 17 mg/dL (ref 6–20)
CO2: 25 mmol/L (ref 22–32)
Calcium: 8.4 mg/dL — ABNORMAL LOW (ref 8.9–10.3)
Chloride: 104 mmol/L (ref 98–111)
Creatinine, Ser: 1 mg/dL (ref 0.61–1.24)
GFR, Estimated: >60 mL/min (ref >60)
Glucose, Bld: 126 mg/dL — ABNORMAL HIGH (ref 70–99)
Potassium: 3.8 mmol/L (ref 3.5–5.1)
Sodium: 139 mmol/L (ref 135–145)
Total Bilirubin: 1.3 mg/dL — ABNORMAL HIGH (ref 0.3–1.2)
Total Protein: 7.7 g/dL (ref 6.5–8.1)

## 2021-01-06 LAB — RESP PANEL BY RT-PCR (FLU A&B, COVID) ARPGX2
Influenza A by PCR: NEGATIVE
Influenza B by PCR: NEGATIVE
SARS Coronavirus 2 by RT PCR: NEGATIVE

## 2021-01-06 LAB — CBC WITH DIFFERENTIAL/PLATELET
Abs Immature Granulocytes: 0.16 10*3/uL — ABNORMAL HIGH (ref 0.00–0.07)
Basophils Absolute: 0.1 10*3/uL (ref 0.0–0.1)
Basophils Relative: 0 %
Eosinophils Absolute: 0 10*3/uL (ref 0.0–0.5)
Eosinophils Relative: 0 %
HCT: 31 % — ABNORMAL LOW (ref 39.0–52.0)
Hemoglobin: 9.9 g/dL — ABNORMAL LOW (ref 13.0–17.0)
Immature Granulocytes: 1 %
Lymphocytes Relative: 7 %
Lymphs Abs: 1.1 10*3/uL (ref 0.7–4.0)
MCH: 28.9 pg (ref 26.0–34.0)
MCHC: 31.9 g/dL (ref 30.0–36.0)
MCV: 90.4 fL (ref 80.0–100.0)
Monocytes Absolute: 1.6 10*3/uL — ABNORMAL HIGH (ref 0.1–1.0)
Monocytes Relative: 9 %
Neutro Abs: 14.4 10*3/uL — ABNORMAL HIGH (ref 1.7–7.7)
Neutrophils Relative %: 83 %
Platelets: 352 10*3/uL (ref 150–400)
RBC: 3.43 MIL/uL — ABNORMAL LOW (ref 4.22–5.81)
RDW: 14.4 % (ref 11.5–15.5)
WBC: 17.3 10*3/uL — ABNORMAL HIGH (ref 4.0–10.5)
nRBC: 0 % (ref 0.0–0.2)

## 2021-01-06 LAB — PROTIME-INR
INR: 1.2 (ref 0.8–1.2)
Prothrombin Time: 14.7 seconds (ref 11.4–15.2)

## 2021-01-06 LAB — APTT: aPTT: 35 seconds (ref 24–36)

## 2021-01-06 LAB — HIV ANTIBODY (ROUTINE TESTING W REFLEX): HIV Screen 4th Generation wRfx: NONREACTIVE

## 2021-01-06 LAB — MRSA PCR SCREENING: MRSA by PCR: POSITIVE — AB

## 2021-01-06 LAB — LACTIC ACID, PLASMA: Lactic Acid, Venous: 1 mmol/L (ref 0.5–1.9)

## 2021-01-06 MED ORDER — ACETAMINOPHEN 650 MG RE SUPP: 650.0000 mg | Freq: Four times a day (QID) | RECTAL | Status: DC | PRN

## 2021-01-06 MED ORDER — ALBUTEROL SULFATE (2.5 MG/3ML) 0.083% IN NEBU: 2.5000 mg | INHALATION_SOLUTION | RESPIRATORY_TRACT | Status: DC | PRN

## 2021-01-06 MED ORDER — SODIUM CHLORIDE 0.9 % IV SOLN
2.0000 g | INTRAVENOUS | Status: DC
  Administered 2021-01-07 – 2021-01-09 (×3): 2 g via INTRAVENOUS
  Filled 2021-01-06 (×3): qty 2

## 2021-01-06 MED ORDER — SERTRALINE HCL 50 MG PO TABS
50.0000 mg | ORAL_TABLET | Freq: Every day | ORAL | Status: DC
  Administered 2021-01-06 – 2021-01-09 (×4): 50 mg via ORAL
  Filled 2021-01-06 (×4): qty 1

## 2021-01-06 MED ORDER — LACTATED RINGERS IV BOLUS (SEPSIS)
250.0000 mL | Freq: Once | INTRAVENOUS | Status: AC
  Administered 2021-01-06: 250 mL via INTRAVENOUS

## 2021-01-06 MED ORDER — SODIUM CHLORIDE 0.9 % IV SOLN: 500.0000 mg | INTRAVENOUS | Status: DC

## 2021-01-06 MED ORDER — LACTATED RINGERS IV BOLUS (SEPSIS)
1000.0000 mL | Freq: Once | INTRAVENOUS | Status: AC
  Administered 2021-01-06: 1000 mL via INTRAVENOUS

## 2021-01-06 MED ORDER — ACETAMINOPHEN 325 MG PO TABS
650.0000 mg | ORAL_TABLET | Freq: Four times a day (QID) | ORAL | Status: DC | PRN
  Administered 2021-01-06 (×2): 650 mg via ORAL
  Filled 2021-01-06 (×2): qty 2

## 2021-01-06 MED ORDER — SODIUM CHLORIDE 0.9 % IV SOLN
500.0000 mg | INTRAVENOUS | Status: DC
  Administered 2021-01-07: 500 mg via INTRAVENOUS
  Filled 2021-01-06: qty 500

## 2021-01-06 MED ORDER — KETOROLAC TROMETHAMINE 15 MG/ML IJ SOLN
15.0000 mg | Freq: Once | INTRAMUSCULAR | Status: AC
  Administered 2021-01-06: 15 mg via INTRAVENOUS
  Filled 2021-01-06: qty 1

## 2021-01-06 MED ORDER — LACTATED RINGERS IV SOLN
INTRAVENOUS | Status: DC
Start: 2021-01-06 — End: 2021-01-06

## 2021-01-06 MED ORDER — LABETALOL HCL 5 MG/ML IV SOLN
10.0000 mg | INTRAVENOUS | Status: DC | PRN
  Administered 2021-01-06: 10 mg via INTRAVENOUS
  Filled 2021-01-06 (×2): qty 4

## 2021-01-06 MED ORDER — LISINOPRIL 20 MG PO TABS
20.0000 mg | ORAL_TABLET | Freq: Every day | ORAL | Status: DC
  Administered 2021-01-06 – 2021-01-09 (×4): 20 mg via ORAL
  Filled 2021-01-06 (×4): qty 1

## 2021-01-06 MED ORDER — ENOXAPARIN SODIUM 40 MG/0.4ML ~~LOC~~ SOLN
40.0000 mg | SUBCUTANEOUS | Status: DC
  Administered 2021-01-06 – 2021-01-08 (×3): 40 mg via SUBCUTANEOUS
  Filled 2021-01-06 (×3): qty 0.4

## 2021-01-06 MED ORDER — ASPIRIN EC 81 MG PO TBEC
81.0000 mg | DELAYED_RELEASE_TABLET | Freq: Every day | ORAL | Status: DC
  Administered 2021-01-06 – 2021-01-09 (×4): 81 mg via ORAL
  Filled 2021-01-06 (×4): qty 1

## 2021-01-06 MED ORDER — SODIUM CHLORIDE 0.9% FLUSH
3.0000 mL | Freq: Two times a day (BID) | INTRAVENOUS | Status: DC
  Administered 2021-01-06 – 2021-01-08 (×4): 3 mL via INTRAVENOUS

## 2021-01-06 MED ORDER — SODIUM CHLORIDE 0.9 % IV SOLN: 2.0000 g | INTRAVENOUS | Status: DC

## 2021-01-06 MED ORDER — SODIUM CHLORIDE 0.9 % IV SOLN
1.0000 g | Freq: Once | INTRAVENOUS | Status: AC
  Administered 2021-01-06: 1 g via INTRAVENOUS
  Filled 2021-01-06: qty 10

## 2021-01-06 MED ORDER — CHLORHEXIDINE GLUCONATE CLOTH 2 % EX PADS
6.0000 | MEDICATED_PAD | Freq: Every day | CUTANEOUS | Status: DC
  Administered 2021-01-07 – 2021-01-09 (×2): 6 via TOPICAL

## 2021-01-06 MED ORDER — ATORVASTATIN CALCIUM 40 MG PO TABS
40.0000 mg | ORAL_TABLET | Freq: Every day | ORAL | Status: DC
  Administered 2021-01-06 – 2021-01-08 (×3): 40 mg via ORAL
  Filled 2021-01-06 (×3): qty 1

## 2021-01-06 MED ORDER — SODIUM CHLORIDE 0.9 % IV SOLN
500.0000 mg | Freq: Once | INTRAVENOUS | Status: AC
  Administered 2021-01-06: 500 mg via INTRAVENOUS
  Filled 2021-01-06: qty 500

## 2021-01-06 MED ORDER — POLYETHYLENE GLYCOL 3350 17 G PO PACK: 17.0000 g | PACK | Freq: Every day | ORAL | Status: DC | PRN

## 2021-01-06 MED ORDER — MUPIROCIN 2 % EX OINT
1.0000 "application " | TOPICAL_OINTMENT | Freq: Two times a day (BID) | CUTANEOUS | Status: DC
  Administered 2021-01-06 – 2021-01-09 (×6): 1 via NASAL
  Filled 2021-01-06 (×3): qty 22

## 2021-01-06 NOTE — Progress Notes (Signed)
Modified Barium Swallow Progress Note  Patient Details  Name: Darren Pearson MRN: [id number] Date of Birth: 1966/10/11  Today's Date: 01/06/2021  Modified Barium Swallow completed.  Full report located under Chart Review in the Imaging Section.  Brief recommendations include the following:  Clinical Impression  Patient presents with a mild oropharyngeal dysphagia with likely cognitive impact. Patient would intermittently hold liquids in mouth and was observed to be intently watching MBS video during testing. Mild delay in mastication and delay in oral transit with puree and regular solids bolus. Patient was not able to maintain bolus of thin liquids and barium tablet but was able to swallow whole barium tablet when in spoon of puree (applesauce). Swallow initiation delays at level of vallecular sinus observed with puree and regular solids but not with liquids. Trace amount of vallecular and pyriform sinus residuals remained after initial swallow but fully cleared with subsequent swallows. SLP did observe small outpouching which would intermittently collect trace amount of liquids, though this would fully clear and did not appear to be significantly impacting swallow. (radiologist not present to confirm; this was observed by SLP in [facility] 08/2020. Patient did exhbiit some delayed coughing when reclined in bed after MBS and SLP suspects this is GERD related. Patient is safe to continue with Regular solids, thin liquids.   Swallow Evaluation Recommendations       SLP Diet Recommendations: Thin liquid;Regular solids   Liquid Administration via: Cup;Straw   Medication Administration: Whole meds with puree   Supervision: Patient able to self feed;Full supervision/cueing for compensatory strategies   Compensations: Minimize environmental distractions;Small sips/bites;Slow rate   Postural Changes: Seated upright at 90 degrees   Oral Care Recommendations: Oral care BID       Sonia Baller,  MA, CCC-SLP Speech Therapy

## 2021-01-06 NOTE — Progress Notes (Addendum)
Notification for treatment team. This patient's  Contact is his sister, Glenetta Borg, number in chart. She is requesting that anyone with updates contact her.

## 2021-01-06 NOTE — ED Provider Notes (Signed)
[facility] DEPT Provider Note   CSN: [id number] Arrival date & time: 01/06/21  0430     History Chief Complaint  Patient presents with  . Fever    Darren Pearson is a 55 y.o. male with a history of prior CVA, anxiety, depression, anemia, hepatitis C, hypertension, and CKD who presents to the emergency department from [place] care facility for evaluation of fever over the past 2 to 3 days.  Report per EMS is that nursing care facility had noted fever to 102 at the facility, not improving significantly with Tylenol, last dose was 1000 mg at 1 AM.  Per EMS temporal temp was 102 in route.  Patient states he has been coughing and has felt short of breath.  No other major complaints.  No alleviating or rating factors.  Per EMS patient is alert and oriented x1 at baseline.  Patient denies chest pain, vomiting, diarrhea, dysuria, or abdominal pain to me.  HPI     Past Medical History:  Diagnosis Date  . Allergy   . Anemia   . Anxiety and depression   . Asthma   . CVA (cerebral vascular accident) ([place])   . Hepatitis C   . History of chicken pox   . Hypertension     Patient Active Problem List   Diagnosis Date Noted  . AKI (acute kidney injury) ([place]) 05/16/2020  . CKD (chronic kidney disease), stage II 05/16/2020  . Episode of unresponsiveness 05/09/2020  . Symptomatic anemia 05/09/2020  . History of CVA (cerebrovascular accident) 05/09/2020  . Elevated troponin 05/09/2020  . CVA (cerebral vascular accident) ([place]) 11/20/2019  . Hypokalemia 11/20/2019  . Accelerated hypertension 11/20/2019  . Hyperammonemia ([place]) 11/20/2019  . Elevated serum creatinine 11/20/2019  . Elevated blood pressure 03/15/2015  . Rash and nonspecific skin eruption 03/15/2015  . Hepatitis C 04/12/2013  . Allergic rhinitis 04/12/2013  . Unspecified asthma(493.90) 04/12/2013    Past Surgical History:  Procedure Laterality Date  . BUBBLE STUDY  11/22/2019    Procedure: BUBBLE STUDY;  Surgeon: Dorothy Spark, MD;  Location: [facility];  Service: Cardiovascular;;  . LOOP RECORDER INSERTION N/A 11/22/2019   Procedure: LOOP RECORDER INSERTION;  Surgeon: Thompson Grayer, MD;  Location: [facility] CV LAB;  Service: Cardiovascular;  Laterality: N/A;  . TEE WITHOUT CARDIOVERSION N/A 11/22/2019   Procedure: TRANSESOPHAGEAL ECHOCARDIOGRAM (TEE);  Surgeon: Dorothy Spark, MD;  Location: [facility] ENDOSCOPY;  Service: Cardiovascular;  Laterality: N/A;       Family History  Problem Relation Age of Onset  . Stomach cancer Mother 36  . Diabetes Father   . Paget's disease of bone Father   . Colon cancer Neg Hx   . Esophageal cancer Neg Hx   . Pancreatic cancer Neg Hx   . Liver disease Neg Hx     Social History   Tobacco Use  . Smoking status: Former Smoker    Packs/day: 0.25    Types: Cigarettes    Start date: 11/24/2012  . Smokeless tobacco: Never Used  . Tobacco comment: has not smoked since 11/20/2019  Vaping Use  . Vaping Use: Former  Substance Use Topics  . Alcohol use: Not Currently    Alcohol/week: 2.0 standard drinks    Types: 2 Cans of beer per week    Comment: social, states stopped on 11/20/2019.  Marland Kitchen Drug use: Not Currently    Frequency: 5.0 times per week    Types: Marijuana    Comment: Stop  11/20/2019    Home Medications Prior to Admission medications   Medication Sig Start Date End Date Taking? Authorizing Provider  acetaminophen (TYLENOL) 325 MG tablet Take 650 mg by mouth every 6 (six) hours as needed for mild pain or headache.    [provider]  aspirin 81 MG EC tablet Take 1 tablet (81 mg total) by mouth daily. 11/24/19   Kathie Dike, MD  atorvastatin (LIPITOR) 40 MG tablet Take 1 tablet (40 mg total) by mouth daily at 6 PM. 11/23/19   Kathie Dike, MD  cyanocobalamin 1000 MCG tablet Take 1 tablet (1,000 mcg total) by mouth daily. 11/24/19   Kathie Dike, MD  lisinopril (ZESTRIL) 20 MG tablet Take 1  tablet (20 mg total) by mouth daily. 05/24/20 05/24/21  Charlynne Cousins, MD    Allergies    Sulfa antibiotics  Review of Systems   Review of Systems  Constitutional: Positive for fever.  Respiratory: Positive for cough and shortness of breath.   Cardiovascular: Negative for chest pain.  Gastrointestinal: Negative for abdominal pain and vomiting.  Genitourinary: Negative for dysuria.  Neurological: Negative for syncope.  All other systems reviewed and are negative.   Physical Exam Updated Vital Signs BP (!) 148/88 (BP Location: Right Arm)   Pulse (!) 107   Temp (!) 103 F (39.4 C) (Oral)   Resp (!) 22   SpO2 94%   Physical Exam Vitals and nursing note reviewed.  Constitutional:      General: He is not in acute distress.    Appearance: He is well-developed. He is not toxic-appearing.     Comments: Hot to the touch.  HENT:     Head: Normocephalic and atraumatic.  Eyes:     General:        Right eye: No discharge.        Left eye: No discharge.     Conjunctiva/sclera: Conjunctivae normal.  Cardiovascular:     Rate and Rhythm: Regular rhythm. Tachycardia present.  Pulmonary:     Effort: No respiratory distress.     Breath sounds: Decreased air movement (Bibasilar right greater than left.) present. No wheezing or rales.  Abdominal:     General: There is no distension.     Palpations: Abdomen is soft.     Tenderness: There is no abdominal tenderness.  Musculoskeletal:     Cervical back: Neck supple.     Comments: Left third through fifth digit amputations.  Skin:    General: Skin is warm and dry.     Findings: No rash.  Neurological:     Mental Status: He is alert.     Comments: Clear speech.   Psychiatric:        Behavior: Behavior normal.    ED Results / Procedures / Treatments   Labs (all labs ordered are listed, but only abnormal results are displayed) Labs Reviewed  COMPREHENSIVE METABOLIC PANEL - Abnormal; Notable for the following components:       Result Value   Glucose, Bld 126 (*)    Calcium 8.4 (*)    Albumin 2.5 (*)    Total Bilirubin 1.3 (*)    All other components within normal limits  CBC WITH DIFFERENTIAL/PLATELET - Abnormal; Notable for the following components:   WBC 17.3 (*)    RBC 3.43 (*)    Hemoglobin 9.9 (*)    HCT 31.0 (*)    Neutro Abs 14.4 (*)    Monocytes Absolute 1.6 (*)    Abs Immature  Granulocytes 0.16 (*)    All other components within normal limits  URINALYSIS, ROUTINE W REFLEX MICROSCOPIC - Abnormal; Notable for the following components:   Hgb urine dipstick MODERATE (*)    Protein, ur 30 (*)    Bacteria, UA RARE (*)    All other components within normal limits  RESP PANEL BY RT-PCR (FLU A&B, COVID) ARPGX2  URINE CULTURE  CULTURE, BLOOD (ROUTINE X 2)  CULTURE, BLOOD (ROUTINE X 2)  LACTIC ACID, PLASMA  PROTIME-INR  APTT  LACTIC ACID, PLASMA    EKG EKG Interpretation  Date/Time:  Sunday January 06 2021 04:42:59 EST Ventricular Rate:  111 PR Interval:    QRS Duration: 137 QT Interval:  365 QTC Calculation: 496 R Axis:   109 Text Interpretation: Sinus tachycardia RBBB and LPFB Since last tracing rate faster Otherwise no significant change Confirmed by Deno Etienne [phone]) on 01/06/2021 5:44:25 AM   Radiology DG Chest Port 1 View  Result Date: 01/06/2021 CLINICAL DATA:  55 year old male with possible sepsis. Fever for 2 days but reportedly negative for COVID-19. EXAM: PORTABLE CHEST 1 VIEW COMPARISON:  Portable chest 05/09/2020 and earlier. FINDINGS: Portable AP semi upright view at 0523 hours. Mildly lower lung volumes. Stable cardiac size and mediastinal contours. Chronic left chest cardiac event recorder or superficial to febrile later. Visualized tracheal air column is within normal limits. Chronic bullous emphysema in the upper lungs. No pneumothorax. No evidence of layering pleural effusion the right costophrenic angle but there is confluent, hazy abnormal right lower lung opacity which  appears to abut the minor fissure. Partial obscuration of the right heart border. Right diaphragm remains visible. Stable lungs elsewhere. No acute osseous abnormality identified. IMPRESSION: 1. Abnormal right lower lung opacity, suspected to be right middle lobe consolidation. Favor Pneumonia in this clinical setting. PA and lateral chest radiographs would be complementary when possible. No definite effusion. 2. Underlying chronic bullous Emphysema (ICD10-J43.9). Electronically Signed   By: Genevie Ann M.D.   On: 01/06/2021 05:38    Procedures .Critical Care Performed by: Amaryllis Dyke, PA-C Authorized by: Amaryllis Dyke, PA-C    CRITICAL CARE Performed by: Kennith Maes   Total critical care time: 35 minutes  Critical care time was exclusive of separately billable procedures and treating other patients.  Critical care was necessary to treat or prevent imminent or life-threatening deterioration.  Critical care was time spent personally by me on the following activities: development of treatment plan with patient and/or surrogate as well as nursing, discussions with consultants, evaluation of patient's response to treatment, examination of patient, obtaining history from patient or surrogate, ordering and performing treatments and interventions, ordering and review of laboratory studies, ordering and review of radiographic studies, pulse oximetry and re-evaluation of patient's condition.    Medications Ordered in ED Medications - No data to display  ED Course  I have reviewed the triage vital signs and the nursing notes.  Pertinent labs & imaging results that were available during my care of the patient were reviewed by me and considered in my medical decision making (see chart for details).    Darren Pearson was evaluated in Emergency Department on 01/06/2021 for the symptoms described in the history of present illness. He/she was evaluated in the context of the  global COVID-19 pandemic, which necessitated consideration that the patient might be at risk for infection with the SARS-CoV-2 virus that causes COVID-19. Institutional protocols and algorithms that pertain to the evaluation of patients at risk for COVID-19  are in a state of rapid change based on information released by regulatory bodies including the CDC and federal and state organizations. These policies and algorithms were followed during the patient's care in the ED.  MDM Rules/Calculators/A&P                         Patient presents to the ED for evaluation of fever.  Patient notably febrile, tachycardic, mildly tachypneic on arrival.  Additional history obtained:  Additional history obtained from chart review & nursing note review.   EKG: Faster otherwise no significant change.   Lab Tests:  I Ordered, reviewed, and interpreted labs, which included:  CBC: Leukocytosis at 17.3 with left shift.  Anemia mildly worsened from prior CMP: Fairly unremarkable PT/INR/APTT: Within normal limits Flu/Covid: Negative Lactic acid: Within normal limits  Imaging Studies ordered:  I ordered imaging studies which included chest x-ray, I independently reviewed, formal radiology impression shows:  1. Abnormal right lower lung opacity, suspected to be right middle lobe consolidation. Favor Pneumonia in this clinical setting. PA and lateral chest radiographs would be complementary when possible. No definite effusion. 2. Underlying chronic bullous Emphysema  ED Course:  Patient found to have right-sided pneumonia, likely source of fever, meets SIRS criteria with his fever, tachycardia, and leukocytosis concerning for sepsis, lactic WNL not hypotensive, does get tachypneic into the 30s and desaturates to lower 90s when trying to get up to side of the bed. Will discuss w/ hospitalist service for admission, rocephin/azithromycin ordered for abx, will start 30 cc/kg bolus for possible sepsis, last EF 92-33% and no  diastolic dysfunction on echocardiogram 04/2020. Patient updated on results & plan of care and is agreeable.   06:28: CONSULT: Discussed with hospitalist Dr. Cyd Silence- accepts admission.   Findings and plan of care discussed with supervising physician Dr. Tyrone Nine who is in agreement.   Portions of this note were generated with [employer]. Dictation errors may occur despite best attempts at proofreading.  Final Clinical Impression(s) / ED Diagnoses Final diagnoses:  Community acquired pneumonia of right middle lobe of lung  Sepsis, due to unspecified organism, unspecified whether acute organ dysfunction present [facility])    Rx / DC Orders ED Discharge Orders    None       Amaryllis Dyke, PA-C 01/06/21 Burdette, In[place], DO 01/06/21 [phone]

## 2021-01-06 NOTE — ED Triage Notes (Signed)
Patient BIB [facility] from [facility]. Patient has had a fever for 2 days. Covid negative according to nursing home. Patient has generalized body aches/abdominal pain. Facility said temp of 103F. A&O x1, history of dementia.  Nursing home gave 1000mg  Tylenol at 7pm and 1000mg  1am. EMS temporal 102F.    160/84 BP 116 HR 94% Room Air 20 RR

## 2021-01-06 NOTE — Evaluation (Signed)
Clinical/Bedside Swallow Evaluation Patient Details  Name: Darren Pearson MRN: [id number] Date of Birth: August 06, 1966  Today's Date: 01/06/2021 Time: SLP Start Time (ACUTE ONLY): 1400 SLP Stop Time (ACUTE ONLY): 1420 SLP Time Calculation (min) (ACUTE ONLY): 20 min  Past Medical History:  Past Medical History:  Diagnosis Date  . Allergy   . Anemia   . Anxiety and depression   . Asthma   . CVA (cerebral vascular accident) ([place])   . Hepatitis C   . History of chicken pox   . Hypertension    Past Surgical History:  Past Surgical History:  Procedure Laterality Date  . BUBBLE STUDY  11/22/2019   Procedure: BUBBLE STUDY;  Surgeon: Dorothy Spark, MD;  Location: [place];  Service: Cardiovascular;;  . LOOP RECORDER INSERTION N/A 11/22/2019   Procedure: LOOP RECORDER INSERTION;  Surgeon: Thompson Grayer, MD;  Location: [place] CV LAB;  Service: Cardiovascular;  Laterality: N/A;  . TEE WITHOUT CARDIOVERSION N/A 11/22/2019   Procedure: TRANSESOPHAGEAL ECHOCARDIOGRAM (TEE);  Surgeon: Dorothy Spark, MD;  Location: [facility] ENDOSCOPY;  Service: Cardiovascular;  Laterality: N/A;   HPI:  Patient is a 55 y.o. male with PMH: HCV, HTN, CVA, dementia, asthma who presented to ED from SNF where he resides in memory care unit, with 2-3 days of fever, coughing, SOB. EMS reported temperature of 102 en route to hospital.  MBS from June 2021 revealed normal swallow except for appearance of diverticulum like pouching at level of epiglottis and resulting in some fluid retention   Assessment / Plan / Recommendation Clinical Impression  Patient presents with a mild oropharyngeal dysphagia with delayed coughing with thin liquids via straw sips. Mastication of graham cracker was [facility] and without any overt s/s of impairment. Patient was impulsive with intake and would take large volume sips and at times, hold liquid in mouth. His sister reported that he was holding liquid in mouth earlier today and she  attributed that to him not liking the supplement shake. Patient did have MBS during previous hospital admission and althought swallow appeared WNL, he did have appearance of diverticulum like pouching near epiglottis. SLP is recommending to reassess swallow function via MBS. SLP Visit Diagnosis: Dysphagia, unspecified (R13.10)    Aspiration Risk  Mild aspiration risk    Diet Recommendation Regular;Thin liquid   Liquid Administration via: Straw;Cup Medication Administration: Whole meds with liquid Supervision: Patient able to self feed;Full supervision/cueing for compensatory strategies Compensations: Minimize environmental distractions;Small sips/bites;Slow rate Postural Changes: Seated upright at 90 degrees    Other  Recommendations Oral Care Recommendations: Oral care BID   Follow up Recommendations Skilled Nursing facility;24 hour supervision/assistance      Frequency and Duration min 1 x/week  1 week       Prognosis Prognosis for Safe Diet Advancement: Good      Swallow Study   General Date of Onset: 01/06/21 HPI: Patient is a 55 y.o. male with PMH: HCV, HTN, CVA, dementia, asthma who presented to ED from SNF where he resides in memory care unit, with 2-3 days of fever, coughing, SOB. EMS reported temperature of 102 en route to hospital.  MBS from June 2021 revealed normal swallow except for appearance of diverticulum like pouching at level of epiglottis and resulting in some fluid retention Type of Study: Bedside Swallow Evaluation Previous Swallow Assessment: MBS June 2021 Diet Prior to this Study: Thin liquids;Regular Temperature Spikes Noted: Yes (100.9) Respiratory Status: Room air History of Recent Intubation: No Behavior/Cognition: Alert;Cooperative;Pleasant mood Oral Cavity  Assessment: Within Functional Limits Oral Care Completed by SLP: No Oral Cavity - Dentition: Adequate natural dentition Vision: Functional for self-feeding Self-Feeding Abilities: Able to feed  self;Needs set up;Needs assist Patient Positioning: Upright in bed Baseline Vocal Quality: Normal Volitional Cough: Congested Volitional Swallow: Unable to elicit    Oral/Motor/Sensory Function Overall Oral Motor/Sensory Function: Within functional limits   Ice Chips     Thin Liquid Thin Liquid: Impaired Presentation: Straw;Self Fed Pharyngeal  Phase Impairments: Suspected delayed Swallow;Cough - Delayed    Nectar Thick     Honey Thick     Puree Puree: Not tested   Solid     Solid: Within functional limits     Sonia Baller, MA, CCC-SLP Speech Therapy

## 2021-01-06 NOTE — ED Notes (Signed)
Patient is going with xray for modified barium swallow. Xray is going to arrange to transport him to 1612 after they finish.

## 2021-01-06 NOTE — H&P (Addendum)
History and Physical        Hospital Admission Note Date: 01/06/2021  Patient name: Darren Pearson Medical record number: [id number] Date of birth: [birth year] Age: 55 y.o. Gender: male  PCP: Seward Carol, MD  Patient coming from: [place] care unit   Chief Complaint    Chief Complaint  Patient presents with  . Fever      HPI:   This is a 55 year old male who has been vaccinated against [place] 61 with a past medical history of HCV, HTN, CVA, dementia, Hypertension, asthma who presented to the ED from his nursing care facility with 2-3 days of fever. Patient had a fever up to 102F at the facility which did not improve with Tylenol. Per EMS, his temp was 102F en route. Patient has been coughing and short of breath. Currently he is unable to specify why he is in the hospital but is alert/oriented x 1 at baseline. He denies any complaints at this time.   ED Course: Febrile, tachycardic, tachypneic,  on room air O2 sat drops to low 90s on ambulation. Notable Labs: Na 139, K 3.8, BUN 17, Cr 1.0, AST 18, ALT 18, T bili 1.3, Lactic acid 1.0, WBC 17.3, Hb 9.9, Platelets 352, COVID 19 and flu negative. Notable Imaging: CXR - RML consolidation, underlying chronic bullous emphysema. Patient received Toradol, 2.25 L LR bolus and maintenance fluid, Ceftriaxone/Azithromycin.    Vitals:   01/06/21 0630 01/06/21 0642  BP: 135/85   Pulse: (!) 104   Resp: (!) 24   Temp:  (!) 100.9 F (38.3 C)  SpO2: 96%      Review of Systems:  Review of Systems  Cardiovascular: Negative for chest pain.  Gastrointestinal: Negative for nausea and vomiting.  All other systems reviewed and are negative.   Medical/Social/Family History   Past Medical History: Past Medical History:  Diagnosis Date  . Allergy   . Anemia   . Anxiety and depression   . Asthma   . CVA (cerebral  vascular accident) ([place])   . Hepatitis C   . History of chicken pox   . Hypertension     Past Surgical History:  Procedure Laterality Date  . BUBBLE STUDY  11/22/2019   Procedure: BUBBLE STUDY;  Surgeon: Dorothy Spark, MD;  Location: [place];  Service: Cardiovascular;;  . LOOP RECORDER INSERTION N/A 11/22/2019   Procedure: LOOP RECORDER INSERTION;  Surgeon: Thompson Grayer, MD;  Location: [place] CV LAB;  Service: Cardiovascular;  Laterality: N/A;  . TEE WITHOUT CARDIOVERSION N/A 11/22/2019   Procedure: TRANSESOPHAGEAL ECHOCARDIOGRAM (TEE);  Surgeon: Dorothy Spark, MD;  Location: [facility] ENDOSCOPY;  Service: Cardiovascular;  Laterality: N/A;    Medications: Prior to Admission medications   Medication Sig Start Date End Date Taking? Authorizing Provider  acetaminophen (TYLENOL) 325 MG tablet Take 650 mg by mouth every 6 (six) hours as needed for mild pain or headache.    [provider]  aspirin 81 MG EC tablet Take 1 tablet (81 mg total) by mouth daily. 11/24/19   Kathie Dike, MD  atorvastatin (LIPITOR) 40 MG tablet Take 1 tablet (40 mg total) by mouth daily at 6 PM. 11/23/19  Kathie Dike, MD  cyanocobalamin 1000 MCG tablet Take 1 tablet (1,000 mcg total) by mouth daily. 11/24/19   Kathie Dike, MD  lisinopril (ZESTRIL) 20 MG tablet Take 1 tablet (20 mg total) by mouth daily. 05/24/20 05/24/21  Charlynne Cousins, MD    Allergies:   Allergies  Allergen Reactions  . Sulfa Antibiotics Hives    Social History:  reports that he has quit smoking. His smoking use included cigarettes. He started smoking about 8 years ago. He smoked 0.25 packs per day. He has never used smokeless tobacco. He reports previous alcohol use of about 2.0 standard drinks of alcohol per week. He reports previous drug use. Frequency: 5.00 times per week. Drug: Marijuana.  Family History: Family History  Problem Relation Age of Onset  . Stomach cancer Mother 46  . Diabetes Father    . Paget's disease of bone Father   . Colon cancer Neg Hx   . Esophageal cancer Neg Hx   . Pancreatic cancer Neg Hx   . Liver disease Neg Hx      Objective   Physical Exam: Blood pressure 135/85, pulse (!) 104, temperature (!) 100.9 F (38.3 C), temperature source Oral, resp. rate (!) 24, SpO2 96 %.  Physical Exam Vitals and nursing note reviewed.  Constitutional:      Appearance: Normal appearance.  HENT:     Head: Normocephalic and atraumatic.  Eyes:     Conjunctiva/sclera: Conjunctivae normal.  Cardiovascular:     Rate and Rhythm: Normal rate and regular rhythm.  Pulmonary:     Effort: Pulmonary effort is normal. No respiratory distress.     Breath sounds: Normal breath sounds.     Comments: Right sided wheeze Abdominal:     General: Abdomen is flat.     Palpations: Abdomen is soft.  Musculoskeletal:        General: No swelling or tenderness.  Skin:    Coloration: Skin is not jaundiced or pale.  Neurological:     Mental Status: He is alert. Mental status is at baseline.     Comments: Awake alert and oriented x 1 to person  Psychiatric:        Mood and Affect: Mood normal.        Behavior: Behavior normal.     LABS on Admission: I have personally reviewed all the labs and imaging below    Basic Metabolic Panel: Recent Labs  Lab 01/06/21 0450  NA 139  K 3.8  CL 104  CO2 25  GLUCOSE 126*  BUN 17  CREATININE 1.00  CALCIUM 8.4*   Liver Function Tests: Recent Labs  Lab 01/06/21 0450  AST 18  ALT 18  ALKPHOS 90  BILITOT 1.3*  PROT 7.7  ALBUMIN 2.5*   No results for input(s): LIPASE, AMYLASE in the last 168 hours. No results for input(s): AMMONIA in the last 168 hours. CBC: Recent Labs  Lab 01/06/21 0450  WBC 17.3*  NEUTROABS 14.4*  HGB 9.9*  HCT 31.0*  MCV 90.4  PLT 352   Cardiac Enzymes: No results for input(s): CKTOTAL, CKMB, CKMBINDEX, TROPONINI in the last 168 hours. BNP: Invalid input(s): POCBNP CBG: No results for input(s):  GLUCAP in the last 168 hours.  Radiological Exams on Admission:  DG Chest Port 1 View  Result Date: 01/06/2021 CLINICAL DATA:  55 year old male with possible sepsis. Fever for 2 days but reportedly negative for COVID-19. EXAM: PORTABLE CHEST 1 VIEW COMPARISON:  Portable chest 05/09/2020 and earlier. FINDINGS: Portable AP  semi upright view at 0523 hours. Mildly lower lung volumes. Stable cardiac size and mediastinal contours. Chronic left chest cardiac event recorder or superficial to febrile later. Visualized tracheal air column is within normal limits. Chronic bullous emphysema in the upper lungs. No pneumothorax. No evidence of layering pleural effusion the right costophrenic angle but there is confluent, hazy abnormal right lower lung opacity which appears to abut the minor fissure. Partial obscuration of the right heart border. Right diaphragm remains visible. Stable lungs elsewhere. No acute osseous abnormality identified. IMPRESSION: 1. Abnormal right lower lung opacity, suspected to be right middle lobe consolidation. Favor Pneumonia in this clinical setting. PA and lateral chest radiographs would be complementary when possible. No definite effusion. 2. Underlying chronic bullous Emphysema (ICD10-J43.9). Electronically Signed   By: Genevie Ann M.D.   On: 01/06/2021 05:38      EKG: Sinus Tachycardia with LPFB, RBBB   A & P   Active Problems:   Sepsis due to pneumonia ([place])   1. Sepsis without septic shock secondary to CAP a. Sepsis criteria: Febrile, tachycardic, tachypnic, leukocytosis  b. RML consolidation on CXR c. Follow up blood cultures d. Discontinue maintenance IV fluids e. Continue Ceftriaxone and Azithromycin f. SLP to evaluate aspiration risk  2. Hypertension a. Continue home lisinopril  3. Dementia a. At baseline  4. History of CVA a. Continue aspirin and statin   DVT prophylaxis: lovenox   Code Status: Prior  Diet: heart healthy Family Communication: Admission,  patients condition and plan of care including tests being ordered have been discussed with the patient who indicates understanding and agrees with the plan and Code Status. Patient's sister was updated  Disposition Plan: The appropriate patient status for this patient is OBSERVATION. Observation status is judged to be reasonable and necessary in order to provide the required intensity of service to ensure the patient's safety. The patient's presenting symptoms, physical exam findings, and initial radiographic and laboratory data in the context of their medical condition is felt to place them at decreased risk for further clinical deterioration. Furthermore, it is anticipated that the patient will be medically stable for discharge from the hospital within 2 midnights of admission. The following factors support the patient status of observation.   " The patient's presenting symptoms include fever. " The physical exam findings include right sided wheeze. " The initial radiographic and laboratory data are RML pneumonia.    Status is: Observation  The patient remains OBS appropriate and will d/c before 2 midnights.  Dispo: The patient is from: SNF              Anticipated d/c is to: SNF              Anticipated d/c date is: 1 day              Patient currently is not medically stable to d/c.   Difficult to place patient No       Consultants  . none  Procedures  . none  Time Spent on Admission: 55 minutes    Harold Hedge, DO [facility]  01/06/2021, 7:11 AM

## 2021-01-06 NOTE — Progress Notes (Signed)
   01/06/21 1527  Assess: MEWS Score  Temp 99.9 F (37.7 C)  BP (!) 162/91  Pulse Rate (!) 103  Resp (!) 22  SpO2 97 %  O2 Device Room Air  Assess: MEWS Score  MEWS Temp 0  MEWS Systolic 0  MEWS Pulse 1  MEWS RR 1  MEWS LOC 0  MEWS Score 2  MEWS Score Color Yellow  Assess: if the MEWS score is Yellow or Red  Were vital signs taken at a resting state? Yes  Focused Assessment No change from prior assessment  Early Detection of Sepsis Score *See Row Information* High  MEWS guidelines implemented *See Row Information* Yes  Treat  MEWS Interventions Administered prn meds/treatments  Pain Scale 0-10  Pain Score 0  Take Vital Signs  Increase Vital Sign Frequency  Yellow: Q 2hr X 2 then Q 4hr X 2, if remains yellow, continue Q 4hrs  Escalate  MEWS: Escalate Yellow: discuss with charge nurse/RN and consider discussing with provider and RRT  Notify: Charge Nurse/RN  Name of Charge Nurse/RN Notified [place], RN  Date Charge Nurse/RN Notified 01/06/21  Time Charge Nurse/RN Notified 1548  Notify: Provider  Provider Name/Title Harold Hedge  Date Provider Notified 01/06/21  Time Provider Notified 1550  Notification Type Page  Notification Reason Other (Comment) (prn for hypertension)  Response See new orders  Date of Provider Response 01/06/21  Time of Provider Response 1550  Document  Patient Outcome Stabilized after interventions

## 2021-01-07 DIAGNOSIS — I129 Hypertensive chronic kidney disease with stage 1 through stage 4 chronic kidney disease, or unspecified chronic kidney disease: Secondary | ICD-10-CM | POA: Diagnosis present

## 2021-01-07 DIAGNOSIS — Z79899 Other long term (current) drug therapy: Secondary | ICD-10-CM | POA: Diagnosis not present

## 2021-01-07 DIAGNOSIS — E785 Hyperlipidemia, unspecified: Secondary | ICD-10-CM | POA: Diagnosis present

## 2021-01-07 DIAGNOSIS — Z8673 Personal history of transient ischemic attack (TIA), and cerebral infarction without residual deficits: Secondary | ICD-10-CM | POA: Diagnosis not present

## 2021-01-07 DIAGNOSIS — Z87891 Personal history of nicotine dependence: Secondary | ICD-10-CM | POA: Diagnosis not present

## 2021-01-07 DIAGNOSIS — A419 Sepsis, unspecified organism: Principal | ICD-10-CM

## 2021-01-07 DIAGNOSIS — J45909 Unspecified asthma, uncomplicated: Secondary | ICD-10-CM | POA: Diagnosis present

## 2021-01-07 DIAGNOSIS — N182 Chronic kidney disease, stage 2 (mild): Secondary | ICD-10-CM | POA: Diagnosis present

## 2021-01-07 DIAGNOSIS — J189 Pneumonia, unspecified organism: Secondary | ICD-10-CM | POA: Diagnosis present

## 2021-01-07 DIAGNOSIS — Z7982 Long term (current) use of aspirin: Secondary | ICD-10-CM | POA: Diagnosis not present

## 2021-01-07 DIAGNOSIS — F32A Depression, unspecified: Secondary | ICD-10-CM | POA: Diagnosis present

## 2021-01-07 DIAGNOSIS — Z882 Allergy status to sulfonamides status: Secondary | ICD-10-CM | POA: Diagnosis not present

## 2021-01-07 DIAGNOSIS — Z20822 Contact with and (suspected) exposure to covid-19: Secondary | ICD-10-CM | POA: Diagnosis present

## 2021-01-07 DIAGNOSIS — R509 Fever, unspecified: Secondary | ICD-10-CM | POA: Diagnosis not present

## 2021-01-07 DIAGNOSIS — D638 Anemia in other chronic diseases classified elsewhere: Secondary | ICD-10-CM | POA: Diagnosis present

## 2021-01-07 DIAGNOSIS — Z8619 Personal history of other infectious and parasitic diseases: Secondary | ICD-10-CM | POA: Diagnosis not present

## 2021-01-07 DIAGNOSIS — F419 Anxiety disorder, unspecified: Secondary | ICD-10-CM | POA: Diagnosis present

## 2021-01-07 DIAGNOSIS — F0390 Unspecified dementia without behavioral disturbance: Secondary | ICD-10-CM | POA: Diagnosis present

## 2021-01-07 LAB — BASIC METABOLIC PANEL
Anion gap: 10 (ref 5–15)
BUN: 15 mg/dL (ref 6–20)
CO2: 27 mmol/L (ref 22–32)
Calcium: 8.1 mg/dL — ABNORMAL LOW (ref 8.9–10.3)
Chloride: 100 mmol/L (ref 98–111)
Creatinine, Ser: 0.91 mg/dL (ref 0.61–1.24)
GFR, Estimated: >60 mL/min (ref >60)
Glucose, Bld: 108 mg/dL — ABNORMAL HIGH (ref 70–99)
Potassium: 3.9 mmol/L (ref 3.5–5.1)
Sodium: 137 mmol/L (ref 135–145)

## 2021-01-07 LAB — CBC
HCT: 27.9 % — ABNORMAL LOW (ref 39.0–52.0)
Hemoglobin: 8.9 g/dL — ABNORMAL LOW (ref 13.0–17.0)
MCH: 28.8 pg (ref 26.0–34.0)
MCHC: 31.9 g/dL (ref 30.0–36.0)
MCV: 90.3 fL (ref 80.0–100.0)
Platelets: 334 10*3/uL (ref 150–400)
RBC: 3.09 MIL/uL — ABNORMAL LOW (ref 4.22–5.81)
RDW: 14.7 % (ref 11.5–15.5)
WBC: 13.8 10*3/uL — ABNORMAL HIGH (ref 4.0–10.5)
nRBC: 0 % (ref 0.0–0.2)

## 2021-01-07 LAB — URINE CULTURE

## 2021-01-07 LAB — PROTIME-INR
INR: 1.3 — ABNORMAL HIGH (ref 0.8–1.2)
Prothrombin Time: 15.4 seconds — ABNORMAL HIGH (ref 11.4–15.2)

## 2021-01-07 LAB — PROCALCITONIN: Procalcitonin: 0.48 ng/mL

## 2021-01-07 MED ORDER — AZITHROMYCIN 250 MG PO TABS
500.0000 mg | ORAL_TABLET | Freq: Every day | ORAL | Status: DC
  Administered 2021-01-08 – 2021-01-09 (×2): 500 mg via ORAL
  Filled 2021-01-07 (×2): qty 2

## 2021-01-07 NOTE — Progress Notes (Signed)
PROGRESS NOTE    Darren Pearson  HFG:[id number] DOB: 1966/02/12 DOA: 01/06/2021 PCP: Seward Carol, MD   Chief Complaint  Patient presents with  . Fever  Brief Narrative: 54yom w/ HCV,HTN,CVA/Dementia,htn,asthma presented from nursing facility w/ fever for 2-3 days, fever up-to 102. As per report Per EMS, his temp was 102F en route.Patient has been coughing and short of breath. Currently he is unable to specify why he is in the hospital but is alert/oriented x 1 at baseline. He denies any complaints at this time. In ED- was febrile, tachycardic, tachypneic,  on room air O2 sat drops to low 90s on ambulation. Notable Labs: Na 139, K 3.8, BUN 17, Cr 1.0, AST 18, ALT 18, T bili 1.3, Lactic acid 1.0, WBC 17.3, Hb 9.9, Platelets 352, COVID 19 and flu negative. Notable Imaging: CXR - RML consolidation, underlying chronic bullous emphysema. Patient received Toradol, 2.25 L LR bolus and maintenance fluid, Ceftriaxone/Azithromycin      Subjective: Seen and examined this morning. He is alert awake able to tell me his name that he is at [facility] Does not know current date or current president follows commands appropriately Afebrile since admission. WBC downtrending  Assessment & Plan:  Sepsis due to pneumonia POA/right middle lobe pneumonia: Patient met sepsis criteria with fever tachycardia tachypnea and leukocytosis.  Continue on ceftriaxone/azithromycin Recent Labs  Lab 01/06/21 0450 01/07/21 0701  WBC 17.3* 13.8*  LATICACIDVEN 1.0  --    Anemia suspect from chronic disease.  Monitor H&H  Hypertension:Blood pressure is stable continue his lisinopril  Hyperlipidemia continue statin  Dementia at baseline,poor historian, continue supportive care fall precaution, reorientation delirium precaution. He appears at baseline.  History of CVAx2/TIA- has been less mobile, walks slow since his strokes, has balance issues nad has had falls.Cont aspirin statin. PTOT.  Nutrition: Diet  Order            Diet Heart Room service appropriate? Yes; Fluid consistency: Thin  Diet effective now               Pt's Body mass index is 22.17 kg/m. DVT prophylaxis: enoxaparin (LOVENOX) injection 40 mg Start: 01/06/21 2200 Code Status:   Code Status: Full Code confirmed with sister. Encourged to d/w code status Family Communication: plan of care discussed with patient at bedside.  Called his sister Darren Pearson and updated and answered questions.  Status is: admitted as Observation Patient remains hospitalized for ongoing management of pneumonia sepsis and at least will need 2 midnight stay to continue IV antibiotics pending further culture report, he will be changed to inpatient  Dispo:The patient is from: SNF            Anticipated d/c is to: SNF            Anticipated d/c date is: 2 days            Patient currently is not medically stable to d/c.            Difficult to place patient No  Consultants:see note  Procedures:see note  Unresulted Labs (From admission, onward)          Start     Ordered   01/13/21 0500  Creatinine, serum  (enoxaparin (LOVENOX)    CrCl >/= 30 ml/min)  Weekly,   R     Comments: while on enoxaparin therapy    01/06/21 0739   01/07/21 0802  Basic metabolic panel  Daily,   R  01/06/21 0739   01/07/21 0500  CBC  Daily,   R      01/06/21 0739   01/07/21 0500  Procalcitonin  Tomorrow morning,   R        01/06/21 0739   01/06/21 0453  Blood Culture (routine x 2)  (Undifferentiated presentation (screening labs and basic nursing orders))  BLOOD CULTURE X 2,   STAT      01/06/21 0453   01/06/21 0450  Urine culture  (Undifferentiated presentation (screening labs and basic nursing orders))  ONCE - STAT,   STAT        01/06/21 0453          Culture/Microbiology    Component Value Date/Time   SDES CSF 05/10/2020 1900   SDES CSF 05/10/2020 1900   SPECREQUEST NONE 05/10/2020 1900   SPECREQUEST ADDED ON AT 2051 BY MD 05/10/2020 05/10/2020 1900    CULT  05/10/2020 1900    NO GROWTH 3 DAYS Performed at [facility] [address] [place], [place] [address]    REPTSTATUS 05/10/2020 FINAL 05/10/2020 1900   REPTSTATUS 05/13/2020 FINAL 05/10/2020 1900    Other culture-see note  Medications: Scheduled Meds: . aspirin EC  81 mg Oral Daily  . atorvastatin  40 mg Oral q1800  . Chlorhexidine Gluconate Cloth  6 each Topical Q0600  . enoxaparin (LOVENOX) injection  40 mg Subcutaneous Q24H  . lisinopril  20 mg Oral Daily  . mupirocin ointment  1 application Nasal BID  . sertraline  50 mg Oral Daily  . sodium chloride flush  3 mL Intravenous Q12H   Continuous Infusions: . azithromycin 500 mg (01/07/21 0535)  . cefTRIAXone (ROCEPHIN)  IV 2 g (01/07/21 5456)    Antimicrobials: Anti-infectives (From admission, onward)   Start     Dose/Rate Route Frequency Ordered Stop   01/07/21 0600  cefTRIAXone (ROCEPHIN) 2 g in sodium chloride 0.9 % 100 mL IVPB        2 g 200 mL/hr over 30 Minutes Intravenous Every 24 hours 01/06/21 1658     01/07/21 0500  azithromycin (ZITHROMAX) 500 mg in sodium chloride 0.9 % 250 mL IVPB        500 mg 250 mL/hr over 60 Minutes Intravenous Every 24 hours 01/06/21 1658     01/06/21 0745  cefTRIAXone (ROCEPHIN) 2 g in sodium chloride 0.9 % 100 mL IVPB  Status:  Discontinued        2 g 200 mL/hr over 30 Minutes Intravenous Every 24 hours 01/06/21 0739 01/06/21 1658   01/06/21 0745  azithromycin (ZITHROMAX) 500 mg in sodium chloride 0.9 % 250 mL IVPB  Status:  Discontinued        500 mg 250 mL/hr over 60 Minutes Intravenous Every 24 hours 01/06/21 0739 01/06/21 1658   01/06/21 0545  cefTRIAXone (ROCEPHIN) 1 g in sodium chloride 0.9 % 100 mL IVPB        1 g 200 mL/hr over 30 Minutes Intravenous  Once 01/06/21 0544 01/06/21 0729   01/06/21 0545  azithromycin (ZITHROMAX) 500 mg in sodium chloride 0.9 % 250 mL IVPB        500 mg 250 mL/hr over 60 Minutes Intravenous  Once 01/06/21 0544 01/06/21 0731      Objective: Vitals: Today's Vitals   01/06/21 1948 01/06/21 2356 01/07/21 0019 01/07/21 0419  BP: 135/78 (!) 158/87  (!) 143/93  Pulse: 84 (!) 104  91  Resp: 16 16  18   Temp: 98.9 F (  37.2 C) 99.4 F (37.4 C)  98.7 F (37.1 C)  TempSrc: Oral Oral  Oral  SpO2: 96% 95%  100%  Weight:    68.1 kg  PainSc:   Asleep     Intake/Output Summary (Last 24 hours) at 01/07/2021 0757 Last data filed at 01/07/2021 0420 Gross per 24 hour  Intake 320 ml  Output 500 ml  Net -180 ml   Filed Weights   01/07/21 0419  Weight: 68.1 kg   Weight change:   Intake/Output from previous day: 02/13 0701 - 02/14 0700 In: 2920 [P.O.:320; IV Piggyback:2600] Out: 500 [Urine:500] Intake/Output this shift: No intake/output data recorded. Filed Weights   01/07/21 0419  Weight: 68.1 kg    Examination: General exam: AAOx2 ,NAD, weak appearing. HEENT:Oral mucosa moist, Ear/Nose WNL grossly,dentition normal. Respiratory system: Crackles in the right posterior lung,no wheezing or crackles,no use of accessory muscle, non tender. Cardiovascular system: S1 & S2 +, regular, No JVD. Gastrointestinal system: Abdomen soft, NT,ND, BS+. Nervous System:Alert, awake, moving extremities and grossly nonfocal Extremities: No edema, distal peripheral pulses palpable.  Amputated fingers on the left Skin: No rashes,no icterus. MSK: Normal muscle bulk,tone, power  Data Reviewed: I have personally reviewed following labs and imaging studies CBC: Recent Labs  Lab 01/06/21 0450 01/07/21 0701  WBC 17.3* 13.8*  NEUTROABS 14.4*  --   HGB 9.9* 8.9*  HCT 31.0* 27.9*  MCV 90.4 90.3  PLT 352 903   Basic Metabolic Panel: Recent Labs  Lab 01/06/21 0450 01/07/21 0701  NA 139 137  K 3.8 3.9  CL 104 100  CO2 25 27  GLUCOSE 126* 108*  BUN 17 15  CREATININE 1.00 0.91  CALCIUM 8.4* 8.1*   GFR: CrCl cannot be calculated (Unknown ideal weight.). Liver Function Tests: Recent Labs  Lab 01/06/21 0450  AST 18  ALT  18  ALKPHOS 90  BILITOT 1.3*  PROT 7.7  ALBUMIN 2.5*   No results for input(s): LIPASE, AMYLASE in the last 168 hours. No results for input(s): AMMONIA in the last 168 hours. Coagulation Profile: Recent Labs  Lab 01/06/21 0450 01/07/21 0701  INR 1.2 1.3*   Cardiac Enzymes: No results for input(s): CKTOTAL, CKMB, CKMBINDEX, TROPONINI in the last 168 hours. BNP (last 3 results) No results for input(s): PROBNP in the last 8760 hours. HbA1C: No results for input(s): HGBA1C in the last 72 hours. CBG: No results for input(s): GLUCAP in the last 168 hours. Lipid Profile: No results for input(s): CHOL, HDL, LDLCALC, TRIG, CHOLHDL, LDLDIRECT in the last 72 hours. Thyroid Function Tests: No results for input(s): TSH, T4TOTAL, FREET4, T3FREE, THYROIDAB in the last 72 hours. Anemia Panel: No results for input(s): VITAMINB12, FOLATE, FERRITIN, TIBC, IRON, RETICCTPCT in the last 72 hours. Sepsis Labs: Recent Labs  Lab 01/06/21 0450  LATICACIDVEN 1.0    Recent Results (from the past 240 hour(s))  Resp Panel by RT-PCR (Flu A&B, Covid) Nasopharyngeal Swab     Status: None   Collection Time: 01/06/21  4:53 AM   Specimen: Nasopharyngeal Swab; Nasopharyngeal(NP) swabs in vial transport medium  Result Value Ref Range Status   SARS Coronavirus 2 by RT PCR NEGATIVE NEGATIVE Final    Comment: (NOTE) SARS-CoV-2 target nucleic acids are NOT DETECTED.  The SARS-CoV-2 RNA is generally detectable in upper respiratory specimens during the acute phase of infection. The lowest concentration of SARS-CoV-2 viral copies this assay can detect is 138 copies/mL. A negative result does not preclude SARS-Cov-2 infection and should not be used  as the sole basis for treatment or other patient management decisions. A negative result may occur with  improper specimen collection/handling, submission of specimen other than nasopharyngeal swab, presence of viral mutation(s) within the areas targeted by this  assay, and inadequate number of viral copies(<138 copies/mL). A negative result must be combined with clinical observations, patient history, and epidemiological information. The expected result is Negative.  Fact Sheet for Patients:  EntrepreneurPulse.com.au  Fact Sheet for Healthcare Providers:  IncredibleEmployment.be  This test is no t yet approved or cleared by the [place] FDA and  has been authorized for detection and/or diagnosis of SARS-CoV-2 by FDA under an Emergency Use Authorization (EUA). This EUA will remain  in effect (meaning this test can be used) for the duration of the COVID-19 declaration under Section 564(b)(1) of the Act, 21 U.S.C.section 360bbb-3(b)(1), unless the authorization is terminated  or revoked sooner.       Influenza A by PCR NEGATIVE NEGATIVE Final   Influenza B by PCR NEGATIVE NEGATIVE Final    Comment: (NOTE) The Xpert Xpress SARS-CoV-2/FLU/RSV plus assay is intended as an aid in the diagnosis of influenza from Nasopharyngeal swab specimens and should not be used as a sole basis for treatment. Nasal washings and aspirates are unacceptable for Xpert Xpress SARS-CoV-2/FLU/RSV testing.  Fact Sheet for Patients: EntrepreneurPulse.com.au  Fact Sheet for Healthcare Providers: IncredibleEmployment.be  This test is not yet approved or cleared by the [place] FDA and has been authorized for detection and/or diagnosis of SARS-CoV-2 by FDA under an Emergency Use Authorization (EUA). This EUA will remain in effect (meaning this test can be used) for the duration of the COVID-19 declaration under Section 564(b)(1) of the Act, 21 U.S.C. section 360bbb-3(b)(1), unless the authorization is terminated or revoked.  Performed at [facility], [place] [address], [place], [place] [address]   MRSA PCR Screening     Status: Abnormal   Collection Time: 01/06/21   4:19 PM   Specimen: Nasal Mucosa; Nasopharyngeal  Result Value Ref Range Status   MRSA by PCR POSITIVE (A) NEGATIVE Final    Comment:        The GeneXpert MRSA Assay (FDA approved for NASAL specimens only), is one component of a comprehensive MRSA colonization surveillance program. It is not intended to diagnose MRSA infection nor to guide or monitor treatment for MRSA infections. RESULT CALLED TO, READ BACK BY AND VERIFIED WITH: ROBINSON,K RN @1807  ON 01/06/21 JACKSON,K Performed at [facility], [place] [address] [place], [place] [address]      Radiology Studies: [facility] Chest [place] 1 View  Result Date: 01/06/2021 CLINICAL DATA:  55 year old male with possible sepsis. Fever for 2 days but reportedly negative for COVID-19. EXAM: PORTABLE CHEST 1 VIEW COMPARISON:  Portable chest 05/09/2020 and earlier. FINDINGS: Portable AP semi upright view at 0523 hours. Mildly lower lung volumes. Stable cardiac size and mediastinal contours. Chronic left chest cardiac event recorder or superficial to febrile later. Visualized tracheal air column is within normal limits. Chronic bullous emphysema in the upper lungs. No pneumothorax. No evidence of layering pleural effusion the right costophrenic angle but there is confluent, hazy abnormal right lower lung opacity which appears to abut the minor fissure. Partial obscuration of the right heart border. Right diaphragm remains visible. Stable lungs elsewhere. No acute osseous abnormality identified. IMPRESSION: 1. Abnormal right lower lung opacity, suspected to be right middle lobe consolidation. Favor Pneumonia in this clinical setting. PA and lateral chest radiographs would be complementary  when possible. No definite effusion. 2. Underlying chronic bullous Emphysema (ICD10-J43.9). Electronically Signed   By: Genevie Ann M.D.   On: 01/06/2021 05:38   DG Swallowing Func-Speech Pathology  Result Date: 01/06/2021 Objective Swallowing Evaluation: Type of  Study: MBS-Modified Barium Swallow Study  Patient Details Name: Darren Pearson MRN: [id number] Date of Birth: May 15, 1966 Today's Date: 01/06/2021 Time: SLP Start Time (ACUTE ONLY): 1455 -SLP Stop Time (ACUTE ONLY): 1515 SLP Time Calculation (min) (ACUTE ONLY): 20 min Past Medical History: Past Medical History: Diagnosis Date . Allergy  . Anemia  . Anxiety and depression  . Asthma  . CVA (cerebral vascular accident) ([place])  . Hepatitis C  . History of chicken pox  . Hypertension  Past Surgical History: Past Surgical History: Procedure Laterality Date . BUBBLE STUDY  11/22/2019  Procedure: BUBBLE STUDY;  Surgeon: Dorothy Spark, MD;  Location: [facility];  Service: Cardiovascular;; . LOOP RECORDER INSERTION N/A 11/22/2019  Procedure: LOOP RECORDER INSERTION;  Surgeon: Thompson Grayer, MD;  Location: [facility];  Service: Cardiovascular;  Laterality: N/A; . TEE WITHOUT CARDIOVERSION N/A 11/22/2019  Procedure: TRANSESOPHAGEAL ECHOCARDIOGRAM (TEE);  Surgeon: Dorothy Spark, MD;  Location: [facility] ENDOSCOPY;  Service: Cardiovascular;  Laterality: N/A; HPI: Patient is a 55 y.o. male with PMH: HCV, HTN, CVA, dementia, asthma who presented to ED from SNF where he resides in memory care unit, with 2-3 days of fever, coughing, SOB. EMS reported temperature of 102 en route to hospital.  MBS from June 2021 revealed normal swallow except for appearance of diverticulum like pouching at level of epiglottis and resulting in some fluid retention  Subjective: pleasant, cooperative Assessment / Plan / Recommendation CHL IP CLINICAL IMPRESSIONS 01/06/2021 Clinical Impression Patient presents with a mild oropharyngeal dysphagia with likely cognitive impact. Patient would intermittently hold liquids in mouth and was observed to be intently watching MBS video during testing. Mild delay in mastication and delay in oral transit with puree and regular solids bolus. Patient was not able to maintain bolus of thin liquids and barium tablet  but was able to swallow whole barium tablet when in spoon of puree (applesauce). Swallow initiation delays at level of vallecular sinus observed with puree and regular solids but not with liquids. Trace amount of vallecular and pyriform sinus residuals remained after initial swallow but fully cleared with subsequent swallows. SLP did observe small outpouching which would intermittently collect trace amount of liquids, though this would fully clear and did not appear to be significantly impacting swallow. (radiologist not present to confirm; this was observed by SLP in [facility] 08/2020. Patient did exhbiit some delayed coughing when reclined in bed after MBS and SLP suspects this is GERD related. Patient is safe to continue with Regular solids, thin liquids. SLP Visit Diagnosis Dysphagia, oropharyngeal phase (R13.12) Attention and concentration deficit following -- Frontal lobe and executive function deficit following -- Impact on safety and function Mild aspiration risk   CHL IP TREATMENT RECOMMENDATION 01/06/2021 Treatment Recommendations Therapy as outlined in treatment plan below   Prognosis 01/06/2021 Prognosis for Safe Diet Advancement Good Barriers to Reach Goals -- Barriers/Prognosis Comment -- CHL IP DIET RECOMMENDATION 01/06/2021 SLP Diet Recommendations Thin liquid;Regular solids Liquid Administration via Cup;Straw Medication Administration Whole meds with puree Compensations Minimize environmental distractions;Small sips/bites;Slow rate Postural Changes Seated upright at 90 degrees   CHL IP OTHER RECOMMENDATIONS 01/06/2021 Recommended Consults -- Oral Care Recommendations Oral care BID Other Recommendations --   CHL IP FOLLOW UP RECOMMENDATIONS 01/06/2021 Follow up Recommendations Skilled  Nursing facility;24 hour supervision/assistance   CHL IP FREQUENCY AND DURATION 01/06/2021 Speech Therapy Frequency (ACUTE ONLY) min 1 x/week Treatment Duration 1 week      CHL IP ORAL PHASE 01/06/2021 Oral Phase Impaired Oral -  Pudding Teaspoon -- Oral - Pudding Cup -- Oral - Honey Teaspoon -- Oral - Honey Cup -- Oral - Nectar Teaspoon -- Oral - Nectar Cup -- Oral - Nectar Straw -- Oral - Thin Teaspoon -- Oral - Thin Cup Holding of bolus;Delayed oral transit;Other (Comment) Oral - Thin Straw Holding of bolus Oral - Puree Holding of bolus;Premature spillage;Delayed oral transit Oral - Mech Soft -- Oral - Regular Delayed oral transit;Premature spillage Oral - Multi-Consistency -- Oral - Pill Decreased bolus cohesion;Delayed oral transit Oral Phase - Comment --  CHL IP PHARYNGEAL PHASE 01/06/2021 Pharyngeal Phase Impaired Pharyngeal- Pudding Teaspoon -- Pharyngeal -- Pharyngeal- Pudding Cup -- Pharyngeal -- Pharyngeal- Honey Teaspoon -- Pharyngeal -- Pharyngeal- Honey Cup -- Pharyngeal -- Pharyngeal- Nectar Teaspoon -- Pharyngeal -- Pharyngeal- Nectar Cup -- Pharyngeal -- Pharyngeal- Nectar Straw -- Pharyngeal -- Pharyngeal- Thin Teaspoon -- Pharyngeal -- Pharyngeal- Thin Cup Pharyngeal residue - valleculae;Pharyngeal residue - pyriform Pharyngeal Material does not enter airway Pharyngeal- Thin Straw Pharyngeal residue - valleculae;Pharyngeal residue - pyriform Pharyngeal Material does not enter airway Pharyngeal- Puree Delayed swallow initiation-vallecula Pharyngeal Material does not enter airway Pharyngeal- Mechanical Soft -- Pharyngeal -- Pharyngeal- Regular Delayed swallow initiation-vallecula Pharyngeal Material does not enter airway Pharyngeal- Multi-consistency -- Pharyngeal -- Pharyngeal- Pill WFL Pharyngeal Material does not enter airway Pharyngeal Comment Appearance of what appeared to be small outpouching/diverticulum in pharynx which would result in trace amounts of thin liquids to briefly remain before ultimately clearing with subsequent swallows.  CHL IP CERVICAL ESOPHAGEAL PHASE 01/06/2021 Cervical Esophageal Phase WFL Pudding Teaspoon -- Pudding Cup -- Honey Teaspoon -- Honey Cup -- Nectar Teaspoon -- Nectar Cup -- Nectar Straw  -- Thin Teaspoon -- Thin Cup -- Thin Straw -- Puree -- Mechanical Soft -- Regular -- Multi-consistency -- Pill -- Cervical Esophageal Comment -- Sonia Baller, MA, CCC-SLP 01/06/21 5:57 PM                LOS: 0 days   Antonieta Pert, MD [facility]  01/07/2021, 7:57 AM

## 2021-01-07 NOTE — Evaluation (Signed)
Physical Therapy Evaluation Patient Details Name: Darren Pearson MRN: [id number] DOB: December 06, 1965 Today's Date: 01/07/2021   History of Present Illness  55 yo male admitted with sepsis, pna. Hx of dementia, CVA 2021 with residual L sided weakness  Clinical Impression  On eval, pt required Mod assist +2 for bed mobility and Min assist to ambulate ~60 feet with a RW. Pt participated well. Recommend return to SNF.     Follow Up Recommendations SNF    Equipment Recommendations  None recommended by PT    Recommendations for Other Services       Precautions / Restrictions Precautions Precautions: Fall Precaution Comments: Hep C, L sided weakness Restrictions Weight Bearing Restrictions: No      Mobility  Bed Mobility Overal bed mobility: Needs Assistance Bed Mobility: Supine to Sit     Supine to sit: Mod assist;+2 for physical assistance;+2 for safety/equipment;HOB elevated     General bed mobility comments: Increased time. Multimodal cueing required. Assist for bil LEs, trunk, and to scoot to EOB    Transfers Overall transfer level: Needs assistance Equipment used: Rolling walker (2 wheeled) Transfers: Sit to/from Stand Sit to Stand: Min assist;From elevated surface         General transfer comment: Assist to rise, steady, control descent. VCs safety, technique, hand placement  Ambulation/Gait Ambulation/Gait assistance: Min assist Gait Distance (Feet): 60 Feet Assistive device: Rolling walker (2 wheeled) Gait Pattern/deviations: Step-through pattern;Decreased stride length     General Gait Details: Assist to steady throughout distance. Slow gait speed. Pt tolerated distance well.  Stairs            Wheelchair Mobility    Modified Rankin (Stroke Patients Only)       Balance Overall balance assessment: Needs assistance         Standing balance support: Bilateral upper extremity supported Standing balance-Leahy Scale: Poor                                Pertinent Vitals/Pain Pain Assessment: No/denies pain    Home Living Family/patient expects to be discharged to:: Skilled nursing facility                      Prior Function Level of Independence: Needs assistance   Gait / Transfers Assistance Needed: ambulatory with RW  ADL's / Homemaking Assistance Needed: Assist with ADLs        Hand Dominance        Extremity/Trunk Assessment   Upper Extremity Assessment Upper Extremity Assessment: Generalized weakness    Lower Extremity Assessment Lower Extremity Assessment: Generalized weakness    Cervical / Trunk Assessment Cervical / Trunk Assessment: Normal  Communication   Communication: No difficulties  Cognition Arousal/Alertness: Awake/alert Behavior During Therapy: WFL for tasks assessed/performed Overall Cognitive Status: History of cognitive impairments - at baseline                                        General Comments      Exercises     Assessment/Plan    PT Assessment Patient needs continued PT services  PT Problem List Decreased strength;Decreased mobility;Decreased balance;Decreased activity tolerance       PT Treatment Interventions DME instruction;Gait training;Therapeutic activities;Therapeutic exercise;Patient/family education;Balance training;Functional mobility training    PT Goals (Current goals can be found in the  Care Plan section)  Acute Rehab PT Goals Patient Stated Goal: none stated PT Goal Formulation: With patient Time For Goal Achievement: 01/21/21 Potential to Achieve Goals: Fair    Frequency Min 2X/week   Barriers to discharge        Co-evaluation               AM-PAC PT "6 Clicks" Mobility  Outcome Measure Help needed turning from your back to your side while in a flat bed without using bedrails?: A Little Help needed moving from lying on your back to sitting on the side of a flat bed without using bedrails?: A  Little Help needed moving to and from a bed to a chair (including a wheelchair)?: A Little Help needed standing up from a chair using your arms (e.g., wheelchair or bedside chair)?: A Little Help needed to walk in hospital room?: A Lot Help needed climbing 3-5 steps with a railing? : A Lot 6 Click Score: 16    End of Session   Activity Tolerance: Patient tolerated treatment well Patient left: in chair;with call bell/phone within reach;with chair alarm set   PT Visit Diagnosis: Muscle weakness (generalized) (M62.81)    Time: 1444-1500 PT Time Calculation (min) (ACUTE ONLY): 16 min   Charges:   PT Evaluation $PT Eval Moderate Complexity: 1 Mod            Doreatha Massed, PT Acute Rehabilitation  Office: [phone] Pager: [phone]

## 2021-01-07 NOTE — NC FL2 (Signed)
[place] LEVEL OF CARE SCREENING TOOL     IDENTIFICATION  Patient Name: Darren Pearson Birthdate: March 19, 1966 Sex: male Admission Date (Current Location): 01/06/2021  [facility] and [place] Number:  Herbalist and Address:  [facility],  [place] [address] [place]      Provider Number: [id number]  Attending Physician Name and Address:  Antonieta Pert, MD  Relative Name and Phone Number:       Current Level of Care: Hospital Recommended Level of Care: [place] Prior Approval Number:    Date Approved/Denied:   PASRR Number:    Discharge Plan: SNF    Current Diagnoses: Patient Active Problem List   Diagnosis Date Noted  . Pneumonia 01/07/2021  . Sepsis due to pneumonia ([place]) 01/06/2021  . AKI (acute kidney injury) ([place]) 05/16/2020  . CKD (chronic kidney disease), stage II 05/16/2020  . Episode of unresponsiveness 05/09/2020  . Symptomatic anemia 05/09/2020  . History of CVA (cerebrovascular accident) 05/09/2020  . Elevated troponin 05/09/2020  . CVA (cerebral vascular accident) ([place]) 11/20/2019  . Hypokalemia 11/20/2019  . Accelerated hypertension 11/20/2019  . Hyperammonemia ([place]) 11/20/2019  . Elevated serum creatinine 11/20/2019  . Elevated blood pressure 03/15/2015  . Rash and nonspecific skin eruption 03/15/2015  . Hepatitis C 04/12/2013  . Allergic rhinitis 04/12/2013  . Unspecified asthma(493.90) 04/12/2013    Orientation RESPIRATION BLADDER Height & Weight     Self  Normal Incontinent Weight: 150 lb 2.1 oz (68.1 kg) Height:     BEHAVIORAL SYMPTOMS/MOOD NEUROLOGICAL BOWEL NUTRITION STATUS      Incontinent Diet (see dc summary)  AMBULATORY STATUS COMMUNICATION OF NEEDS Skin   Extensive Assist Verbally Normal                       Personal Care Assistance Level of Assistance  Bathing,Feeding,Dressing Bathing Assistance: Maximum assistance Feeding assistance: Limited assistance Dressing  Assistance: Maximum assistance     Functional Limitations Info  Sight,Hearing,Speech Sight Info: Adequate Hearing Info: Adequate Speech Info: Adequate    SPECIAL CARE FACTORS FREQUENCY                       Contractures Contractures Info: Not present    Additional Factors Info  Code Status,Allergies Code Status Info: Full Allergies Info: Sulfa Antibiotics           Current Medications (01/07/2021):  This is the current hospital active medication list Current Facility-Administered Medications  Medication Dose Route Frequency Provider Last Rate Last Admin  . acetaminophen (TYLENOL) tablet 650 mg  650 mg Oral Q6H PRN Harold Hedge, MD   650 mg at 01/06/21 2357   Or  . acetaminophen (TYLENOL) suppository 650 mg  650 mg Rectal Q6H PRN Harold Hedge, MD      . albuterol (PROVENTIL) (2.5 MG/3ML) 0.083% nebulizer solution 2.5 mg  2.5 mg Nebulization Q2H PRN Harold Hedge, MD      . aspirin EC tablet 81 mg  81 mg Oral Daily Harold Hedge, MD   81 mg at 01/07/21 0939  . atorvastatin (LIPITOR) tablet 40 mg  40 mg Oral q1800 Harold Hedge, MD   40 mg at 01/06/21 1835  . azithromycin (ZITHROMAX) 500 mg in sodium chloride 0.9 % 250 mL IVPB  500 mg Intravenous Q24H Minda Ditto, RPH 250 mL/hr at 01/07/21 0535 500 mg at 01/07/21 0535  . cefTRIAXone (ROCEPHIN) 2 g in  sodium chloride 0.9 % 100 mL IVPB  2 g Intravenous Q24H Minda Ditto, RPH 200 mL/hr at 01/07/21 0649 2 g at 01/07/21 0649  . Chlorhexidine Gluconate Cloth 2 % PADS 6 each  6 each Topical Q0600 Harold Hedge, MD   6 each at 01/07/21 [phone]  . enoxaparin (LOVENOX) injection 40 mg  40 mg Subcutaneous Q24H Harold Hedge, MD   40 mg at 01/06/21 2225  . labetalol (NORMODYNE) injection 10 mg  10 mg Intravenous Q2H PRN Harold Hedge, MD   10 mg at 01/06/21 1640  . lisinopril (ZESTRIL) tablet 20 mg  20 mg Oral Daily Harold Hedge, MD   20 mg at 01/07/21 0939  . mupirocin ointment (BACTROBAN) 2 % 1 application  1 application  Nasal BID Harold Hedge, MD   1 application at 92/44/62 [phone]  . polyethylene glycol (MIRALAX / GLYCOLAX) packet 17 g  17 g Oral Daily PRN Harold Hedge, MD      . sertraline (ZOLOFT) tablet 50 mg  50 mg Oral Daily Harold Hedge, MD   50 mg at 01/07/21 0939  . sodium chloride flush (NS) 0.9 % injection 3 mL  3 mL Intravenous Q12H Harold Hedge, MD   3 mL at 01/06/21 2225     Discharge Medications: Please see discharge summary for a list of discharge medications.  Relevant Imaging Results:  Relevant Lab Results:   Additional Information    Shade Flood, LCSW

## 2021-01-07 NOTE — TOC Initial Note (Signed)
Transition of Care [facility]) - Initial/Assessment Note    Patient Details  Name: Darren Pearson MRN: [id number] Date of Birth: Nov 18, 1966  Transition of Care [facility]) CM/SW Contact:    Shade Flood, LCSW Phone Number: 01/07/2021, 11:02 AM  Clinical Narrative:                  Pt admitted from [facility] long term care. Pt status reviewed with MD who states pt will remain hospitalized for 2-3 days. Updated Shazma at [facility] Plan is for return to [facility] [employer].  Assigned TOC will follow for dc planning needs.  Expected Discharge Plan: Long Term Nursing Home Barriers to Discharge: Continued Medical Work up   Patient Goals and CMS Choice        Expected Discharge Plan and Services Expected Discharge Plan: [place] In-house Referral: Clinical Social Work     Living arrangements for the past 2 months: [place]                                      Prior Living Arrangements/Services Living arrangements for the past 2 months: [place] Lives with:: Facility Resident Patient language and need for interpreter reviewed:: Yes Do you feel safe going back to the place where you live?: Yes      Need for Family Participation in Patient Care: No (Comment) Care giver support system in place?: Yes (comment)   Criminal Activity/Legal Involvement Pertinent to Current Situation/Hospitalization: No - Comment as needed  Activities of Daily Living Home Assistive Devices/Equipment: Environmental consultant (specify type) ADL Screening (condition at time of admission) Patient's cognitive ability adequate to safely complete daily activities?: No Is the patient deaf or have difficulty hearing?: No Does the patient have difficulty seeing, even when wearing glasses/contacts?: No Does the patient have difficulty concentrating, remembering, or making decisions?: Yes Patient able to express need for assistance with ADLs?: No Does the patient have difficulty  dressing or bathing?: Yes Independently performs ADLs?: No Does the patient have difficulty walking or climbing stairs?: Yes Weakness of Legs: Right Weakness of Arms/Hands: Left  Permission Sought/Granted                  Emotional Assessment       Orientation: : Oriented to Self Alcohol / Substance Use: Not Applicable Psych Involvement: No (comment)  Admission diagnosis:  Sepsis due to pneumonia ([place]) [J18.9, A41.9] Community acquired pneumonia of right middle lobe of lung [J18.9] Sepsis, due to unspecified organism, unspecified whether acute organ dysfunction present ([place]) [A41.9] Pneumonia [J18.9] Patient Active Problem List   Diagnosis Date Noted  . Pneumonia 01/07/2021  . Sepsis due to pneumonia ([place]) 01/06/2021  . AKI (acute kidney injury) ([place]) 05/16/2020  . CKD (chronic kidney disease), stage II 05/16/2020  . Episode of unresponsiveness 05/09/2020  . Symptomatic anemia 05/09/2020  . History of CVA (cerebrovascular accident) 05/09/2020  . Elevated troponin 05/09/2020  . CVA (cerebral vascular accident) ([place]) 11/20/2019  . Hypokalemia 11/20/2019  . Accelerated hypertension 11/20/2019  . Hyperammonemia ([place]) 11/20/2019  . Elevated serum creatinine 11/20/2019  . Elevated blood pressure 03/15/2015  . Rash and nonspecific skin eruption 03/15/2015  . Hepatitis C 04/12/2013  . Allergic rhinitis 04/12/2013  . Unspecified asthma(493.90) 04/12/2013   PCP:  Seward Carol, MD Pharmacy:   [facility] [place]  [place] [address] Phone: [phone] Fax: [phone]     Social Determinants of Health (SDOH) Interventions    Readmission Risk Interventions Readmission Risk Prevention Plan 01/07/2021  Transportation Screening Complete  Home Care Screening Complete  Medication Review (RN CM) Complete  Some recent data might be hidden

## 2021-01-07 NOTE — Progress Notes (Signed)
PHARMACIST - PHYSICIAN COMMUNICATION  CONCERNING: Antibiotic IV to Oral Route Change Policy  RECOMMENDATION: This patient is receiving azithromycin by the intravenous route.  Based on criteria approved by the Pharmacy and Therapeutics Committee, the antibiotic(s) is/are being converted to the equivalent oral dose form(s).   DESCRIPTION: These criteria include:  Patient being treated for a respiratory tract infection, urinary tract infection, cellulitis or clostridium difficile associated diarrhea if on metronidazole  The patient is not neutropenic and does not exhibit a GI malabsorption state  The patient is eating (either orally or via tube) and/or has been taking other orally administered medications for a least 24 hours  The patient is improving clinically and has a Tmax < 100.5  If you have questions about this conversion, please contact the Pharmacy Department  []  ( [phone] )  [place] []  ( [phone] )  [facility] []  ( [phone]  [place] []  ( [phone] )  [facility] [x]  ( [phone] )  [facility]  

## 2021-01-08 DIAGNOSIS — A419 Sepsis, unspecified organism: Secondary | ICD-10-CM | POA: Diagnosis not present

## 2021-01-08 DIAGNOSIS — J189 Pneumonia, unspecified organism: Secondary | ICD-10-CM | POA: Diagnosis not present

## 2021-01-08 LAB — BASIC METABOLIC PANEL
Anion gap: 10 (ref 5–15)
BUN: 15 mg/dL (ref 6–20)
CO2: 25 mmol/L (ref 22–32)
Calcium: 8.2 mg/dL — ABNORMAL LOW (ref 8.9–10.3)
Chloride: 102 mmol/L (ref 98–111)
Creatinine, Ser: 0.81 mg/dL (ref 0.61–1.24)
GFR, Estimated: >60 mL/min (ref >60)
Glucose, Bld: 106 mg/dL — ABNORMAL HIGH (ref 70–99)
Potassium: 3.7 mmol/L (ref 3.5–5.1)
Sodium: 137 mmol/L (ref 135–145)

## 2021-01-08 LAB — CBC
HCT: 26.9 % — ABNORMAL LOW (ref 39.0–52.0)
Hemoglobin: 8.6 g/dL — ABNORMAL LOW (ref 13.0–17.0)
MCH: 28.4 pg (ref 26.0–34.0)
MCHC: 32 g/dL (ref 30.0–36.0)
MCV: 88.8 fL (ref 80.0–100.0)
Platelets: 339 10*3/uL (ref 150–400)
RBC: 3.03 MIL/uL — ABNORMAL LOW (ref 4.22–5.81)
RDW: 14.7 % (ref 11.5–15.5)
WBC: 11 10*3/uL — ABNORMAL HIGH (ref 4.0–10.5)
nRBC: 0 % (ref 0.0–0.2)

## 2021-01-08 NOTE — Evaluation (Signed)
Occupational Therapy Evaluation Patient Details Name: Darren Pearson MRN: [id number] DOB: [birth year] Today's Date: 01/08/2021    History of Present Illness 55 yo male admitted with sepsis, pna. Hx of dementia, CVA 2021 with residual L sided weakness   Clinical Impression   Pt admitted with above. He demonstrates the below listed deficits and will benefit from continued OT to maximize safety and independence with BADLs.  Pt presents to OT with generalized weakness, decreased balance, impaired cognition.  He currently requires min A - total A for ADLs.  He was incontinent of large amount of watery stool, and was assisted in clean up.  He is a resident of SNF, and recommend return to SNF at discharge.        Follow Up Recommendations  SNF    Equipment Recommendations  None recommended by OT    Recommendations for Other Services       Precautions / Restrictions Precautions Precautions: Fall Precaution Comments: Hep C, L sided weakness Restrictions Weight Bearing Restrictions: No      Mobility Bed Mobility Overal bed mobility: Needs Assistance Bed Mobility: Sit to Supine     Supine to sit: Mod assist;HOB elevated     General bed mobility comments: assist to move LEs off the bed and assist to lift trunk.  requires increased time to initiate activity    Transfers Overall transfer level: Needs assistance Equipment used: Rolling walker (2 wheeled) Transfers: Sit to/from [facility] Sit to Stand: Min assist Stand pivot transfers: Min assist       General transfer comment: assist to power up into standing and assist to steady    Balance Overall balance assessment: Needs assistance         Standing balance support: Bilateral upper extremity supported Standing balance-Leahy Scale: Poor Standing balance comment: requires UE support and min guard assist                           ADL either performed or assessed with clinical judgement    ADL Overall ADL's : Needs assistance/impaired Eating/Feeding: Set up;Supervision/ safety;Bed level;Sitting Eating/Feeding Details (indicate cue type and reason): slow to process and proceed wtih task Grooming: Wash/dry hands;Wash/dry face;Brushing hair;Minimal assistance;Sitting   Upper Body Bathing: Minimal assistance;Sitting   Lower Body Bathing: Maximal assistance;Sit to/from stand   Upper Body Dressing : Maximal assistance;Sitting   Lower Body Dressing: Total assistance;Sit to/from stand   Toilet Transfer: Minimal assistance;Stand-pivot;BSC;RW Toilet Transfer Details (indicate cue type and reason): assist to boost into standing and assist for balance Toileting- Clothing Manipulation and Hygiene: Total assistance;Sit to/from stand Toileting - Clothing Manipulation Details (indicate cue type and reason): Pt incontinent of large, watery stool.  He was assisted with peri care in standing     Functional mobility during ADLs: Minimal assistance;Rolling walker       Vision   Additional Comments: not formally assessed this date.  Pt with difficulty following multi step commands and sustaining attention     Perception Perception Perception Tested?: No   Praxis Praxis Praxis tested?: Deficits Deficits: Initiation    Pertinent Vitals/Pain Pain Assessment: No/denies pain     Hand Dominance Left (Per info from previous admission)   Extremity/Trunk Assessment Upper Extremity Assessment Upper Extremity Assessment: LUE deficits/detail LUE Deficits / Details: residual Lt sided weakness.  is able to use Lt UE as an active assist   Lower Extremity Assessment Lower Extremity Assessment: Defer to PT evaluation   Cervical /  Trunk Assessment Cervical / Trunk Assessment: Normal   Communication Communication Communication: No difficulties   Cognition Arousal/Alertness: Awake/alert Behavior During Therapy: WFL for tasks assessed/performed Overall Cognitive Status: No  family/caregiver present to determine baseline cognitive functioning                                 General Comments: Pt with h/o cognitive deficits   General Comments       Exercises     Shoulder Instructions      Home Living Family/patient expects to be discharged to:: Skilled nursing facility                                 Additional Comments: Pt is a resident of SNF      Prior Functioning/Environment Level of Independence: Needs assistance  Gait / Transfers Assistance Needed: ambulatory with RW ADL's / Homemaking Assistance Needed: Assist with ADLs            OT Problem List: Decreased strength;Impaired balance (sitting and/or standing);Decreased activity tolerance;Decreased cognition;Decreased coordination;Decreased safety awareness;Impaired UE functional use      OT Treatment/Interventions:      OT Goals(Current goals can be found in the care plan section) Acute Rehab OT Goals Patient Stated Goal: none stated OT Goal Formulation: With patient Time For Goal Achievement: 01/22/21 Potential to Achieve Goals: Good ADL Goals Pt Will Perform Grooming: with min guard assist;standing Pt Will Perform Upper Body Bathing: with set-up;with supervision;sitting Pt Will Transfer to Toilet: with min guard assist;ambulating;bedside commode;grab bars Pt Will Perform Toileting - Clothing Manipulation and hygiene: with min guard assist;sit to/from stand  OT Frequency:     Barriers to D/C:            Co-evaluation              AM-PAC OT "6 Clicks" Daily Activity     Outcome Measure Help from another person eating meals?: A Little Help from another person taking care of personal grooming?: A Little Help from another person toileting, which includes using toliet, bedpan, or urinal?: A Lot Help from another person bathing (including washing, rinsing, drying)?: A Lot Help from another person to put on and taking off regular upper body  clothing?: A Lot Help from another person to put on and taking off regular lower body clothing?: Total 6 Click Score: 13   End of Session Equipment Utilized During Treatment: Rolling walker Nurse Communication: Mobility status  Activity Tolerance: Patient tolerated treatment well Patient left: in chair;with call bell/phone within reach;with chair alarm set;with nursing/sitter in room  OT Visit Diagnosis: Unsteadiness on feet (R26.81);Cognitive communication deficit (R41.841) Symptoms and signs involving cognitive functions: Cerebral infarction                Time: 0932-3557 OT Time Calculation (min): 31 min Charges:  OT General Charges $OT Visit: 1 Visit OT Evaluation $OT Eval Moderate Complexity: 1 Mod OT Treatments $Self Care/Home Management : 8-22 mins  Nilsa Nutting., OTR/L Acute Rehabilitation Services Pager [phone] Office [phone]   Lucille Passy M 01/08/2021, 11:09 AM

## 2021-01-08 NOTE — TOC Progression Note (Signed)
Transition of Care [facility]) - Progression Note    Patient Details  Name: Darren Pearson MRN: [id number] Date of Birth: 07-Jun-1966  [facility] of [facility]) CM/SW Contact  Eulalah Rupert, Marjie Skiff, RN Phone Number: 01/08/2021, 2:11 PM  Clinical Narrative:    [facility] liaison contacted to alert of potential dc tomorrow. PT/OT notes added to Hub for Liaison to begin auth with Medicaid.   Expected Discharge Plan: Long Term Nursing Home Barriers to Discharge: Continued Medical Work up  Expected Discharge Plan and Services Expected Discharge Plan: [place] In-house Referral: Clinical Social Work     Living arrangements for the past 2 months: [place]                   Readmission Risk Interventions Readmission Risk Prevention Plan 01/07/2021  Transportation Screening Complete  Home Care Screening Complete  Medication Review (RN CM) Complete  Some recent data might be hidden

## 2021-01-08 NOTE — Progress Notes (Signed)
PROGRESS NOTE    Darren Pearson  DGL:[id number] DOB: [birth year] DOA: 01/06/2021 PCP: Seward Carol, MD   Chief Complaint  Patient presents with  . Fever  Brief Narrative: 54yom w/ HCV,HTN,CVA/Dementia,htn,asthma presented from nursing facility w/ fever for 2-3 days, fever up-to 102. As per report Per EMS, his temp was 102F en route.Patient has been coughing and short of breath. Currently he is unable to specify why he is in the hospital but is alert/oriented x 1 at baseline. He denies any complaints at this time. In ED- was febrile, tachycardic, tachypneic,  on room air O2 sat drops to low 90s on ambulation. Notable Labs: Na 139, K 3.8, BUN 17, Cr 1.0, AST 18, ALT 18, T bili 1.3, Lactic acid 1.0, WBC 17.3, Hb 9.9, Platelets 352, COVID 19 and flu negative. Notable Imaging: CXR - RML consolidation, underlying chronic bullous emphysema. Patient received Toradol, 2.25 L LR bolus and maintenance fluid, Ceftriaxone/Azithromycin      Subjective: Seen and examined.  No new complaints, with baseline dementia alert awake somewhat communicative, was self-feeding himself  Afebrile and WBC improving Assessment & Plan:  Sepsis due to pneumonia POA/right middle lobe pneumonia: Sepsis physiology resolved.  WBC count normalizing.  Continue on ceftriaxone/azithromycin.  Blood culture no growth so far. He met sepsis criteria with fever tachycardia tachypnea and leukocytosis on admission. Recent Labs  Lab 01/06/21 0450 01/07/21 0701 01/08/21 0552  WBC 17.3* 13.8* 11.0*  LATICACIDVEN 1.0  --   --   PROCALCITON  --  0.48  --    Anemia likely from chronic disease.  Monitor Hypertension: Well-controlled on home lisinopril.  Hyperlipidemia continue statin Dementia at baseline,poor historian, but alert awake communicative follows commands.  Continue delirium precaution.  Continue Zoloft History of CVAx2/TIA- has been less mobile, walks slow since his strokes, has balance issues nad has had falls.Cont aspirin  statin. PTOT to continue and will need a skilled nursing facility placement.  Nutrition: Diet Order            Diet Heart Room service appropriate? Yes; Fluid consistency: Thin  Diet effective now               Pt's Body mass index is 21.85 kg/m. DVT prophylaxis: enoxaparin (LOVENOX) injection 40 mg Start: 01/06/21 2200 Code Status:   Code Status: Full Code confirmed with sister. Encourged to d/w code status Family Communication: plan of care discussed with patient at bedside.  Called his sister [name] and updated and answered questions 2/14 .  Status is: Inpatient Patient remains hospitalized for ongoing management of pneumonia and will need a skilled nursing facility at discharge.  Dispo:The patient is from: SNF            Anticipated d/c is to: SNF            Anticipated d/c date is:1- 2 days            Patient currently is not medically stable to d/c.            Difficult to place patient No  Consultants:see note  Procedures:see note  Unresulted Labs (From admission, onward)          Start     Ordered   01/13/21 0500  Creatinine, serum  (enoxaparin (LOVENOX)    CrCl >/= 30 ml/min)  Weekly,   R     Comments: while on enoxaparin therapy    01/06/21 0739   01/07/21 5188  Basic metabolic panel  Daily,   R  01/06/21 0739   01/07/21 0500  CBC  Daily,   R      01/06/21 0739          Culture/Microbiology    Component Value Date/Time   SDES  01/06/2021 0458    BLOOD LEFT WRIST Performed at [facility], [place] [address], [place], [place] [address]    SPECREQUEST  01/06/2021 0458    BOTTLES DRAWN AEROBIC AND ANAEROBIC Blood Culture adequate volume Performed at [facility] [address], [place], [place] [address]    CULT  01/06/2021 0458    NO GROWTH 1 DAY Performed at [facility] [address], [place], [place] [address]    REPTSTATUS PENDING 01/06/2021 9528    Other culture-see  note  Medications: Scheduled Meds: . aspirin EC  81 mg Oral Daily  . atorvastatin  40 mg Oral q1800  . azithromycin  500 mg Oral Daily  . Chlorhexidine Gluconate Cloth  6 each Topical Q0600  . enoxaparin (LOVENOX) injection  40 mg Subcutaneous Q24H  . lisinopril  20 mg Oral Daily  . mupirocin ointment  1 application Nasal BID  . sertraline  50 mg Oral Daily  . sodium chloride flush  3 mL Intravenous Q12H   Continuous Infusions: . cefTRIAXone (ROCEPHIN)  IV 2 g (01/08/21 0514)    Antimicrobials: Anti-infectives (From admission, onward)   Start     Dose/Rate Route Frequency Ordered Stop   01/08/21 1000  azithromycin (ZITHROMAX) tablet 500 mg        500 mg Oral Daily 01/07/21 1114     01/07/21 0600  cefTRIAXone (ROCEPHIN) 2 g in sodium chloride 0.9 % 100 mL IVPB        2 g 200 mL/hr over 30 Minutes Intravenous Every 24 hours 01/06/21 1658     01/07/21 0500  azithromycin (ZITHROMAX) 500 mg in sodium chloride 0.9 % 250 mL IVPB  Status:  Discontinued        500 mg 250 mL/hr over 60 Minutes Intravenous Every 24 hours 01/06/21 1658 01/07/21 1114   01/06/21 0745  cefTRIAXone (ROCEPHIN) 2 g in sodium chloride 0.9 % 100 mL IVPB  Status:  Discontinued        2 g 200 mL/hr over 30 Minutes Intravenous Every 24 hours 01/06/21 0739 01/06/21 1658   01/06/21 0745  azithromycin (ZITHROMAX) 500 mg in sodium chloride 0.9 % 250 mL IVPB  Status:  Discontinued        500 mg 250 mL/hr over 60 Minutes Intravenous Every 24 hours 01/06/21 0739 01/06/21 1658   01/06/21 0545  cefTRIAXone (ROCEPHIN) 1 g in sodium chloride 0.9 % 100 mL IVPB        1 g 200 mL/hr over 30 Minutes Intravenous  Once 01/06/21 0544 01/06/21 0729   01/06/21 0545  azithromycin (ZITHROMAX) 500 mg in sodium chloride 0.9 % 250 mL IVPB        500 mg 250 mL/hr over 60 Minutes Intravenous  Once 01/06/21 0544 01/06/21 0731     Objective: Vitals: Today's Vitals   01/08/21 0000 01/08/21 0500 01/08/21 0507 01/08/21 0800  BP:   137/69    Pulse:   88   Resp:   19   Temp:   99.2 F (37.3 C)   TempSrc:   Oral   SpO2:   97%   Weight:  67.1 kg    PainSc: Asleep   0-No pain    Intake/Output Summary (Last 24 hours) at  01/08/2021 1147 Last data filed at 01/08/2021 0900 Gross per 24 hour  Intake 240 ml  Output 1000 ml  Net -760 ml   Filed Weights   01/07/21 0419 01/08/21 0500  Weight: 68.1 kg 67.1 kg   Weight change: -1 kg  Intake/Output from previous day: 02/14 0701 - 02/15 0700 In: 240 [P.O.:240] Out: 1000 [Urine:1000] Intake/Output this shift: Total I/O In: 240 [P.O.:240] Out: -  Filed Weights   01/07/21 0419 01/08/21 0500  Weight: 68.1 kg 67.1 kg    Examination: General exam: Franchot Heidelberg, old for his age, NAD, weak appearing. HEENT:Oral mucosa moist, Ear/Nose WNL grossly, dentition normal. Respiratory system: bilaterally basal crackles,no wheezing or crackles,no use of accessory muscle Cardiovascular system: S1 & S2 +, No JVD,. Gastrointestinal system: Abdomen soft, NT,ND, BS+ Nervous System:Alert, awake, with baseline memory issues/dementia, moving extremities and grossly nonfocal Extremities: No edema, distal peripheral pulses palpable.  Skin: No rashes,no icterus. MSK: Normal muscle bulk,tone, power    Data Reviewed: I have personally reviewed following labs and imaging studies CBC: Recent Labs  Lab 01/06/21 0450 01/07/21 0701 01/08/21 0552  WBC 17.3* 13.8* 11.0*  NEUTROABS 14.4*  --   --   HGB 9.9* 8.9* 8.6*  HCT 31.0* 27.9* 26.9*  MCV 90.4 90.3 88.8  PLT 352 334 409   Basic Metabolic Panel: Recent Labs  Lab 01/06/21 0450 01/07/21 0701 01/08/21 0552  NA 139 137 137  K 3.8 3.9 3.7  CL 104 100 102  CO2 _0 GLUCOSE 126* 108* 106*  BUN _1 CREATININE 1.00 0.91 0.81  CALCIUM 8.4* 8.1* 8.2*   GFR: CrCl cannot be calculated (Unknown ideal weight.). Liver Function Tests: Recent Labs  Lab 01/06/21 0450  AST 18  ALT 18  ALKPHOS 90  BILITOT 1.3*  PROT 7.7  ALBUMIN  2.5*   No results for input(s): LIPASE, AMYLASE in the last 168 hours. No results for input(s): AMMONIA in the last 168 hours. Coagulation Profile: Recent Labs  Lab 01/06/21 0450 01/07/21 0701  INR 1.2 1.3*   Cardiac Enzymes: No results for input(s): CKTOTAL, CKMB, CKMBINDEX, TROPONINI in the last 168 hours. BNP (last 3 results) No results for input(s): PROBNP in the last 8760 hours. HbA1C: No results for input(s): HGBA1C in the last 72 hours. CBG: No results for input(s): GLUCAP in the last 168 hours. Lipid Profile: No results for input(s): CHOL, HDL, LDLCALC, TRIG, CHOLHDL, LDLDIRECT in the last 72 hours. Thyroid Function Tests: No results for input(s): TSH, T4TOTAL, FREET4, T3FREE, THYROIDAB in the last 72 hours. Anemia Panel: No results for input(s): VITAMINB12, FOLATE, FERRITIN, TIBC, IRON, RETICCTPCT in the last 72 hours. Sepsis Labs: Recent Labs  Lab 01/06/21 0450 01/07/21 0701  PROCALCITON  --  0.48  LATICACIDVEN 1.0  --     Recent Results (from the past 240 hour(s))  Urine culture     Status: Abnormal   Collection Time: 01/06/21  4:50 AM   Specimen: In/Out Cath Urine  Result Value Ref Range Status   Specimen Description   Final    IN/OUT CATH URINE Performed at [facility], [place] [address], [place], [place] [address]    Special Requests   Final    NONE Performed at [facility], [place] [address] [place], [place] [address]    Culture MULTIPLE SPECIES PRESENT, SUGGEST RECOLLECTION (A)  Final   Report Status 01/07/2021 FINAL  Final  Blood Culture (routine x 2)  Status: None (Preliminary result)   Collection Time: 01/06/21  4:53 AM   Specimen: BLOOD  Result Value Ref Range Status   Specimen Description   Final    BLOOD BLOOD LEFT FOREARM Performed at [facility] [address], [place], [place] [address]    Special Requests   Final    BOTTLES DRAWN AEROBIC AND ANAEROBIC Blood Culture adequate  volume Performed at [facility] [address], [place], [place] [address]    Culture   Final    NO GROWTH 1 DAY Performed at [facility], [place] [address], [place], [place] [address]    Report Status PENDING  Incomplete  Resp Panel by RT-PCR (Flu A&B, Covid) Nasopharyngeal Swab     Status: None   Collection Time: 01/06/21  4:53 AM   Specimen: Nasopharyngeal Swab; Nasopharyngeal(NP) swabs in vial transport medium  Result Value Ref Range Status   SARS Coronavirus 2 by RT PCR NEGATIVE NEGATIVE Final    Comment: (NOTE) SARS-CoV-2 target nucleic acids are NOT DETECTED.  The SARS-CoV-2 RNA is generally detectable in upper respiratory specimens during the acute phase of infection. The lowest concentration of SARS-CoV-2 viral copies this assay can detect is 138 copies/mL. A negative result does not preclude SARS-Cov-2 infection and should not be used as the sole basis for treatment or other patient management decisions. A negative result may occur with  improper specimen collection/handling, submission of specimen other than nasopharyngeal swab, presence of viral mutation(s) within the areas targeted by this assay, and inadequate number of viral copies(<138 copies/mL). A negative result must be combined with clinical observations, patient history, and epidemiological information. The expected result is Negative.  Fact Sheet for Patients:  EntrepreneurPulse.com.au  Fact Sheet for Healthcare Providers:  IncredibleEmployment.be  This test is no t yet approved or cleared by the [place] FDA and  has been authorized for detection and/or diagnosis of SARS-CoV-2 by FDA under an Emergency Use Authorization (EUA). This EUA will remain  in effect (meaning this test can be used) for the duration of the COVID-19 declaration under Section 564(b)(1) of the Act, 21 U.S.C.section 360bbb-3(b)(1), unless the authorization is terminated   or revoked sooner.       Influenza A by PCR NEGATIVE NEGATIVE Final   Influenza B by PCR NEGATIVE NEGATIVE Final    Comment: (NOTE) The Xpert Xpress SARS-CoV-2/FLU/RSV plus assay is intended as an aid in the diagnosis of influenza from Nasopharyngeal swab specimens and should not be used as a sole basis for treatment. Nasal washings and aspirates are unacceptable for Xpert Xpress SARS-CoV-2/FLU/RSV testing.  Fact Sheet for Patients: EntrepreneurPulse.com.au  Fact Sheet for Healthcare Providers: IncredibleEmployment.be  This test is not yet approved or cleared by the [place] FDA and has been authorized for detection and/or diagnosis of SARS-CoV-2 by FDA under an Emergency Use Authorization (EUA). This EUA will remain in effect (meaning this test can be used) for the duration of the COVID-19 declaration under Section 564(b)(1) of the Act, 21 U.S.C. section 360bbb-3(b)(1), unless the authorization is terminated or revoked.  Performed at [facility], [place] [address] [place], [place] [address]   Blood Culture (routine x 2)     Status: None (Preliminary result)   Collection Time: 01/06/21  4:58 AM   Specimen: BLOOD  Result Value Ref Range Status   Specimen Description   Final    BLOOD LEFT WRIST Performed at [facility] [address] [place],  [place] [address]    Special Requests   Final    BOTTLES DRAWN AEROBIC AND ANAEROBIC Blood Culture adequate volume Performed at [facility] [address], [place], [place] [address]    Culture   Final    NO GROWTH 1 DAY Performed at [facility], [place] [address], [place], [place] [address]    Report Status PENDING  Incomplete  MRSA PCR Screening     Status: Abnormal   Collection Time: 01/06/21  4:19 PM   Specimen: Nasal Mucosa; Nasopharyngeal  Result Value Ref Range Status   MRSA by PCR POSITIVE (A) NEGATIVE Final     Comment:        The GeneXpert MRSA Assay (FDA approved for NASAL specimens only), is one component of a comprehensive MRSA colonization surveillance program. It is not intended to diagnose MRSA infection nor to guide or monitor treatment for MRSA infections. RESULT CALLED TO, READ BACK BY AND VERIFIED WITH: ROBINSON,K RN _0  ON 01/06/21 JACKSON,K Performed at [facility], [place] [address] [place], [place] [address]      Radiology Studies: DG Swallowing Func-Speech Pathology  Result Date: 01/06/2021 Objective Swallowing Evaluation: Type of Study: MBS-Modified Barium Swallow Study  Patient Details Name: SHAWN CARATTINI MRN: [id number] Date of Birth: [birth year] Today's Date: 01/06/2021 Time: SLP Start Time (ACUTE ONLY): 1455 -SLP Stop Time (ACUTE ONLY): 1884 SLP Time Calculation (min) (ACUTE ONLY): 20 min Past Medical History: Past Medical History: Diagnosis Date . Allergy  . Anemia  . Anxiety and depression  . Asthma  . CVA (cerebral vascular accident) ([place])  . Hepatitis C  . History of chicken pox  . Hypertension  Past Surgical History: Past Surgical History: Procedure Laterality Date . BUBBLE STUDY  11/22/2019  Procedure: BUBBLE STUDY;  Surgeon: Dorothy Spark, MD;  Location: [facility];  Service: Cardiovascular;; . LOOP RECORDER INSERTION N/A 11/22/2019  Procedure: LOOP RECORDER INSERTION;  Surgeon: Thompson Grayer, MD;  Location: [facility];  Service: Cardiovascular;  Laterality: N/A; . TEE WITHOUT CARDIOVERSION N/A 11/22/2019  Procedure: TRANSESOPHAGEAL ECHOCARDIOGRAM (TEE);  Surgeon: Dorothy Spark, MD;  Location: [facility] ENDOSCOPY;  Service: Cardiovascular;  Laterality: N/A; HPI: Patient is a 55 y.o. male with PMH: HCV, HTN, CVA, dementia, asthma who presented to ED from SNF where he resides in memory care unit, with 2-3 days of fever, coughing, SOB. EMS reported temperature of 102 en route to hospital.  MBS from June 2021 revealed normal swallow except for  appearance of diverticulum like pouching at level of epiglottis and resulting in some fluid retention  Subjective: pleasant, cooperative Assessment / Plan / Recommendation CHL IP CLINICAL IMPRESSIONS 01/06/2021 Clinical Impression Patient presents with a mild oropharyngeal dysphagia with likely cognitive impact. Patient would intermittently hold liquids in mouth and was observed to be intently watching MBS video during testing. Mild delay in mastication and delay in oral transit with puree and regular solids bolus. Patient was not able to maintain bolus of thin liquids and barium tablet but was able to swallow whole barium tablet when in spoon of puree (applesauce). Swallow initiation delays at level of vallecular sinus observed with puree and regular solids but not with liquids. Trace amount of vallecular and pyriform sinus residuals remained after initial swallow but fully cleared with subsequent swallows. SLP did observe small outpouching which would intermittently collect trace amount of liquids, though this would fully clear and did not appear to be significantly impacting swallow. (radiologist not present to confirm;  this was observed by SLP in [facility] 08/2020. Patient did exhbiit some delayed coughing when reclined in bed after MBS and SLP suspects this is GERD related. Patient is safe to continue with Regular solids, thin liquids. SLP Visit Diagnosis Dysphagia, oropharyngeal phase (R13.12) Attention and concentration deficit following -- Frontal lobe and executive function deficit following -- Impact on safety and function Mild aspiration risk   CHL IP TREATMENT RECOMMENDATION 01/06/2021 Treatment Recommendations Therapy as outlined in treatment plan below   Prognosis 01/06/2021 Prognosis for Safe Diet Advancement Good Barriers to Reach Goals -- Barriers/Prognosis Comment -- CHL IP DIET RECOMMENDATION 01/06/2021 SLP Diet Recommendations Thin liquid;Regular solids Liquid Administration via Cup;Straw Medication  Administration Whole meds with puree Compensations Minimize environmental distractions;Small sips/bites;Slow rate Postural Changes Seated upright at 90 degrees   CHL IP OTHER RECOMMENDATIONS 01/06/2021 Recommended Consults -- Oral Care Recommendations Oral care BID Other Recommendations --   CHL IP FOLLOW UP RECOMMENDATIONS 01/06/2021 Follow up Recommendations Skilled Nursing facility;24 hour supervision/assistance   CHL IP FREQUENCY AND DURATION 01/06/2021 Speech Therapy Frequency (ACUTE ONLY) min 1 x/week Treatment Duration 1 week      CHL IP ORAL PHASE 01/06/2021 Oral Phase Impaired Oral - Pudding Teaspoon -- Oral - Pudding Cup -- Oral - Honey Teaspoon -- Oral - Honey Cup -- Oral - Nectar Teaspoon -- Oral - Nectar Cup -- Oral - Nectar Straw -- Oral - Thin Teaspoon -- Oral - Thin Cup Holding of bolus;Delayed oral transit;Other (Comment) Oral - Thin Straw Holding of bolus Oral - Puree Holding of bolus;Premature spillage;Delayed oral transit Oral - Mech Soft -- Oral - Regular Delayed oral transit;Premature spillage Oral - Multi-Consistency -- Oral - Pill Decreased bolus cohesion;Delayed oral transit Oral Phase - Comment --  CHL IP PHARYNGEAL PHASE 01/06/2021 Pharyngeal Phase Impaired Pharyngeal- Pudding Teaspoon -- Pharyngeal -- Pharyngeal- Pudding Cup -- Pharyngeal -- Pharyngeal- Honey Teaspoon -- Pharyngeal -- Pharyngeal- Honey Cup -- Pharyngeal -- Pharyngeal- Nectar Teaspoon -- Pharyngeal -- Pharyngeal- Nectar Cup -- Pharyngeal -- Pharyngeal- Nectar Straw -- Pharyngeal -- Pharyngeal- Thin Teaspoon -- Pharyngeal -- Pharyngeal- Thin Cup Pharyngeal residue - valleculae;Pharyngeal residue - pyriform Pharyngeal Material does not enter airway Pharyngeal- Thin Straw Pharyngeal residue - valleculae;Pharyngeal residue - pyriform Pharyngeal Material does not enter airway Pharyngeal- Puree Delayed swallow initiation-vallecula Pharyngeal Material does not enter airway Pharyngeal- Mechanical Soft -- Pharyngeal -- Pharyngeal-  Regular Delayed swallow initiation-vallecula Pharyngeal Material does not enter airway Pharyngeal- Multi-consistency -- Pharyngeal -- Pharyngeal- Pill WFL Pharyngeal Material does not enter airway Pharyngeal Comment Appearance of what appeared to be small outpouching/diverticulum in pharynx which would result in trace amounts of thin liquids to briefly remain before ultimately clearing with subsequent swallows.  CHL IP CERVICAL ESOPHAGEAL PHASE 01/06/2021 Cervical Esophageal Phase WFL Pudding Teaspoon -- Pudding Cup -- Honey Teaspoon -- Honey Cup -- Nectar Teaspoon -- Nectar Cup -- Nectar Straw -- Thin Teaspoon -- Thin Cup -- Thin Straw -- Puree -- Mechanical Soft -- Regular -- Multi-consistency -- Pill -- Cervical Esophageal Comment -- Sonia Baller, MA, CCC-SLP 01/06/21 5:57 PM                LOS: 1 day   Antonieta Pert, MD [facility]  01/08/2021, 11:47 AM

## 2021-01-09 ENCOUNTER — Other Ambulatory Visit: Payer: Self-pay | Admitting: Internal Medicine

## 2021-01-09 DIAGNOSIS — J189 Pneumonia, unspecified organism: Secondary | ICD-10-CM | POA: Diagnosis not present

## 2021-01-09 DIAGNOSIS — A419 Sepsis, unspecified organism: Secondary | ICD-10-CM | POA: Diagnosis not present

## 2021-01-09 MED ORDER — POLYETHYLENE GLYCOL 3350 17 G PO PACK: 17.0000 g | PACK | Freq: Every day | ORAL | 0 refills | Status: DC | PRN

## 2021-01-09 MED ORDER — CEFDINIR 300 MG PO CAPS: 300.0000 mg | ORAL_CAPSULE | Freq: Two times a day (BID) | ORAL | 0 refills | Status: AC

## 2021-01-09 MED ORDER — AZITHROMYCIN 500 MG PO TABS: 500.0000 mg | ORAL_TABLET | Freq: Every day | ORAL | Status: DC

## 2021-01-09 NOTE — TOC Transition Note (Signed)
Transition of Care [facility]) - CM/SW Discharge Note   Patient Details  Name: Darren Pearson MRN: [id number] Date of Birth: [birth year]  [facility] CM/SW Contact:  Lynnell Catalan, RN Phone Number: 01/09/2021, 11:23 AM   Clinical Narrative:    Pt to dc back to [facility] room [facility] today. RN to call report to [phone]. [facility] contacted for transport.    Final next level of care: Skilled Nursing Facility Barriers to Discharge: No Barriers Identified   Discharge Placement              Patient [facility] bed at: [facility] (LTC) Patient to be transferred to facility by: [place] Name of family member notified: Sister Patient and family notified of of transfer: 01/09/21  Discharge Plan and Services In-house Referral: Clinical Social Work         Readmission Risk Interventions Readmission Risk Prevention Plan 01/07/2021  Transportation Screening Complete  Home Care Screening Complete  Medication Review (RN CM) Complete  Some recent data might be hidden

## 2021-01-09 NOTE — Discharge Summary (Signed)
Physician Discharge Summary  JAMOND NEELS QBV:[id number] DOB: 06-18-1966 DOA: 01/06/2021  PCP: Seward Carol, MD  Admit date: 01/06/2021 Discharge date: 01/09/2021  Admitted From: SNF Disposition:  SNF  Recommendations for Outpatient Follow-up:  1. Follow up with PCP in 1-2 weeks 2. Please obtain BMP/CBC in one week  Home Health:no  Equipment/Devices: none  Discharge Condition: Stable Code Status:   Code Status: Full Code Diet recommendation:  Diet Order            Diet - low sodium heart healthy           Diet Heart Room service appropriate? Yes; Fluid consistency: Thin  Diet effective now                  Brief/Interim Summary: 55yom w/ HCV,HTN,CVA/Dementia,htn,asthma presented from nursing facility w/ fever for 2-3 days, fever up-to 102. As per report Per EMS, his temp was 102F en route.Patient has been coughing and short of breath. Currently he is unable to specify why he is in the hospital butis alert/oriented x 1 at baseline.He denies any complaints at this time. In ED- was febrile, tachycardic, tachypneic, on room air O2 sat drops to low 90s on ambulation. Notable Labs:Na 139, K 3.8, BUN 17, Cr 1.0, AST 18, ALT 18, T bili 1.3, Lactic acid 1.0, WBC 17.3, Hb 9.9, Platelets 352, COVID 19 and flu negative. Notable Imaging:CXR - RML consolidation, underlying chronic bullous emphysema. Patient receivedToradol, 2.25 L LR bolus and maintenance fluid, Ceftriaxone/Azithromycin     He did well, wbc counts almost normalized. He is with his baseline dementia- he is stable for discharge to SNF on his po antibiotics. SW arranging return to SNF.  Discharge Diagnoses:  Sepsis due to pneumonia POA/right middle lobe pneumonia: Sepsis physiology resolved.  WBC count normalizing.  Continue on ceftriaxone/azithromycin.  Blood culture no growth so far. He met sepsis criteria with fever tachycardia tachypnea and leukocytosis on admission. Sepsis resolved. He will cont po antibiotics x 5  days course.leucocytosis almost resolved Anemia likely from chronic disease. stable Hypertension: Well-controlled on home lisinopril.  Hyperlipidemia continue statin Dementia at baseline,poor historian, but alert awake communicative follows commands.  Continue delirium precaution.  Continue Zoloft History of CVAx2/TIA- has been less mobile, walks slow since his strokes.  CONT PTOT AT snf cont his asa, statins  Consults:  p to toc  Subjective: Aaox2-3 baseline dementia-no complaints,on RA.  Discharge Exam: Vitals:   01/09/21 0610 01/09/21 0619  BP: (!) 146/87 134/82  Pulse: 100 88  Resp: 20 18  Temp: 99.3 F (37.4 C) 97.9 F (36.6 C)  SpO2: 96% 97%   General: Pt is alert, awake, not in acute distress Cardiovascular: RRR, S1/S2 +, no rubs, no gallops Respiratory: CTA bilaterally, no wheezing, no rhonchi Abdominal: Soft, NT, ND, bowel sounds + Extremities: no edema, no cyanosis  Discharge Instructions  Discharge Instructions    Diet - low sodium heart healthy   Complete by: As directed    Discharge instructions   Complete by: As directed    Please call call MD or return to ER for similar or worsening recurring problem that brought you to hospital or if any fever,nausea/vomiting,abdominal pain, uncontrolled pain, chest pain,  shortness of breath or any other alarming symptoms.  Please follow-up your doctor as instructed in a week time and call the office for appointment.  Please avoid alcohol, smoking, or any other illicit substance and maintain healthy habits including taking your regular medications as prescribed.  You were  cared for by a hospitalist during your hospital stay. If you have any questions about your discharge medications or the care you received while you were in the hospital after you are discharged, you can call the unit and ask to speak with the hospitalist on call if the hospitalist that took care of you is not available.  Once you are discharged, your  primary care physician will handle any further medical issues. Please note that NO REFILLS for any discharge medications will be authorized once you are discharged, as it is imperative that you return to your primary care physician (or establish a relationship with a primary care physician if you do not have one) for your aftercare needs so that they can reassess your need for medications and monitor your lab values   Increase activity slowly   Complete by: As directed      Allergies as of 01/09/2021      Reactions   Sulfa Antibiotics Hives      Medication List    TAKE these medications   acetaminophen 500 MG tablet Commonly known as: TYLENOL Take 1,000 mg by mouth every 6 (six) hours as needed for mild pain, fever or headache.   aspirin 81 MG EC tablet Take 1 tablet (81 mg total) by mouth daily.   atorvastatin 40 MG tablet Commonly known as: LIPITOR Take 1 tablet (40 mg total) by mouth daily at 6 PM.   azithromycin 500 MG tablet Commonly known as: ZITHROMAX Take 1 tablet (500 mg total) by mouth daily.   cefdinir 300 MG capsule Commonly known as: OMNICEF Take 1 capsule (300 mg total) by mouth 2 (two) times daily for 2 days.   cyanocobalamin 1000 MCG tablet Take 1 tablet (1,000 mcg total) by mouth daily.   ferrous sulfate 325 (65 FE) MG tablet Take 325 mg by mouth daily with breakfast.   lisinopril 20 MG tablet Commonly known as: ZESTRIL Take 1 tablet (20 mg total) by mouth daily.   polyethylene glycol 17 g packet Commonly known as: MIRALAX / GLYCOLAX Take 17 g by mouth daily as needed for mild constipation.   sertraline 50 MG tablet Commonly known as: ZOLOFT Take 50 mg by mouth daily.       Allergies  Allergen Reactions  . Sulfa Antibiotics Hives    The results of significant diagnostics from this hospitalization (including imaging, microbiology, ancillary and laboratory) are listed below for reference.    Microbiology: Recent Results (from the past 240  hour(s))  Urine culture     Status: Abnormal   Collection Time: 01/06/21  4:50 AM   Specimen: In/Out Cath Urine  Result Value Ref Range Status   Specimen Description   Final    IN/OUT CATH URINE Performed at [facility], [place] [address] [place], [place] [address]    Special Requests   Final    NONE Performed at [facility], [place] [address], [place], [place] [address]    Culture MULTIPLE SPECIES PRESENT, SUGGEST RECOLLECTION (A)  Final   Report Status 01/07/2021 FINAL  Final  Blood Culture (routine x 2)     Status: None (Preliminary result)   Collection Time: 01/06/21  4:53 AM   Specimen: BLOOD  Result Value Ref Range Status   Specimen Description   Final    BLOOD BLOOD LEFT FOREARM Performed at [place] [address], [place], [place] [address]    Special Requests   Final    BOTTLES DRAWN  AEROBIC AND ANAEROBIC Blood Culture adequate volume Performed at [place] [address], [place], [place] [address]    Culture   Final    NO GROWTH 3 DAYS Performed at [facility], [place] [address] [place], [place] [address]    Report Status PENDING  Incomplete  Resp Panel by RT-PCR (Flu A&B, Covid) Nasopharyngeal Swab     Status: None   Collection Time: 01/06/21  4:53 AM   Specimen: Nasopharyngeal Swab; Nasopharyngeal(NP) swabs in vial transport medium  Result Value Ref Range Status   SARS Coronavirus 2 by RT PCR NEGATIVE NEGATIVE Final    Comment: (NOTE) SARS-CoV-2 target nucleic acids are NOT DETECTED.  The SARS-CoV-2 RNA is generally detectable in upper respiratory specimens during the acute phase of infection. The lowest concentration of SARS-CoV-2 viral copies this assay can detect is 138 copies/mL. A negative result does not preclude SARS-Cov-2 infection and should not be used as the sole basis for treatment or other patient management decisions. A negative result may occur with   improper specimen collection/handling, submission of specimen other than nasopharyngeal swab, presence of viral mutation(s) within the areas targeted by this assay, and inadequate number of viral copies(<138 copies/mL). A negative result must be combined with clinical observations, patient history, and epidemiological information. The expected result is Negative.  Fact Sheet for Patients:  EntrepreneurPulse.com.au  Fact Sheet for Healthcare Providers:  IncredibleEmployment.be  This test is no t yet approved or cleared by the [place] FDA and  has been authorized for detection and/or diagnosis of SARS-CoV-2 by FDA under an Emergency Use Authorization (EUA). This EUA will remain  in effect (meaning this test can be used) for the duration of the COVID-19 declaration under Section 564(b)(1) of the Act, 21 U.S.C.section 360bbb-3(b)(1), unless the authorization is terminated  or revoked sooner.       Influenza A by PCR NEGATIVE NEGATIVE Final   Influenza B by PCR NEGATIVE NEGATIVE Final    Comment: (NOTE) The Xpert Xpress SARS-CoV-2/FLU/RSV plus assay is intended as an aid in the diagnosis of influenza from Nasopharyngeal swab specimens and should not be used as a sole basis for treatment. Nasal washings and aspirates are unacceptable for Xpert Xpress SARS-CoV-2/FLU/RSV testing.  Fact Sheet for Patients: EntrepreneurPulse.com.au  Fact Sheet for Healthcare Providers: IncredibleEmployment.be  This test is not yet approved or cleared by the [place] FDA and has been authorized for detection and/or diagnosis of SARS-CoV-2 by FDA under an Emergency Use Authorization (EUA). This EUA will remain in effect (meaning this test can be used) for the duration of the COVID-19 declaration under Section 564(b)(1) of the Act, 21 U.S.C. section 360bbb-3(b)(1), unless the authorization is terminated  or revoked.  Performed at [facility], [place] [address], [place], [place] [address]   Blood Culture (routine x 2)     Status: None (Preliminary result)   Collection Time: 01/06/21  4:58 AM   Specimen: BLOOD  Result Value Ref Range Status   Specimen Description   Final    BLOOD LEFT WRIST Performed at [place] [address] [place], [place] [address]    Special Requests   Final    BOTTLES DRAWN AEROBIC AND ANAEROBIC Blood Culture adequate volume Performed at [place] [address] [place], [place] [address]    Culture   Final    NO GROWTH 3 DAYS Performed at [facility], [place] [address] [place], [place] [address]  Report Status PENDING  Incomplete  MRSA PCR Screening     Status: Abnormal   Collection Time: 01/06/21  4:19 PM   Specimen: Nasal Mucosa; Nasopharyngeal  Result Value Ref Range Status   MRSA by PCR POSITIVE (A) NEGATIVE Final    Comment:        The GeneXpert MRSA Assay (FDA approved for NASAL specimens only), is one component of a comprehensive MRSA colonization surveillance program. It is not intended to diagnose MRSA infection nor to guide or monitor treatment for MRSA infections. RESULT CALLED TO, READ BACK BY AND VERIFIED WITH: ROBINSON,K RN @1807  ON 01/06/21 JACKSON,K Performed at [facility], [place] [address] [place], [place] [address]     Procedures/Studies: DG Chest Port 1 View  Result Date: 01/06/2021 CLINICAL DATA:  55 year old male with possible sepsis. Fever for 2 days but reportedly negative for COVID-19. EXAM: PORTABLE CHEST 1 VIEW COMPARISON:  Portable chest 05/09/2020 and earlier. FINDINGS: Portable AP semi upright view at 0523 hours. Mildly lower lung volumes. Stable cardiac size and mediastinal contours. Chronic left chest cardiac event recorder or superficial to febrile later. Visualized tracheal air column is within normal limits. Chronic bullous  emphysema in the upper lungs. No pneumothorax. No evidence of layering pleural effusion the right costophrenic angle but there is confluent, hazy abnormal right lower lung opacity which appears to abut the minor fissure. Partial obscuration of the right heart border. Right diaphragm remains visible. Stable lungs elsewhere. No acute osseous abnormality identified. IMPRESSION: 1. Abnormal right lower lung opacity, suspected to be right middle lobe consolidation. Favor Pneumonia in this clinical setting. PA and lateral chest radiographs would be complementary when possible. No definite effusion. 2. Underlying chronic bullous Emphysema (ICD10-J43.9). Electronically Signed   By: Genevie Ann M.D.   On: 01/06/2021 05:38   DG Swallowing Func-Speech Pathology  Result Date: 01/06/2021 Objective Swallowing Evaluation: Type of Study: MBS-Modified Barium Swallow Study  Patient Details Name: HAU SANOR MRN: [id number] Date of Birth: 1966/11/11 Today's Date: 01/06/2021 Time: SLP Start Time (ACUTE ONLY): 1455 -SLP Stop Time (ACUTE ONLY): 1515 SLP Time Calculation (min) (ACUTE ONLY): 20 min Past Medical History: Past Medical History: Diagnosis Date . Allergy  . Anemia  . Anxiety and depression  . Asthma  . CVA (cerebral vascular accident) ([place])  . Hepatitis C  . History of chicken pox  . Hypertension  Past Surgical History: Past Surgical History: Procedure Laterality Date . BUBBLE STUDY  11/22/2019  Procedure: BUBBLE STUDY;  Surgeon: Dorothy Spark, MD;  Location: [facility];  Service: Cardiovascular;; . LOOP RECORDER INSERTION N/A 11/22/2019  Procedure: LOOP RECORDER INSERTION;  Surgeon: Thompson Grayer, MD;  Location: [facility] CV [facility];  Service: Cardiovascular;  Laterality: N/A; . TEE WITHOUT CARDIOVERSION N/A 11/22/2019  Procedure: TRANSESOPHAGEAL ECHOCARDIOGRAM (TEE);  Surgeon: Dorothy Spark, MD;  Location: [facility] ENDOSCOPY;  Service: Cardiovascular;  Laterality: N/A; HPI: Patient is a 55 y.o. male with PMH: HCV, HTN,  CVA, dementia, asthma who presented to ED from SNF where he resides in memory care unit, with 2-3 days of fever, coughing, SOB. EMS reported temperature of 102 en route to hospital.  MBS from June 2021 revealed normal swallow except for appearance of diverticulum like pouching at level of epiglottis and resulting in some fluid retention  Subjective: pleasant, cooperative Assessment / Plan / Recommendation CHL IP CLINICAL IMPRESSIONS 01/06/2021 Clinical Impression Patient presents with a mild oropharyngeal dysphagia with likely cognitive impact. Patient would intermittently hold liquids in mouth and was  observed to be intently watching MBS video during testing. Mild delay in mastication and delay in oral transit with puree and regular solids bolus. Patient was not able to maintain bolus of thin liquids and barium tablet but was able to swallow whole barium tablet when in spoon of puree (applesauce). Swallow initiation delays at level of vallecular sinus observed with puree and regular solids but not with liquids. Trace amount of vallecular and pyriform sinus residuals remained after initial swallow but fully cleared with subsequent swallows. SLP did observe small outpouching which would intermittently collect trace amount of liquids, though this would fully clear and did not appear to be significantly impacting swallow. (radiologist not present to confirm; this was observed by SLP in [facility] 08/[facility]. Patient did exhbiit some delayed coughing when reclined in bed after MBS and SLP suspects this is GERD related. Patient is safe to continue with Regular solids, thin liquids. SLP Visit Diagnosis Dysphagia, oropharyngeal phase (R13.12) Attention and concentration deficit following -- Frontal lobe and executive function deficit following -- Impact on safety and function Mild aspiration risk   CHL IP TREATMENT RECOMMENDATION 01/06/2021 Treatment Recommendations Therapy as outlined in treatment plan below   Prognosis 01/06/2021  Prognosis for Safe Diet Advancement Good Barriers to Reach Goals -- Barriers/Prognosis Comment -- CHL IP DIET RECOMMENDATION 01/06/2021 SLP Diet Recommendations Thin liquid;Regular solids Liquid Administration via Cup;Straw Medication Administration Whole meds with puree Compensations Minimize environmental distractions;Small sips/bites;Slow rate Postural Changes Seated upright at 90 degrees   CHL IP OTHER RECOMMENDATIONS 01/06/2021 Recommended Consults -- Oral Care Recommendations Oral care BID Other Recommendations --   CHL IP FOLLOW UP RECOMMENDATIONS 01/06/2021 Follow up Recommendations Skilled Nursing facility;24 hour supervision/assistance   CHL IP FREQUENCY AND DURATION 01/06/2021 Speech Therapy Frequency (ACUTE ONLY) min 1 x/week Treatment Duration 1 week      CHL IP ORAL PHASE 01/06/2021 Oral Phase Impaired Oral - Pudding Teaspoon -- Oral - Pudding Cup -- Oral - Honey Teaspoon -- Oral - Honey Cup -- Oral - Nectar Teaspoon -- Oral - Nectar Cup -- Oral - Nectar Straw -- Oral - Thin Teaspoon -- Oral - Thin Cup Holding of bolus;Delayed oral transit;Other (Comment) Oral - Thin Straw Holding of bolus Oral - Puree Holding of bolus;Premature spillage;Delayed oral transit Oral - Mech Soft -- Oral - Regular Delayed oral transit;Premature spillage Oral - Multi-Consistency -- Oral - Pill Decreased bolus cohesion;Delayed oral transit Oral Phase - Comment --  CHL IP PHARYNGEAL PHASE 01/06/2021 Pharyngeal Phase Impaired Pharyngeal- Pudding Teaspoon -- Pharyngeal -- Pharyngeal- Pudding Cup -- Pharyngeal -- Pharyngeal- Honey Teaspoon -- Pharyngeal -- Pharyngeal- Honey Cup -- Pharyngeal -- Pharyngeal- Nectar Teaspoon -- Pharyngeal -- Pharyngeal- Nectar Cup -- Pharyngeal -- Pharyngeal- Nectar Straw -- Pharyngeal -- Pharyngeal- Thin Teaspoon -- Pharyngeal -- Pharyngeal- Thin Cup Pharyngeal residue - valleculae;Pharyngeal residue - pyriform Pharyngeal Material does not enter airway Pharyngeal- Thin Straw Pharyngeal residue -  valleculae;Pharyngeal residue - pyriform Pharyngeal Material does not enter airway Pharyngeal- Puree Delayed swallow initiation-vallecula Pharyngeal Material does not enter airway Pharyngeal- Mechanical Soft -- Pharyngeal -- Pharyngeal- Regular Delayed swallow initiation-vallecula Pharyngeal Material does not enter airway Pharyngeal- Multi-consistency -- Pharyngeal -- Pharyngeal- Pill WFL Pharyngeal Material does not enter airway Pharyngeal Comment Appearance of what appeared to be small outpouching/diverticulum in pharynx which would result in trace amounts of thin liquids to briefly remain before ultimately clearing with subsequent swallows.  CHL IP CERVICAL ESOPHAGEAL PHASE 01/06/2021 Cervical Esophageal Phase WFL Pudding Teaspoon -- Pudding Cup -- Honey Teaspoon -- Honey  Cup -- Air cabin crew -- Nectar Cup -- Nectar Straw -- Thin Teaspoon -- Thin Cup -- Thin Straw -- Puree -- Mechanical Soft -- Regular -- Multi-consistency -- Pill -- Cervical Esophageal Comment -- Sonia Baller, MA, CCC-SLP 01/06/21 5:57 PM              CUP PACEART REMOTE DEVICE CHECK  Result Date: 12/13/2020 ILR summary report received. Battery status OK. Normal device function. No new symptom, tachy, brady, or pause episodes. No new AF episodes. Monthly summary reports and ROV/PRN. HB   Labs: BNP (last 3 results) No results for input(s): BNP in the last 8760 hours. Basic Metabolic Panel: Recent Labs  Lab 01/06/21 0450 01/07/21 0701 01/08/21 0552  NA 139 137 137  K 3.8 3.9 3.7  CL 104 100 102  CO2 25 27 25   GLUCOSE 126* 108* 106*  BUN 17 15 15   CREATININE 1.00 0.91 0.81  CALCIUM 8.4* 8.1* 8.2*   Liver Function Tests: Recent Labs  Lab 01/06/21 0450  AST 18  ALT 18  ALKPHOS 90  BILITOT 1.3*  PROT 7.7  ALBUMIN 2.5*   No results for input(s): LIPASE, AMYLASE in the last 168 hours. No results for input(s): AMMONIA in the last 168 hours. CBC: Recent Labs  Lab 01/06/21 0450 01/07/21 0701 01/08/21 0552  WBC  17.3* 13.8* 11.0*  NEUTROABS 14.4*  --   --   HGB 9.9* 8.9* 8.6*  HCT 31.0* 27.9* 26.9*  MCV 90.4 90.3 88.8  PLT 352 334 339   Cardiac Enzymes: No results for input(s): CKTOTAL, CKMB, CKMBINDEX, TROPONINI in the last 168 hours. BNP: Invalid input(s): POCBNP CBG: No results for input(s): GLUCAP in the last 168 hours. D-Dimer No results for input(s): DDIMER in the last 72 hours. Hgb A1c No results for input(s): HGBA1C in the last 72 hours. Lipid Profile No results for input(s): CHOL, HDL, LDLCALC, TRIG, CHOLHDL, LDLDIRECT in the last 72 hours. Thyroid function studies No results for input(s): TSH, T4TOTAL, T3FREE, THYROIDAB in the last 72 hours.  Invalid input(s): FREET3 Anemia work up No results for input(s): VITAMINB12, FOLATE, FERRITIN, TIBC, IRON, RETICCTPCT in the last 72 hours. Urinalysis    Component Value Date/Time   COLORURINE YELLOW 01/06/2021 0450   APPEARANCEUR CLEAR 01/06/2021 0450   LABSPEC 1.025 01/06/2021 0450   PHURINE 6.0 01/06/2021 0450   GLUCOSEU NEGATIVE 01/06/2021 0450   HGBUR MODERATE (A) 01/06/2021 0450   BILIRUBINUR NEGATIVE 01/06/2021 0450   BILIRUBINUR neg 05/30/2014 1538   KETONESUR NEGATIVE 01/06/2021 0450   PROTEINUR 30 (A) 01/06/2021 0450   UROBILINOGEN >=8.0 05/30/2014 1538   NITRITE NEGATIVE 01/06/2021 0450   LEUKOCYTESUR NEGATIVE 01/06/2021 0450   Sepsis Labs Invalid input(s): PROCALCITONIN,  WBC,  LACTICIDVEN Microbiology Recent Results (from the past 240 hour(s))  Urine culture     Status: Abnormal   Collection Time: 01/06/21  4:50 AM   Specimen: In/Out Cath Urine  Result Value Ref Range Status   Specimen Description   Final    IN/OUT CATH URINE Performed at [facility], [place] [address], [place], [place] [address]    Special Requests   Final    NONE Performed at [facility], [place] [address], [place], [place] [address]    Culture MULTIPLE SPECIES PRESENT, SUGGEST RECOLLECTION (A)  Final    Report Status 01/07/2021 FINAL  Final  Blood Culture (routine x 2)     Status: None (Preliminary result)   Collection Time: 01/06/21  4:53 AM  Specimen: BLOOD  Result Value Ref Range Status   Specimen Description   Final    BLOOD BLOOD LEFT FOREARM Performed at [facility] [address] [place], [place] [address]    Special Requests   Final    BOTTLES DRAWN AEROBIC AND ANAEROBIC Blood Culture adequate volume Performed at [facility] [address] [place], [place] [address]    Culture   Final    NO GROWTH 3 DAYS Performed at [facility], [place] [address], [place], [place] [address]    Report Status PENDING  Incomplete  Resp Panel by RT-PCR (Flu A&B, Covid) Nasopharyngeal Swab     Status: None   Collection Time: 01/06/21  4:53 AM   Specimen: Nasopharyngeal Swab; Nasopharyngeal(NP) swabs in vial transport medium  Result Value Ref Range Status   SARS Coronavirus 2 by RT PCR NEGATIVE NEGATIVE Final    Comment: (NOTE) SARS-CoV-2 target nucleic acids are NOT DETECTED.  The SARS-CoV-2 RNA is generally detectable in upper respiratory specimens during the acute phase of infection. The lowest concentration of SARS-CoV-2 viral copies this assay can detect is 138 copies/mL. A negative result does not preclude SARS-Cov-2 infection and should not be used as the sole basis for treatment or other patient management decisions. A negative result may occur with  improper specimen collection/handling, submission of specimen other than nasopharyngeal swab, presence of viral mutation(s) within the areas targeted by this assay, and inadequate number of viral copies(<138 copies/mL). A negative result must be combined with clinical observations, patient history, and epidemiological information. The expected result is Negative.  Fact Sheet for Patients:  EntrepreneurPulse.com.au  Fact Sheet for Healthcare Providers:   IncredibleEmployment.be  This test is no t yet approved or cleared by the [place] FDA and  has been authorized for detection and/or diagnosis of SARS-CoV-2 by FDA under an Emergency Use Authorization (EUA). This EUA will remain  in effect (meaning this test can be used) for the duration of the COVID-19 declaration under Section 564(b)(1) of the Act, 21 U.S.C.section 360bbb-3(b)(1), unless the authorization is terminated  or revoked sooner.       Influenza A by PCR NEGATIVE NEGATIVE Final   Influenza B by PCR NEGATIVE NEGATIVE Final    Comment: (NOTE) The Xpert Xpress SARS-CoV-2/FLU/RSV plus assay is intended as an aid in the diagnosis of influenza from Nasopharyngeal swab specimens and should not be used as a sole basis for treatment. Nasal washings and aspirates are unacceptable for Xpert Xpress SARS-CoV-2/FLU/RSV testing.  Fact Sheet for Patients: EntrepreneurPulse.com.au  Fact Sheet for Healthcare Providers: IncredibleEmployment.be  This test is not yet approved or cleared by the [place] FDA and has been authorized for detection and/or diagnosis of SARS-CoV-2 by FDA under an Emergency Use Authorization (EUA). This EUA will remain in effect (meaning this test can be used) for the duration of the COVID-19 declaration under Section 564(b)(1) of the Act, 21 U.S.C. section 360bbb-3(b)(1), unless the authorization is terminated or revoked.  Performed at [facility], [place] [address], [place], [place]  [address]   Blood Culture (routine x 2)     Status: None (Preliminary result)   Collection Time: 01/06/21  4:58 AM   Specimen: BLOOD  Result Value Ref Range Status   Specimen Description   Final    BLOOD LEFT WRIST Performed at [facility] [address] [place], [place] [address]    Special Requests   Final    BOTTLES DRAWN  AEROBIC AND ANAEROBIC Blood Culture adequate  volume Performed at [facility] [address], [place], [place] [address]    Culture   Final    NO GROWTH 3 DAYS Performed at [facility], [place] [address], [place], [place] [address]    Report Status PENDING  Incomplete  MRSA PCR Screening     Status: Abnormal   Collection Time: 01/06/21  4:19 PM   Specimen: Nasal Mucosa; Nasopharyngeal  Result Value Ref Range Status   MRSA by PCR POSITIVE (A) NEGATIVE Final    Comment:        The GeneXpert MRSA Assay (FDA approved for NASAL specimens only), is one component of a comprehensive MRSA colonization surveillance program. It is not intended to diagnose MRSA infection nor to guide or monitor treatment for MRSA infections. RESULT CALLED TO, READ BACK BY AND VERIFIED WITH: ROBINSON,K RN @1807  ON 01/06/21 JACKSON,K Performed at [facility], [place] [address] [place], [place] [address]      Time coordinating discharge: 35 minutes  SIGNED: Antonieta Pert, MD  [facility] 01/09/2021, 9:41 AM  If 7PM-7AM, please contact night-coverage [email or web address]

## 2021-01-11 LAB — CULTURE, BLOOD (ROUTINE X 2)
Culture: NO GROWTH
Culture: NO GROWTH
Special Requests: ADEQUATE
Special Requests: ADEQUATE

## 2021-01-14 ENCOUNTER — Ambulatory Visit (INDEPENDENT_AMBULATORY_CARE_PROVIDER_SITE_OTHER): Payer: Self-pay

## 2021-01-14 DIAGNOSIS — I6340 Cerebral infarction due to embolism of unspecified cerebral artery: Secondary | ICD-10-CM

## 2021-01-16 LAB — CUP PACEART REMOTE DEVICE CHECK
Date Time Interrogation Session: 20220219230548
Implantable Pulse Generator Implant Date: 20201229

## 2021-01-22 NOTE — Progress Notes (Signed)
Carelink Summary Report / Loop Recorder 

## 2021-02-17 LAB — CUP PACEART REMOTE DEVICE CHECK
Date Time Interrogation Session: 20220324230611
Implantable Pulse Generator Implant Date: 20201229

## 2021-02-18 ENCOUNTER — Ambulatory Visit (INDEPENDENT_AMBULATORY_CARE_PROVIDER_SITE_OTHER): Payer: Medicaid Other

## 2021-02-18 DIAGNOSIS — I6340 Cerebral infarction due to embolism of unspecified cerebral artery: Secondary | ICD-10-CM | POA: Diagnosis not present

## 2021-03-04 NOTE — Progress Notes (Signed)
Carelink Summary Report / Loop Recorder 

## 2021-03-18 DIAGNOSIS — Z0271 Encounter for disability determination: Secondary | ICD-10-CM

## 2021-03-25 ENCOUNTER — Ambulatory Visit (INDEPENDENT_AMBULATORY_CARE_PROVIDER_SITE_OTHER): Payer: Medicaid Other

## 2021-03-25 DIAGNOSIS — I6340 Cerebral infarction due to embolism of unspecified cerebral artery: Secondary | ICD-10-CM | POA: Diagnosis not present

## 2021-03-25 LAB — CUP PACEART REMOTE DEVICE CHECK
Date Time Interrogation Session: 20220426230406
Implantable Pulse Generator Implant Date: 20201229

## 2021-04-16 NOTE — Progress Notes (Signed)
Carelink Summary Report / Loop Recorder 

## 2021-04-22 LAB — CUP PACEART REMOTE DEVICE CHECK
Date Time Interrogation Session: 20220529230757
Implantable Pulse Generator Implant Date: 20201229

## 2021-04-23 ENCOUNTER — Ambulatory Visit (INDEPENDENT_AMBULATORY_CARE_PROVIDER_SITE_OTHER): Payer: Self-pay

## 2021-04-23 DIAGNOSIS — I6340 Cerebral infarction due to embolism of unspecified cerebral artery: Secondary | ICD-10-CM

## 2021-05-16 NOTE — Progress Notes (Signed)
Carelink Summary Report / Loop Recorder 

## 2021-05-20 NOTE — Addendum Note (Signed)
Addended by: Douglass Rivers D on: 05/20/2021 04:17 PM   Modules accepted: Level of Service

## 2021-05-28 ENCOUNTER — Ambulatory Visit (INDEPENDENT_AMBULATORY_CARE_PROVIDER_SITE_OTHER): Payer: Medicaid Other

## 2021-05-28 DIAGNOSIS — I6340 Cerebral infarction due to embolism of unspecified cerebral artery: Secondary | ICD-10-CM

## 2021-05-30 LAB — CUP PACEART REMOTE DEVICE CHECK
Date Time Interrogation Session: 20220701230609
Implantable Pulse Generator Implant Date: 20201229

## 2021-06-18 NOTE — Progress Notes (Signed)
Carelink Summary Report / Loop Recorder 

## 2021-06-27 LAB — CUP PACEART REMOTE DEVICE CHECK
Date Time Interrogation Session: 20220803230111
Date Time Interrogation Session: 20220803230111
Implantable Pulse Generator Implant Date: 20201229
Implantable Pulse Generator Implant Date: 20201229

## 2021-07-01 ENCOUNTER — Ambulatory Visit (INDEPENDENT_AMBULATORY_CARE_PROVIDER_SITE_OTHER): Payer: Medicaid Other

## 2021-07-01 DIAGNOSIS — I6340 Cerebral infarction due to embolism of unspecified cerebral artery: Secondary | ICD-10-CM | POA: Diagnosis not present

## 2021-07-25 NOTE — Progress Notes (Signed)
Carelink Summary Report / Loop Recorder 

## 2021-08-14 ENCOUNTER — Encounter (HOSPITAL_COMMUNITY): Payer: Self-pay | Admitting: Family Medicine

## 2021-08-14 ENCOUNTER — Other Ambulatory Visit: Payer: Self-pay

## 2021-08-14 ENCOUNTER — Emergency Department (HOSPITAL_COMMUNITY): Payer: Medicaid Other

## 2021-08-14 ENCOUNTER — Inpatient Hospital Stay (HOSPITAL_COMMUNITY)
Admission: EM | Admit: 2021-08-14 | Discharge: 2021-08-19 | DRG: 871 | Disposition: A | Discharge status: Skilled Nursing Facility | Payer: Medicaid Other | Attending: Internal Medicine | Admitting: Internal Medicine

## 2021-08-14 DIAGNOSIS — R042 Hemoptysis: Secondary | ICD-10-CM | POA: Diagnosis present

## 2021-08-14 DIAGNOSIS — J45909 Unspecified asthma, uncomplicated: Secondary | ICD-10-CM | POA: Diagnosis present

## 2021-08-14 DIAGNOSIS — Z87891 Personal history of nicotine dependence: Secondary | ICD-10-CM

## 2021-08-14 DIAGNOSIS — K921 Melena: Secondary | ICD-10-CM | POA: Diagnosis present

## 2021-08-14 DIAGNOSIS — Z800 Family history of malignant neoplasm of digestive organs: Secondary | ICD-10-CM

## 2021-08-14 DIAGNOSIS — Z882 Allergy status to sulfonamides status: Secondary | ICD-10-CM

## 2021-08-14 DIAGNOSIS — E876 Hypokalemia: Secondary | ICD-10-CM | POA: Diagnosis not present

## 2021-08-14 DIAGNOSIS — I10 Essential (primary) hypertension: Secondary | ICD-10-CM

## 2021-08-14 DIAGNOSIS — N182 Chronic kidney disease, stage 2 (mild): Secondary | ICD-10-CM | POA: Diagnosis present

## 2021-08-14 DIAGNOSIS — J189 Pneumonia, unspecified organism: Secondary | ICD-10-CM | POA: Diagnosis present

## 2021-08-14 DIAGNOSIS — I129 Hypertensive chronic kidney disease with stage 1 through stage 4 chronic kidney disease, or unspecified chronic kidney disease: Secondary | ICD-10-CM | POA: Diagnosis present

## 2021-08-14 DIAGNOSIS — F0390 Unspecified dementia without behavioral disturbance: Secondary | ICD-10-CM

## 2021-08-14 DIAGNOSIS — F419 Anxiety disorder, unspecified: Secondary | ICD-10-CM | POA: Diagnosis present

## 2021-08-14 DIAGNOSIS — Z8673 Personal history of transient ischemic attack (TIA), and cerebral infarction without residual deficits: Secondary | ICD-10-CM

## 2021-08-14 DIAGNOSIS — R040 Epistaxis: Secondary | ICD-10-CM | POA: Diagnosis present

## 2021-08-14 DIAGNOSIS — A419 Sepsis, unspecified organism: Principal | ICD-10-CM | POA: Diagnosis present

## 2021-08-14 DIAGNOSIS — Z7982 Long term (current) use of aspirin: Secondary | ICD-10-CM

## 2021-08-14 DIAGNOSIS — E785 Hyperlipidemia, unspecified: Secondary | ICD-10-CM

## 2021-08-14 DIAGNOSIS — Z833 Family history of diabetes mellitus: Secondary | ICD-10-CM

## 2021-08-14 DIAGNOSIS — J690 Pneumonitis due to inhalation of food and vomit: Secondary | ICD-10-CM | POA: Diagnosis not present

## 2021-08-14 DIAGNOSIS — F32A Depression, unspecified: Secondary | ICD-10-CM | POA: Diagnosis present

## 2021-08-14 DIAGNOSIS — D631 Anemia in chronic kidney disease: Secondary | ICD-10-CM | POA: Diagnosis present

## 2021-08-14 DIAGNOSIS — R1310 Dysphagia, unspecified: Secondary | ICD-10-CM | POA: Diagnosis present

## 2021-08-14 DIAGNOSIS — Z79899 Other long term (current) drug therapy: Secondary | ICD-10-CM

## 2021-08-14 DIAGNOSIS — Z8701 Personal history of pneumonia (recurrent): Secondary | ICD-10-CM

## 2021-08-14 DIAGNOSIS — I69391 Dysphagia following cerebral infarction: Secondary | ICD-10-CM

## 2021-08-14 DIAGNOSIS — Z8619 Personal history of other infectious and parasitic diseases: Secondary | ICD-10-CM

## 2021-08-14 DIAGNOSIS — I69354 Hemiplegia and hemiparesis following cerebral infarction affecting left non-dominant side: Secondary | ICD-10-CM

## 2021-08-14 DIAGNOSIS — Z20822 Contact with and (suspected) exposure to covid-19: Secondary | ICD-10-CM | POA: Diagnosis present

## 2021-08-14 LAB — CBC WITH DIFFERENTIAL/PLATELET
Abs Immature Granulocytes: 0.02 10*3/uL (ref 0.00–0.07)
Basophils Absolute: 0 10*3/uL (ref 0.0–0.1)
Basophils Relative: 0 %
Eosinophils Absolute: 0.1 10*3/uL (ref 0.0–0.5)
Eosinophils Relative: 2 %
HCT: 36.6 % — ABNORMAL LOW (ref 39.0–52.0)
Hemoglobin: 11.9 g/dL — ABNORMAL LOW (ref 13.0–17.0)
Immature Granulocytes: 0 %
Lymphocytes Relative: 29 %
Lymphs Abs: 2.2 10*3/uL (ref 0.7–4.0)
MCH: 29 pg (ref 26.0–34.0)
MCHC: 32.5 g/dL (ref 30.0–36.0)
MCV: 89.1 fL (ref 80.0–100.0)
Monocytes Absolute: 0.6 10*3/uL (ref 0.1–1.0)
Monocytes Relative: 8 %
Neutro Abs: 4.6 10*3/uL (ref 1.7–7.7)
Neutrophils Relative %: 61 %
Platelets: 249 10*3/uL (ref 150–400)
RBC: 4.11 MIL/uL — ABNORMAL LOW (ref 4.22–5.81)
RDW: 13.7 % (ref 11.5–15.5)
WBC: 7.5 10*3/uL (ref 4.0–10.5)
nRBC: 0 % (ref 0.0–0.2)

## 2021-08-14 LAB — COMPREHENSIVE METABOLIC PANEL
ALT: 10 U/L (ref 0–44)
AST: 14 U/L — ABNORMAL LOW (ref 15–41)
Albumin: 3.6 g/dL (ref 3.5–5.0)
Alkaline Phosphatase: 96 U/L (ref 38–126)
Anion gap: 10 (ref 5–15)
BUN: 19 mg/dL (ref 6–20)
CO2: 25 mmol/L (ref 22–32)
Calcium: 8.9 mg/dL (ref 8.9–10.3)
Chloride: 103 mmol/L (ref 98–111)
Creatinine, Ser: 0.93 mg/dL (ref 0.61–1.24)
GFR, Estimated: >60 mL/min (ref >60)
Glucose, Bld: 88 mg/dL (ref 70–99)
Potassium: 3.5 mmol/L (ref 3.5–5.1)
Sodium: 138 mmol/L (ref 135–145)
Total Bilirubin: 1.3 mg/dL — ABNORMAL HIGH (ref 0.3–1.2)
Total Protein: 8.4 g/dL — ABNORMAL HIGH (ref 6.5–8.1)

## 2021-08-14 LAB — PROTIME-INR
INR: 1.1 (ref 0.8–1.2)
Prothrombin Time: 14.6 seconds (ref 11.4–15.2)

## 2021-08-14 LAB — TROPONIN I (HIGH SENSITIVITY)
Troponin I (High Sensitivity): 14 ng/L (ref <18)
Troponin I (High Sensitivity): 15 ng/L (ref <18)

## 2021-08-14 MED ORDER — SODIUM CHLORIDE 0.9 % IV SOLN
500.0000 mg | Freq: Once | INTRAVENOUS | Status: AC
  Administered 2021-08-14: 500 mg via INTRAVENOUS
  Filled 2021-08-14: qty 500

## 2021-08-14 MED ORDER — ENOXAPARIN SODIUM 40 MG/0.4ML IJ SOSY
40.0000 mg | PREFILLED_SYRINGE | INTRAMUSCULAR | Status: DC
  Administered 2021-08-14: 40 mg via SUBCUTANEOUS
  Filled 2021-08-14: qty 0.4

## 2021-08-14 MED ORDER — SODIUM CHLORIDE 0.9 % IV SOLN
1.0000 g | Freq: Once | INTRAVENOUS | Status: AC
  Administered 2021-08-14: 1 g via INTRAVENOUS
  Filled 2021-08-14: qty 10

## 2021-08-14 MED ORDER — ATORVASTATIN CALCIUM 40 MG PO TABS
40.0000 mg | ORAL_TABLET | Freq: Every day | ORAL | Status: DC
  Administered 2021-08-15 – 2021-08-18 (×4): 40 mg via ORAL
  Filled 2021-08-14 (×4): qty 1

## 2021-08-14 MED ORDER — AZITHROMYCIN 250 MG PO TABS
500.0000 mg | ORAL_TABLET | Freq: Every day | ORAL | Status: DC
  Filled 2021-08-14: qty 2

## 2021-08-14 MED ORDER — VITAMIN B-12 1000 MCG PO TABS
1000.0000 ug | ORAL_TABLET | Freq: Every day | ORAL | Status: DC
  Administered 2021-08-15 – 2021-08-19 (×5): 1000 ug via ORAL
  Filled 2021-08-14 (×5): qty 1

## 2021-08-14 MED ORDER — LISINOPRIL 20 MG PO TABS
20.0000 mg | ORAL_TABLET | Freq: Every day | ORAL | Status: DC
  Administered 2021-08-15: 20 mg via ORAL
  Filled 2021-08-14: qty 1

## 2021-08-14 MED ORDER — FERROUS SULFATE 325 (65 FE) MG PO TABS
325.0000 mg | ORAL_TABLET | Freq: Every day | ORAL | Status: DC
  Administered 2021-08-15 – 2021-08-19 (×5): 325 mg via ORAL
  Filled 2021-08-14 (×5): qty 1

## 2021-08-14 MED ORDER — IOHEXOL 350 MG/ML SOLN
100.0000 mL | Freq: Once | INTRAVENOUS | Status: AC | PRN
  Administered 2021-08-14: 100 mL via INTRAVENOUS

## 2021-08-14 MED ORDER — SERTRALINE HCL 50 MG PO TABS
50.0000 mg | ORAL_TABLET | Freq: Every day | ORAL | Status: DC
  Administered 2021-08-15 – 2021-08-19 (×5): 50 mg via ORAL
  Filled 2021-08-14 (×5): qty 1

## 2021-08-14 MED ORDER — CEFTRIAXONE SODIUM 2 G IJ SOLR: 2.0000 g | INTRAMUSCULAR | Status: DC

## 2021-08-14 MED ORDER — LABETALOL HCL 5 MG/ML IV SOLN
5.0000 mg | INTRAVENOUS | Status: DC | PRN
  Administered 2021-08-14 – 2021-08-15 (×2): 5 mg via INTRAVENOUS
  Filled 2021-08-14 (×3): qty 4

## 2021-08-14 MED ORDER — ASPIRIN EC 81 MG PO TBEC
81.0000 mg | DELAYED_RELEASE_TABLET | Freq: Every day | ORAL | Status: DC
  Administered 2021-08-15: 81 mg via ORAL
  Filled 2021-08-14: qty 1

## 2021-08-14 MED ORDER — SODIUM CHLORIDE 0.9 % IV BOLUS
500.0000 mL | Freq: Once | INTRAVENOUS | Status: AC
  Administered 2021-08-14: 500 mL via INTRAVENOUS

## 2021-08-14 NOTE — H&P (Signed)
History and Physical    Darren Pearson RUE:[id number] DOB: Oct 05, 1966 DOA: 08/14/2021  PCP: Thompson Grayer, MD  Patient coming from: SNF  I have personally briefly reviewed patient's old medical records in [place]  Chief Complaint: Hemoptysis  HPI: Darren Pearson is a 55 y.o. male with medical history significant for history of dementia, CVA, hypertension, hepatitis C, asthma, CKD stage II who presents with concerns of hemoptysis.  Patient overall a poor historian due to dementia.  He is alert and oriented to self and place only.  Reports today he started coughing and eventually had hemoptysis.  Unable to quantify how much.  Sister at bedside said she saw an episode of coughing where he had blood clots.  He reports some shortness of breath with exertion.  Denies any nausea, vomiting or diarrhea.  Denies having trouble swallowing. Last admitted for community-acquired pneumonia back in February.  ED Course: He was afebrile with temperature of 99.48F, hypertensive up to systolic 147W, tachypneic with respiratory rate 24 but stable on room air.  Heart rate of 90s. No leukocytosis.  Hemoglobin appears hemoconcentrated at 11.  9 compared to prior.  Sodium 138, potassium 3.5, creatinine of 0.93  CT of the chest showed no central pulmonary embolism but otherwise had motion degradation.  There is right middle lobe and lower lobe pneumonia concerning for possible aspiration.  Patient started on Rocephin and azithromycin and hospitalist was called for admission.  Review of Systems: Pertinent positives and negatives as stated above.  Unable to obtain for ROS given patient's dementia  Social history Patient was living independently until July of last year and had to move to SNF due to dementia and incontinent. Former tobacco use, no current alcohol illicit drug use.  Past Medical History:  Diagnosis Date   Allergy    Anemia    Anxiety and depression    Asthma    CVA (cerebral  vascular accident) ([place])    Hepatitis C    History of chicken pox    Hypertension     Past Surgical History:  Procedure Laterality Date   BUBBLE STUDY  11/22/2019   Procedure: BUBBLE STUDY;  Surgeon: Dorothy Spark, MD;  Location: [facility];  Service: Cardiovascular;;   LOOP RECORDER INSERTION N/A 11/22/2019   Procedure: LOOP RECORDER INSERTION;  Surgeon: Thompson Grayer, MD;  Location: [facility];  Service: Cardiovascular;  Laterality: N/A;   TEE WITHOUT CARDIOVERSION N/A 11/22/2019   Procedure: TRANSESOPHAGEAL ECHOCARDIOGRAM (TEE);  Surgeon: Dorothy Spark, MD;  Location: [facility] ENDOSCOPY;  Service: Cardiovascular;  Laterality: N/A;     reports that he has quit smoking. His smoking use included cigarettes. He started smoking about 8 years ago. He smoked an average of .25 packs per day. He has never used smokeless tobacco. He reports that he does not currently use alcohol after a past usage of about 2.0 standard drinks per week. He reports that he does not currently use drugs after having used the following drugs: Marijuana. Frequency: 5.00 times per week. Social History  Allergies  Allergen Reactions   Sulfa Antibiotics Hives    Family History  Problem Relation Age of Onset   Stomach cancer Mother 42   Diabetes Father    Paget's disease of bone Father    Colon cancer Neg Hx    Esophageal cancer Neg Hx    Pancreatic cancer Neg Hx    Liver disease Neg Hx      Prior to Admission medications  Medication Sig Start Date End Date Taking? Authorizing Provider  aspirin 81 MG EC tablet Take 1 tablet (81 mg total) by mouth daily. 11/24/19  Yes Kathie Dike, MD  atorvastatin (LIPITOR) 40 MG tablet Take 1 tablet (40 mg total) by mouth daily at 6 PM. 11/23/19  Yes Memon, Jolaine Artist, MD  cyanocobalamin 1000 MCG tablet Take 1 tablet (1,000 mcg total) by mouth daily. 11/24/19  Yes Kathie Dike, MD  ferrous sulfate 325 (65 FE) MG tablet Take 325 mg by mouth daily with  breakfast.   Yes [provider]  lisinopril (ZESTRIL) 20 MG tablet Take 1 tablet (20 mg total) by mouth daily. 05/24/20 08/14/21 Yes Charlynne Cousins, MD  sertraline (ZOLOFT) 50 MG tablet Take 50 mg by mouth daily.   Yes [provider]  azithromycin (ZITHROMAX) 500 MG tablet Take 1 tablet (500 mg total) by mouth daily. Patient not taking: No sig reported 01/09/21   Antonieta Pert, MD  polyethylene glycol (MIRALAX / GLYCOLAX) 17 g packet Take 17 g by mouth daily as needed for mild constipation. Patient not taking: No sig reported 01/09/21   Antonieta Pert, MD  polyethylene glycol powder (GLYCOLAX/MIRALAX) 17 GM/SCOOP powder TAKE 17 G BY MOUTH DAILY AS NEEDED FOR MILD CONSTIPATION. Patient taking differently: Take 17 g by mouth daily as needed for moderate constipation or mild constipation. 01/09/21 01/09/22  Antonieta Pert, MD    Physical Exam: Vitals:   08/14/21 1630 08/14/21 1742 08/14/21 1900 08/14/21 1954  BP: (!) 159/100 (!) 156/102 (!) 181/117 (!) 189/109  Pulse: 83 87 93 98  Resp: 20 (!) 25 (!) 22 (!) 24  Temp:      SpO2: 95% 95% 96% 100%  Weight:      Height:        Constitutional: NAD, calm, comfortable, nontoxic appearing elderly gentleman laying at approximately 20 degree incline in bed Vitals:   08/14/21 1630 08/14/21 1742 08/14/21 1900 08/14/21 1954  BP: (!) 159/100 (!) 156/102 (!) 181/117 (!) 189/109  Pulse: 83 87 93 98  Resp: 20 (!) 25 (!) 22 (!) 24  Temp:      SpO2: 95% 95% 96% 100%  Weight:      Height:       Eyes: PERRL, lids and conjunctivae normal ENMT: Mucous membranes are moist. Respiratory: clear to auscultation bilaterally, no wheezing, no crackles. Normal respiratory effort. No accessory muscle use.  Cardiovascular: Regular rate and rhythm, no murmurs / rubs / gallops. No extremity edema.  Abdomen: no tenderness, no masses palpated.  Bowel sounds positive.  Musculoskeletal: no clubbing / cyanosis. No joint deformity upper and lower extremities.  no  contractures. Normal muscle tone.  Skin: no rashes, lesions, ulcers. No induration Neurologic: CN 2-12 grossly intact. Sensation intact, Strength 5/5 in all 4 but had difficulty sitting up without assistance Psychiatric: Alert and oriented only to self and place.  Flat affect.   Labs on Admission: I have personally reviewed following labs and imaging studies  CBC: Recent Labs  Lab 08/14/21 1642  WBC 7.5  NEUTROABS 4.6  HGB 11.9*  HCT 36.6*  MCV 89.1  PLT 588   Basic Metabolic Panel: Recent Labs  Lab 08/14/21 1642  NA 138  K 3.5  CL 103  CO2 25  GLUCOSE 88  BUN 19  CREATININE 0.93  CALCIUM 8.9   GFR: Estimated Creatinine Clearance: 88.7 mL/min (by C-G formula based on SCr of 0.93 mg/dL). Liver Function Tests: Recent Labs  Lab 08/14/21 1642  AST 14*  ALT 10  ALKPHOS 96  BILITOT 1.3*  PROT 8.4*  ALBUMIN 3.6   No results for input(s): LIPASE, AMYLASE in the last 168 hours. No results for input(s): AMMONIA in the last 168 hours. Coagulation Profile: Recent Labs  Lab 08/14/21 1642  INR 1.1   Cardiac Enzymes: No results for input(s): CKTOTAL, CKMB, CKMBINDEX, TROPONINI in the last 168 hours. BNP (last 3 results) No results for input(s): PROBNP in the last 8760 hours. HbA1C: No results for input(s): HGBA1C in the last 72 hours. CBG: No results for input(s): GLUCAP in the last 168 hours. Lipid Profile: No results for input(s): CHOL, HDL, LDLCALC, TRIG, CHOLHDL, LDLDIRECT in the last 72 hours. Thyroid Function Tests: No results for input(s): TSH, T4TOTAL, FREET4, T3FREE, THYROIDAB in the last 72 hours. Anemia Panel: No results for input(s): VITAMINB12, FOLATE, FERRITIN, TIBC, IRON, RETICCTPCT in the last 72 hours. Urine analysis:    Component Value Date/Time   COLORURINE YELLOW 01/06/2021 0450   APPEARANCEUR CLEAR 01/06/2021 0450   LABSPEC 1.025 01/06/2021 0450   PHURINE 6.0 01/06/2021 0450   GLUCOSEU NEGATIVE 01/06/2021 0450   HGBUR MODERATE (A)  01/06/2021 0450   BILIRUBINUR NEGATIVE 01/06/2021 0450   BILIRUBINUR neg 05/30/2014 1538   KETONESUR NEGATIVE 01/06/2021 0450   PROTEINUR 30 (A) 01/06/2021 0450   UROBILINOGEN >=8.0 05/30/2014 1538   NITRITE NEGATIVE 01/06/2021 0450   LEUKOCYTESUR NEGATIVE 01/06/2021 0450    Radiological Exams on Admission: CT Angio Chest PE W and/or Wo Contrast  Result Date: 08/14/2021 CLINICAL DATA:  Hemoptysis. EXAM: CT ANGIOGRAPHY CHEST WITH CONTRAST TECHNIQUE: Multidetector CT imaging of the chest was performed using the standard protocol during bolus administration of intravenous contrast. Multiplanar CT image reconstructions and MIPs were obtained to evaluate the vascular anatomy. CONTRAST:  168mL OMNIPAQUE IOHEXOL 350 MG/ML SOLN COMPARISON:  Chest radiograph dated 01/08/2021. FINDINGS: Cardiovascular: No cardiomegaly or pericardial effusion. The thoracic aorta is unremarkable. Evaluation of the pulmonary arteries is very limited due to respiratory motion artifact and suboptimal opacification and timing of the contrast. No large or central pulmonary artery embolus identified. Mediastinum/Nodes: Right hilar adenopathy measures 2.5 x 2.4 cm. The esophagus is grossly unremarkable. No mediastinal fluid collection. Lungs/Pleura: Background of centrilobular emphysema with large biapical subpleural blebs. There is consolidative changes of the right middle lobe with volume loss and areas of small cystic changes. Although findings may represent atelectasis or pneumonia underlying mass is not excluded. Follow-up to resolution is recommended. There is patchy area of nodular density in the right lower lobe consistent with pneumonia. Aspiration is not excluded. Mucus secretions noted in the trachea and mainstem bronchi. The central airways remain patent. No pleural effusion or pneumothorax. Upper Abdomen: No acute abnormality. Musculoskeletal: No chest wall abnormality. No acute or significant osseous findings. Review of the  MIP images confirms the above findings. IMPRESSION: 1. No CT evidence of central pulmonary artery embolus. 2. Right middle lobe atelectasis or pneumonia. Underlying mass is not excluded. Follow-up to resolution is recommended. 3. Patchy area of nodular density in the right lower lobe consistent with pneumonia. Aspiration is not excluded. 4. Right hilar adenopathy, likely reactive. 5. Emphysema (ICD10-J43.9). Electronically Signed   By: Anner Crete M.D.   On: 08/14/2021 19:59      Assessment/Plan  Sepsis secondary to pneumonia/right middle and lower lobe -Patient presented with tachycardia, tachypnea with pneumonia seen on CT chest concerning for aspiration.  Recommend repeat imaging following resolution of symptoms given underlying mass not excluded on CT. -Given that he has  no empyema, will continue Rocephin and azithromycin coverage - Obtain Legionella, strep pneumo urine antigen and sputum culture -Obtain speech study given recurrent pneumonia.  Last admission in February 2022  Hypertension - Uncontrolled -Continue lisinopril -Add as needed IV labetalol 5 mg  Hyperlipidemia - Continue statin  History of CVA - Continue aspirin - Patient ambulates with walker but moves slowly  Dementia - Patient oriented to self and place only but is communicative and follows command  DVT prophylaxis:.Lovenox Code Status: Full Family Communication: Plan discussed with patient AND sister at bedside  disposition Plan: Home with observation Consults called:  Admission status: Observation  Level of care: Med-Surg  Status is: Observation  The patient remains OBS appropriate and will d/c before 2 midnights.  Dispo: The patient is from: SNF              Anticipated d/c is to: SNF              Patient currently is not medically stable to d/c.   Difficult to place patient No         Orene Desanctis DO [facility]   If 7PM-7AM, please contact  night-coverage [email or web address]   08/14/2021, 9:08 PM

## 2021-08-14 NOTE — ED Triage Notes (Addendum)
Per EMS-states he started coughing up blood earlier today-no respiratory history, no blood thinner-lung sounds "wet"-patient has early dementia-no chest trauma-patient is from [facility]-possible aspiration-BP 200/120-SpO2 96% on RA-patient takes Lisinopril

## 2021-08-14 NOTE — Plan of Care (Signed)

## 2021-08-14 NOTE — ED Provider Notes (Signed)
[facility] DEPT Provider Note   CSN: [id number] Arrival date & time: 08/14/21  1515     History Chief Complaint  Patient presents with   Hemoptysis    Darren Pearson is a 55 y.o. male with PMH hepatitis C, HTN, previous CVA, asthma, dementia who presents the emergency department for evaluation of hemoptysis.  he states that symptoms began today, and that he is coughed up approximately half a cupful of blood.  He endorses coughing up clots.  Denies chest pain, abdominal pain, nausea, vomiting, headache, fever or other systemic symptoms.  Denies chest trauma or blood thinner use.  HPI     Past Medical History:  Diagnosis Date   Allergy    Anemia    Anxiety and depression    Asthma    CVA (cerebral vascular accident) ([place])    Hepatitis C    History of chicken pox    Hypertension     Patient Active Problem List   Diagnosis Date Noted   Pneumonia 01/07/2021   Sepsis due to pneumonia ([place]) 01/06/2021   AKI (acute kidney injury) ([place]) 05/16/2020   CKD (chronic kidney disease), stage II 05/16/2020   Episode of unresponsiveness 05/09/2020   Symptomatic anemia 05/09/2020   History of CVA (cerebrovascular accident) 05/09/2020   Elevated troponin 05/09/2020   CVA (cerebral vascular accident) ([place]) 11/20/2019   Hypokalemia 11/20/2019   Accelerated hypertension 11/20/2019   Hyperammonemia ([place]) 11/20/2019   Elevated serum creatinine 11/20/2019   Elevated blood pressure 03/15/2015   Rash and nonspecific skin eruption 03/15/2015   Hepatitis C 04/12/2013   Allergic rhinitis 04/12/2013   Unspecified asthma(493.90) 04/12/2013    Past Surgical History:  Procedure Laterality Date   BUBBLE STUDY  11/22/2019   Procedure: BUBBLE STUDY;  Surgeon: Dorothy Spark, MD;  Location: [facility];  Service: Cardiovascular;;   LOOP RECORDER INSERTION N/A 11/22/2019   Procedure: LOOP RECORDER INSERTION;  Surgeon: Thompson Grayer, MD;  Location: [facility]  CV LAB;  Service: Cardiovascular;  Laterality: N/A;   TEE WITHOUT CARDIOVERSION N/A 11/22/2019   Procedure: TRANSESOPHAGEAL ECHOCARDIOGRAM (TEE);  Surgeon: Dorothy Spark, MD;  [facility] ENDOSCOPY;  Service: Cardiovascular;  Laterality: N/A;       Family History  Problem Relation Age of Onset   Stomach cancer Mother 62   Diabetes Father    Paget's disease of bone Father    Colon cancer Neg Hx    Esophageal cancer Neg Hx    Pancreatic cancer Neg Hx    Liver disease Neg Hx     Social History   Tobacco Use   Smoking status: Former    Packs/day: 0.25    Types: Cigarettes    Start date: 11/24/2012   Smokeless tobacco: Never   Tobacco comments:    has not smoked since 11/20/2019  Vaping Use   Vaping Use: Former  Substance Use Topics   Alcohol use: Not Currently    Alcohol/week: 2.0 standard drinks    Types: 2 Cans of beer per week    Comment: social, states stopped on 11/20/2019.   Drug use: Not Currently    Frequency: 5.0 times per week    Types: Marijuana    Comment: Stop 11/20/2019    Home Medications Prior to Admission medications   Medication Sig Start Date End Date Taking? Authorizing Provider  aspirin 81 MG EC tablet Take 1 tablet (81 mg total) by mouth daily. 11/24/19  Yes Kathie Dike, MD  atorvastatin (LIPITOR) 40  MG tablet Take 1 tablet (40 mg total) by mouth daily at 6 PM. 11/23/19  Yes Memon, Jolaine Artist, MD  cyanocobalamin 1000 MCG tablet Take 1 tablet (1,000 mcg total) by mouth daily. 11/24/19  Yes Kathie Dike, MD  ferrous sulfate 325 (65 FE) MG tablet Take 325 mg by mouth daily with breakfast.   Yes [provider]  lisinopril (ZESTRIL) 20 MG tablet Take 1 tablet (20 mg total) by mouth daily. 05/24/20 08/14/21 Yes Charlynne Cousins, MD  sertraline (ZOLOFT) 50 MG tablet Take 50 mg by mouth daily.   Yes [provider]  azithromycin (ZITHROMAX) 500 MG tablet Take 1 tablet (500 mg total) by mouth daily. Patient not taking: No sig  reported 01/09/21   Antonieta Pert, MD  polyethylene glycol (MIRALAX / GLYCOLAX) 17 g packet Take 17 g by mouth daily as needed for mild constipation. Patient not taking: No sig reported 01/09/21   Antonieta Pert, MD  polyethylene glycol powder (GLYCOLAX/MIRALAX) 17 GM/SCOOP powder TAKE 17 G BY MOUTH DAILY AS NEEDED FOR MILD CONSTIPATION. Patient taking differently: Take 17 g by mouth daily as needed for moderate constipation or mild constipation. 01/09/21 01/09/22  Antonieta Pert, MD    Allergies    Sulfa antibiotics  Review of Systems   Review of Systems  Constitutional:  Negative for chills and fever.  HENT:  Negative for ear pain and sore throat.   Eyes:  Negative for pain and visual disturbance.  Respiratory:  Negative for cough and shortness of breath.        Hemoptysis  Cardiovascular:  Negative for chest pain and palpitations.  Gastrointestinal:  Negative for abdominal pain and vomiting.  Genitourinary:  Negative for dysuria and hematuria.  Musculoskeletal:  Negative for arthralgias and back pain.  Skin:  Negative for color change and rash.  Neurological:  Negative for seizures and syncope.  All other systems reviewed and are negative.  Physical Exam Updated Vital Signs BP (!) 189/109   Pulse 98   Temp 99.1 F (37.3 C)   Resp (!) 24   Ht 5\' 9"  (1.753 m)   Wt 72.6 kg   SpO2 100%   BMI 23.63 kg/m   Physical Exam Vitals and nursing note reviewed.  Constitutional:      Appearance: He is well-developed.  HENT:     Head: Normocephalic and atraumatic.  Eyes:     Conjunctiva/sclera: Conjunctivae normal.  Cardiovascular:     Rate and Rhythm: Normal rate and regular rhythm.     Heart sounds: No murmur heard. Pulmonary:     Effort: Pulmonary effort is normal. No respiratory distress.     Breath sounds: Normal breath sounds.  Abdominal:     Palpations: Abdomen is soft.     Tenderness: There is no abdominal tenderness.  Musculoskeletal:     Cervical back: Neck supple.  Skin:     General: Skin is warm and dry.  Neurological:     Mental Status: He is alert.    ED Results / Procedures / Treatments   Labs (all labs ordered are listed, but only abnormal results are displayed) Labs Reviewed  CBC WITH DIFFERENTIAL/PLATELET - Abnormal; Notable for the following components:      Result Value   RBC 4.11 (*)    Hemoglobin 11.9 (*)    HCT 36.6 (*)    All other components within normal limits  COMPREHENSIVE METABOLIC PANEL - Abnormal; Notable for the following components:   Total Protein 8.4 (*)  AST 14 (*)    Total Bilirubin 1.3 (*)    All other components within normal limits  PROTIME-INR  TROPONIN I (HIGH SENSITIVITY)  TROPONIN I (HIGH SENSITIVITY)    EKG None  Radiology CT Angio Chest PE W and/or Wo Contrast  Result Date: 08/14/2021 CLINICAL DATA:  Hemoptysis. EXAM: CT ANGIOGRAPHY CHEST WITH CONTRAST TECHNIQUE: Multidetector CT imaging of the chest was performed using the standard protocol during bolus administration of intravenous contrast. Multiplanar CT image reconstructions and MIPs were obtained to evaluate the vascular anatomy. CONTRAST:  116mL OMNIPAQUE IOHEXOL 350 MG/ML SOLN COMPARISON:  Chest radiograph dated 01/08/2021. FINDINGS: Cardiovascular: No cardiomegaly or pericardial effusion. The thoracic aorta is unremarkable. Evaluation of the pulmonary arteries is very limited due to respiratory motion artifact and suboptimal opacification and timing of the contrast. No large or central pulmonary artery embolus identified. Mediastinum/Nodes: Right hilar adenopathy measures 2.5 x 2.4 cm. The esophagus is grossly unremarkable. No mediastinal fluid collection. Lungs/Pleura: Background of centrilobular emphysema with large biapical subpleural blebs. There is consolidative changes of the right middle lobe with volume loss and areas of small cystic changes. Although findings may represent atelectasis or pneumonia underlying mass is not excluded. Follow-up to  resolution is recommended. There is patchy area of nodular density in the right lower lobe consistent with pneumonia. Aspiration is not excluded. Mucus secretions noted in the trachea and mainstem bronchi. The central airways remain patent. No pleural effusion or pneumothorax. Upper Abdomen: No acute abnormality. Musculoskeletal: No chest wall abnormality. No acute or significant osseous findings. Review of the MIP images confirms the above findings. IMPRESSION: 1. No CT evidence of central pulmonary artery embolus. 2. Right middle lobe atelectasis or pneumonia. Underlying mass is not excluded. Follow-up to resolution is recommended. 3. Patchy area of nodular density in the right lower lobe consistent with pneumonia. Aspiration is not excluded. 4. Right hilar adenopathy, likely reactive. 5. Emphysema (ICD10-J43.9). Electronically Signed   By: Anner Crete M.D.   On: 08/14/2021 19:59    Procedures Procedures   Medications Ordered in ED Medications  cefTRIAXone (ROCEPHIN) 1 g in sodium chloride 0.9 % 100 mL IVPB (has no administration in time range)  azithromycin (ZITHROMAX) 500 mg in sodium chloride 0.9 % 250 mL IVPB (has no administration in time range)  iohexol (OMNIPAQUE) 350 MG/ML injection 100 mL (100 mLs Intravenous Contrast Given 08/14/21 1937)    ED Course  I have reviewed the triage vital signs and the nursing notes.  Pertinent labs & imaging results that were available during my care of the patient were reviewed by me and considered in my medical decision making (see chart for details).    MDM Rules/Calculators/A&P                           Patient seen the emergency department for evaluation of hemoptysis and cough.  Physical exam largely unremarkable.  Laboratory evaluation reveals a hemoglobin of 11.9 which is actually an improvement for this patient.  Laboratory evaluation otherwise unremarkable.  Coags normal.  CT PE with no evidence of PE, but does show a right middle lobe  pneumonia with possible mass.  I am concerned that if we discharge patient home back to his facility and he continues to have hemoptysis, he is a high risk for bounce back therapy and will be started on IV antibiotics and observed in the hospital to ensure he does not have massive hemoptysis.  Patient then admitted to medicine.  Final Clinical Impression(s) / ED Diagnoses Final diagnoses:  None    Rx / DC Orders ED Discharge Orders     None        Norbert Malkin, Debe Coder, MD 08/14/21 2042

## 2021-08-15 DIAGNOSIS — Z7982 Long term (current) use of aspirin: Secondary | ICD-10-CM | POA: Diagnosis not present

## 2021-08-15 DIAGNOSIS — J189 Pneumonia, unspecified organism: Secondary | ICD-10-CM | POA: Diagnosis present

## 2021-08-15 DIAGNOSIS — I129 Hypertensive chronic kidney disease with stage 1 through stage 4 chronic kidney disease, or unspecified chronic kidney disease: Secondary | ICD-10-CM | POA: Diagnosis present

## 2021-08-15 DIAGNOSIS — Z800 Family history of malignant neoplasm of digestive organs: Secondary | ICD-10-CM | POA: Diagnosis not present

## 2021-08-15 DIAGNOSIS — I10 Essential (primary) hypertension: Secondary | ICD-10-CM | POA: Diagnosis not present

## 2021-08-15 DIAGNOSIS — Z87891 Personal history of nicotine dependence: Secondary | ICD-10-CM | POA: Diagnosis not present

## 2021-08-15 DIAGNOSIS — E785 Hyperlipidemia, unspecified: Secondary | ICD-10-CM

## 2021-08-15 DIAGNOSIS — F419 Anxiety disorder, unspecified: Secondary | ICD-10-CM | POA: Diagnosis present

## 2021-08-15 DIAGNOSIS — R042 Hemoptysis: Secondary | ICD-10-CM | POA: Diagnosis present

## 2021-08-15 DIAGNOSIS — Z8701 Personal history of pneumonia (recurrent): Secondary | ICD-10-CM | POA: Diagnosis not present

## 2021-08-15 DIAGNOSIS — N182 Chronic kidney disease, stage 2 (mild): Secondary | ICD-10-CM | POA: Diagnosis present

## 2021-08-15 DIAGNOSIS — Z8673 Personal history of transient ischemic attack (TIA), and cerebral infarction without residual deficits: Secondary | ICD-10-CM | POA: Diagnosis not present

## 2021-08-15 DIAGNOSIS — R1310 Dysphagia, unspecified: Secondary | ICD-10-CM | POA: Diagnosis present

## 2021-08-15 DIAGNOSIS — I69391 Dysphagia following cerebral infarction: Secondary | ICD-10-CM | POA: Diagnosis not present

## 2021-08-15 DIAGNOSIS — J690 Pneumonitis due to inhalation of food and vomit: Secondary | ICD-10-CM | POA: Diagnosis present

## 2021-08-15 DIAGNOSIS — J45909 Unspecified asthma, uncomplicated: Secondary | ICD-10-CM | POA: Diagnosis present

## 2021-08-15 DIAGNOSIS — F0390 Unspecified dementia without behavioral disturbance: Secondary | ICD-10-CM | POA: Diagnosis present

## 2021-08-15 DIAGNOSIS — F32A Depression, unspecified: Secondary | ICD-10-CM | POA: Diagnosis present

## 2021-08-15 DIAGNOSIS — Z833 Family history of diabetes mellitus: Secondary | ICD-10-CM | POA: Diagnosis not present

## 2021-08-15 DIAGNOSIS — R040 Epistaxis: Secondary | ICD-10-CM | POA: Diagnosis present

## 2021-08-15 DIAGNOSIS — E876 Hypokalemia: Secondary | ICD-10-CM | POA: Diagnosis not present

## 2021-08-15 DIAGNOSIS — Z20822 Contact with and (suspected) exposure to covid-19: Secondary | ICD-10-CM | POA: Diagnosis present

## 2021-08-15 DIAGNOSIS — A419 Sepsis, unspecified organism: Secondary | ICD-10-CM | POA: Diagnosis present

## 2021-08-15 DIAGNOSIS — I69354 Hemiplegia and hemiparesis following cerebral infarction affecting left non-dominant side: Secondary | ICD-10-CM | POA: Diagnosis not present

## 2021-08-15 DIAGNOSIS — K921 Melena: Secondary | ICD-10-CM | POA: Diagnosis present

## 2021-08-15 DIAGNOSIS — D631 Anemia in chronic kidney disease: Secondary | ICD-10-CM | POA: Diagnosis present

## 2021-08-15 LAB — CBC
HCT: 35.7 % — ABNORMAL LOW (ref 39.0–52.0)
Hemoglobin: 11.7 g/dL — ABNORMAL LOW (ref 13.0–17.0)
MCH: 29 pg (ref 26.0–34.0)
MCHC: 32.8 g/dL (ref 30.0–36.0)
MCV: 88.4 fL (ref 80.0–100.0)
Platelets: 241 10*3/uL (ref 150–400)
RBC: 4.04 MIL/uL — ABNORMAL LOW (ref 4.22–5.81)
RDW: 13.6 % (ref 11.5–15.5)
WBC: 5.8 10*3/uL (ref 4.0–10.5)
nRBC: 0 % (ref 0.0–0.2)

## 2021-08-15 LAB — SARS CORONAVIRUS 2 (TAT 6-24 HRS): SARS Coronavirus 2: NEGATIVE

## 2021-08-15 LAB — EXPECTORATED SPUTUM ASSESSMENT W GRAM STAIN, RFLX TO RESP C

## 2021-08-15 LAB — STREP PNEUMONIAE URINARY ANTIGEN: Strep Pneumo Urinary Antigen: NEGATIVE

## 2021-08-15 MED ORDER — LISINOPRIL 20 MG PO TABS: 40.0000 mg | ORAL_TABLET | Freq: Every day | ORAL | Status: DC

## 2021-08-15 MED ORDER — IPRATROPIUM-ALBUTEROL 0.5-2.5 (3) MG/3ML IN SOLN
3.0000 mL | Freq: Once | RESPIRATORY_TRACT | Status: AC
  Administered 2021-08-15: 3 mL via RESPIRATORY_TRACT
  Filled 2021-08-15: qty 3

## 2021-08-15 MED ORDER — GUAIFENESIN-DM 100-10 MG/5ML PO SYRP: 10.0000 mL | ORAL_SOLUTION | ORAL | Status: DC | PRN

## 2021-08-15 MED ORDER — SODIUM CHLORIDE 0.9 % IV SOLN
3.0000 g | Freq: Four times a day (QID) | INTRAVENOUS | Status: DC
  Administered 2021-08-15 – 2021-08-17 (×8): 3 g via INTRAVENOUS
  Filled 2021-08-15 (×9): qty 8

## 2021-08-15 NOTE — Consult Note (Signed)
NAME:  Darren Pearson, MRN:  [id number], DOB:  04-Aug-1966, LOS: 0 ADMISSION DATE:  08/14/2021, CONSULTATION DATE: 08/15/2021 REFERRING MD: Dr. Karleen Hampshire, CHIEF COMPLAINT: Hemoptysis  History of Present Illness:  55 year old man, former smoker, with a history of poorly controlled hypertension, CVA's with associated mild dysphagia, dementia, hepatitis C, CKD stage II.  He resides in a skilled nursing facility due to his dementia.  History of right middle lobe pneumonia thought related to aspiration event in 12/2020.  Difficult historian but states that he began to experience cough about 2 days ago, then began to see hemoptysis.  Difficult to quantify but possibly some clots.  No witnessed aspiration event.  He also reports some epistaxis beginning yesterday, ? How this may be contributing. CT-PA performed and showed no PE but a focal posterior right middle lobe infiltrate with some distal central clearing, question abscess.  He was started on ceftriaxone and azithromycin.  PCCM consulted regarding the hemoptysis.   Pertinent  Medical History   Past Medical History:  Diagnosis Date   Allergy    Anemia    Anxiety and depression    Asthma    CVA (cerebral vascular accident) ([place])    Hepatitis C    History of chicken pox    Hypertension     Significant Hospital Events: Including procedures, antibiotic start and stop dates in addition to other pertinent events   CT-PA 08/14/2021 >> bilateral apical bullae, posterior right middle lobe consolidative change with some cystic central clearing more peripherally, more subtle patchy micronodular infiltrate in the right lower lobe, 2.5 cm right hilar lymphadenopathy  Interim History / Subjective:     Objective   Blood pressure (!) 155/104, pulse (!) 106, temperature 98.9 F (37.2 C), temperature source Oral, resp. rate 19, height 5\' 9"  (1.753 m), weight 69 kg, SpO2 94 %.        Intake/Output Summary (Last 24 hours) at 08/15/2021 1437 Last data filed at  08/15/2021 7829 Gross per 24 hour  Intake 100 ml  Output 500 ml  Net -400 ml   Filed Weights   08/14/21 1529 08/14/21 2245  Weight: 72.6 kg 69 kg    Examination: General: well appearing, laying comfortably HENT: OP clear, no apparent blood from nares Lungs: distant, few coarse BS but no crackles or wheezes Cardiovascular: regular, distant, no M Abdomen: soft, obese, NT Extremities: trace edema Neuro: awake, poorly oriented, follows commands  Resolved Hospital Problem list     Assessment & Plan:  Hemoptysis, small-volume, in the setting of right middle lobe infiltrate.  Difficult to say whether the infiltrate reflects a new pneumonia versus rounded atelectasis from his right middle lobe pneumonia in February 2022.  There is lateral central clearing and cystic change which would suggest an evolving process although absence of leukocytosis would argue against a new pneumonia. Also, he reports some epistaxis - ?? Whther this may be the bleeding source, leading to some hemoptysis.  -Agree with treatment for possible aspiration pneumonia, right middle lobe lung abscess.  Agree with altering antibiotics to get better anaerobic coverage > Unasyn -Follow cough and degree of hemoptysis.  If remains low volume then would advocate following him clinically on antibiotics to see if it clears.  If progressive then will be reasonable to perform inspection bronchoscopy to evaluate for occult endobronchial lesion (none seen on CT) -Would plan for repeat CT chest in about 6 weeks even if bronchoscopy is unnecessary to assess for improvement in the right middle lobe abnormality, right  middle lobe cavitary lesion.  Even if his pneumonia and hemoptysis improve he may still require bronchoscopy at some point if radiographic persistence or progression. -could consider ENT eval if we believe that epistaxis may be the actual source of bloed -Swallowing precautions -Hold off on any anticoagulation -Tighten blood  pressure control  -cough suppression; may need to consider an alternative to lisinopril if the cough persists  Best Practice (right click and "Reselect all SmartList Selections" daily)  Per [facility] plans  Labs   CBC: Recent Labs  Lab 08/14/21 1642 08/15/21 0523  WBC 7.5 5.8  NEUTROABS 4.6  --   HGB 11.9* 11.7*  HCT 36.6* 35.7*  MCV 89.1 88.4  PLT 249 147    Basic Metabolic Panel: Recent Labs  Lab 08/14/21 1642  NA 138  K 3.5  CL 103  CO2 25  GLUCOSE 88  BUN 19  CREATININE 0.93  CALCIUM 8.9   GFR: Estimated Creatinine Clearance: 86.6 mL/min (by C-G formula based on SCr of 0.93 mg/dL). Recent Labs  Lab 08/14/21 1642 08/15/21 0523  WBC 7.5 5.8    Liver Function Tests: Recent Labs  Lab 08/14/21 1642  AST 14*  ALT 10  ALKPHOS 96  BILITOT 1.3*  PROT 8.4*  ALBUMIN 3.6   No results for input(s): LIPASE, AMYLASE in the last 168 hours. No results for input(s): AMMONIA in the last 168 hours.  ABG    Component Value Date/Time   TCO2 21 (L) 05/09/2020 1222     Coagulation Profile: Recent Labs  Lab 08/14/21 1642  INR 1.1    Cardiac Enzymes: No results for input(s): CKTOTAL, CKMB, CKMBINDEX, TROPONINI in the last 168 hours.  HbA1C: Hgb A1c MFr Bld  Date/Time Value Ref Range Status  05/10/2020 04:10 AM 5.3 4.8 - 5.6 % Final    Comment:    (NOTE)         Prediabetes: 5.7 - 6.4         Diabetes: >6.4         Glycemic control for adults with diabetes: <7.0   11/22/2019 03:03 AM 4.2 (L) 4.8 - 5.6 % Final    Comment:    (NOTE) Pre diabetes:          5.7%-6.4% Diabetes:              >6.4% Glycemic control for   <7.0% adults with diabetes     CBG: No results for input(s): GLUCAP in the last 168 hours.  Review of Systems:   Still w cough May have had some epistaxis beginning when the cough did  Past Medical History:  He,  has a past medical history of Allergy, Anemia, Anxiety and depression, Asthma, CVA (cerebral vascular accident) ([place]),  Hepatitis C, History of chicken pox, and Hypertension.   Surgical History:   Past Surgical History:  Procedure Laterality Date   BUBBLE STUDY  11/22/2019   Procedure: BUBBLE STUDY;  Surgeon: Dorothy Spark, MD;  Location: [facility];  Service: Cardiovascular;;   LOOP RECORDER INSERTION N/A 11/22/2019   Procedure: LOOP RECORDER INSERTION;  Surgeon: Thompson Grayer, MD;  Location: [facility];  Service: Cardiovascular;  Laterality: N/A;   TEE WITHOUT CARDIOVERSION N/A 11/22/2019   Procedure: TRANSESOPHAGEAL ECHOCARDIOGRAM (TEE);  Surgeon: Dorothy Spark, MD;  Location: [facility] ENDOS[facility];  Service: Cardiovascular;  Laterality: N/A;     Social History:   reports that he has quit smoking. His smoking use included cigarettes. He started smoking about 8 years ago.  He smoked an average of .25 packs per day. He has never used smokeless tobacco. He reports that he does not currently use alcohol after a past usage of about 2.0 standard drinks per week. He reports that he does not currently use drugs after having used the following drugs: Marijuana. Frequency: 5.00 times per week.   Family History:  His family history includes Diabetes in his father; Paget's disease of bone in his father; Stomach cancer (age of onset: 72) in his mother. There is no history of Colon cancer, Esophageal cancer, Pancreatic cancer, or Liver disease.   Allergies Allergies  Allergen Reactions   Sulfa Antibiotics Hives     Home Medications  Prior to Admission medications   Medication Sig Start Date End Date Taking? Authorizing Provider  aspirin 81 MG EC tablet Take 1 tablet (81 mg total) by mouth daily. 11/24/19  Yes Kathie Dike, MD  atorvastatin (LIPITOR) 40 MG tablet Take 1 tablet (40 mg total) by mouth daily at 6 PM. 11/23/19  Yes Memon, Jolaine Artist, MD  cyanocobalamin 1000 MCG tablet Take 1 tablet (1,000 mcg total) by mouth daily. 11/24/19  Yes Kathie Dike, MD  ferrous sulfate 325 (65 FE) MG tablet  Take 325 mg by mouth daily with breakfast.   Yes [provider]  lisinopril (ZESTRIL) 20 MG tablet Take 1 tablet (20 mg total) by mouth daily. 05/24/20 08/14/21 Yes Charlynne Cousins, MD  sertraline (ZOLOFT) 50 MG tablet Take 50 mg by mouth daily.   Yes [provider]  azithromycin (ZITHROMAX) 500 MG tablet Take 1 tablet (500 mg total) by mouth daily. Patient not taking: No sig reported 01/09/21   Antonieta Pert, MD  polyethylene glycol (MIRALAX / GLYCOLAX) 17 g packet Take 17 g by mouth daily as needed for mild constipation. Patient not taking: No sig reported 01/09/21   Antonieta Pert, MD  polyethylene glycol powder (GLYCOLAX/MIRALAX) 17 GM/SCOOP powder TAKE 17 G BY MOUTH DAILY AS NEEDED FOR MILD CONSTIPATION. Patient taking differently: Take 17 g by mouth daily as needed for moderate constipation or mild constipation. 01/09/21 01/09/22  Antonieta Pert, MD     Critical care time: NA     Baltazar Apo, MD, PhD 08/15/2021, 4:14 PM [facility] [phone] or if no answer before 7:00PM call [phone] For any issues after 7:00PM please call eLink [phone]

## 2021-08-15 NOTE — Evaluation (Signed)
SLP Cancellation Note  Patient Details Name: Darren Pearson MRN: [id number] DOB: May 19, 1966   Cancelled treatment:       Reason Eval/Treat Not Completed: Other (comment) (per RN, pt currently coughing up blood when SLP inquired it pt was appropriate to see for swallow eval- will continue efforts).  Per RN, MD on way to see pt via bedside.   Kathleen Lime, MS [facility] SLP Acute Rehab Services Office [phone] Pager [phone]  Macario Golds 08/15/2021, 2:18 PM

## 2021-08-15 NOTE — Progress Notes (Signed)
Pharmacy Antibiotic Note  Darren Pearson is a 55 y.o. male admitted on 08/14/2021 with aspiration pneumonia.  Pharmacy has been consulted for Unasyn dosing.  Plan: Unasyn 3g IV q6h Follow up renal function, culture results, and clinical course.   Height: 5\' 9"  (175.3 cm) Weight: 69 kg (152 lb 1.9 oz) IBW/kg (Calculated) : 70.7  Temp (24hrs), Avg:99 F (37.2 C), Min:98.7 F (37.1 C), Max:99.4 F (37.4 C)  Recent Labs  Lab 08/14/21 1642 08/15/21 0523  WBC 7.5 5.8  CREATININE 0.93  --     Estimated Creatinine Clearance: 86.6 mL/min (by C-G formula based on SCr of 0.93 mg/dL).    Allergies  Allergen Reactions   Sulfa Antibiotics Hives    Antimicrobials this admission: 9/21 Ceftriaxone >> 9/22 9/21 Azithromycin >> 9/22 9/22 Unasyn >>   Dose adjustments this admission:   Microbiology results: 9/21 Covid: negative 9/22 Sputum:  9/21 Strep pneumo UrAg: negative 9/21 Legionella UrAg:   Thank you for allowing pharmacy to be a part of this patient's care.  Gretta Arab PharmD, BCPS Clinical Pharmacist [facility] main pharmacy [phone] 08/15/2021 12:54 PM

## 2021-08-15 NOTE — Progress Notes (Addendum)
PROGRESS NOTE    Darren Pearson  YQI:[id number] DOB: 08/09/1966 DOA: 08/14/2021 PCP: Thompson Grayer, MD    Chief Complaint  Patient presents with   Hemoptysis    Brief Narrative:  55 year old gentleman with prior history of dementia, CVA, hypertension, asthma, stage II CKD, hepatitis C was brought into ED for evaluation of hemoptysis.  Sister over the phone reports that patient had a severe episode of coughing and had phlegm with some blood in it.  Patient at bedside appears comfortable, currently on 2 L of nasal cannula oxygen to keep sats greater than 90%. He was admitted for evaluation and management of aspiration pneumonia. CT angiogram of the chest shows pneumonia in the right middle and right lower lobe concerning for aspiration  Assessment & Plan:   Active Problems:   History of CVA (cerebrovascular accident)   Sepsis due to pneumonia ([place])   Aspiration pneumonia ([place])   HLD (hyperlipidemia)   HTN (hypertension)   Sepsis secondary to right middle and right lower lobe pneumonia CT of the chest concerning for aspiration pneumonia.  Recommend repeat imaging following resolution of symptoms given underlying mass not excluded on CT. Initially started on IV Rocephin and Zithromax and transition to Unasyn. Strep pneumonia is negative Legionella antigen is pending Sputum culture if able Nasal cannula oxygen to keep sats greater than 90% SLP evaluation to evaluate for aspiration. Therapy evaluations ordered.     Essential hypertension suboptimally controlled currently on lisinopril Will increase the dose of lisinopril and continue with IV labetalol as needed   History of CVA Continue with aspirin and statin. Therapy evaluations ordered.   History of dementia no behavioral abnormalities Patient oriented to self and place only.   Mild anemia/anemia of chronic disease/normocytic anemia Patient's hemoglobin is better than in February 2022 Continue to monitor No  episode of hemoptysis so far   DVT prophylaxis: Lovenox Code Status: Full code Family Communication: Discussed with sister over the phone Disposition:   Status is: Observation  The patient will require care spanning > 2 midnights and should be moved to inpatient because: Ongoing diagnostic testing needed not appropriate for outpatient work up, Unsafe d/c plan, and IV treatments appropriate due to intensity of illness or inability to take PO  Dispo: The patient is from: SNF              Anticipated d/c is to: SNF              Patient currently is not medically stable to d/c.   Difficult to place patient No       Consultants:  None.   Procedures: none.  Antimicrobials:  Antibiotics Given (last 72 hours)     Date/Time Action Medication Dose Rate   08/14/21 2110 New Bag/Given   cefTRIAXone (ROCEPHIN) 1 g in sodium chloride 0.9 % 100 mL IVPB 1 g 200 mL/hr   08/14/21 2147 New Bag/Given   azithromycin (ZITHROMAX) 500 mg in sodium chloride 0.9 % 250 mL IVPB 500 mg 250 mL/hr        Subjective: No new complaints.   Objective: Vitals:   08/15/21 0609 08/15/21 0647 08/15/21 0651 08/15/21 1043  BP: (!) 156/99   (!) 156/103  Pulse: 83   90  Resp: 14   18  Temp: 98.8 F (37.1 C)   98.9 F (37.2 C)  TempSrc: Oral   Oral  SpO2: 97% 95% 99% 98%  Weight:      Height:  Intake/Output Summary (Last 24 hours) at 08/15/2021 1123 Last data filed at 08/15/2021 3419 Gross per 24 hour  Intake 100 ml  Output 500 ml  Net -400 ml   Filed Weights   08/14/21 1529 08/14/21 2245  Weight: 72.6 kg 69 kg    Examination:  General exam: Appears calm and comfortable  Respiratory system: Clear to auscultation. Respiratory effort normal. Cardiovascular system: S1 & S2 heard, RRR. No JVD, murmurs, rubs, gallops or clicks. No pedal edema. Gastrointestinal system: Abdomen is nondistended, soft and nontender. No organomegaly or masses felt. Normal bowel sounds heard. Central nervous  system: Alert and oriented. No focal neurological deficits. Extremities: Symmetric 5 x 5 power. Skin: No rashes, lesions or ulcers Psychiatry: Judgement and insight appear normal. Mood & affect appropriate.     Data Reviewed: I have personally reviewed following labs and imaging studies  CBC: Recent Labs  Lab 08/14/21 1642 08/15/21 0523  WBC 7.5 5.8  NEUTROABS 4.6  --   HGB 11.9* 11.7*  HCT 36.6* 35.7*  MCV 89.1 88.4  PLT 249 622    Basic Metabolic Panel: Recent Labs  Lab 08/14/21 1642  NA 138  K 3.5  CL 103  CO2 25  GLUCOSE 88  BUN 19  CREATININE 0.93  CALCIUM 8.9    GFR: Estimated Creatinine Clearance: 86.6 mL/min (by C-G formula based on SCr of 0.93 mg/dL).  Liver Function Tests: Recent Labs  Lab 08/14/21 1642  AST 14*  ALT 10  ALKPHOS 96  BILITOT 1.3*  PROT 8.4*  ALBUMIN 3.6    CBG: No results for input(s): GLUCAP in the last 168 hours.   Recent Results (from the past 240 hour(s))  SARS CORONAVIRUS 2 (TAT 6-24 HRS) Nasopharyngeal Nasopharyngeal Swab     Status: None   Collection Time: 08/14/21  9:17 PM   Specimen: Nasopharyngeal Swab  Result Value Ref Range Status   SARS Coronavirus 2 NEGATIVE NEGATIVE Final    Comment: (NOTE) SARS-CoV-2 target nucleic acids are NOT DETECTED.  The SARS-CoV-2 RNA is generally detectable in upper and lower respiratory specimens during the acute phase of infection. Negative results do not preclude SARS-CoV-2 infection, do not rule out co-infections with other pathogens, and should not be used as the sole basis for treatment or other patient management decisions. Negative results must be combined with clinical observations, patient history, and epidemiological information. The expected result is Negative.  Fact Sheet for Patients: SugarRoll.be  Fact Sheet for Healthcare Providers: https://www.woods-mathews.com/  This test is not yet approved or cleared by the [place] FDA and  has been authorized for detection and/or diagnosis of SARS-CoV-2 by FDA under an Emergency Use Authorization (EUA). This EUA will remain  in effect (meaning this test can be used) for the duration of the COVID-19 declaration under Se ction 564(b)(1) of the Act, 21 U.S.C. section 360bbb-3(b)(1), unless the authorization is terminated or revoked sooner.  Performed at [facility], [place] [address], [place], [place] [address]   Expectorated Sputum Assessment w Gram Stain, Rflx to Resp Cult     Status: None   Collection Time: 08/15/21  6:47 AM   Specimen: Expectorated Sputum  Result Value Ref Range Status   Specimen Description EXPECTORATED SPUTUM  Final   Special Requests NONE  Final   Sputum evaluation   Final    THIS SPECIMEN IS ACCEPTABLE FOR SPUTUM CULTURE Performed at [facility], [place] [address] [place], [place] [address]    Report Status 08/15/2021  FINAL  Final         Radiology Studies: CT Angio Chest PE W and/or Wo Contrast  Result Date: 08/14/2021 CLINICAL DATA:  Hemoptysis. EXAM: CT ANGIOGRAPHY CHEST WITH CONTRAST TECHNIQUE: Multidetector CT imaging of the chest was performed using the standard protocol during bolus administration of intravenous contrast. Multiplanar CT image reconstructions and MIPs were obtained to evaluate the vascular anatomy. CONTRAST:  152mL OMNIPAQUE IOHEXOL 350 MG/ML SOLN COMPARISON:  Chest radiograph dated 01/08/2021. FINDINGS: Cardiovascular: No cardiomegaly or pericardial effusion. The thoracic aorta is unremarkable. Evaluation of the pulmonary arteries is very limited due to respiratory motion artifact and suboptimal opacification and timing of the contrast. No large or central pulmonary artery embolus identified. Mediastinum/Nodes: Right hilar adenopathy measures 2.5 x 2.4 cm. The esophagus is grossly unremarkable. No mediastinal fluid collection. Lungs/Pleura: Background of centrilobular emphysema with large  biapical subpleural blebs. There is consolidative changes of the right middle lobe with volume loss and areas of small cystic changes. Although findings may represent atelectasis or pneumonia underlying mass is not excluded. Follow-up to resolution is recommended. There is patchy area of nodular density in the right lower lobe consistent with pneumonia. Aspiration is not excluded. Mucus secretions noted in the trachea and mainstem bronchi. The central airways remain patent. No pleural effusion or pneumothorax. Upper Abdomen: No acute abnormality. Musculoskeletal: No chest wall abnormality. No acute or significant osseous findings. Review of the MIP images confirms the above findings. IMPRESSION: 1. No CT evidence of central pulmonary artery embolus. 2. Right middle lobe atelectasis or pneumonia. Underlying mass is not excluded. Follow-up to resolution is recommended. 3. Patchy area of nodular density in the right lower lobe consistent with pneumonia. Aspiration is not excluded. 4. Right hilar adenopathy, likely reactive. 5. Emphysema (ICD10-J43.9). Electronically Signed   By: Anner Crete M.D.   On: 08/14/2021 19:59        Scheduled Meds:  aspirin EC  81 mg Oral Daily   atorvastatin  40 mg Oral q1800   azithromycin  500 mg Oral Daily   enoxaparin (LOVENOX) injection  40 mg Subcutaneous Q24H   ferrous sulfate  325 mg Oral Q breakfast   lisinopril  20 mg Oral Daily   sertraline  50 mg Oral Daily   cyanocobalamin  1,000 mcg Oral Daily   Continuous Infusions:  cefTRIAXone (ROCEPHIN)  IV       LOS: 0 days    Time spent: 38 minutes    Hosie Poisson, MD [facility]   To contact the attending provider between 7A-7P or the covering provider during after hours 7P-7A, please log into the web site [email or web address] and access using universal [place] password for that web site. If you do not have the password, please call the hospital operator.  08/15/2021, 11:23 AM    Addendum   Notified by RN that pt had another episode of hemoptysis about 10 ml  this afternoon after lunch  On exam, he is alert and following commands.  Scattered wheezing heard on the right posteriorly.  Oxygen sats at 97% on 2 lit of [place] oxygen.  Discontinued lovenox and aspirin.  Requested PCCM to see if he needs a bronchoscopy.  Rest of the vitals are wnl.   Hosie Poisson, MD

## 2021-08-16 DIAGNOSIS — R042 Hemoptysis: Secondary | ICD-10-CM | POA: Diagnosis not present

## 2021-08-16 LAB — LEGIONELLA PNEUMOPHILA SEROGP 1 UR AG: L. pneumophila Serogp 1 Ur Ag: NEGATIVE

## 2021-08-16 MED ORDER — HYDRALAZINE HCL 25 MG PO TABS
25.0000 mg | ORAL_TABLET | Freq: Three times a day (TID) | ORAL | Status: DC
  Administered 2021-08-16 – 2021-08-18 (×8): 25 mg via ORAL
  Filled 2021-08-16 (×9): qty 1

## 2021-08-16 MED ORDER — GUAIFENESIN-CODEINE 100-10 MG/5ML PO SOLN: 5.0000 mL | Freq: Four times a day (QID) | ORAL | Status: DC | PRN

## 2021-08-16 MED ORDER — OXYMETAZOLINE HCL 0.05 % NA SOLN
1.0000 | Freq: Two times a day (BID) | NASAL | Status: DC
  Administered 2021-08-16 – 2021-08-18 (×5): 1 via NASAL
  Filled 2021-08-16: qty 15

## 2021-08-16 MED ORDER — LORATADINE 10 MG PO TABS
10.0000 mg | ORAL_TABLET | Freq: Every day | ORAL | Status: DC
  Administered 2021-08-16 – 2021-08-19 (×4): 10 mg via ORAL
  Filled 2021-08-16 (×4): qty 1

## 2021-08-16 MED ORDER — SALINE SPRAY 0.65 % NA SOLN
1.0000 | NASAL | Status: AC
Start: 2021-08-16 — End: 2021-08-17
  Administered 2021-08-16 – 2021-08-17 (×6): 1 via NASAL
  Filled 2021-08-16: qty 44

## 2021-08-16 MED ORDER — DIPHENHYDRAMINE HCL 25 MG PO CAPS
25.0000 mg | ORAL_CAPSULE | Freq: Three times a day (TID) | ORAL | Status: DC | PRN
  Administered 2021-08-16: 25 mg via ORAL
  Filled 2021-08-16: qty 1

## 2021-08-16 MED ORDER — AMLODIPINE BESYLATE 5 MG PO TABS
5.0000 mg | ORAL_TABLET | Freq: Every day | ORAL | Status: DC
  Administered 2021-08-16 – 2021-08-19 (×4): 5 mg via ORAL
  Filled 2021-08-16 (×4): qty 1

## 2021-08-16 NOTE — Evaluation (Signed)
Occupational Therapy Evaluation Patient Details Name: Darren Pearson MRN: [id number] DOB: [birth year] Today's Date: 08/16/2021   History of Present Illness 55 year old man, former smoker, with a history of poorly controlled hypertension, CVA's with associated mild dysphagia and left sided weakness, dementia, hepatitis C, CKD stage II.  He resides in a skilled nursing facility due to his dementia. Presents with hemoptysis in the setting of right middle lobe pneumonia.   Clinical Impression   [name] is a 55 year old man who is a resident of a local nursing facility. Patient has dementia and is alert to self but an unreliable historian therefore unsure of his PLOF. He reports he could walk with a walker and perform ADLs - however expect patient had assistance with ADLs. On evaluation he needed assistance with transferring to edge of bed, standing and taking steps to recliner with walker. He was able to grossly brush his teeth and wash his face. He is able to feed himself. He was unable to don his socks but he did attempt. He needs verbal cues for all tasks. His verbalizations are minimal but he can follow simple commands. Patient will benefit from skilled OT services while in hospital to improve deficits and maximize functional abilities to reduce caregiver burden. Recommend return to facility at discharge.       Recommendations for follow up therapy are one component of a multi-disciplinary discharge planning process, led by the attending physician.  Recommendations may be updated based on patient status, additional functional criteria and insurance authorization.   Follow Up Recommendations  SNF    Equipment Recommendations  None recommended by OT    Recommendations for Other Services       Precautions / Restrictions Precautions Precautions: Fall;Other (comment) Precaution Comments: continues to have bloody hemoptysis Restrictions Weight Bearing Restrictions: No      Mobility  Bed Mobility Overal bed mobility: Needs Assistance Bed Mobility: Supine to Sit     Supine to sit: Min assist;HOB elevated     General bed mobility comments: min assist for hand hold to pull up on and assist to pivot hips fully around.    Transfers Overall transfer level: Needs assistance Equipment used: Rolling walker (2 wheeled) Transfers: Sit to/from [facility] Sit to Stand: Mod assist Stand pivot transfers: Mod assist       General transfer comment: Mod assist to rise from low bed height. Exhibited posterior lean. Mod assist for tactile cues, walker management and stabilizing to reduce posterior lean to take steps to the recilner with RW.    Balance Overall balance assessment: Needs assistance Sitting-balance support: No upper extremity supported Sitting balance-Leahy Scale: Good   Postural control: Posterior lean Standing balance support: During functional activity Standing balance-Leahy Scale: Poor Standing balance comment: reliant on external assistance.                           ADL either performed or assessed with clinical judgement   ADL Overall ADL's : Needs assistance/impaired Eating/Feeding: Set up;Sitting   Grooming: Set up;Sitting;Cueing for sequencing;Oral care;Wash/dry face Grooming Details (indicate cue type and reason): needs cues to perform Upper Body Bathing: Moderate assistance;Sitting;Set up;Cueing for sequencing   Lower Body Bathing: Maximal assistance;Sitting/lateral leans;Cueing for sequencing;Set up   Upper Body Dressing : Moderate assistance;Sitting;Cueing for sequencing   Lower Body Dressing: Maximal assistance;Sit to/from stand;Cueing for sequencing   Toilet Transfer: Minimal assistance;Stand-pivot;BSC   Toileting- Clothing Manipulation and Hygiene: Maximal assistance;Sit to/from  stand               Vision   Vision Assessment?: No apparent visual deficits     Perception     Praxis       Pertinent Vitals/Pain Pain Assessment: No/denies pain     Hand Dominance Left   Extremity/Trunk Assessment Upper Extremity Assessment Upper Extremity Assessment: RUE deficits/detail;LUE deficits/detail RUE Deficits / Details: less than 90 degrees shoulder ROM, otherwise grossly functional ROM, 4/5 strength throughout. RUE Coordination:  (grossly functional but slow.) LUE Deficits / Details: less than 90 degrees active shoulder ROM, missing 1/2 of 3rd digit and 4th and 5th digit, elbow strenght grossly 4/5, wrist stiff near neutral 5/5 strength. LUE Coordination: decreased fine motor (lacking digits but continues to feed himself and use tooth brush with this hand.)   Lower Extremity Assessment Lower Extremity Assessment: Defer to PT evaluation   Cervical / Trunk Assessment Cervical / Trunk Assessment: Normal   Communication Communication Communication: No difficulties   Cognition Arousal/Alertness: Awake/alert Behavior During Therapy: WFL for tasks assessed/performed Overall Cognitive Status: History of cognitive impairments - at baseline                                 General Comments: Hx of dementia. Alert to self and knows he is at [facility] but otherwise not oriented.   General Comments       Exercises     Shoulder Instructions      Home Living Family/patient expects to be discharged to:: Skilled nursing facility                                 Additional Comments: Pt is a resident of SNF      Prior Functioning/Environment Level of Independence: Needs assistance  Gait / Transfers Assistance Needed: Patient is an unreliable historian - but reports he walks with a walker. ADL's / Homemaking Assistance Needed: Assist with ADLs            OT Problem List: Decreased range of motion;Decreased activity tolerance;Impaired balance (sitting and/or standing);Decreased cognition;Decreased safety awareness;Decreased  coordination;Decreased knowledge of use of DME or AE      OT Treatment/Interventions: Self-care/ADL training;Therapeutic exercise;DME and/or AE instruction;Therapeutic activities;Balance training;Patient/family education    OT Goals(Current goals can be found in the care plan section) Acute Rehab OT Goals OT Goal Formulation: Patient unable to participate in goal setting Time For Goal Achievement: 08/30/21 Potential to Achieve Goals: Fair  OT Frequency: Min 2X/week   Barriers to D/C:            Co-evaluation              AM-PAC OT "6 Clicks" Daily Activity     Outcome Measure Help from another person eating meals?: A Little Help from another person taking care of personal grooming?: A Little Help from another person toileting, which includes using toliet, bedpan, or urinal?: A Lot Help from another person bathing (including washing, rinsing, drying)?: A Lot Help from another person to put on and taking off regular upper body clothing?: A Lot Help from another person to put on and taking off regular lower body clothing?: A Lot 6 Click Score: 14   End of Session Equipment Utilized During Treatment: Rolling walker;Gait belt Nurse Communication: Mobility status  Activity Tolerance: Patient tolerated treatment well Patient left: in chair;with call  bell/phone within reach;with chair alarm set  OT Visit Diagnosis: Unsteadiness on feet (R26.81);Other abnormalities of gait and mobility (R26.89);Hemiplegia and hemiparesis Hemiplegia - Right/Left: Left Hemiplegia - dominant/non-dominant: Dominant Hemiplegia - caused by: Cerebral infarction                Time: 4840-3979 OT Time Calculation (min): 21 min Charges:  OT General Charges $OT Visit: 1 Visit OT Evaluation $OT Eval Low Complexity: 1 Low  Jessee Mezera, OTR/L [place]  Office [phone] Pager: [place] 08/16/2021, 11:31 AM

## 2021-08-16 NOTE — TOC Initial Note (Signed)
Transition of Care [facility]) - Initial/Assessment Note    Patient Details  Name: Darren Pearson MRN: [id number] Date of Birth: 08-08-1966  Transition of Care [facility]) CM/SW Contact:    Lynnell Catalan, RN Phone Number: 08/16/2021, 3:31 PM  Clinical Narrative:                 Pt is from [facility] as a long term care resident. He can go back there at dc. FL2 completed and MD made aware that Covid test is needed prior to dc.  Expected Discharge Plan: Long Term Nursing Home Barriers to Discharge: Continued Medical Work up Expected Discharge Plan and Services Expected Discharge Plan: [place]   Discharge Planning Services: CM Consult   Living arrangements for the past 2 months: [place]                  Prior Living Arrangements/Services Living arrangements for the past 2 months: [place] Lives with:: Facility Resident           Activities of Daily Living Home Assistive Devices/Equipment: Environmental consultant (specify type) ADL Screening (condition at time of admission) Patient's cognitive ability adequate to safely complete daily activities?: No Is the patient deaf or have difficulty hearing?: No Does the patient have difficulty seeing, even when wearing glasses/contacts?: No Does the patient have difficulty concentrating, remembering, or making decisions?: Yes Patient able to express need for assistance with ADLs?: Yes Does the patient have difficulty dressing or bathing?: Yes Independently performs ADLs?: No Communication: Independent Dressing (OT): Needs assistance Is this a change from baseline?: Pre-admission baseline Grooming: Needs assistance Is this a change from baseline?: Pre-admission baseline Feeding: Independent Bathing: Needs assistance Is this a change from baseline?: Pre-admission baseline Toileting: Needs assistance (uses depends) Is this a change from baseline?: Pre-admission baseline In/Out Bed: Needs assistance Is this a  change from baseline?: Pre-admission baseline Walks in Home: Needs assistance Is this a change from baseline?: Pre-admission baseline Does the patient have difficulty walking or climbing stairs?: Yes Weakness of Legs: Left Weakness of Arms/Hands: Left  Permission Sought/Granted                  Emotional Assessment              Admission diagnosis:  Aspiration pneumonia ([place]) [J69.0] Hemoptysis [R04.2] Patient Active Problem List   Diagnosis Date Noted   Hemoptysis 08/15/2021   Aspiration pneumonia ([place]) 08/14/2021   HLD (hyperlipidemia) 08/14/2021   HTN (hypertension) 08/14/2021   Pneumonia 01/07/2021   Sepsis due to pneumonia ([place]) 01/06/2021   AKI (acute kidney injury) ([place]) 05/16/2020   CKD (chronic kidney disease), stage II 05/16/2020   Episode of unresponsiveness 05/09/2020   Symptomatic anemia 05/09/2020   History of CVA (cerebrovascular accident) 05/09/2020   Elevated troponin 05/09/2020   CVA (cerebral vascular accident) ([place]) 11/20/2019   Hypokalemia 11/20/2019   Accelerated hypertension 11/20/2019   Hyperammonemia ([place]) 11/20/2019   Elevated serum creatinine 11/20/2019   Elevated blood pressure 03/15/2015   Rash and nonspecific skin eruption 03/15/2015   Hepatitis C 04/12/2013   Allergic rhinitis 04/12/2013   Unspecified asthma(493.90) 04/12/2013   PCP:  Thompson Grayer, MD Pharmacy:   [facility] [place] [address] [place] [address] Phone: [phone] Fax: [phone]     Social Determinants of Health (SDOH) Interventions    Readmission Risk Interventions Readmission Risk Prevention Plan 01/07/2021  Transportation Screening Complete  Home Care Screening Complete  Medication Review (RN CM) Complete  Some recent data might be hidden

## 2021-08-16 NOTE — Progress Notes (Signed)
NAME:  Darren Pearson, MRN:  [id number], DOB:  1966-02-25, LOS: 1 ADMISSION DATE:  08/14/2021, CONSULTATION DATE: 08/15/2021 REFERRING MD: Dr. Karleen Hampshire, CHIEF COMPLAINT: Hemoptysis  History of Present Illness:  55 year old man, former smoker, with a history of poorly controlled hypertension, CVA's with associated mild dysphagia, dementia, hepatitis C, CKD stage II.  He resides in a skilled nursing facility due to his dementia.  History of right middle lobe pneumonia thought related to aspiration event in 12/2020.  Difficult historian but states that he began to experience cough about 2 days ago, then began to see hemoptysis.  Difficult to quantify but possibly some clots.  No witnessed aspiration event.  He also reports some epistaxis beginning yesterday, ? How this may be contributing. CT-PA performed and showed no PE but a focal posterior right middle lobe infiltrate with some distal central clearing, question abscess.  He was started on ceftriaxone and azithromycin.  PCCM consulted regarding the hemoptysis.   Pertinent  Medical History   Past Medical History:  Diagnosis Date   Allergy    Anemia    Anxiety and depression    Asthma    CVA (cerebral vascular accident) ([place])    Hepatitis C    History of chicken pox    Hypertension     Significant Hospital Events: Including procedures, antibiotic start and stop dates in addition to other pertinent events   CT-PA 08/14/2021 >> bilateral apical bullae, posterior right middle lobe consolidative change with some cystic central clearing more peripherally, more subtle patchy micronodular infiltrate in the right lower lobe, 2.5 cm right hilar lymphadenopathy  Interim History / Subjective:  Patient coughed up a small clot overnight last night, then another this morning.  No other blood noted. Nursing has not seen any evidence of epistaxis   Objective   Blood pressure (!) 151/106, pulse 82, temperature 97.7 F (36.5 C), temperature source Oral,  resp. rate 20, height 5\' 9"  (1.753 m), weight 69 kg, SpO2 100 %.    FiO2 (%):  [28 %] 28 %   Intake/Output Summary (Last 24 hours) at 08/16/2021 1134 Last data filed at 08/16/2021 1002 Gross per 24 hour  Intake 540 ml  Output 1300 ml  Net -760 ml   Filed Weights   08/14/21 1529 08/14/21 2245  Weight: 72.6 kg 69 kg    Examination: General: Comfortable, up to chair HENT: Oropharynx is clear.  No evidence of bleeding from the nares Lungs: Distant, no wheezes or crackles Cardiovascular: Regular, no murmur Abdomen: Obese, soft, nondistended, positive bowel sounds Extremities: Trace pretibial edema Neuro: Awake.  Will nod to questions but does not interact further.  Does not verbally interact today.  Resolved Hospital Problem list     Assessment & Plan:  Hemoptysis, small-volume, in the setting of right middle lobe infiltrate/opacity.  Difficult to say whether the infiltrate reflects a new pneumonia versus rounded atelectasis from his right middle lobe pneumonia in February 2022.  There is lateral central clearing and cystic change which would suggest an evolving process but absence of leukocytosis argues against a new pneumonia.  I am concerned about possible parenchymal necrosis with associated bleeding.  Must also consider malignancy. He had initially describes some epistaxis but no evidence on exam and in retrospect I do not think his history is reliable due to his dementia.  -Suspected source of bleeding is the right middle lobe so unclear that inspection bronchoscopy to localize is necessary.  No real therapeutic benefit to bronchoscopy at this juncture.  If his hemoptysis increases it would be reasonable to consider an airway inspection to look for endobronchial lesion although none seen on CT. -Little clinically to support a recurrent aspiration pneumonia, but agree with treating with Unasyn and watching to see if there is any improvement, resolution of his hemoptysis since this is  the most conservative and least invasive approach.  If persists despite treatment then would consider bronchoscopy with transbronchial biopsies, possibly after PET scan if able to arrange as an outpatient -If his hemoptysis does resolve then he will need a repeat CT chest in about 6 weeks to look for improvement, evolution of his right middle lobe opacity and cavitary lesion.  As above he may still need PET scan, bronchoscopy to evaluate further. -If we do see evidence for epistaxis then we will consider ENT evaluation.  I try to get him to blow his nose today but he could not comply due to understanding.  I do not see any evidence of nosebleeding. -Swallowing precautions -Continue off anticoagulation -Tight blood pressure control -Cough suppression would be beneficial.  Lisinopril was changed to scheduled hydralazine.   Best Practice (right click and "Reselect all SmartList Selections" daily)  Per TRH plans  Labs   CBC: Recent Labs  Lab 08/14/21 1642 08/15/21 0523  WBC 7.5 5.8  NEUTROABS 4.6  --   HGB 11.9* 11.7*  HCT 36.6* 35.7*  MCV 89.1 88.4  PLT 249 813    Basic Metabolic Panel: Recent Labs  Lab 08/14/21 1642  NA 138  K 3.5  CL 103  CO2 25  GLUCOSE 88  BUN 19  CREATININE 0.93  CALCIUM 8.9   GFR: Estimated Creatinine Clearance: 86.6 mL/min (by C-G formula based on SCr of 0.93 mg/dL). Recent Labs  Lab 08/14/21 1642 08/15/21 0523  WBC 7.5 5.8    Liver Function Tests: Recent Labs  Lab 08/14/21 1642  AST 14*  ALT 10  ALKPHOS 96  BILITOT 1.3*  PROT 8.4*  ALBUMIN 3.6   No results for input(s): LIPASE, AMYLASE in the last 168 hours. No results for input(s): AMMONIA in the last 168 hours.  ABG    Component Value Date/Time   TCO2 21 (L) 05/09/2020 1222     Coagulation Profile: Recent Labs  Lab 08/14/21 1642  INR 1.1      Critical care time: NA     Baltazar Apo, MD, PhD 08/16/2021, 11:34 AM [facility] [phone]  or if no answer before 7:00PM call [phone] For any issues after 7:00PM please call eLink [phone]

## 2021-08-16 NOTE — Progress Notes (Signed)
PCCM Interval Note  Notified by the patient's RN that Darren Pearson had an episode of clear epistaxis with some clots, associated with coughing some of the same material.  About 4 cloths used to clear and clean up.  He had mentioned epistaxis to me on my initial evaluation but his history giving was suspect.  Could be that his blood is principally nasal or sinus and that the right middle opacity represents rounded atelectasis from his prior severe right middle lobe pneumonia.  I think will be reasonable to have an ENT evaluation to look for a lesion that could be treated, cauterize, etc.  We will continue to follow with regard to the bleeding and also his abnormal CT scan of the chest.  Baltazar Apo, MD, PhD 08/16/2021, 4:17 PM [facility] [phone] or if no answer before 7:00PM call [phone] For any issues after 7:00PM please call eLink [phone]

## 2021-08-16 NOTE — Evaluation (Signed)
Clinical/Bedside Swallow Evaluation Patient Details  Name: Darren Pearson MRN: [id number] Date of Birth: 05-15-1966  Today's Date: 08/16/2021 Time: SLP Start Time (ACUTE ONLY): 8469 SLP Stop Time (ACUTE ONLY): 6295 SLP Time Calculation (min) (ACUTE ONLY): 8 min  Past Medical History:  Past Medical History:  Diagnosis Date   Allergy    Anemia    Anxiety and depression    Asthma    CVA (cerebral vascular accident) ([place])    Hepatitis C    History of chicken pox    Hypertension    Past Surgical History:  Past Surgical History:  Procedure Laterality Date   BUBBLE STUDY  11/22/2019   Procedure: BUBBLE STUDY;  Surgeon: Dorothy Spark, MD;  Location: [place];  Service: Cardiovascular;;   LOOP RECORDER INSERTION N/A 11/22/2019   Procedure: LOOP RECORDER INSERTION;  Surgeon: Thompson Grayer, MD;  Location: [place];  Service: Cardiovascular;  Laterality: N/A;   TEE WITHOUT CARDIOVERSION N/A 11/22/2019   Procedure: TRANSESOPHAGEAL ECHOCARDIOGRAM (TEE);  Surgeon: Dorothy Spark, MD;  Location: [facility] ENDOSCOPY;  Service: Cardiovascular;  Laterality: N/A;   HPI:  55 year old man admitted with Hemoptysis, small-volume, in the setting of right middle lobe infiltrate.  Per pulmonology, "difficult to say whether the infiltrate reflects a new pneumonia versus rounded atelectasis from his right middle lobe pneumonia in February 2022.  Currently there is lateral central clearing and cystic change which would suggest an evolving process although absence of leukocytosis would argue against a new pneumonia." Pt is a former smoker, with a history of poorly controlled hypertension, CVA's with associated mild dysphagia, dementia, hepatitis C, CKD stage II.  He resides in a skilled nursing facility due to his dementia. On prior admission with pna, pt had MBS showing mild dyspahgia, with trace residue that cleared, a small outpouching which would intermittently collect trace amount of liquids,  though this would fully clear and did not appear to be significantly impacting swallow. Pt recommended to consume regular thin. Prior MBS in 2021 with similar findings.    Assessment / Plan / Recommendation  Clinical Impression  Pt demonstrates no acute finding indicating change or worsening of function. He is upright feeding himself a lunch meal; regular textures and thin liquids. Pt has had multiple MBS in recent past to address concerns of pna and potential for aspiration as a cause, but no significant clinical or instrumental findings have been found. No recommendation for repeat MBS at this time in my opintion but if desired by MD we can repeat. Will sign off an address any new orders as they arise. SLP Visit Diagnosis: Dysphagia, unspecified (R13.10)    Aspiration Risk  Mild aspiration risk    Diet Recommendation Regular;Thin liquid   Liquid Administration via: Cup;Straw Medication Administration: Whole meds with liquid Supervision: Patient able to self feed    Other  Recommendations      Recommendations for follow up therapy are one component of a multi-disciplinary discharge planning process, led by the attending physician.  Recommendations may be updated based on patient status, additional functional criteria and insurance authorization.  Follow up Recommendations        Frequency and Duration            Prognosis        Swallow Study   General HPI: 55 year old man admitted with Hemoptysis, small-volume, in the setting of right middle lobe infiltrate.  Per pulmonology, "difficult to say whether the infiltrate reflects a new pneumonia versus rounded atelectasis from  his right middle lobe pneumonia in February 2022.  Currently there is lateral central clearing and cystic change which would suggest an evolving process although absence of leukocytosis would argue against a new pneumonia." Pt is a former smoker, with a history of poorly controlled hypertension, CVA's with  associated mild dysphagia, dementia, hepatitis C, CKD stage II.  He resides in a skilled nursing facility due to his dementia. On prior admission with pna, pt had MBS showing mild dyspahgia, with trace residue that cleared, a small outpouching which would intermittently collect trace amount of liquids, though this would fully clear and did not appear to be significantly impacting swallow. Pt recommended to consume regular thin. Prior MBS in 2021 with similar findings. Type of Study: Bedside Swallow Evaluation Diet Prior to this Study: Regular;Thin liquids Temperature Spikes Noted: No Respiratory Status: Room air History of Recent Intubation: No Behavior/Cognition: Alert;Cooperative;Pleasant mood Oral Cavity Assessment: Within Functional Limits Oral Care Completed by SLP: No Oral Cavity - Dentition: Adequate natural dentition Self-Feeding Abilities: Able to feed self Patient Positioning: Upright in chair Baseline Vocal Quality: Normal Volitional Cough: Strong Volitional Swallow: Able to elicit    Oral/Motor/Sensory Function Overall Oral Motor/Sensory Function: Within functional limits   Ice Chips     Thin Liquid Thin Liquid: Within functional limits Presentation: Straw;Self Fed    Nectar Thick Nectar Thick Liquid: Not tested   Honey Thick Honey Thick Liquid: Not tested   Puree Puree: Within functional limits   Solid     Solid: Within functional limits      Darren Pearson, Darren Pearson 08/16/2021,1:06 PM

## 2021-08-16 NOTE — Progress Notes (Signed)
Pt. Was up walking with PT when he began sneezing , his nose was bleeding and he was also cough blood. Nurse and PT. Went through about 4 wash cloths of blood. Pt. HR went up. Nurse and PT got pt. Back to bed and took vitals. Pt. Was not lightheaded of have any headache. Provider notified, will continue to monitor.

## 2021-08-16 NOTE — Progress Notes (Signed)
Pt. Sister brought clothes for pt. To wear home when D/C. Clothes are in pt. Closet.

## 2021-08-16 NOTE — Progress Notes (Signed)
PROGRESS NOTE    JUANDIEGO KOLENOVIC  OAC:[id number] DOB: 18-Mar-1966 DOA: 08/14/2021 PCP: Thompson Grayer, MD    Chief Complaint  Patient presents with   Hemoptysis    Brief Narrative:  55 year old gentleman with prior history of dementia, CVA, hypertension, asthma, stage II CKD, hepatitis C was brought into ED for evaluation of hemoptysis.  Sister over the phone reports that patient had a severe episode of coughing and had phlegm with some blood in it.  Patient at bedside appears comfortable, currently on 2 L of nasal cannula oxygen to keep sats greater than 90%. He was admitted for evaluation and management of aspiration pneumonia. CT angiogram of the chest shows pneumonia in the right middle and right lower lobe concerning for aspiration  Assessment & Plan:   Active Problems:   History of CVA (cerebrovascular accident)   Sepsis due to pneumonia ([place])   Aspiration pneumonia ([place])   HLD (hyperlipidemia)   HTN (hypertension)   Hemoptysis   Sepsis secondary to right middle and right lower lobe pneumonia CT of the chest concerning for aspiration pneumonia.  Recommend repeat imaging following resolution of symptoms given underlying mass not excluded on CT. Initially started on IV Rocephin and Zithromax and transitioned to Unasyn. Strep pneumonia is negative, COVID 19 negative. Blood cultures not done.  Legionella antigen is pending Sputum culture pending Nasal cannula oxygen to keep sats greater than 90% SLP evaluation to evaluate for aspiration. Therapy evaluations ordered.  Hemoptysis:  Recurrent, last episode yesterday afternoon. None ths am.  PCCM consulted, for evaluation of bronchoscopy.  Recommended outpatient follow up CT / PET scan for further evaluation.  Discontinued lovenox and aspirin.  SLP evaluation.    Essential hypertension  Suboptimally controlled.  Added hydralazine and amlodipine.    History of CVA Aspirin held for hemoptysis  Therapy evaluations ordered  for SNF.    History of dementia no behavioral abnormalities Patient oriented to self and place only.   Mild anemia/anemia of chronic disease/normocytic anemia Patient's hemoglobin is better than in February 2022 Continue to monitor    DVT prophylaxis: Lovenox Code Status: Full code Family Communication: Discussed with sister over the phone Disposition:   Status is: Observation  The patient will require care spanning > 2 midnights and should be moved to inpatient because: Ongoing diagnostic testing needed not appropriate for outpatient work up, Unsafe d/c plan, and IV treatments appropriate due to intensity of illness or inability to take PO  Dispo: The patient is from: SNF              Anticipated d/c is to: SNF              Patient currently is not medically stable to d/c.   Difficult to place patient No       Consultants:  None.   Procedures: none.  Antimicrobials:  Antibiotics Given (last 72 hours)     Date/Time Action Medication Dose Rate   08/14/21 2110 New Bag/Given   cefTRIAXone (ROCEPHIN) 1 g in sodium chloride 0.9 % 100 mL IVPB 1 g 200 mL/hr   08/14/21 2147 New Bag/Given   azithromycin (ZITHROMAX) 500 mg in sodium chloride 0.9 % 250 mL IVPB 500 mg 250 mL/hr   08/15/21 1417 New Bag/Given   Ampicillin-Sulbactam (UNASYN) 3 g in sodium chloride 0.9 % 100 mL IVPB 3 g 200 mL/hr   08/15/21 1721 New Bag/Given   Ampicillin-Sulbactam (UNASYN) 3 g in sodium chloride 0.9 % 100 mL IVPB 3 g 200 mL/hr  08/16/21 0026 New Bag/Given   Ampicillin-Sulbactam (UNASYN) 3 g in sodium chloride 0.9 % 100 mL IVPB 3 g 200 mL/hr   08/16/21 1610 New Bag/Given   Ampicillin-Sulbactam (UNASYN) 3 g in sodium chloride 0.9 % 100 mL IVPB 3 g 200 mL/hr        Subjective: Last hemoptysis yesterday evening.   Objective: Vitals:   08/15/21 1421 08/15/21 1549 08/15/21 2043 08/16/21 0442  BP: (!) 155/104  (!) 167/108 (!) 151/106  Pulse: (!) 106  83 82  Resp: 19  19 20   Temp: 98.9 F  (37.2 C)  (!) 97.4 F (36.3 C) 97.7 F (36.5 C)  TempSrc: Oral  Oral Oral  SpO2: 94% 98% 100% 100%  Weight:      Height:        Intake/Output Summary (Last 24 hours) at 08/16/2021 1254 Last data filed at 08/16/2021 1002 Gross per 24 hour  Intake 540 ml  Output 1300 ml  Net -760 ml    Filed Weights   08/14/21 1529 08/14/21 2245  Weight: 72.6 kg 69 kg    Examination:  General exam: Appears calm and comfortable  Respiratory system: Clear to auscultation. Respiratory effort normal. Cardiovascular system: S1 & S2 heard, RRR. No JVD, o pedal edema. Gastrointestinal system: Abdomen is nondistended, soft and nontender.  Normal bowel sounds heard. Central nervous system: Alert and oriented to self and place. Grossly non focal.  Extremities: Symmetric 5 x 5 power. Skin: No rashes, lesions or ulcers Psychiatry: Mood & affect appropriate.       Data Reviewed: I have personally reviewed following labs and imaging studies  CBC: Recent Labs  Lab 08/14/21 1642 08/15/21 0523  WBC 7.5 5.8  NEUTROABS 4.6  --   HGB 11.9* 11.7*  HCT 36.6* 35.7*  MCV 89.1 88.4  PLT 249 241     Basic Metabolic Panel: Recent Labs  Lab 08/14/21 1642  NA 138  K 3.5  CL 103  CO2 25  GLUCOSE 88  BUN 19  CREATININE 0.93  CALCIUM 8.9     GFR: Estimated Creatinine Clearance: 86.6 mL/min (by C-G formula based on SCr of 0.93 mg/dL).  Liver Function Tests: Recent Labs  Lab 08/14/21 1642  AST 14*  ALT 10  ALKPHOS 96  BILITOT 1.3*  PROT 8.4*  ALBUMIN 3.6     CBG: No results for input(s): GLUCAP in the last 168 hours.   Recent Results (from the past 240 hour(s))  SARS CORONAVIRUS 2 (TAT 6-24 HRS) Nasopharyngeal Nasopharyngeal Swab     Status: None   Collection Time: 08/14/21  9:17 PM   Specimen: Nasopharyngeal Swab  Result Value Ref Range Status   SARS Coronavirus 2 NEGATIVE NEGATIVE Final    Comment: (NOTE) SARS-CoV-2 target nucleic acids are NOT DETECTED.  The SARS-CoV-2  RNA is generally detectable in upper and lower respiratory specimens during the acute phase of infection. Negative results do not preclude SARS-CoV-2 infection, do not rule out co-infections with other pathogens, and should not be used as the sole basis for treatment or other patient management decisions. Negative results must be combined with clinical observations, patient history, and epidemiological information. The expected result is Negative.  Fact Sheet for Patients: SugarRoll.be  Fact Sheet for Healthcare Providers: https://www.woods-mathews.com/  This test is not yet approved or cleared by the [place] FDA and  has been authorized for detection and/or diagnosis of SARS-CoV-2 by FDA under an Emergency Use Authorization (EUA). This EUA will remain  in effect (meaning  this test can be used) for the duration of the COVID-19 declaration under Se ction 564(b)(1) of the Act, 21 U.S.C. section 360bbb-3(b)(1), unless the authorization is terminated or revoked sooner.  Performed at [facility], [place] [address], [place], [place] [address]   Expectorated Sputum Assessment w Gram Stain, Rflx to Resp Cult     Status: None   Collection Time: 08/15/21  6:47 AM   Specimen: Expectorated Sputum  Result Value Ref Range Status   Specimen Description EXPECTORATED SPUTUM  Final   Special Requests NONE  Final   Sputum evaluation   Final    THIS SPECIMEN IS ACCEPTABLE FOR SPUTUM CULTURE Performed at [facility], [place] [address] [place], [place] [address]    Report Status 08/15/2021 FINAL  Final  Culture, Respiratory w Gram Stain     Status: None (Preliminary result)   Collection Time: 08/15/21  6:47 AM  Result Value Ref Range Status   Specimen Description   Final    EXPECTORATED SPUTUM Performed at [place] [address] [place], [place] [address]    Special Requests   Final    NONE Reflexed  from [id number] Performed at [facility], [place] [address], [place], [place] [address]    Gram Stain   Final    RARE WBC PRESENT, PREDOMINANTLY MONONUCLEAR RARE GRAM POSITIVE COCCI RARE GRAM NEGATIVE RODS    Culture   Final    CULTURE REINCUBATED FOR BETTER GROWTH Performed at [facility], [place] [address], [place], [place] [address]    Report Status PENDING  Incomplete          Radiology Studies: CT Angio Chest PE W and/or Wo Contrast  Result Date: 08/14/2021 CLINICAL DATA:  Hemoptysis. EXAM: CT ANGIOGRAPHY CHEST WITH CONTRAST TECHNIQUE: Multidetector CT imaging of the chest was performed using the standard protocol during bolus administration of intravenous contrast. Multiplanar CT image reconstructions and MIPs were obtained to evaluate the vascular anatomy. CONTRAST:  156mL OMNIPAQUE IOHEXOL 350 MG/ML SOLN COMPARISON:  Chest radiograph dated 01/08/2021. FINDINGS: Cardiovascular: No cardiomegaly or pericardial effusion. The thoracic aorta is unremarkable. Evaluation of the pulmonary arteries is very limited due to respiratory motion artifact and suboptimal opacification and timing of the contrast. No large or central pulmonary artery embolus identified. Mediastinum/Nodes: Right hilar adenopathy measures 2.5 x 2.4 cm. The esophagus is grossly unremarkable. No mediastinal fluid collection. Lungs/Pleura: Background of centrilobular emphysema with large biapical subpleural blebs. There is consolidative changes of the right middle lobe with volume loss and areas of small cystic changes. Although findings may represent atelectasis or pneumonia underlying mass is not excluded. Follow-up to resolution is recommended. There is patchy area of nodular density in the right lower lobe consistent with pneumonia. Aspiration is not excluded. Mucus secretions noted in the trachea and mainstem bronchi. The central airways remain patent. No pleural effusion or pneumothorax. Upper Abdomen:  No acute abnormality. Musculoskeletal: No chest wall abnormality. No acute or significant osseous findings. Review of the MIP images confirms the above findings. IMPRESSION: 1. No CT evidence of central pulmonary artery embolus. 2. Right middle lobe atelectasis or pneumonia. Underlying mass is not excluded. Follow-up to resolution is recommended. 3. Patchy area of nodular density in the right lower lobe consistent with pneumonia. Aspiration is not excluded. 4. Right hilar adenopathy, likely reactive. 5. Emphysema (ICD10-J43.9). Electronically Signed   By: Anner Crete M.D.   On: 08/14/2021 19:59  Scheduled Meds:  atorvastatin  40 mg Oral q1800   ferrous sulfate  325 mg Oral Q breakfast   hydrALAZINE  25 mg Oral Q8H   sertraline  50 mg Oral Daily   cyanocobalamin  1,000 mcg Oral Daily   Continuous Infusions:  ampicillin-sulbactam (UNASYN) IV 3 g (08/16/21 0624)     LOS: 1 day        Hosie Poisson, MD [facility]   To contact the attending provider between 7A-7P or the covering provider during after hours 7P-7A, please log into the web site [email or web address] and access using universal [place] password for that web site. If you do not have the password, please call the hospital operator.  08/16/2021, 12:54 PM

## 2021-08-16 NOTE — Evaluation (Signed)
Physical Therapy Evaluation Patient Details Name: UNO ESAU MRN: [id number] DOB: 09-Apr-1966 Today's Date: 08/16/2021  History of Present Illness  Pt is 55 year old man admitted on 08/14/21 with hemoptysis in the setting of right middle lobe pneumonia.  Pt with hx including former smoker, poorly controlled hypertension, CVA's with associated mild dysphagia and left sided weakness, dementia, hepatitis C, CKD stage II.  He resides in a skilled nursing facility due to his dementia.  Clinical Impression  Pt admitted with above diagnosis. At baseline, pt was from nursing home where he was ambulatory with RW (on his own).  Today, he required mod A for transfers due to posterior lean but then was able to ambulate 60'.  He was able to ambulate on RA with sats 97%.  During ambulation, pt began continuously sneezing with nose bleeding and hemoptysis - RN in to assist.   Pt currently with functional limitations due to the deficits listed below (see PT Problem List). Pt will benefit from skilled PT to increase their independence and safety with mobility to allow discharge to the venue listed below.          Recommendations for follow up therapy are one component of a multi-disciplinary discharge planning process, led by the attending physician.  Recommendations may be updated based on patient status, additional functional criteria and insurance authorization.  Follow Up Recommendations SNF    Equipment Recommendations  None recommended by PT    Recommendations for Other Services       Precautions / Restrictions Precautions Precautions: Fall;Other (comment) Precaution Comments: continues to have bloody hemoptysis      Mobility  Bed Mobility               General bed mobility comments: in chair    Transfers Overall transfer level: Needs assistance Equipment used: Rolling walker (2 wheeled) Transfers: Sit to/from Stand Sit to Stand: Mod assist         General transfer comment:  Cues for hand placement and forward momentum.  Tending to posterior lean requiring tactile cues and verbal cues for weight on toes to correct  Ambulation/Gait Ambulation/Gait assistance: Min assist Gait Distance (Feet): 60 Feet Assistive device: Rolling walker (2 wheeled) Gait Pattern/deviations: Step-to pattern;Decreased stride length;Trunk flexed;Shuffle Gait velocity: decreased   General Gait Details: Slow gait with assist for RW to propel forward at times and turn when needed.  Limited distance due to pt began continuously sneezing with nose bleed and hemoptysis  Stairs            Wheelchair Mobility    Modified Rankin (Stroke Patients Only)       Balance Overall balance assessment: Needs assistance Sitting-balance support: No upper extremity supported Sitting balance-Leahy Scale: Good     Standing balance support: Bilateral upper extremity supported Standing balance-Leahy Scale: Poor Standing balance comment: Posteior lean requiring mod A intially improving to min guard                             Pertinent Vitals/Pain Pain Assessment: No/denies pain    Home Living Family/patient expects to be discharged to:: Skilled nursing facility                 Additional Comments: Pt is a resident of SNF - [facility]    Prior Function Level of Independence: Needs assistance   Gait / Transfers Assistance Needed: Walks with RW without assist from staff; 2 falls last month but otherwise  hasn't had falls  ADL's / Homemaking Assistance Needed: Has assist with all ADLs        Hand Dominance   Dominant Hand: Left    Extremity/Trunk Assessment   Upper Extremity Assessment Upper Extremity Assessment: Defer to OT evaluation    Lower Extremity Assessment Lower Extremity Assessment: Generalized weakness;Difficult to assess due to impaired cognition (ROM WFL (tight knee ext but able to get straight).  MMT at least 3/5 but not following further  commands)    Cervical / Trunk Assessment Cervical / Trunk Assessment: Normal  Communication   Communication: No difficulties  Cognition Arousal/Alertness: Awake/alert Behavior During Therapy: WFL for tasks assessed/performed Overall Cognitive Status: History of cognitive impairments - at baseline                                 General Comments: Hx of dementia. Alert to self and knows he is at [facility].  Sister present and confirmed hx that OT had earlier. Very little vocalization from pt      General Comments General comments (skin integrity, edema, etc.): Pt was on 2 L O2 with sats 100%.  Tried RA and maintained 97% rest and activity. HR up to 135 bpm with walking and sneezing. With walking pt began continuously sneezing with nose bleed and hemoptysis.  RN assisting and returned pt to room.  Sneezing stopped.  Positioned in chair.  RN with pt when PT left.    Exercises     Assessment/Plan    PT Assessment Patient needs continued PT services  PT Problem List Decreased strength;Decreased mobility;Decreased safety awareness;Decreased range of motion;Decreased coordination;Decreased activity tolerance;Decreased cognition;Decreased balance;Decreased knowledge of use of DME       PT Treatment Interventions DME instruction;Therapeutic activities;Gait training;Therapeutic exercise;Patient/family education;Balance training;Functional mobility training    PT Goals (Current goals can be found in the Care Plan section)  Acute Rehab PT Goals Patient Stated Goal: plan to return to SNF PT Goal Formulation: With patient/family Time For Goal Achievement: 08/30/21 Potential to Achieve Goals: Good    Frequency Min 2X/week   Barriers to discharge        Co-evaluation               AM-PAC PT "6 Clicks" Mobility  Outcome Measure Help needed turning from your back to your side while in a flat bed without using bedrails?: A Little Help needed moving from lying on  your back to sitting on the side of a flat bed without using bedrails?: A Little Help needed moving to and from a bed to a chair (including a wheelchair)?: A Lot Help needed standing up from a chair using your arms (e.g., wheelchair or bedside chair)?: A Lot Help needed to walk in hospital room?: A Little Help needed climbing 3-5 steps with a railing? : Total 6 Click Score: 14    End of Session Equipment Utilized During Treatment: Gait belt Activity Tolerance: Patient tolerated treatment well Patient left: with chair alarm set;in chair;with call bell/phone within reach;with family/visitor present;with nursing/sitter in room Nurse Communication: Mobility status PT Visit Diagnosis: Unsteadiness on feet (R26.81);Muscle weakness (generalized) (M62.81)    Time: 7412-8786 PT Time Calculation (min) (ACUTE ONLY): 24 min   Charges:   PT Evaluation $PT Eval Moderate Complexity: 1 Mod PT Treatments $Gait Training: 8-22 mins        Abran Richard, PT Acute Rehab Services Pager [phone] [facility]  H Jahmeek Shirk 08/16/2021, 3:52 PM

## 2021-08-16 NOTE — NC FL2 (Signed)
[place] LEVEL OF CARE SCREENING TOOL     IDENTIFICATION  Patient Name: Darren Pearson Birthdate: 03-Mar-1966 Sex: male Admission Date (Current Location): 08/14/2021  [facility] and [place] Number:  Herbalist and Address:  [facility],  [place], [place]      Provider Number: [id number]  Attending Physician Name and Address:  Hosie Poisson, MD  Relative Name and Phone Number:       Current Level of Care: Hospital Recommended Level of Care: [place] Prior Approval Number:    Date Approved/Denied:   PASRR Number:    Discharge Plan: SNF    Current Diagnoses: Patient Active Problem List   Diagnosis Date Noted   Hemoptysis 08/15/2021   Aspiration pneumonia ([place]) 08/14/2021   HLD (hyperlipidemia) 08/14/2021   HTN (hypertension) 08/14/2021   Pneumonia 01/07/2021   Sepsis due to pneumonia ([place]) 01/06/2021   AKI (acute kidney injury) ([place]) 05/16/2020   CKD (chronic kidney disease), stage II 05/16/2020   Episode of unresponsiveness 05/09/2020   Symptomatic anemia 05/09/2020   History of CVA (cerebrovascular accident) 05/09/2020   Elevated troponin 05/09/2020   CVA (cerebral vascular accident) ([place]) 11/20/2019   Hypokalemia 11/20/2019   Accelerated hypertension 11/20/2019   Hyperammonemia ([place]) 11/20/2019   Elevated serum creatinine 11/20/2019   Elevated blood pressure 03/15/2015   Rash and nonspecific skin eruption 03/15/2015   Hepatitis C 04/12/2013   Allergic rhinitis 04/12/2013   Unspecified asthma(493.90) 04/12/2013    Orientation RESPIRATION BLADDER Height & Weight     Self  Normal Continent Weight: 69 kg Height:  5\' 9"  (175.3 cm)  BEHAVIORAL SYMPTOMS/MOOD NEUROLOGICAL BOWEL NUTRITION STATUS      Incontinent Diet (Heart Healthy)  AMBULATORY STATUS COMMUNICATION OF NEEDS Skin   Limited Assist Verbally Normal                       Personal Care Assistance Level of Assistance   Bathing, Feeding, Dressing Bathing Assistance: Limited assistance Feeding assistance: Limited assistance Dressing Assistance: Limited assistance     Functional Limitations Info  Hearing, Speech, Sight Sight Info: Impaired Hearing Info: Impaired Speech Info: Adequate    SPECIAL CARE FACTORS FREQUENCY                       Contractures      Additional Factors Info  Code Status, Allergies Code Status Info: Full Allergies Info: Sulfa Antibiotics           Current Medications (08/16/2021):  This is the current hospital active medication list Current Facility-Administered Medications  Medication Dose Route Frequency Provider Last Rate Last Admin   amLODipine (NORVASC) tablet 5 mg  5 mg Oral Daily Hosie Poisson, MD   5 mg at 08/16/21 1359   Ampicillin-Sulbactam (UNASYN) 3 g in sodium chloride 0.9 % 100 mL IVPB  3 g Intravenous Q6H Shade, Christine E, RPH 200 mL/hr at 08/16/21 1400 3 g at 08/16/21 1400   atorvastatin (LIPITOR) tablet 40 mg  40 mg Oral q1800 Tu, Ching T, DO   40 mg at 08/15/21 1720   ferrous sulfate tablet 325 mg  325 mg Oral Q breakfast Tu, Ching T, DO   325 mg at 08/16/21 0951   guaiFENesin-dextromethorphan (ROBITUSSIN DM) 100-10 MG/5ML syrup 10 mL  10 mL Oral Q4H PRN Hosie Poisson, MD       hydrALAZINE (APRESOLINE) tablet 25 mg  25 mg Oral Q8H Akula, Vijaya,  MD   25 mg at 08/16/21 1359   labetalol (NORMODYNE) injection 5 mg  5 mg Intravenous Q2H PRN Tu, Ching T, DO   5 mg at 08/15/21 5749   sertraline (ZOLOFT) tablet 50 mg  50 mg Oral Daily Tu, Ching T, DO   50 mg at 08/16/21 3552   vitamin B-12 (CYANOCOBALAMIN) tablet 1,000 mcg  1,000 mcg Oral Daily Tu, Ching T, DO   1,000 mcg at 08/16/21 1747     Discharge Medications: Please see discharge summary for a list of discharge medications.  Relevant Imaging Results:  Relevant Lab Results:   Additional Information SSN [id number]  Gwenyth Dingee, Marjie Skiff, RN

## 2021-08-17 DIAGNOSIS — R042 Hemoptysis: Secondary | ICD-10-CM | POA: Diagnosis not present

## 2021-08-17 DIAGNOSIS — J690 Pneumonitis due to inhalation of food and vomit: Secondary | ICD-10-CM

## 2021-08-17 LAB — CULTURE, RESPIRATORY W GRAM STAIN: Culture: NORMAL

## 2021-08-17 LAB — CBC
HCT: 33.2 % — ABNORMAL LOW (ref 39.0–52.0)
Hemoglobin: 11.2 g/dL — ABNORMAL LOW (ref 13.0–17.0)
MCH: 29.5 pg (ref 26.0–34.0)
MCHC: 33.7 g/dL (ref 30.0–36.0)
MCV: 87.4 fL (ref 80.0–100.0)
Platelets: 252 10*3/uL (ref 150–400)
RBC: 3.8 MIL/uL — ABNORMAL LOW (ref 4.22–5.81)
RDW: 13.3 % (ref 11.5–15.5)
WBC: 5.8 10*3/uL (ref 4.0–10.5)
nRBC: 0 % (ref 0.0–0.2)

## 2021-08-17 LAB — BASIC METABOLIC PANEL
Anion gap: 10 (ref 5–15)
BUN: 13 mg/dL (ref 6–20)
CO2: 23 mmol/L (ref 22–32)
Calcium: 8.8 mg/dL — ABNORMAL LOW (ref 8.9–10.3)
Chloride: 104 mmol/L (ref 98–111)
Creatinine, Ser: 0.78 mg/dL (ref 0.61–1.24)
GFR, Estimated: >60 mL/min (ref >60)
Glucose, Bld: 90 mg/dL (ref 70–99)
Potassium: 3.4 mmol/L — ABNORMAL LOW (ref 3.5–5.1)
Sodium: 137 mmol/L (ref 135–145)

## 2021-08-17 MED ORDER — CLONIDINE HCL 0.1 MG PO TABS
0.1000 mg | ORAL_TABLET | Freq: Three times a day (TID) | ORAL | Status: DC
  Administered 2021-08-17 – 2021-08-18 (×6): 0.1 mg via ORAL
  Filled 2021-08-17 (×6): qty 1

## 2021-08-17 MED ORDER — AMOXICILLIN-POT CLAVULANATE 875-125 MG PO TABS
1.0000 | ORAL_TABLET | Freq: Two times a day (BID) | ORAL | Status: DC
  Administered 2021-08-17 – 2021-08-19 (×5): 1 via ORAL
  Filled 2021-08-17 (×5): qty 1

## 2021-08-17 MED ORDER — POTASSIUM CHLORIDE CRYS ER 20 MEQ PO TBCR
40.0000 meq | EXTENDED_RELEASE_TABLET | Freq: Once | ORAL | Status: AC
  Administered 2021-08-17: 40 meq via ORAL
  Filled 2021-08-17: qty 2

## 2021-08-17 NOTE — Progress Notes (Addendum)
NAME:  Darren Pearson, MRN:  [id number], DOB:  12-30-1965, LOS: 2 ADMISSION DATE:  08/14/2021, CONSULTATION DATE: 08/15/2021 REFERRING MD: Dr. Karleen Hampshire, CHIEF COMPLAINT: Hemoptysis  History of Present Illness:  55 year old man, former smoker, with a history of poorly controlled hypertension, CVA's with associated mild dysphagia, dementia, hepatitis C, CKD stage II.  He resides in a skilled nursing facility due to his dementia.  History of right middle lobe pneumonia thought related to aspiration event in 12/2020.  Difficult historian but states that he began to experience cough about 2 days PTA then began to see hemoptysis.  Difficult to quantify but possibly some clots.  No witnessed aspiration event.  He also reports some epistaxis beginning one day PTA, ? How this may be contributing. CT-PA performed and showed no PE but a focal posterior right middle lobe infiltrate with some distal central clearing, question abscess.  He was started on ceftriaxone and azithromycin.  PCCM consulted regarding the hemoptysis.   Pertinent  Medical History   Past Medical History:  Diagnosis Date   Allergy    Anemia    Anxiety and depression    Asthma    CVA (cerebral vascular accident) ([place])    Hepatitis C    History of chicken pox    Hypertension     Significant Hospital Events: Including procedures, antibiotic start and stop dates in addition to other pertinent events   CT-PA 08/14/2021 >> bilateral apical bullae, posterior right middle lobe consolidative change with some cystic central clearing more peripherally, more subtle patchy micronodular infiltrate in the right lower lobe, 2.5 cm right hilar lymphadenopathy   Scheduled Meds:  amLODipine  5 mg Oral Daily   amoxicillin-clavulanate  1 tablet Oral Q12H   atorvastatin  40 mg Oral q1800   ferrous sulfate  325 mg Oral Q breakfast   hydrALAZINE  25 mg Oral Q8H   loratadine  10 mg Oral Daily   oxymetazoline  1 spray Each Nare BID   potassium chloride   40 mEq Oral Once   sertraline  50 mg Oral Daily   sodium chloride  1 spray Each Nare Q2H   cyanocobalamin  1,000 mcg Oral Daily   Continuous Infusions: PRN Meds:.diphenhydrAMINE, guaiFENesin-codeine, labetalol    Interim History / Subjective:   poor recall of overnight events/ epistaxis and hemoptysis both reported  No sob or desats    Objective   Blood pressure (!) 157/90, pulse 95, temperature 98.9 F (37.2 C), temperature source Oral, resp. rate 19, height 5\' 9"  (1.753 m), weight 69 kg, SpO2 97 %.        Intake/Output Summary (Last 24 hours) at 08/17/2021 0517 Last data filed at 08/17/2021 0444 Gross per 24 hour  Intake 1240 ml  Output 1500 ml  Net -260 ml   Filed Weights   08/14/21 1529 08/14/21 2245  Weight: 72.6 kg 69 kg    Examination: Tmax  98.9   General appearance:    bm > stated age, nad at 30 degrees s active bleeing / congested sounding cough   At Rest 02 sats  97% on RA  No jvd Oropharynx clear,  mucosa nl Neck supple Lungs with a few scattered exp > insp rhonchi bilaterally RRR no s3 or or sign murmur Abd obese with nl  excursion  Extr warm with no edema or clubbing noted Neuro  Sensorium poor short term recall,  no apparent motor deficits   Resolved Hospital Problem list     Assessment & Plan:  Hemoptysis ? Etiology  -Little clinically to support a recurrent aspiration pneumonia, but agree with treating with Unasyn and watching to see if there is any improvement, resolution of his hemoptysis since this is the most conservative and least invasive approach.  If persists despite treatment then would consider bronchoscopy with transbronchial biopsies, possibly after PET scan if able to arrange as an outpatient -If his hemoptysis does resolve then he will need a repeat CT chest in about 6 weeks to look for improvement, evolution of his right middle lobe opacity and cavitary lesion.  As above he may still need PET scan, bronchoscopy to evaluate further. -   consider ENT evaluation  - bleeding from the lung can be aspirated from the nose but not viceversa obviously  -Swallowing precautions -Continue off anticoagulation -Tight blood pressure control -Cough suppression would be beneficial/ no more ACEi use in this setting.   HBP - tighter control since epistaxis may be partly due to hbp >>> added clonidine am 11/24 with B  160/90  At risk of  Blood loss anemia  / monitoring  Lab Results  Component Value Date   HGB 11.2 (L) 08/17/2021   HGB 11.7 (L) 08/15/2021   HGB 11.9 (L) 08/14/2021     Best Practice (right click and "Reselect all SmartList Selections" daily)  Per [facility] plans  Labs   CBC: Recent Labs  Lab 08/14/21 1642 08/15/21 0523  WBC 7.5 5.8  NEUTROABS 4.6  --   HGB 11.9* 11.7*  HCT 36.6* 35.7*  MCV 89.1 88.4  PLT 249 466    Basic Metabolic Panel: Recent Labs  Lab 08/14/21 1642  NA 138  K 3.5  CL 103  CO2 25  GLUCOSE 88  BUN 19  CREATININE 0.93  CALCIUM 8.9   GFR: Estimated Creatinine Clearance: 86.6 mL/min (by C-G formula based on SCr of 0.93 mg/dL). Recent Labs  Lab 08/14/21 1642 08/15/21 0523  WBC 7.5 5.8    Liver Function Tests: Recent Labs  Lab 08/14/21 1642  AST 14*  ALT 10  ALKPHOS 96  BILITOT 1.3*  PROT 8.4*  ALBUMIN 3.6   No results for input(s): LIPASE, AMYLASE in the last 168 hours. No results for input(s): AMMONIA in the last 168 hours.  ABG    Component Value Date/Time   TCO2 21 (L) 05/09/2020 1222     Coagulation Profile: Recent Labs  Lab 08/14/21 1642  INR 1.1      Christinia Gully, MD Pulmonary and [place] Cell [phone]   After 7:00 pm call Elink  [phone]

## 2021-08-17 NOTE — Progress Notes (Signed)
PROGRESS NOTE    Darren Pearson  ZOX:[id number] DOB: 07-28-1966 DOA: 08/14/2021 PCP: Thompson Grayer, MD    Chief Complaint  Patient presents with   Hemoptysis    Brief Narrative:  55 year old gentleman with prior history of dementia, CVA, hypertension, asthma, stage II CKD, hepatitis C was brought into ED for evaluation of hemoptysis.  Sister over the phone reports that patient had a severe episode of coughing and had phlegm with some blood in it.  Patient at bedside appears comfortable, currently on 2 L of nasal cannula oxygen to keep sats greater than 90%. He was admitted for evaluation and management of aspiration pneumonia. CT angiogram of the chest shows pneumonia in the right middle and right lower lobe concerning for aspiration.  He was started on IV UNASYN and transitioned to oral augmentin.  Since two days pt had posterior nasal bleeding post sneezing.   Assessment & Plan:   Active Problems:   History of CVA (cerebrovascular accident)   Sepsis due to pneumonia ([place])   Aspiration pneumonia ([place])   HLD (hyperlipidemia)   HTN (hypertension)   Hemoptysis   Sepsis secondary to right middle and right lower lobe pneumonia CT of the chest concerning for aspiration pneumonia.  Recommend repeat imaging following resolution of symptoms given underlying mass not excluded on CT. Initially started on IV Rocephin and Zithromax and transitioned to Unasyn to augmentin.  Strep pneumonia is negative, COVID 19 negative. Blood cultures not done.  Legionella antigen is pending Sputum culture pending Nasal cannula oxygen to keep sats greater than 90% SLP evaluation to evaluate for aspiration. Therapy evaluations ordered.  Hemoptysis:  Recurrent, last episode yesterday afternoon. None ths am.  PCCM consulted, for evaluation of bronchoscopy.  Recommended outpatient follow up CT / PET scan for further evaluation.  Discontinued lovenox and aspirin.  SLP evaluation.    Essential  hypertension  Better controlled.  Added hydralazine and amlodipine.    History of CVA Aspirin held for hemoptysis  Therapy evaluations ordered for SNF.    History of dementia no behavioral abnormalities Patient oriented to self and place only.   Mild anemia/anemia of chronic disease/normocytic anemia Patient's hemoglobin is better than in February 2022 Continue to monitor   Nasal bleeding:  Posteriorly, probably contributing to the hemoptysis and malena and blood in the stools.  ENT consulted.     DVT prophylaxis: Lovenox Code Status: Full code Family Communication: Discussed with sister over the phone Disposition:   Status is: Observation  The patient will require care spanning > 2 midnights and should be moved to inpatient because: Ongoing diagnostic testing needed not appropriate for outpatient work up, Unsafe d/c plan, and IV treatments appropriate due to intensity of illness or inability to take PO  Dispo: The patient is from: SNF              Anticipated d/c is to: SNF              Patient currently is not medically stable to d/c.   Difficult to place patient No       Consultants:  None.   Procedures: none.  Antimicrobials:  Antibiotics Given (last 72 hours)     Date/Time Action Medication Dose Rate   08/14/21 2110 New Bag/Given   cefTRIAXone (ROCEPHIN) 1 g in sodium chloride 0.9 % 100 mL IVPB 1 g 200 mL/hr   08/14/21 2147 New Bag/Given   azithromycin (ZITHROMAX) 500 mg in sodium chloride 0.9 % 250 mL IVPB 500 mg 250  mL/hr   08/15/21 1417 New Bag/Given   Ampicillin-Sulbactam (UNASYN) 3 g in sodium chloride 0.9 % 100 mL IVPB 3 g 200 mL/hr   08/15/21 1721 New Bag/Given   Ampicillin-Sulbactam (UNASYN) 3 g in sodium chloride 0.9 % 100 mL IVPB 3 g 200 mL/hr   08/16/21 0026 New Bag/Given   Ampicillin-Sulbactam (UNASYN) 3 g in sodium chloride 0.9 % 100 mL IVPB 3 g 200 mL/hr   08/16/21 6761 New Bag/Given   Ampicillin-Sulbactam (UNASYN) 3 g in sodium  chloride 0.9 % 100 mL IVPB 3 g 200 mL/hr   08/16/21 1400 New Bag/Given   Ampicillin-Sulbactam (UNASYN) 3 g in sodium chloride 0.9 % 100 mL IVPB 3 g 200 mL/hr   08/16/21 1833 New Bag/Given   Ampicillin-Sulbactam (UNASYN) 3 g in sodium chloride 0.9 % 100 mL IVPB 3 g 200 mL/hr   08/16/21 2316 New Bag/Given   Ampicillin-Sulbactam (UNASYN) 3 g in sodium chloride 0.9 % 100 mL IVPB 3 g 200 mL/hr   08/17/21 0555 New Bag/Given   Ampicillin-Sulbactam (UNASYN) 3 g in sodium chloride 0.9 % 100 mL IVPB 3 g 200 mL/hr   08/17/21 1043 Given   amoxicillin-clavulanate (AUGMENTIN) 875-125 MG per tablet 1 tablet 1 tablet         Subjective: Pt reports hemoptysis last night.   Objective: Vitals:   08/16/21 1453 08/16/21 1539 08/16/21 2023 08/17/21 0535  BP: 139/87 (!) 177/94 (!) 157/90 (!) 154/95  Pulse: 85 98 95 84  Resp: 15 15 19 18   Temp: 98.8 F (37.1 C) 98.7 F (37.1 C) 98.9 F (37.2 C) 98 F (36.7 C)  TempSrc: Oral Oral Oral Oral  SpO2: 98% 94% 97% 95%  Weight:      Height:        Intake/Output Summary (Last 24 hours) at 08/17/2021 1328 Last data filed at 08/17/2021 1043 Gross per 24 hour  Intake 1360 ml  Output 2600 ml  Net -1240 ml    Filed Weights   08/14/21 1529 08/14/21 2245  Weight: 72.6 kg 69 kg    Examination:  General exam: Appears calm and comfortable  Respiratory system: Clear to auscultation. Respiratory effort normal. Cardiovascular system: S1 & S2 heard, RRR. No JVD,  No pedal edema. Gastrointestinal system: Abdomen is nondistended, soft and nontender. No organomegaly or masses felt. Normal bowel sounds heard. Central nervous system: Alert and oriented. No focal neurological deficits. Extremities: Symmetric 5 x 5 power. Skin: No rashes, lesions or ulcers Psychiatry: Mood & affect appropriate.        Data Reviewed: I have personally reviewed following labs and imaging studies  CBC: Recent Labs  Lab 08/14/21 1642 08/15/21 0523 08/17/21 0548  WBC 7.5  5.8 5.8  NEUTROABS 4.6  --   --   HGB 11.9* 11.7* 11.2*  HCT 36.6* 35.7* 33.2*  MCV 89.1 88.4 87.4  PLT 249 241 252     Basic Metabolic Panel: Recent Labs  Lab 08/14/21 1642 08/17/21 0548  NA 138 137  K 3.5 3.4*  CL 103 104  CO2 25 23  GLUCOSE 88 90  BUN 19 13  CREATININE 0.93 0.78  CALCIUM 8.9 8.8*     GFR: Estimated Creatinine Clearance: 100.6 mL/min (by C-G formula based on SCr of 0.78 mg/dL).  Liver Function Tests: Recent Labs  Lab 08/14/21 1642  AST 14*  ALT 10  ALKPHOS 96  BILITOT 1.3*  PROT 8.4*  ALBUMIN 3.6     CBG: No results for input(s): GLUCAP in the  last 168 hours.   Recent Results (from the past 240 hour(s))  SARS CORONAVIRUS 2 (TAT 6-24 HRS) Nasopharyngeal Nasopharyngeal Swab     Status: None   Collection Time: 08/14/21  9:17 PM   Specimen: Nasopharyngeal Swab  Result Value Ref Range Status   SARS Coronavirus 2 NEGATIVE NEGATIVE Final    Comment: (NOTE) SARS-CoV-2 target nucleic acids are NOT DETECTED.  The SARS-CoV-2 RNA is generally detectable in upper and lower respiratory specimens during the acute phase of infection. Negative results do not preclude SARS-CoV-2 infection, do not rule out co-infections with other pathogens, and should not be used as the sole basis for treatment or other patient management decisions. Negative results must be combined with clinical observations, patient history, and epidemiological information. The expected result is Negative.  Fact Sheet for Patients: SugarRoll.be  Fact Sheet for Healthcare Providers: https://www.woods-mathews.com/  This test is not yet approved or cleared by the [place] FDA and  has been authorized for detection and/or diagnosis of SARS-CoV-2 by FDA under an Emergency Use Authorization (EUA). This EUA will remain  in effect (meaning this test can be used) for the duration of the COVID-19 declaration under Se ction 564(b)(1) of the  Act, 21 U.S.C. section 360bbb-3(b)(1), unless the authorization is terminated or revoked sooner.  Performed at [facility], [place] [address], [place], [place] [address]   Expectorated Sputum Assessment w Gram Stain, Rflx to Resp Cult     Status: None   Collection Time: 08/15/21  6:47 AM   Specimen: Expectorated Sputum  Result Value Ref Range Status   Specimen Description EXPECTORATED SPUTUM  Final   Special Requests NONE  Final   Sputum evaluation   Final    THIS SPECIMEN IS ACCEPTABLE FOR SPUTUM CULTURE Performed at [facility], [place] [address] [place], [place] [address]    Report Status 08/15/2021 FINAL  Final  Culture, Respiratory w Gram Stain     Status: None   Collection Time: 08/15/21  6:47 AM  Result Value Ref Range Status   Specimen Description   Final    EXPECTORATED SPUTUM Performed at [facility], [place] [address], [place], [place] [address]    Special Requests   Final    NONE Reflexed from [id number] Performed at [facility], [place] [address], [place], [place] [address]    Gram Stain   Final    RARE WBC PRESENT, PREDOMINANTLY MONONUCLEAR RARE GRAM POSITIVE COCCI RARE GRAM NEGATIVE RODS    Culture   Final    FEW Consistent with normal respiratory flora. No Pseudomonas species isolated Performed at [place] [address] [place], [place] [address]    Report Status 08/17/2021 FINAL  Final          Radiology Studies: No results found.      Scheduled Meds:  amLODipine  5 mg Oral Daily   amoxicillin-clavulanate  1 tablet Oral Q12H   atorvastatin  40 mg Oral q1800   cloNIDine  0.1 mg Oral TID   ferrous sulfate  325 mg Oral Q breakfast   hydrALAZINE  25 mg Oral Q8H   loratadine  10 mg Oral Daily   oxymetazoline  1 spray Each Nare BID   sertraline  50 mg Oral Daily   sodium chloride  1 spray Each Nare Q2H   cyanocobalamin  1,000 mcg Oral Daily   Continuous Infusions:      LOS: 2 days  Hosie Poisson, MD [facility]   To contact the attending provider between 7A-7P or the covering provider during after hours 7P-7A, please log into the web site [email or web address] and access using universal  password for that web site. If you do not have the password, please call the hospital operator.  08/17/2021, 1:28 PM

## 2021-08-17 NOTE — Procedures (Signed)
Preop diagnosis: Epistaxis Postop diagnosis: same Procedure: Anterior-posterior nasal packing placement Surgeon: Redmond Baseman Anesth: Topical with 4% lidocaine Compl: None Findings: No bleeding or source of bleeding identified Description:  After discussing risks, benefits, and alternatives, the patient was placed in a seated position.  The right nasal passage was sprayed with topical anesthetic.  A 10 cm Merocel pack was placed in the right side coated in Bacitacin ointment.  The pack was saturated with saline.  The string was cut off. He was returned to nursing care in stable condition.

## 2021-08-17 NOTE — Consult Note (Addendum)
Reason for Consult: Hemoptysis Referring Physician: Hospitalist  Darren Pearson is an 55 y.o. male.  HPI: 55 year old male with multiple medical problems presented to the ER three days ago after coughing up blood.  He was admitted and has been treated for potential pneumonia.  Bleeding has recurred and has come from the nose, including today.  He senses a right-sided source although nursing is not sure.  Past Medical History:  Diagnosis Date   Allergy    Anemia    Anxiety and depression    Asthma    CVA (cerebral vascular accident) ([place])    Hepatitis C    History of chicken pox    Hypertension     Past Surgical History:  Procedure Laterality Date   BUBBLE STUDY  11/22/2019   Procedure: BUBBLE STUDY;  Surgeon: Dorothy Spark, MD;  Location: [facility];  Service: Cardiovascular;;   LOOP RECORDER INSERTION N/A 11/22/2019   Procedure: LOOP RECORDER INSERTION;  Surgeon: Thompson Grayer, MD;  Location: [facility];  Service: Cardiovascular;  Laterality: N/A;   TEE WITHOUT CARDIOVERSION N/A 11/22/2019   Procedure: TRANSESOPHAGEAL ECHOCARDIOGRAM (TEE);  Surgeon: Dorothy Spark, MD;  Location: [facility] ENDOSCOPY;  Service: Cardiovascular;  Laterality: N/A;    Family History  Problem Relation Age of Onset   Stomach cancer Mother 33   Diabetes Father    Paget's disease of bone Father    Colon cancer Neg Hx    Esophageal cancer Neg Hx    Pancreatic cancer Neg Hx    Liver disease Neg Hx     Social History:  reports that he has quit smoking. His smoking use included cigarettes. He started smoking about 8 years ago. He smoked an average of .25 packs per day. He has never used smokeless tobacco. He reports that he does not currently use alcohol after a past usage of about 2.0 standard drinks per week. He reports that he does not currently use drugs after having used the following drugs: Marijuana. Frequency: 5.00 times per week.  Allergies:  Allergies  Allergen Reactions   Sulfa  Antibiotics Hives    Medications: I have reviewed the patient's current medications.  Results for orders placed or performed during the hospital encounter of 08/14/21 (from the past 48 hour(s))  CBC     Status: Abnormal   Collection Time: 08/17/21  5:48 AM  Result Value Ref Range   WBC 5.8 4.0 - 10.5 K/uL   RBC 3.80 (L) 4.22 - 5.81 MIL/uL   Hemoglobin 11.2 (L) 13.0 - 17.0 g/dL   HCT 33.2 (L) 39.0 - 52.0 %   MCV 87.4 80.0 - 100.0 fL   MCH 29.5 26.0 - 34.0 pg   MCHC 33.7 30.0 - 36.0 g/dL   RDW 13.3 11.5 - 15.5 %   Platelets 252 150 - 400 K/uL   nRBC 0.0 0.0 - 0.2 %    Comment: Performed at [facility], [place] [address], [place], [place] [address]  Basic metabolic panel     Status: Abnormal   Collection Time: 08/17/21  5:48 AM  Result Value Ref Range   Sodium 137 135 - 145 mmol/L   Potassium 3.4 (L) 3.5 - 5.1 mmol/L   Chloride 104 98 - 111 mmol/L   CO2 23 22 - 32 mmol/L   Glucose, Bld 90 70 - 99 mg/dL    Comment: Glucose reference range applies only to samples taken after fasting for at least 8 hours.  BUN 13 6 - 20 mg/dL   Creatinine, Ser 0.78 0.61 - 1.24 mg/dL   Calcium 8.8 (L) 8.9 - 10.3 mg/dL   GFR, Estimated >60 >60 mL/min    Comment: (NOTE) Calculated using the CKD-EPI Creatinine Equation (2021)    Anion gap 10 5 - 15    Comment: Performed at [facility], [place] [address] [place], [place] [address]    No results found.  Review of Systems  HENT:  Positive for nosebleeds.   Respiratory:  Positive for cough.   All other systems reviewed and are negative. Blood pressure (!) 147/91, pulse 80, temperature 98 F (36.7 C), temperature source Oral, resp. rate 17, height 5\' 9"  (1.753 m), weight 69 kg, SpO2 98 %. Physical Exam Constitutional:      Appearance: Normal appearance. He is normal weight.  HENT:     Head: Normocephalic and atraumatic.     Right Ear: External ear normal.     Left Ear: External ear normal.     Nose: Nose  normal.     Comments: No mass, no source of bleeding evident on anterior exam, septum with mild leftward deviation.    Mouth/Throat:     Mouth: Mucous membranes are moist.     Pharynx: Oropharynx is clear.  Eyes:     Extraocular Movements: Extraocular movements intact.     Conjunctiva/sclera: Conjunctivae normal.     Pupils: Pupils are equal, round, and reactive to light.  Cardiovascular:     Rate and Rhythm: Normal rate.  Pulmonary:     Effort: Pulmonary effort is normal.  Musculoskeletal:     Cervical back: Normal range of motion.  Skin:    General: Skin is warm and dry.  Neurological:     General: No focal deficit present.     Mental Status: He is alert and oriented to person, place, and time.  Psychiatric:        Mood and Affect: Mood normal.        Behavior: Behavior normal.        Thought Content: Thought content normal.        Judgment: Judgment normal.    Assessment/Plan: Hemoptysis, epistaxis  A clear source of bleeding is not clear on exam.  I placed anterior-posterior packing that we will leave in place at least three days.  If bleeding continues to recur, we will have to consider further evaluation.  [name] 08/17/2021, 3:11 PM

## 2021-08-18 DIAGNOSIS — J690 Pneumonitis due to inhalation of food and vomit: Secondary | ICD-10-CM | POA: Diagnosis not present

## 2021-08-18 DIAGNOSIS — R042 Hemoptysis: Secondary | ICD-10-CM | POA: Diagnosis not present

## 2021-08-18 LAB — BASIC METABOLIC PANEL
Anion gap: 9 (ref 5–15)
BUN: 15 mg/dL (ref 6–20)
CO2: 24 mmol/L (ref 22–32)
Calcium: 8.9 mg/dL (ref 8.9–10.3)
Chloride: 105 mmol/L (ref 98–111)
Creatinine, Ser: 1 mg/dL (ref 0.61–1.24)
GFR, Estimated: >60 mL/min (ref >60)
Glucose, Bld: 87 mg/dL (ref 70–99)
Potassium: 3.9 mmol/L (ref 3.5–5.1)
Sodium: 138 mmol/L (ref 135–145)

## 2021-08-18 LAB — CBC
HCT: 35.1 % — ABNORMAL LOW (ref 39.0–52.0)
Hemoglobin: 11.6 g/dL — ABNORMAL LOW (ref 13.0–17.0)
MCH: 29 pg (ref 26.0–34.0)
MCHC: 33 g/dL (ref 30.0–36.0)
MCV: 87.8 fL (ref 80.0–100.0)
Platelets: 278 10*3/uL (ref 150–400)
RBC: 4 MIL/uL — ABNORMAL LOW (ref 4.22–5.81)
RDW: 13.6 % (ref 11.5–15.5)
WBC: 3.8 10*3/uL — ABNORMAL LOW (ref 4.0–10.5)
nRBC: 0 % (ref 0.0–0.2)

## 2021-08-18 LAB — SEDIMENTATION RATE: Sed Rate: 86 mm/hr — ABNORMAL HIGH (ref 0–16)

## 2021-08-18 NOTE — Progress Notes (Signed)
Subjective: No bleeding overnight.  Objective: Vital signs in last 24 hours: Temp:  [97.5 F (36.4 C)-98.6 F (37 C)] 97.5 F (36.4 C) (09/25 0433) Pulse Rate:  [75-93] 75 (09/25 0433) Resp:  [14-17] 14 (09/25 0433) BP: (114-147)/(76-91) 123/90 (09/25 1134) SpO2:  [95 %-98 %] 97 % (09/25 0433) Wt Readings from Last 1 Encounters:  08/14/21 69 kg    Intake/Output from previous day: 09/24 0701 - 09/25 0700 In: 600 [P.O.:600] Out: 525 [Urine:525] Intake/Output this shift: Total I/O In: 120 [P.O.:120] Out: -   General appearance: alert, cooperative, and no distress Nose: right nasal passage with pack in place3  Recent Labs    08/17/21 0548 08/18/21 1130  WBC 5.8 3.8*  HGB 11.2* 11.6*  HCT 33.2* 35.1*  PLT 252 278    Recent Labs    08/17/21 0548 08/18/21 1132  NA 137 138  K 3.4* 3.9  CL 104 105  CO2 23 24  GLUCOSE 90 87  BUN 13 15  CREATININE 0.78 1.00  CALCIUM 8.8* 8.9    Medications: I have reviewed the patient's current medications.  Assessment/Plan: Hemoptysis/epistaxis s/p right nasal packing  Pack in place.  No bleeding.  Plan pack removal 9/28.   LOS: 3 days   [name] 08/18/2021, 1:09 PM

## 2021-08-18 NOTE — Progress Notes (Signed)
PROGRESS NOTE    Darren Pearson  JHE:[id number] DOB: [birth year] DOA: 08/14/2021 PCP: Thompson Grayer, MD    Chief Complaint  Patient presents with   Hemoptysis    Brief Narrative:  55 year old gentleman with prior history of dementia, CVA, hypertension, asthma, stage II CKD, hepatitis C was brought into ED for evaluation of hemoptysis.  Sister over the phone reports that patient had a severe episode of coughing and had phlegm with some blood in it.  Patient at bedside appears comfortable, currently on 2 L of nasal cannula oxygen to keep sats greater than 90%. He was admitted for evaluation and management of aspiration pneumonia. CT angiogram of the chest shows pneumonia in the right middle and right lower lobe concerning for aspiration.  He was started on IV UNASYN and transitioned to oral augmentin.  Since two days pt had posterior nasal bleeding after sneezing. Pt seen and examined at bedside. No nose bleeding overnight.   Assessment & Plan:   Active Problems:   History of CVA (cerebrovascular accident)   Sepsis due to pneumonia ([place])   Aspiration pneumonia ([place])   HLD (hyperlipidemia)   HTN (hypertension)   Hemoptysis   Sepsis secondary to right middle and right lower lobe pneumonia CT of the chest concerning for aspiration pneumonia.  Recommend repeat imaging following resolution of symptoms given underlying mass not excluded on CT. Initially started on IV Rocephin and Zithromax and transitioned to Unasyn to augmentin.  Strep pneumonia is negative, COVID 19 negative. Blood cultures not done.  Legionella antigen is negative.  Sputum culture shows normal fora Nasal cannula oxygen to keep sats greater than 90% SLP evaluation to evaluate for aspiration cleared him for regular diet.  Therapy evaluations ordered recommending SNF.   Hemoptysis:  Recurrent, last episode yesterday afternoon. None ths am.  PCCM consulted, for evaluation of bronchoscopy.  Recommended outpatient  follow up CT / PET scan for further evaluation.  Discontinued lovenox and aspirin.  Probably nose bleeding, ENT consulted and he underwent packing .  No bleeding overnight.    Essential hypertension  Well controlled.  Added hydralazine and amlodipine.    History of CVA Aspirin held for hemoptysis  Therapy evaluations ordered for SNF.    History of dementia no behavioral abnormalities Patient oriented to self and place only.   Mild anemia/anemia of chronic disease/normocytic anemia Patient's hemoglobin is better than in February 2022 Continue to monitor   Nasal bleeding:  Posteriorly, probably contributing to the hemoptysis and malena and blood in the stools.  ENT consulted.  Packing done, no bleeding overnight.  Repeat CBC in am.    Hypokalemia:  Replaced.     DVT prophylaxis: Lovenox Code Status: Full code Family Communication: none at bedside.  Disposition:   Status is: Observation  The patient will require care spanning > 2 midnights and should be moved to inpatient because: Ongoing diagnostic testing needed not appropriate for outpatient work up, Unsafe d/c plan, and IV treatments appropriate due to intensity of illness or inability to take PO  Dispo: The patient is from: SNF              Anticipated d/c is to: SNF              Patient currently is not medically stable to d/c.   Difficult to place patient No       Consultants:  PCCM ENT.   Procedures: none.  Antimicrobials:  Antibiotics Given (last 72 hours)     Date/Time Action  Medication Dose Rate   08/15/21 1417 New Bag/Given   Ampicillin-Sulbactam (UNASYN) 3 g in sodium chloride 0.9 % 100 mL IVPB 3 g 200 mL/hr   08/15/21 1721 New Bag/Given   Ampicillin-Sulbactam (UNASYN) 3 g in sodium chloride 0.9 % 100 mL IVPB 3 g 200 mL/hr   08/16/21 0026 New Bag/Given   Ampicillin-Sulbactam (UNASYN) 3 g in sodium chloride 0.9 % 100 mL IVPB 3 g 200 mL/hr   08/16/21 8546 New Bag/Given    Ampicillin-Sulbactam (UNASYN) 3 g in sodium chloride 0.9 % 100 mL IVPB 3 g 200 mL/hr   08/16/21 1400 New Bag/Given   Ampicillin-Sulbactam (UNASYN) 3 g in sodium chloride 0.9 % 100 mL IVPB 3 g 200 mL/hr   08/16/21 1833 New Bag/Given   Ampicillin-Sulbactam (UNASYN) 3 g in sodium chloride 0.9 % 100 mL IVPB 3 g 200 mL/hr   08/16/21 2316 New Bag/Given   Ampicillin-Sulbactam (UNASYN) 3 g in sodium chloride 0.9 % 100 mL IVPB 3 g 200 mL/hr   08/17/21 0555 New Bag/Given   Ampicillin-Sulbactam (UNASYN) 3 g in sodium chloride 0.9 % 100 mL IVPB 3 g 200 mL/hr   08/17/21 1043 Given   amoxicillin-clavulanate (AUGMENTIN) 875-125 MG per tablet 1 tablet 1 tablet    08/17/21 2120 Given  [five rights of med administration applied  LOT [id number] A EXP 06/12/2022]   amoxicillin-clavulanate (AUGMENTIN) 875-125 MG per tablet 1 tablet 1 tablet         Subjective: No hemoptysis or nasal bleeding.   Objective: Vitals:   08/17/21 1420 08/17/21 1924 08/17/21 2019 08/18/21 0433  BP: (!) 147/91 132/80 124/76 114/79  Pulse: 80 93 82 75  Resp: 17 16 14 14   Temp: 98 F (36.7 C) 98 F (36.7 C) 98.6 F (37 C) (!) 97.5 F (36.4 C)  TempSrc: Oral Oral Oral Oral  SpO2: 98% 95% 97% 97%  Weight:      Height:        Intake/Output Summary (Last 24 hours) at 08/18/2021 1130 Last data filed at 08/17/2021 2141 Gross per 24 hour  Intake 120 ml  Output 525 ml  Net -405 ml    Filed Weights   08/14/21 1529 08/14/21 2245  Weight: 72.6 kg 69 kg    Examination:  General exam: Appears calm and comfortable  Respiratory system: Clear to auscultation. Respiratory effort normal. Cardiovascular system: S1 & S2 heard, RRR. No JVD,  No pedal edema. Gastrointestinal system: Abdomen is nondistended, soft and nontender. Normal bowel sounds heard. Central nervous system: Alert and oriented. No focal neurological deficits. Extremities: Symmetric 5 x 5 power. Skin: No rashes, lesions or ulcers Psychiatry: Mood & affect  appropriate.        Data Reviewed: I have personally reviewed following labs and imaging studies  CBC: Recent Labs  Lab 08/14/21 1642 08/15/21 0523 08/17/21 0548  WBC 7.5 5.8 5.8  NEUTROABS 4.6  --   --   HGB 11.9* 11.7* 11.2*  HCT 36.6* 35.7* 33.2*  MCV 89.1 88.4 87.4  PLT 249 241 252     Basic Metabolic Panel: Recent Labs  Lab 08/14/21 1642 08/17/21 0548  NA 138 137  K 3.5 3.4*  CL 103 104  CO2 25 23  GLUCOSE 88 90  BUN 19 13  CREATININE 0.93 0.78  CALCIUM 8.9 8.8*     GFR: Estimated Creatinine Clearance: 100.6 mL/min (by C-G formula based on SCr of 0.78 mg/dL).  Liver Function Tests: Recent Labs  Lab 08/14/21 1642  AST 14*  ALT 10  ALKPHOS 96  BILITOT 1.3*  PROT 8.4*  ALBUMIN 3.6     CBG: No results for input(s): GLUCAP in the last 168 hours.   Recent Results (from the past 240 hour(s))  SARS CORONAVIRUS 2 (TAT 6-24 HRS) Nasopharyngeal Nasopharyngeal Swab     Status: None   Collection Time: 08/14/21  9:17 PM   Specimen: Nasopharyngeal Swab  Result Value Ref Range Status   SARS Coronavirus 2 NEGATIVE NEGATIVE Final    Comment: (NOTE) SARS-CoV-2 target nucleic acids are NOT DETECTED.  The SARS-CoV-2 RNA is generally detectable in upper and lower respiratory specimens during the acute phase of infection. Negative results do not preclude SARS-CoV-2 infection, do not rule out co-infections with other pathogens, and should not be used as the sole basis for treatment or other patient management decisions. Negative results must be combined with clinical observations, patient history, and epidemiological information. The expected result is Negative.  Fact Sheet for Patients: SugarRoll.be  Fact Sheet for Healthcare Providers: https://www.woods-mathews.com/  This test is not yet approved or cleared by the [place] FDA and  has been authorized for detection and/or diagnosis of SARS-CoV-2 by FDA  under an Emergency Use Authorization (EUA). This EUA will remain  in effect (meaning this test can be used) for the duration of the COVID-19 declaration under Se ction 564(b)(1) of the Act, 21 U.S.C. section 360bbb-3(b)(1), unless the authorization is terminated or revoked sooner.  Performed at [facility], [place] [address], [place], [place] [address]   Expectorated Sputum Assessment w Gram Stain, Rflx to Resp Cult     Status: None   Collection Time: 08/15/21  6:47 AM   Specimen: Expectorated Sputum  Result Value Ref Range Status   Specimen Description EXPECTORATED SPUTUM  Final   Special Requests NONE  Final   Sputum evaluation   Final    THIS SPECIMEN IS ACCEPTABLE FOR SPUTUM CULTURE Performed at [facility], [place] [address] [place], [place] [address]    Report Status 08/15/2021 FINAL  Final  Culture, Respiratory w Gram Stain     Status: None   Collection Time: 08/15/21  6:47 AM  Result Value Ref Range Status   Specimen Description   Final    EXPECTORATED SPUTUM Performed at [facility], [place] [address] [place], [place] [address]    Special Requests   Final    NONE Reflexed from [id number] Performed at [facility], [place] [address] [place], [place] [address]    Gram Stain   Final    RARE WBC PRESENT, PREDOMINANTLY MONONUCLEAR RARE GRAM POSITIVE COCCI RARE GRAM NEGATIVE RODS    Culture   Final    FEW Consistent with normal respiratory flora. No Pseudomonas species isolated Performed at [place] [address], [place], [place] [address]    Report Status 08/17/2021 FINAL  Final          Radiology Studies: No results found.      Scheduled Meds:  amLODipine  5 mg Oral Daily   amoxicillin-clavulanate  1 tablet Oral Q12H   atorvastatin  40 mg Oral q1800   cloNIDine  0.1 mg Oral TID   ferrous sulfate  325 mg Oral Q breakfast   hydrALAZINE  25 mg Oral Q8H   loratadine  10 mg Oral Daily    oxymetazoline  1 spray Each Nare BID   sertraline  50 mg Oral Daily   cyanocobalamin  1,000 mcg Oral  Daily   Continuous Infusions:     LOS: 3 days        Hosie Poisson, MD [facility]   To contact the attending provider between 7A-7P or the covering provider during after hours 7P-7A, please log into the web site [email or web address] and access using universal [place] password for that web site. If you do not have the password, please call the hospital operator.  08/18/2021, 11:30 AM

## 2021-08-18 NOTE — Progress Notes (Signed)
NAME:  Darren Pearson, MRN:  [id number], DOB:  Apr 09, 1966, LOS: 3 ADMISSION DATE:  08/14/2021, CONSULTATION DATE: 08/15/2021 REFERRING MD: Dr. Karleen Hampshire, CHIEF COMPLAINT: Hemoptysis  History of Present Illness:  55 year old man, former smoker, with a history of poorly controlled hypertension, CVA's with associated mild dysphagia, dementia, hepatitis C, CKD stage II.  He resides in a skilled nursing facility due to his dementia.  History of right middle lobe pneumonia thought related to aspiration event in 12/2020.  Difficult historian but states that he began to experience cough about 2 days PTA then began to see hemoptysis.  Difficult to quantify but possibly some clots.  No witnessed aspiration event.  He also reports some epistaxis beginning one day PTA, ? How this may be contributing. CT-PA performed and showed no PE but a focal posterior right middle lobe infiltrate with some distal central clearing, question abscess.  He was started on ceftriaxone and azithromycin.  PCCM consulted regarding the hemoptysis.   Pertinent  Medical History   Past Medical History:  Diagnosis Date   Allergy    Anemia    Anxiety and depression    Asthma    CVA (cerebral vascular accident) ([place])    Hepatitis C    History of chicken pox    Hypertension     Significant Hospital Events: Including procedures, antibiotic start and stop dates in addition to other pertinent events   CT-PA 08/14/2021 >> bilateral apical bullae, posterior right middle lobe consolidative change with some cystic central clearing more peripherally, more subtle patchy micronodular infiltrate in the right lower lobe, 2.5 cm right hilar lymphadenopathy ENT eval [facility]) 08/17/21 > no def source > R nasal packing done    Scheduled Meds:  amLODipine  5 mg Oral Daily   amoxicillin-clavulanate  1 tablet Oral Q12H   atorvastatin  40 mg Oral q1800   cloNIDine  0.1 mg Oral TID   ferrous sulfate  325 mg Oral Q breakfast   hydrALAZINE  25 mg Oral Q8H    loratadine  10 mg Oral Daily   oxymetazoline  1 spray Each Nare BID   sertraline  50 mg Oral Daily   cyanocobalamin  1,000 mcg Oral Daily   Continuous Infusions: PRN Meds:.diphenhydrAMINE, guaiFENesin-codeine, labetalol    Interim History / Subjective:  No further hemoptysis reported    Objective   Blood pressure 114/79, pulse 75, temperature (!) 97.5 F (36.4 C), temperature source Oral, resp. rate 14, height 5\' 9"  (1.753 m), weight 69 kg, SpO2 97 %.        Intake/Output Summary (Last 24 hours) at 08/18/2021 1113 Last data filed at 08/17/2021 2141 Gross per 24 hour  Intake 120 ml  Output 525 ml  Net -405 ml   Filed Weights   08/14/21 1529 08/14/21 2245  Weight: 72.6 kg 69 kg    Examination: Tmax  98.6 General appearance:   elderly wm, very stoic appearing and a bit withdrawn this am, denies sob, cp, heme  At Rest 02 sats  97% on RA  No jvd Oropharynx clear,  mucosa nl Neck supple/ R nasal packing in place  Lungs with a few scattered exp > insp rhonchi bilaterally RRR no s3 or or sign murmur Abd soft /nl excursion  Extr warm with no edema or clubbing noted Neuro  Sensorium as above,  no apparent motor deficits   Resolved Hospital Problem list     Assessment & Plan:  Hemoptysis ? Etiology  -Little clinically to support a recurrent  aspiration pneumonia, but agree with treating with Unasyn and watching to see if there is any improvement, resolution of his hemoptysis since this is the most conservative and least invasive approach.  If persists despite treatment then would consider bronchoscopy with transbronchial biopsies, possibly after PET scan if able to arrange as an outpatient -If his hemoptysis does resolve then he will need a repeat CT chest in about 6 weeks to look for improvement, evolution of his right middle lobe opacity and cavitary lesion.  As above he may still need PET scan, bronchoscopy to evaluate further. -   ENT evaluation  done 9/24  > R  nose  packing and no further bleeding noted    >>> repeat cbc 9/25 and cxr am 9/26   HBP - tighter control since epistaxis may be partly due to hbp >>> added clonidine am 11/24 with B  160/90 and much better am 9/25   At risk of  Blood loss anemia  / monitoring  Lab Results  Component Value Date   HGB 11.2 (L) 08/17/2021   HGB 11.7 (L) 08/15/2021   HGB 11.9 (L) 08/14/2021     Best Practice (right click and "Reselect all SmartList Selections" daily)  Per [facility] plans  Labs   CBC: Recent Labs  Lab 08/14/21 1642 08/15/21 0523 08/17/21 0548  WBC 7.5 5.8 5.8  NEUTROABS 4.6  --   --   HGB 11.9* 11.7* 11.2*  HCT 36.6* 35.7* 33.2*  MCV 89.1 88.4 87.4  PLT 249 241 007    Basic Metabolic Panel: Recent Labs  Lab 08/14/21 1642 08/17/21 0548  NA 138 137  K 3.5 3.4*  CL 103 104  CO2 25 23  GLUCOSE 88 90  BUN 19 13  CREATININE 0.93 0.78  CALCIUM 8.9 8.8*   GFR: Estimated Creatinine Clearance: 100.6 mL/min (by C-G formula based on SCr of 0.78 mg/dL). Recent Labs  Lab 08/14/21 1642 08/15/21 0523 08/17/21 0548  WBC 7.5 5.8 5.8    Liver Function Tests: Recent Labs  Lab 08/14/21 1642  AST 14*  ALT 10  ALKPHOS 96  BILITOT 1.3*  PROT 8.4*  ALBUMIN 3.6   No results for input(s): LIPASE, AMYLASE in the last 168 hours. No results for input(s): AMMONIA in the last 168 hours.  ABG    Component Value Date/Time   TCO2 21 (L) 05/09/2020 1222     Coagulation Profile: Recent Labs  Lab 08/14/21 1642  INR 1.1     Christinia Gully, MD [facility] [place] [facility] [phone]   After 7:00 pm call Elink  [phone]

## 2021-08-18 NOTE — Progress Notes (Signed)
Darren Pearson sat up in the recliner for about 2 1/2 hours, tolerated well. Sister was in to visit him. She fed him lunch. Patient requestd to go back to bed, transfers with assist of one, did well. Sleeping.

## 2021-08-19 ENCOUNTER — Other Ambulatory Visit: Payer: Self-pay

## 2021-08-19 ENCOUNTER — Inpatient Hospital Stay (HOSPITAL_COMMUNITY): Payer: Medicaid Other

## 2021-08-19 ENCOUNTER — Telehealth: Payer: Self-pay | Admitting: Nurse Practitioner

## 2021-08-19 DIAGNOSIS — J449 Chronic obstructive pulmonary disease, unspecified: Secondary | ICD-10-CM

## 2021-08-19 LAB — HEMOGLOBIN AND HEMATOCRIT, BLOOD
HCT: 33.8 % — ABNORMAL LOW (ref 39.0–52.0)
Hemoglobin: 11.2 g/dL — ABNORMAL LOW (ref 13.0–17.0)

## 2021-08-19 LAB — SARS CORONAVIRUS 2 (TAT 6-24 HRS): SARS Coronavirus 2: NEGATIVE

## 2021-08-19 MED ORDER — LORATADINE 10 MG PO TABS: 10.0000 mg | ORAL_TABLET | Freq: Every day | ORAL | Status: AC

## 2021-08-19 MED ORDER — AMOXICILLIN-POT CLAVULANATE 875-125 MG PO TABS: 1.0000 | ORAL_TABLET | Freq: Two times a day (BID) | ORAL | 0 refills | Status: DC

## 2021-08-19 MED ORDER — AMLODIPINE BESYLATE 5 MG PO TABS
5.0000 mg | ORAL_TABLET | Freq: Every day | ORAL | 0 refills | Status: AC
  Filled 2021-08-19: qty 30, 30d supply, fill #0

## 2021-08-19 MED ORDER — OXYMETAZOLINE HCL 0.05 % NA SOLN
1.0000 | Freq: Two times a day (BID) | NASAL | 0 refills | Status: AC
  Filled 2021-08-19: qty 30, 150d supply, fill #0

## 2021-08-19 MED ORDER — AMOXICILLIN-POT CLAVULANATE 875-125 MG PO TABS: 1.0000 | ORAL_TABLET | Freq: Two times a day (BID) | ORAL | 0 refills | Status: AC

## 2021-08-19 NOTE — Telephone Encounter (Signed)
Order placed for Super D to be done in 4 weeks. [facility] Team will contact patient to schedule Chest CT once we have spoken with patient. F/U appt made as well.   Called number on file, young lady states we have the wrong number.   Called emergency contact [name] she is on her way to the hospital and will call me back since she is driving to take down the information.   Will hold in triage until she calls back.

## 2021-08-19 NOTE — Progress Notes (Signed)
Report called to [facility] and spoke to [place]. Augmentin script was clarified with Dr. Karleen Hampshire and reported to [facility] to discard the script with 4 tabs of Augmentin on it. She was to use the one with 60 pills ordered on it and give it every 12 hours. [facility] transported the patient to [facility]

## 2021-08-19 NOTE — Discharge Summary (Addendum)
Physician Discharge Summary  Darren Pearson QPR:[id number] DOB: [birth year] DOA: 08/14/2021  PCP: Thompson Grayer, MD  Admit date: 08/14/2021 Discharge date: 08/19/2021  Admitted From: SNF.  Disposition:  SNf.  Recommendations for Outpatient Follow-up:  Follow up with PCP in 1-2 weeks Please obtain BMP/CBC in one week Please follow up with PCCM for CT of the chest and possibly PET scan  in 4 weeks.  Please follow up with ENT for removal of the nasal packing.     Discharge Condition:STABLE.  CODE STATUS:FULL CODE.  Diet recommendation: Heart Healthy    Brief/Interim Summary: 55 year old gentleman with prior history of dementia, CVA, hypertension, asthma, stage II CKD, hepatitis C was brought into ED for evaluation of hemoptysis.  Sister over the phone reports that patient had a severe episode of coughing and had phlegm with some blood in it.  Patient at bedside appears comfortable, currently on 2 L of nasal cannula oxygen to keep sats greater than 90%. He was admitted for evaluation and management of aspiration pneumonia. CT angiogram of the chest shows pneumonia in the right middle and right lower lobe concerning for aspiration.  Discharge Diagnoses:  Active Problems:   History of CVA (cerebrovascular accident)   Sepsis due to pneumonia ([place])   Aspiration pneumonia ([place])   HLD (hyperlipidemia)   HTN (hypertension)   Hemoptysis  Sepsis secondary to right middle and right lower lobe pneumonia CT of the chest concerning for aspiration pneumonia.  Recommend repeat imaging following resolution of symptoms given underlying mass not excluded on CT in 4 weeks. Initially started on IV Rocephin and Zithromax and transitioned to Unasyn to augmentin.  Strep pneumonia is negative, COVID 19 negative. Blood cultures not done.  Legionella antigen is negative.  Sputum culture shows normal fora Nasal cannula oxygen to keep sats greater than 90% SLP evaluation to evaluate for aspiration cleared him  for regular diet.  Therapy evaluations ordered recommending SNF.    Hemoptysis:  Recurrent, last episode yesterday afternoon. None ths am.  PCCM consulted, for evaluation of bronchoscopy.  Recommended outpatient follow up CT / PET scan for further evaluation in 4 weeks.  Discontinued lovenox and aspirin.  Probably nose bleeding, ENT consulted and he underwent packing . Recommend outpatient follow upw ith ENT for removal of packing.  No bleeding overnight.      Essential hypertension  Well controlled.  D.c lisinopril for persistent coughing.  Add low dose amlodipine.      History of CVA Aspirin held for hemoptysis initially , to be resume on dischared Therapy evaluations ordered for SNF.      History of dementia no behavioral abnormalities Patient oriented to self and place only.     Mild anemia/anemia of chronic disease/normocytic anemia Patient's hemoglobin is better than in February 2022 Continue to monitor     Nasal bleeding:  Posteriorly, probably contributing to the hemoptysis and malena and blood in the stools.  ENT consulted.  Packing done, no bleeding overnight.       Hypokalemia:  Replaced.       Discharge Instructions  Discharge Instructions     Diet - low sodium heart healthy   Complete by: As directed    Discharge instructions   Complete by: As directed    Please follow up with PCP in one week.  Please follow up with ENT for removal of the nasal packing.      Allergies as of 08/19/2021       Reactions   Sulfa Antibiotics Hives  Medication List     STOP taking these medications    azithromycin 500 MG tablet Commonly known as: ZITHROMAX   lisinopril 20 MG tablet Commonly known as: ZESTRIL       TAKE these medications    amLODipine 5 MG tablet Commonly known as: NORVASC Take 1 tablet (5 mg total) by mouth daily.   amoxicillin-clavulanate 875-125 MG tablet Commonly known as: AUGMENTIN Take 1 tablet by mouth every 12  (twelve) hours for 2 days.   aspirin 81 MG EC tablet Take 1 tablet (81 mg total) by mouth daily.   atorvastatin 40 MG tablet Commonly known as: LIPITOR Take 1 tablet (40 mg total) by mouth daily at 6 PM.   cyanocobalamin 1000 MCG tablet Take 1 tablet (1,000 mcg total) by mouth daily.   ferrous sulfate 325 (65 FE) MG tablet Take 325 mg by mouth daily with breakfast.   loratadine 10 MG tablet Commonly known as: CLARITIN Take 1 tablet (10 mg total) by mouth daily.   oxymetazoline 0.05 % nasal spray Commonly known as: AFRIN Place 1 spray into both nostrils 2 (two) times daily.   polyethylene glycol powder 17 GM/SCOOP powder Commonly known as: GLYCOLAX/MIRALAX TAKE 17 G BY MOUTH DAILY AS NEEDED FOR MILD CONSTIPATION. What changed:  how much to take how to take this when to take this reasons to take this Another medication with the same name was removed. Continue taking this medication, and follow the directions you see here.   sertraline 50 MG tablet Commonly known as: ZOLOFT Take 50 mg by mouth daily.        Allergies  Allergen Reactions   Sulfa Antibiotics Hives    Consultations: ENT PCCM.    Procedures/Studies: CT Angio Chest PE W and/or Wo Contrast  Result Date: 08/14/2021 CLINICAL DATA:  Hemoptysis. EXAM: CT ANGIOGRAPHY CHEST WITH CONTRAST TECHNIQUE: Multidetector CT imaging of the chest was performed using the standard protocol during bolus administration of intravenous contrast. Multiplanar CT image reconstructions and MIPs were obtained to evaluate the vascular anatomy. CONTRAST:  15mL OMNIPAQUE IOHEXOL 350 MG/ML SOLN COMPARISON:  Chest radiograph dated 01/08/2021. FINDINGS: Cardiovascular: No cardiomegaly or pericardial effusion. The thoracic aorta is unremarkable. Evaluation of the pulmonary arteries is very limited due to respiratory motion artifact and suboptimal opacification and timing of the contrast. No large or central pulmonary artery embolus  identified. Mediastinum/Nodes: Right hilar adenopathy measures 2.5 x 2.4 cm. The esophagus is grossly unremarkable. No mediastinal fluid collection. Lungs/Pleura: Background of centrilobular emphysema with large biapical subpleural blebs. There is consolidative changes of the right middle lobe with volume loss and areas of small cystic changes. Although findings may represent atelectasis or pneumonia underlying mass is not excluded. Follow-up to resolution is recommended. There is patchy area of nodular density in the right lower lobe consistent with pneumonia. Aspiration is not excluded. Mucus secretions noted in the trachea and mainstem bronchi. The central airways remain patent. No pleural effusion or pneumothorax. Upper Abdomen: No acute abnormality. Musculoskeletal: No chest wall abnormality. No acute or significant osseous findings. Review of the MIP images confirms the above findings. IMPRESSION: 1. No CT evidence of central pulmonary artery embolus. 2. Right middle lobe atelectasis or pneumonia. Underlying mass is not excluded. Follow-up to resolution is recommended. 3. Patchy area of nodular density in the right lower lobe consistent with pneumonia. Aspiration is not excluded. 4. Right hilar adenopathy, likely reactive. 5. Emphysema (ICD10-J43.9). Electronically Signed   By: Anner Crete M.D.   On: 08/14/2021 19:59  Subjective: No new complaints.   Discharge Exam: Vitals:   08/18/21 2022 08/19/21 0440  BP: 119/79 101/72  Pulse: 71 63  Resp: 14 14  Temp: 97.8 F (36.6 C) (!) 97.5 F (36.4 C)  SpO2: 97% 97%   Vitals:   08/18/21 1604 08/18/21 1828 08/18/21 2022 08/19/21 0440  BP: 128/83 114/82 119/79 101/72  Pulse:  72 71 63  Resp:  15 14 14   Temp:   97.8 F (36.6 C) (!) 97.5 F (36.4 C)  TempSrc:   Oral Oral  SpO2:  98% 97% 97%  Weight:      Height:        General: Pt is alert, awake, not in acute distress Cardiovascular: RRR, S1/S2 +, no rubs, no gallops Respiratory:  CTA bilaterally, no wheezing, no rhonchi Abdominal: Soft, NT, ND, bowel sounds + Extremities: no edema, no cyanosis    The results of significant diagnostics from this hospitalization (including imaging, microbiology, ancillary and laboratory) are listed below for reference.     Microbiology: Recent Results (from the past 240 hour(s))  SARS CORONAVIRUS 2 (TAT 6-24 HRS) Nasopharyngeal Nasopharyngeal Swab     Status: None   Collection Time: 08/14/21  9:17 PM   Specimen: Nasopharyngeal Swab  Result Value Ref Range Status   SARS Coronavirus 2 NEGATIVE NEGATIVE Final    Comment: (NOTE) SARS-CoV-2 target nucleic acids are NOT DETECTED.  The SARS-CoV-2 RNA is generally detectable in upper and lower respiratory specimens during the acute phase of infection. Negative results do not preclude SARS-CoV-2 infection, do not rule out co-infections with other pathogens, and should not be used as the sole basis for treatment or other patient management decisions. Negative results must be combined with clinical observations, patient history, and epidemiological information. The expected result is Negative.  Fact Sheet for Patients: SugarRoll.be  Fact Sheet for Healthcare Providers: https://www.woods-mathews.com/  This test is not yet approved or cleared by the [place] FDA and  has been authorized for detection and/or diagnosis of SARS-CoV-2 by FDA under an Emergency Use Authorization (EUA). This EUA will remain  in effect (meaning this test can be used) for the duration of the COVID-19 declaration under Se ction 564(b)(1) of the Act, 21 U.S.C. section 360bbb-3(b)(1), unless the authorization is terminated or revoked sooner.  Performed at [facility], [place] [address], [place], [place] [address]   Expectorated Sputum Assessment w Gram Stain, Rflx to Resp Cult     Status: None   Collection Time: 08/15/21  6:47 AM   Specimen: Expectorated  Sputum  Result Value Ref Range Status   Specimen Description EXPECTORATED SPUTUM  Final   Special Requests NONE  Final   Sputum evaluation   Final    THIS SPECIMEN IS ACCEPTABLE FOR SPUTUM CULTURE Performed at [facility], [place] [address] [place], [place] [address]    Report Status 08/15/2021 FINAL  Final  Culture, Respiratory w Gram Stain     Status: None   Collection Time: 08/15/21  6:47 AM  Result Value Ref Range Status   Specimen Description   Final    EXPECTORATED SPUTUM Performed at [facility], [place] [address] [place], [place] [address]    Special Requests   Final    NONE Reflexed from [id number] Performed at [facility], [place] [address], [place], [place] [address]    Gram Stain   Final    RARE WBC PRESENT, PREDOMINANTLY MONONUCLEAR RARE GRAM POSITIVE COCCI RARE GRAM NEGATIVE  RODS    Culture   Final    FEW Consistent with normal respiratory flora. No Pseudomonas species isolated Performed at [place] [address] [place], [place] [address]    Report Status 08/17/2021 FINAL  Final  SARS CORONAVIRUS 2 (TAT 6-24 HRS) Nasopharyngeal Nasopharyngeal Swab     Status: None   Collection Time: 08/18/21  3:56 PM   Specimen: Nasopharyngeal Swab  Result Value Ref Range Status   SARS Coronavirus 2 NEGATIVE NEGATIVE Final    Comment: (NOTE) SARS-CoV-2 target nucleic acids are NOT DETECTED.  The SARS-CoV-2 RNA is generally detectable in upper and lower respiratory specimens during the acute phase of infection. Negative results do not preclude SARS-CoV-2 infection, do not rule out co-infections with other pathogens, and should not be used as the sole basis for treatment or other patient management decisions. Negative results must be combined with clinical observations, patient history, and epidemiological information. The expected result is Negative.  Fact Sheet for  Patients: SugarRoll.be  Fact Sheet for Healthcare Providers: https://www.woods-mathews.com/  This test is not yet approved or cleared by the [place] FDA and  has been authorized for detection and/or diagnosis of SARS-CoV-2 by FDA under an Emergency Use Authorization (EUA). This EUA will remain  in effect (meaning this test can be used) for the duration of the COVID-19 declaration under Se ction 564(b)(1) of the Act, 21 U.S.C. section 360bbb-3(b)(1), unless the authorization is terminated or revoked sooner.  Performed at [facility], [place] [address], [place], [place] [address]      Labs: BNP (last 3 results) No results for input(s): BNP in the last 8760 hours. Basic Metabolic Panel: Recent Labs  Lab 08/14/21 1642 08/17/21 0548 08/18/21 1132  NA 138 137 138  K 3.5 3.4* 3.9  CL 103 104 105  CO2 25 23 24   GLUCOSE 88 90 87  BUN 19 13 15   CREATININE 0.93 0.78 1.00  CALCIUM 8.9 8.8* 8.9   Liver Function Tests: Recent Labs  Lab 08/14/21 1642  AST 14*  ALT 10  ALKPHOS 96  BILITOT 1.3*  PROT 8.4*  ALBUMIN 3.6   No results for input(s): LIPASE, AMYLASE in the last 168 hours. No results for input(s): AMMONIA in the last 168 hours. CBC: Recent Labs  Lab 08/14/21 1642 08/15/21 0523 08/17/21 0548 08/18/21 1130  WBC 7.5 5.8 5.8 3.8*  NEUTROABS 4.6  --   --   --   HGB 11.9* 11.7* 11.2* 11.6*  HCT 36.6* 35.7* 33.2* 35.1*  MCV 89.1 88.4 87.4 87.8  PLT 249 241 252 278   Cardiac Enzymes: No results for input(s): CKTOTAL, CKMB, CKMBINDEX, TROPONINI in the last 168 hours. BNP: Invalid input(s): POCBNP CBG: No results for input(s): GLUCAP in the last 168 hours. D-Dimer No results for input(s): DDIMER in the last 72 hours. Hgb A1c No results for input(s): HGBA1C in the last 72 hours. Lipid Profile No results for input(s): CHOL, HDL, LDLCALC, TRIG, CHOLHDL, LDLDIRECT in the last 72 hours. Thyroid function  studies No results for input(s): TSH, T4TOTAL, T3FREE, THYROIDAB in the last 72 hours.  Invalid input(s): FREET3 Anemia work up No results for input(s): VITAMINB12, FOLATE, FERRITIN, TIBC, IRON, RETICCTPCT in the last 72 hours. Urinalysis    Component Value Date/Time   COLORURINE YELLOW 01/06/2021 0450   APPEARANCEUR CLEAR 01/06/2021 0450   LABSPEC 1.025 01/06/2021 0450   PHURINE 6.0 01/06/2021 0450   GLUCOSEU NEGATIVE 01/06/2021 0450   HGBUR MODERATE (A) 01/06/2021  [place] 01/06/2021 0450   BILIRUBINUR neg 05/30/2014 [place] 01/06/2021 0450   PROTEINUR 30 (A) 01/06/2021 0450   UROBILINOGEN >=8.0 05/30/2014 1538   NITRITE NEGATIVE 01/06/2021 0450   LEUKOCYTESUR NEGATIVE 01/06/2021 0450   Sepsis Labs Invalid input(s): PROCALCITONIN,  WBC,  LACTICIDVEN Microbiology Recent Results (from the past 240 hour(s))  SARS CORONAVIRUS 2 (TAT 6-24 HRS) Nasopharyngeal Nasopharyngeal Swab     Status: None   Collection Time: 08/14/21  9:17 PM   Specimen: Nasopharyngeal Swab  Result Value Ref Range Status   SARS Coronavirus 2 NEGATIVE NEGATIVE Final    Comment: (NOTE) SARS-CoV-2 target nucleic acids are NOT DETECTED.  The SARS-CoV-2 RNA is generally detectable in upper and lower respiratory specimens during the acute phase of infection. Negative results do not preclude SARS-CoV-2 infection, do not rule out co-infections with other pathogens, and should not be used as the sole basis for treatment or other patient management decisions. Negative results must be combined with clinical observations, patient history, and epidemiological information. The expected result is Negative.  Fact Sheet for Patients: SugarRoll.be  Fact Sheet for Healthcare Providers: https://www.woods-mathews.com/  This test is not yet approved or cleared by the [place] FDA and  has been authorized for detection and/or diagnosis of  SARS-CoV-2 by FDA under an Emergency Use Authorization (EUA). This EUA will remain  in effect (meaning this test can be used) for the duration of the COVID-19 declaration under Se ction 564(b)(1) of the Act, 21 U.S.C. section 360bbb-3(b)(1), unless the authorization is terminated or revoked sooner.  Performed at [facility], [place] [address], [place], [place] [address]   Expectorated Sputum Assessment w Gram Stain, Rflx to Resp Cult     Status: None   Collection Time: 08/15/21  6:47 AM   Specimen: Expectorated Sputum  Result Value Ref Range Status   Specimen Description EXPECTORATED SPUTUM  Final   Special Requests NONE  Final   Sputum evaluation   Final    THIS SPECIMEN IS ACCEPTABLE FOR SPUTUM CULTURE Performed at [facility], [place] [address], [place], [place] [address]    Report Status 08/15/2021 FINAL  Final  Culture, Respiratory w Gram Stain     Status: None   Collection Time: 08/15/21  6:47 AM  Result Value Ref Range Status   Specimen Description   Final    EXPECTORATED SPUTUM Performed at [facility], [place] [address] [place], [place] [address]    Special Requests   Final    NONE Reflexed from [id number] Performed at [facility], [place] [address], [place], [place] [address]    Gram Stain   Final    RARE WBC PRESENT, PREDOMINANTLY MONONUCLEAR RARE GRAM POSITIVE COCCI RARE GRAM NEGATIVE RODS    Culture   Final    FEW Consistent with normal respiratory flora. No Pseudomonas species isolated Performed at [place] [address] [place], [place] [address]    Report Status 08/17/2021 FINAL  Final  SARS CORONAVIRUS 2 (TAT 6-24 HRS) Nasopharyngeal Nasopharyngeal Swab     Status: None   Collection Time: 08/18/21  3:56 PM   Specimen: Nasopharyngeal Swab  Result Value Ref Range Status   SARS Coronavirus 2 NEGATIVE NEGATIVE Final    Comment: (NOTE) SARS-CoV-2 target nucleic acids are NOT DETECTED.  The  SARS-CoV-2 RNA is generally detectable in upper and lower respiratory specimens during the acute phase of infection. Negative results do  not preclude SARS-CoV-2 infection, do not rule out co-infections with other pathogens, and should not be used as the sole basis for treatment or other patient management decisions. Negative results must be combined with clinical observations, patient history, and epidemiological information. The expected result is Negative.  Fact Sheet for Patients: SugarRoll.be  Fact Sheet for Healthcare Providers: https://www.woods-mathews.com/  This test is not yet approved or cleared by the [place] FDA and  has been authorized for detection and/or diagnosis of SARS-CoV-2 by FDA under an Emergency Use Authorization (EUA). This EUA will remain  in effect (meaning this test can be used) for the duration of the COVID-19 declaration under Se ction 564(b)(1) of the Act, 21 U.S.C. section 360bbb-3(b)(1), unless the authorization is terminated or revoked sooner.  Performed at [facility], [place] [address] [place], [place] [address]      Time coordinating discharge: 36 minutes.  SIGNED:   Hosie Poisson, MD  [facility]

## 2021-08-19 NOTE — Progress Notes (Addendum)
[place] called @ 6568, no one answered. Will try again in a little while.

## 2021-08-19 NOTE — TOC Transition Note (Signed)
Transition of Care [facility]) - CM/SW Discharge Note   Patient Details  Name: Darren Pearson MRN: [id number] Date of Birth: [birth year]   Transition of Care [facility]) CM/SW Contact:  Lynnell Catalan, RN Phone Number: 08/19/2021, 1:50 PM   Clinical Narrative:     Pt to dc back to [facility] today. RN to call report to [phone]. [facility] contacted for transport.  Final next level of care: Long Term Nursing Home Barriers to Discharge: Continued Medical Work up     Discharge Plan and Services   Discharge Planning Services: CM Consult               Readmission Risk Interventions Readmission Risk Prevention Plan 01/07/2021  Transportation Screening Complete  Home Care Screening Complete  Medication Review (RN CM) Complete  Some recent data might be hidden

## 2021-08-20 NOTE — Telephone Encounter (Signed)
I called and spoke with the pt's sister [facility])  She states aware of HFU with [place] 09/19/21 and that someone will call to set up super D CT to be done prior  She was given our address and phone number  Nothing further needed

## 2021-08-26 ENCOUNTER — Other Ambulatory Visit: Payer: Self-pay

## 2021-09-18 ENCOUNTER — Ambulatory Visit (HOSPITAL_COMMUNITY): Payer: Medicaid Other

## 2021-09-19 ENCOUNTER — Ambulatory Visit (INDEPENDENT_AMBULATORY_CARE_PROVIDER_SITE_OTHER): Payer: Medicaid Other | Admitting: Pulmonary Disease

## 2021-09-19 ENCOUNTER — Encounter: Payer: Self-pay | Admitting: Pulmonary Disease

## 2021-09-19 ENCOUNTER — Other Ambulatory Visit: Payer: Self-pay

## 2021-09-19 ENCOUNTER — Ambulatory Visit (INDEPENDENT_AMBULATORY_CARE_PROVIDER_SITE_OTHER): Payer: Medicaid Other

## 2021-09-19 VITALS — BP 138/84 | HR 81 | Ht 69.0 in | Wt 157.8 lb

## 2021-09-19 DIAGNOSIS — R918 Other nonspecific abnormal finding of lung field: Secondary | ICD-10-CM

## 2021-09-19 DIAGNOSIS — F17201 Nicotine dependence, unspecified, in remission: Secondary | ICD-10-CM

## 2021-09-19 NOTE — Progress Notes (Signed)
@Patient  ID: Darren Pearson, male    DOB: 02-25-1966, 55 y.o.   MRN: [id number]  Chief Complaint  Patient presents with   Consult    HFU. States he has been doing well since being home from hospital.     Referring provider: Thompson Grayer, MD  HPI:   55 y.o. man whom we are seeing in consultation for evaluation of abnormal chest imaging.  H&P from recent admission reviewed.  Discharge summary from recent admission reviewed.  Pulmonary note x4 from recent admission reviewed.  Patient has history of pneumonia.  12/2020 with aspiration pneumonia that was presumed.  Treated with antibiotics.  He was discharged home or to facility.  He returned 07/2021.  Cough, fever, dyspnea.  No leukocytosis.  Chest x-ray at admission 07/2021 demonstrated persistent right middle lobe collapse/infiltrate when compared to my review of chest x-ray 12/2020 and interpretation of chest x-ray 12/2020.  Reviewed chest x-ray 2019 and did not have this finding on my interpretation.  CT chest was obtained that showed right middle lobe collapse with air-fluid level or necrosis, difficult to distinguish, on my interpretation.  Also with scattered groundglass in the lower lobes.  Concern for aspiration.  However right middle lobe a bit unusual.  Today he states symptoms are better.  Denies shortness of breath.  Denies cough.  He is essentially nonverbal.  This is a result of stroke per sister at bedside.  He lives in facility.  Ambulates short distances but often in wheelchair.  Does not speak much.  CT scan was scheduled for 09/18/2021 to follow-up on imaging described above but he did not show up for this.  He and sister were not aware of this appointment.  Likely miscommunication between scheduling and the facility where he lives.  PMH: Multiple CVAs, hypertension Surgical history: Reviewed with patient and sister, limited history but denies any Family history: Reviewed, limited by history, mother with stomach cancer, father with  diabetes Social history: Lives in facility, in [place], former smoker, quit 2021   Questionaires / Pulmonary Flowsheets:   ACT:  No flowsheet data found.  MMRC: No flowsheet data found.  Epworth:  No flowsheet data found.  Tests:   FENO:  No results found for: NITRICOXIDE  PFT: No flowsheet data found.  WALK:  No flowsheet data found.  Imaging: Personally reviewed and as per EMR discussion this note  Lab Results: Personally reviewed CBC    Component Value Date/Time   WBC 3.8 (L) 08/18/2021 1130   RBC 4.00 (L) 08/18/2021 1130   HGB 11.2 (L) 08/19/2021 1026   HCT 33.8 (L) 08/19/2021 1026   PLT 278 08/18/2021 1130   MCV 87.8 08/18/2021 1130   MCH 29.0 08/18/2021 1130   MCHC 33.0 08/18/2021 1130   RDW 13.6 08/18/2021 1130   LYMPHSABS 2.2 08/14/2021 1642   MONOABS 0.6 08/14/2021 1642   EOSABS 0.1 08/14/2021 1642   BASOSABS 0.0 08/14/2021 1642    BMET    Component Value Date/Time   NA 138 08/18/2021 1132   K 3.9 08/18/2021 1132   CL 105 08/18/2021 1132   CO2 24 08/18/2021 1132   GLUCOSE 87 08/18/2021 1132   BUN 15 08/18/2021 1132   CREATININE 1.00 08/18/2021 1132   CREATININE 0.91 05/30/2014 1529   CALCIUM 8.9 08/18/2021 1132   GFRNONAA >60 08/18/2021 1132   GFRAA >60 05/23/2020 0330    BNP No results found for: BNP  ProBNP No results found for: PROBNP  Specialty Problems  Pulmonary Problems   Allergic rhinitis   Sepsis due to pneumonia ([place])   Pneumonia   Aspiration pneumonia ([place])   Hemoptysis    Allergies  Allergen Reactions   Sulfa Antibiotics Hives    Immunization History  Administered Date(s) Administered   Influenza-Unspecified 08/24/2012   PFIZER(Purple Top)SARS-COV-2 Vaccination 02/15/2020, 03/14/2020   PPD Test 05/30/2014   Tdap 04/09/2020    Past Medical History:  Diagnosis Date   Allergy    Anemia    Anxiety and depression    Asthma    CVA (cerebral vascular accident) ([place])    Hepatitis C    History  of chicken pox    Hypertension     Tobacco History: Social History   Tobacco Use  Smoking Status Former   Packs/day: 0.25   Types: Cigarettes   Start date: 11/24/2012  Smokeless Tobacco Never  Tobacco Comments   has not smoked since 11/20/2019   Counseling given: Not Answered Tobacco comments: has not smoked since 11/20/2019   Continue to not smoke  Outpatient Encounter Medications as of 09/19/2021  Medication Sig   amLODipine (NORVASC) 5 MG tablet Take 1 tablet (5 mg total) by mouth daily.   aspirin 81 MG EC tablet Take 1 tablet (81 mg total) by mouth daily.   atorvastatin (LIPITOR) 40 MG tablet Take 1 tablet (40 mg total) by mouth daily at 6 PM.   cyanocobalamin 1000 MCG tablet Take 1 tablet (1,000 mcg total) by mouth daily.   ferrous sulfate 325 (65 FE) MG tablet Take 325 mg by mouth daily with breakfast.   loratadine (CLARITIN) 10 MG tablet Take 1 tablet (10 mg total) by mouth daily.   oxymetazoline (AFRIN) 0.05 % nasal spray Place 1 spray into both nostrils 2 (two) times daily.   polyethylene glycol powder (GLYCOLAX/MIRALAX) 17 GM/SCOOP powder TAKE 17 G BY MOUTH DAILY AS NEEDED FOR MILD CONSTIPATION. (Patient taking differently: Take 17 g by mouth daily as needed for moderate constipation or mild constipation.)   sertraline (ZOLOFT) 50 MG tablet Take 50 mg by mouth daily.   No facility-administered encounter medications on file as of 09/19/2021.     Review of Systems  Review of Systems  Unable to obtain due to patient factors, essentially nonverbal state. Physical Exam  BP 138/84   Pulse 81   Ht 5\' 9"  (1.753 m)   Wt 157 lb 12.8 oz (71.6 kg)   SpO2 99% Comment: on RA  BMI 23.30 kg/m   Wt Readings from Last 5 Encounters:  09/19/21 157 lb 12.8 oz (71.6 kg)  08/14/21 152 lb 1.9 oz (69 kg)  01/08/21 147 lb 14.9 oz (67.1 kg)  07/04/20 152 lb 9.6 oz (69.2 kg)  06/27/20 153 lb (69.4 kg)    BMI Readings from Last 5 Encounters:  09/19/21 23.30 kg/m  08/14/21  22.46 kg/m  01/08/21 21.85 kg/m  07/04/20 22.54 kg/m  06/27/20 22.59 kg/m     Physical Exam General: Chronically ill-appearing, no distress Eyes: EOMI, icterus Neck: Supple, no JVP Lungs: Diminished, clear Cardiovascular: Regular rhythm, no murmurs appreciated Abdomen: Nondistended, bowel sounds present MSK: No synovitis, no joint effusion Neuro: Normal gait, no weakness Psych: Depressed mood, flat affect    Assessment & Plan:   Right middle lobe atelectasis/infiltrate: Persistent since 12/2020.  CT scan 07/2021 with what appears to be air-fluid level radionecrosis.  Favor infectious etiology.  However, malignancy considered.  Chest x-ray today.  Repeat CT scan not done.  Asked to be rescheduled.  Consider  PET scan if still present.  Overall patient has poor candidate for treatment if malignancy present given his history of strokes, essentially nonverbal state, poor functional status.  Tobacco abuse in remission: Last smoked 1 year ago.  Congratulated on this.   Return if symptoms worsen or fail to improve.   Lanier Clam, MD 09/19/2021   This appointment required 60 minutes of patient care (this includes precharting, chart review, review of results, face-to-face care, etc.).

## 2021-09-19 NOTE — Patient Instructions (Addendum)
Nice to meet you  We will get a chest x-ray today  I will call and see about getting her CT scan rescheduled in the coming days.  We will call your sister to help arrange.  Based on results we will talk next steps that need to be taken if any.  Return to clinic in follow-up based on CT results

## 2021-10-01 ENCOUNTER — Ambulatory Visit (HOSPITAL_COMMUNITY)
Admission: RE | Admit: 2021-10-01 | Discharge: 2021-10-01 | Disposition: A | Discharge status: Home / Self Care | Payer: Medicaid Other | Source: Ambulatory Visit | Attending: Pulmonary Disease | Admitting: Pulmonary Disease

## 2021-10-01 DIAGNOSIS — J449 Chronic obstructive pulmonary disease, unspecified: Secondary | ICD-10-CM | POA: Insufficient documentation

## 2021-12-06 ENCOUNTER — Emergency Department (HOSPITAL_COMMUNITY)
Admission: EM | Admit: 2021-12-06 | Discharge: 2021-12-07 | Disposition: A | Discharge status: Home / Self Care | Payer: Medicaid Other | Attending: Emergency Medicine | Admitting: Emergency Medicine

## 2021-12-06 ENCOUNTER — Encounter (HOSPITAL_COMMUNITY): Payer: Self-pay

## 2021-12-06 ENCOUNTER — Emergency Department (HOSPITAL_COMMUNITY): Payer: Medicaid Other

## 2021-12-06 ENCOUNTER — Other Ambulatory Visit: Payer: Self-pay

## 2021-12-06 DIAGNOSIS — W19XXXA Unspecified fall, initial encounter: Secondary | ICD-10-CM

## 2021-12-06 DIAGNOSIS — W010XXA Fall on same level from slipping, tripping and stumbling without subsequent striking against object, initial encounter: Secondary | ICD-10-CM | POA: Diagnosis not present

## 2021-12-06 DIAGNOSIS — Z7982 Long term (current) use of aspirin: Secondary | ICD-10-CM | POA: Diagnosis not present

## 2021-12-06 DIAGNOSIS — R519 Headache, unspecified: Secondary | ICD-10-CM | POA: Insufficient documentation

## 2021-12-06 DIAGNOSIS — Z79899 Other long term (current) drug therapy: Secondary | ICD-10-CM | POA: Insufficient documentation

## 2021-12-06 DIAGNOSIS — Y900 Blood alcohol level of less than 20 mg/100 ml: Secondary | ICD-10-CM | POA: Diagnosis not present

## 2021-12-06 LAB — CBC
HCT: 43 % (ref 39.0–52.0)
Hemoglobin: 14.3 g/dL (ref 13.0–17.0)
MCH: 29.3 pg (ref 26.0–34.0)
MCHC: 33.3 g/dL (ref 30.0–36.0)
MCV: 88.1 fL (ref 80.0–100.0)
Platelets: 243 10*3/uL (ref 150–400)
RBC: 4.88 MIL/uL (ref 4.22–5.81)
RDW: 14.3 % (ref 11.5–15.5)
WBC: 4.4 10*3/uL (ref 4.0–10.5)
nRBC: 0 % (ref 0.0–0.2)

## 2021-12-06 LAB — BASIC METABOLIC PANEL
Anion gap: 8 (ref 5–15)
BUN: 9 mg/dL (ref 6–20)
CO2: 28 mmol/L (ref 22–32)
Calcium: 9 mg/dL (ref 8.9–10.3)
Chloride: 104 mmol/L (ref 98–111)
Creatinine, Ser: 1.03 mg/dL (ref 0.61–1.24)
GFR, Estimated: >60 mL/min (ref >60)
Glucose, Bld: 78 mg/dL (ref 70–99)
Potassium: 3.7 mmol/L (ref 3.5–5.1)
Sodium: 140 mmol/L (ref 135–145)

## 2021-12-06 LAB — ETHANOL: Alcohol, Ethyl (B): <10 mg/dL (ref <10)

## 2021-12-06 NOTE — ED Triage Notes (Signed)
Pt BIB EMS from [facility] after falling from bed onto his left side. His fall was witnessed by his roommate. Pt is not on any blood thinners. Staff at facility noticed an indentation to the R side of his head, but no bruising, swelling, or bleeding is present.   VSS w/ EMS.

## 2021-12-06 NOTE — ED Notes (Signed)
[facility] called for transport, 20 plus people infront of pt

## 2021-12-06 NOTE — ED Provider Notes (Signed)
[facility] EMERGENCY DEPARTMENT Provider Note   CSN: [id number] Arrival date & time: 12/06/21  1636     History  No chief complaint on file.   Darren Pearson is a 56 y.o. male.  Pt is a 56 yo male presenting from nursing home for fall. Pt states he tripped over a hand sanitizer bottle falling forward. Admits to head trauma and pain above right eye brow. Denies bleeding. Denies LOC. Denies blood thinners.   The history is provided by the patient. No language interpreter was used.      Home Medications Prior to Admission medications   Medication Sig Start Date End Date Taking? Authorizing Provider  amLODipine (NORVASC) 5 MG tablet Take 1 tablet (5 mg total) by mouth daily. 08/19/21   Hosie Poisson, MD  aspirin 81 MG EC tablet Take 1 tablet (81 mg total) by mouth daily. 11/24/19   Kathie Dike, MD  atorvastatin (LIPITOR) 40 MG tablet Take 1 tablet (40 mg total) by mouth daily at 6 PM. 11/23/19   Kathie Dike, MD  cyanocobalamin 1000 MCG tablet Take 1 tablet (1,000 mcg total) by mouth daily. 11/24/19   Kathie Dike, MD  ferrous sulfate 325 (65 FE) MG tablet Take 325 mg by mouth daily with breakfast.    [provider]  loratadine (CLARITIN) 10 MG tablet Take 1 tablet (10 mg total) by mouth daily. 08/19/21   Hosie Poisson, MD  oxymetazoline (AFRIN) 0.05 % nasal spray Place 1 spray into both nostrils 2 (two) times daily. 08/19/21   Hosie Poisson, MD  polyethylene glycol powder (GLYCOLAX/MIRALAX) 17 GM/SCOOP powder TAKE 17 G BY MOUTH DAILY AS NEEDED FOR MILD CONSTIPATION. Patient taking differently: Take 17 g by mouth daily as needed for moderate constipation or mild constipation. 01/09/21 01/09/22  Antonieta Pert, MD  sertraline (ZOLOFT) 50 MG tablet Take 50 mg by mouth daily.    [provider]      Allergies    Sulfa antibiotics    Review of Systems   Review of Systems  Constitutional:  Negative for chills and fever.  HENT:  Negative for ear  pain and sore throat.   Eyes:  Negative for pain and visual disturbance.  Respiratory:  Negative for cough and shortness of breath.   Cardiovascular:  Negative for chest pain and palpitations.  Gastrointestinal:  Negative for abdominal pain and vomiting.  Genitourinary:  Negative for dysuria and hematuria.  Musculoskeletal:  Negative for arthralgias and back pain.  Skin:  Positive for wound. Negative for color change and rash.  Neurological:  Negative for seizures and syncope.  All other systems reviewed and are negative.  Physical Exam Updated Vital Signs BP (!) 162/106 (BP Location: Right Arm)    Pulse 77    Temp 98.6 F (37 C) (Oral)    Resp 16    SpO2 100%  Physical Exam Vitals and nursing note reviewed.  Constitutional:      General: He is not in acute distress.    Appearance: He is well-developed.  HENT:     Head: Normocephalic and atraumatic.   Eyes:     Conjunctiva/sclera: Conjunctivae normal.  Cardiovascular:     Rate and Rhythm: Normal rate and regular rhythm.     Heart sounds: No murmur heard. Pulmonary:     Effort: Pulmonary effort is normal. No respiratory distress.     Breath sounds: Normal breath sounds.  Abdominal:     Palpations: Abdomen is soft.     Tenderness:  There is no abdominal tenderness.  Musculoskeletal:        General: No swelling.     Cervical back: Neck supple. No bony tenderness.     Thoracic back: No bony tenderness.     Lumbar back: No bony tenderness.  Skin:    General: Skin is warm and dry.     Capillary Refill: Capillary refill takes less than 2 seconds.  Neurological:     General: No focal deficit present.     Mental Status: He is alert. Mental status is at baseline. He is confused.     GCS: GCS eye subscore is 4. GCS verbal subscore is 5. GCS motor subscore is 6.     Cranial Nerves: Cranial nerves 2-12 are intact.     Sensory: Sensation is intact.     Motor: Motor function is intact.     Coordination: Coordination is intact.      Gait: Gait is intact.     Comments: Confused but baseline per wife at bedside  Psychiatric:        Mood and Affect: Mood normal.    ED Results / Procedures / Treatments   Labs (all labs ordered are listed, but only abnormal results are displayed) Labs Reviewed  CBC  BASIC METABOLIC PANEL    EKG None  Radiology No results found.  Procedures Procedures    Medications Ordered in ED Medications - No data to display  ED Course/ Medical Decision Making/ A&P                           Medical Decision Making  4:55 PM 56 yo male presenting from home for mechanical fall. Pt is Aox3, no acute distress, afebrile, with stable vitals. No neurovascular deficits. Evidence of contusion to right supraorbital ridge. No lacerations. No bleeding. No midline spinal tenderness. No other bony pain complaints. CT head and neck demonstrate no acute process.   Patient in no distress and overall condition improved here in the ED. Fall prevention discussed in detail with patient.  Safe for discharge back to nursing home. Detailed discussions were had with the patient regarding current findings, and need for close f/u with PCP or on call doctor. The patient has been instructed to return immediately if the symptoms worsen in any way for re-evaluation. Patient verbalized understanding and is in agreement with current care plan. All questions answered prior to discharge.         Final Clinical Impression(s) / ED Diagnoses Final diagnoses:  Fall, initial encounter    Rx / DC Orders ED Discharge Orders     None         Lianne Cure, DO 45/40/98 [phone]

## 2021-12-07 NOTE — ED Notes (Signed)
Patient verbalizes understanding of discharge instructions. Opportunity for questioning and answers were provided. Armband removed by staff, pt discharged from ED. Report to facility called per handoff RN. Pt left via [facility], d/c papers sent along with patient.

## 2021-12-25 ENCOUNTER — Encounter: Payer: Self-pay | Admitting: Student

## 2021-12-25 ENCOUNTER — Ambulatory Visit (INDEPENDENT_AMBULATORY_CARE_PROVIDER_SITE_OTHER): Payer: Medicaid Other | Admitting: Student

## 2021-12-25 ENCOUNTER — Other Ambulatory Visit: Payer: Self-pay

## 2021-12-25 VITALS — BP 124/80 | HR 78 | Ht 70.0 in | Wt 156.4 lb

## 2021-12-25 DIAGNOSIS — W19XXXA Unspecified fall, initial encounter: Secondary | ICD-10-CM

## 2021-12-25 DIAGNOSIS — I639 Cerebral infarction, unspecified: Secondary | ICD-10-CM | POA: Diagnosis not present

## 2021-12-25 LAB — CUP PACEART INCLINIC DEVICE CHECK
Date Time Interrogation Session: 20230201094051
Implantable Pulse Generator Implant Date: 20201229

## 2021-12-25 NOTE — Progress Notes (Signed)
Electrophysiology Office Note Date: 12/25/2021  ID:  MANISH RUGGIERO, DOB 10-20-1966, MRN [id number]  PCP: No primary care provider on file. Primary Cardiologist: None Electrophysiologist: Thompson Grayer, MD   CC: IL[facility] follow-up  Darren Pearson is a 56 y.o. male seen today for Dr. Rayann Heman for post hospital follow up.  It is not clear why post hospital follow up was made, though appears because Dr. Rayann Heman had been placed in his PCP slot in epic, as opposed to consulting physician with Electrophysiology.   He was seen in ED 12/06/2021 for falls.    he presents today for routine electrophysiology followup.  Since last being seen in our clinic, the patient reports doing well overall. Continues to have falls due to weakness and imbalance. Device interrogation without tachycardia or bradycardia. Denies SOB or syncope.   Device History: Medtronic loop recorder implanted 10/2019 for Cryptogenic Stroke  Past Medical History:  Diagnosis Date   Allergy    Anemia    Anxiety and depression    Asthma    CVA (cerebral vascular accident) ([place])    Hepatitis C    History of chicken pox    Hypertension    Past Surgical History:  Procedure Laterality Date   BUBBLE STUDY  11/22/2019   Procedure: BUBBLE STUDY;  Surgeon: Dorothy Spark, MD;  Location: [facility];  Service: Cardiovascular;;   LOOP RECORDER INSERTION N/A 11/22/2019   Procedure: LOOP RECORDER INSERTION;  Surgeon: Thompson Grayer, MD;  Location: [place] [facility];  Service: Cardiovascular;  Laterality: N/A;   TEE WITHOUT CARDIOVERSION N/A 11/22/2019   Procedure: TRANSESOPHAGEAL ECHOCARDIOGRAM (TEE);  Surgeon: Dorothy Spark, MD;  Location: [facility] ENDOSCOPY;  Service: Cardiovascular;  Laterality: N/A;    Current Outpatient Medications  Medication Sig Dispense Refill   amLODipine (NORVASC) 5 MG tablet Take 1 tablet (5 mg total) by mouth daily. 30 tablet 0   aspirin 81 MG EC tablet Take 1 tablet (81 mg total) by mouth daily. 90  tablet 0   atorvastatin (LIPITOR) 40 MG tablet Take 1 tablet (40 mg total) by mouth daily at 6 PM. 90 tablet 0   cyanocobalamin 1000 MCG tablet Take 1 tablet (1,000 mcg total) by mouth daily. 90 tablet 0   ferrous sulfate 325 (65 FE) MG tablet Take 325 mg by mouth daily with breakfast.     loratadine (CLARITIN) 10 MG tablet Take 1 tablet (10 mg total) by mouth daily.     oxymetazoline (AFRIN) 0.05 % nasal spray Place 1 spray into both nostrils 2 (two) times daily. 30 mL 0   polyethylene glycol powder (GLYCOLAX/MIRALAX) 17 GM/SCOOP powder TAKE 17 G BY MOUTH DAILY AS NEEDED FOR MILD CONSTIPATION. (Patient taking differently: Take 17 g by mouth daily as needed for moderate constipation or mild constipation.) 14 g 0   sertraline (ZOLOFT) 50 MG tablet Take 50 mg by mouth daily.     No current facility-administered medications for this visit.    Allergies:   Sulfa antibiotics   Social History: Social History   Socioeconomic History   Marital status: Single    Spouse name: Not on file   Number of children: Not on file   Years of education: 12   Highest education level: Not on file  Occupational History   Occupation: Shipping Department lead    Employer: [employer]  Tobacco Use   Smoking status: Former    Packs/day: 0.25    Types: Cigarettes    Start date: 11/24/2012  Smokeless tobacco: Never   Tobacco comments:    has not smoked since 11/20/2019  Vaping Use   Vaping Use: Former  Substance and Sexual Activity   Alcohol use: Not Currently    Alcohol/week: 2.0 standard drinks    Types: 2 Cans of beer per week    Comment: social, states stopped on 11/20/2019.   Drug use: Not Currently    Frequency: 5.0 times per week    Types: Marijuana    Comment: Stop 11/20/2019   Sexual activity: Yes    Birth control/protection: Condom  Other Topics Concern   Not on file  Social History Narrative   Regular exercise-yes   Social Determinants of Health   Financial Resource Strain:  Not on file  Food Insecurity: Not on file  Transportation Needs: Not on file  Physical Activity: Not on file  Stress: Not on file  Social Connections: Not on file  Intimate Partner Violence: Not on file    Family History: Family History  Problem Relation Age of Onset   Stomach cancer Mother 4   Diabetes Father    Paget's disease of bone Father    Colon cancer Neg Hx    Esophageal cancer Neg Hx    Pancreatic cancer Neg Hx    Liver disease Neg Hx      Review of Systems: All other systems reviewed and are otherwise negative except as noted above.  Physical Exam: There were no vitals filed for this visit.   GEN- The patient is well appearing, alert and oriented x 3 today.   HEENT: normocephalic, atraumatic; sclera clear, conjunctiva pink; hearing intact; oropharynx clear; neck supple  Lungs- Clear to ausculation bilaterally, normal work of breathing.  No wheezes, rales, rhonchi Heart- Regular rate and rhythm, no murmurs, rubs or gallops  GI- soft, non-tender, non-distended, bowel sounds present  Extremities- no clubbing, cyanosis, or edema  MS- no significant deformity or atrophy Skin- warm and dry, no rash or lesion; PPM pocket well healed Psych- euthymic mood, full affect Neuro- strength and sensation are intact  PPM Interrogation- reviewed in detail today,  See PACEART report  EKG:  EKG is not ordered today. The ekg ordered 12/06/2021 shows NSR at 79 bpm  Recent Labs: 08/14/2021: ALT 10 12/06/2021: BUN 9; Creatinine, Ser 1.03; Hemoglobin 14.3; Platelets 243; Potassium 3.7; Sodium 140   Wt Readings from Last 3 Encounters:  12/06/21 150 lb (68 kg)  09/19/21 157 lb 12.8 oz (71.6 kg)  08/14/21 152 lb 1.9 oz (69 kg)     Other studies Reviewed: Additional studies/ records that were reviewed today include: Echo 04/2020 shows LVEF 50-55%, Previous remote checks, Most recent labwork.   Assessment and Plan:  1. Cryptogenic Stroke s/p Medtronic Loop recorder Monitor last  connected 06/03/2021. Family says it was unplugged at facility. Plug back in. Call if issues or if it is lost.  Device check today shows no arrhythmias.   No changes today  2. Falls Discussed general fall prevention including using a 4-pronged cane or walker for unsteadiness, removing loose rugs, and keeping a nightlight between the bed and the bathroom for evenings.  Will need to follow up with PCP for further.   He may continue to follow up with EP in person only as needed.    Jacalyn Lefevre, PA-C  12/25/2021 8:11 AM  [facility] [address] [place] [address] [phone] (office) [phone] (fax)

## 2021-12-25 NOTE — Patient Instructions (Signed)
Medication Instructions:  Your physician recommends that you continue on your current medications as directed. Please refer to the Current Medication list given to you today.  *If you need a refill on your cardiac medications before your next appointment, please call your pharmacy*   Lab Work: None  If you have labs (blood work) drawn today and your tests are completely normal, you will receive your results only by: [place] (if you have MyChart) OR A paper copy in the mail If you have any lab test that is abnormal or we need to change your treatment, we will call you to review the results.   Follow-Up: At [facility], you and your health needs are our priority.  As part of our continuing mission to provide you with exceptional heart care, we have created designated Provider Care Teams.  These Care Teams include your primary Cardiologist (physician) and Advanced Practice Providers (APPs -  Physician Assistants and Nurse Practitioners) who all work together to provide you with the care you need, when you need it.  Your next appointment:   As Needed

## 2022-01-10 ENCOUNTER — Ambulatory Visit (INDEPENDENT_AMBULATORY_CARE_PROVIDER_SITE_OTHER): Payer: Medicaid Other

## 2022-01-10 DIAGNOSIS — I639 Cerebral infarction, unspecified: Secondary | ICD-10-CM

## 2022-01-12 LAB — CUP PACEART REMOTE DEVICE CHECK
Date Time Interrogation Session: 20230217230445
Implantable Pulse Generator Implant Date: 20201229

## 2022-01-16 NOTE — Progress Notes (Signed)
Carelink Summary Report / Loop Recorder 

## 2022-02-12 ENCOUNTER — Ambulatory Visit (INDEPENDENT_AMBULATORY_CARE_PROVIDER_SITE_OTHER): Payer: Medicaid Other

## 2022-02-12 DIAGNOSIS — I639 Cerebral infarction, unspecified: Secondary | ICD-10-CM | POA: Diagnosis not present

## 2022-02-13 LAB — CUP PACEART REMOTE DEVICE CHECK
Date Time Interrogation Session: 20230321230956
Implantable Pulse Generator Implant Date: 20201229

## 2022-02-26 NOTE — Progress Notes (Signed)
Carelink Summary Report / Loop Recorder 

## 2022-03-11 ENCOUNTER — Emergency Department (HOSPITAL_COMMUNITY)
Admission: EM | Admit: 2022-03-11 | Discharge: 2022-03-12 | Disposition: A | Discharge status: Skilled Nursing Facility | Payer: Medicaid Other | Attending: Emergency Medicine | Admitting: Emergency Medicine

## 2022-03-11 DIAGNOSIS — R7989 Other specified abnormal findings of blood chemistry: Secondary | ICD-10-CM | POA: Diagnosis not present

## 2022-03-11 DIAGNOSIS — N289 Disorder of kidney and ureter, unspecified: Secondary | ICD-10-CM

## 2022-03-11 DIAGNOSIS — R1013 Epigastric pain: Secondary | ICD-10-CM | POA: Diagnosis not present

## 2022-03-11 DIAGNOSIS — Z7982 Long term (current) use of aspirin: Secondary | ICD-10-CM | POA: Diagnosis not present

## 2022-03-11 DIAGNOSIS — R079 Chest pain, unspecified: Secondary | ICD-10-CM | POA: Insufficient documentation

## 2022-03-11 DIAGNOSIS — R072 Precordial pain: Secondary | ICD-10-CM

## 2022-03-11 NOTE — ED Triage Notes (Signed)
Pt arrived via [facility] for cc of chest pain from [facility]. Pt reporting center chest pain that was not relieved with 3 nitro administered by facility staff. Pt has dementia and is at baseline. EMS arrived continued to endorse center chest pain. EMS placed 20g RAC, administered 324 ASA and one nitro with some relief. ? ?EMS Vitals ? ?124/80 ?106 ?98% RA ?20 ?140 ? ?

## 2022-03-12 ENCOUNTER — Emergency Department (HOSPITAL_COMMUNITY): Payer: Medicaid Other

## 2022-03-12 ENCOUNTER — Other Ambulatory Visit: Payer: Self-pay

## 2022-03-12 ENCOUNTER — Encounter (HOSPITAL_COMMUNITY): Payer: Self-pay | Admitting: Emergency Medicine

## 2022-03-12 LAB — CBC WITH DIFFERENTIAL/PLATELET
Abs Immature Granulocytes: 0.01 10*3/uL (ref 0.00–0.07)
Basophils Absolute: 0 10*3/uL (ref 0.0–0.1)
Basophils Relative: 0 %
Eosinophils Absolute: 0.1 10*3/uL (ref 0.0–0.5)
Eosinophils Relative: 2 %
HCT: 42.7 % (ref 39.0–52.0)
Hemoglobin: 14 g/dL (ref 13.0–17.0)
Immature Granulocytes: 0 %
Lymphocytes Relative: 23 %
Lymphs Abs: 1.7 10*3/uL (ref 0.7–4.0)
MCH: 30 pg (ref 26.0–34.0)
MCHC: 32.8 g/dL (ref 30.0–36.0)
MCV: 91.6 fL (ref 80.0–100.0)
Monocytes Absolute: 0.7 10*3/uL (ref 0.1–1.0)
Monocytes Relative: 9 %
Neutro Abs: 5 10*3/uL (ref 1.7–7.7)
Neutrophils Relative %: 66 %
Platelets: 254 10*3/uL (ref 150–400)
RBC: 4.66 MIL/uL (ref 4.22–5.81)
RDW: 13.4 % (ref 11.5–15.5)
WBC: 7.5 10*3/uL (ref 4.0–10.5)
nRBC: 0 % (ref 0.0–0.2)

## 2022-03-12 LAB — TROPONIN I (HIGH SENSITIVITY)
Troponin I (High Sensitivity): 12 ng/L (ref <18)
Troponin I (High Sensitivity): 13 ng/L (ref <18)

## 2022-03-12 LAB — I-STAT CHEM 8, ED
BUN: 36 mg/dL — ABNORMAL HIGH (ref 6–20)
Calcium, Ion: 1.08 mmol/L — ABNORMAL LOW (ref 1.15–1.40)
Chloride: 105 mmol/L (ref 98–111)
Creatinine, Ser: 1.6 mg/dL — ABNORMAL HIGH (ref 0.61–1.24)
Glucose, Bld: 121 mg/dL — ABNORMAL HIGH (ref 70–99)
HCT: 46 % (ref 39.0–52.0)
Hemoglobin: 15.6 g/dL (ref 13.0–17.0)
Potassium: 4.9 mmol/L (ref 3.5–5.1)
Sodium: 140 mmol/L (ref 135–145)
TCO2: 27 mmol/L (ref 22–32)

## 2022-03-12 MED ORDER — ACETAMINOPHEN 500 MG PO TABS
1000.0000 mg | ORAL_TABLET | Freq: Once | ORAL | Status: DC
  Filled 2022-03-12: qty 2

## 2022-03-12 MED ORDER — SODIUM CHLORIDE 0.9 % IV BOLUS
500.0000 mL | Freq: Once | INTRAVENOUS | Status: AC
  Administered 2022-03-12: 500 mL via INTRAVENOUS

## 2022-03-12 MED ORDER — ALUM & MAG HYDROXIDE-SIMETH 200-200-20 MG/5ML PO SUSP
30.0000 mL | Freq: Once | ORAL | Status: AC
  Administered 2022-03-12: 30 mL via ORAL
  Filled 2022-03-12: qty 30

## 2022-03-12 NOTE — ED Notes (Signed)
Spoke to [employer] at [facility]. Notified RN that pt will be returning to facility and gave report. All questions answered.  ?

## 2022-03-12 NOTE — ED Provider Notes (Signed)
?[facility] ?Provider Note ? ? ?CSN: [id number] ?Arrival date & time: 03/11/22  2340 ? ?  ? ?History ? ?Chief Complaint  ?Patient presents with  ? Chest Pain  ? ? ?[facility] is a 56 y.o. male. ? ?The history is provided by the patient and the EMS personnel. The history is limited by the condition of the patient.  ?Chest Pain ?Pain location:  Epigastric ?Pain quality: not shooting   ?Pain radiates to:  Does not radiate ?Onset quality:  Gradual ?Timing:  Constant ?Progression:  Unchanged ?Chronicity:  Recurrent ?Context: at rest   ?Relieved by:  Nothing ?Worsened by:  Nothing ?Ineffective treatments:  None tried ?Associated symptoms: no cough, no fever, no headache, no shortness of breath and no vomiting   ?Risk factors: male sex   ? ?  ? ?Home Medications ?Prior to Admission medications   ?Medication Sig Start Date End Date Taking? Authorizing Provider  ?amLODipine (NORVASC) 5 MG tablet Take 1 tablet (5 mg total) by mouth daily. 08/19/21   Hosie Poisson, MD  ?aspirin 81 MG EC tablet Take 1 tablet (81 mg total) by mouth daily. 11/24/19   Kathie Dike, MD  ?atorvastatin (LIPITOR) 40 MG tablet Take 1 tablet (40 mg total) by mouth daily at 6 PM. 11/23/19   Kathie Dike, MD  ?cyanocobalamin 1000 MCG tablet Take 1 tablet (1,000 mcg total) by mouth daily. 11/24/19   Kathie Dike, MD  ?ferrous sulfate 325 (65 FE) MG tablet Take 325 mg by mouth daily with breakfast.    [provider]  ?loratadine (CLARITIN) 10 MG tablet Take 1 tablet (10 mg total) by mouth daily. 08/19/21   Hosie Poisson, MD  ?oxymetazoline (AFRIN) 0.05 % nasal spray Place 1 spray into both nostrils 2 (two) times daily. 08/19/21   Hosie Poisson, MD  ?sertraline (ZOLOFT) 50 MG tablet Take 50 mg by mouth daily.    [provider]  ?   ? ?Allergies    ?Sulfa antibiotics   ? ?Review of Systems   ?Review of Systems  ?Constitutional:  Negative for fever.  ?HENT:  Negative for congestion.   ?Eyes:   Negative for redness.  ?Respiratory:  Negative for cough and shortness of breath.   ?Cardiovascular:  Positive for chest pain.  ?Gastrointestinal:  Negative for vomiting.  ?Genitourinary:  Negative for difficulty urinating.  ?Skin:  Negative for rash.  ?Neurological:  Negative for headaches.  ?Psychiatric/Behavioral:  Negative for agitation.   ?All other systems reviewed and are negative. ? ?Physical Exam ?Updated Vital Signs ?BP 131/87 (BP Location: Right Arm)   Pulse 94   Temp 98.8 ?F (37.1 ?C) (Oral)   Resp (!) 21   SpO2 98%  ?Physical Exam ?Vitals and nursing note reviewed. Exam conducted with a chaperone present.  ?Constitutional:   ?   General: He is not in acute distress. ?   Appearance: Normal appearance.  ?HENT:  ?   Head: Normocephalic and atraumatic.  ?   Nose: Nose normal.  ?Eyes:  ?   Conjunctiva/sclera: Conjunctivae normal.  ?   Pupils: Pupils are equal, round, and reactive to light.  ?Cardiovascular:  ?   Rate and Rhythm: Normal rate and regular rhythm.  ?   Pulses: Normal pulses.  ?   Heart sounds: Normal heart sounds.  ?Pulmonary:  ?   Effort: Pulmonary effort is normal.  ?   Breath sounds: Normal breath sounds.  ?Abdominal:  ?   Palpations: Abdomen is soft.  ?  Tenderness: There is no abdominal tenderness. There is no guarding.  ?   Comments: Gassy throughout   ?Musculoskeletal:     ?   General: Normal range of motion.  ?   Cervical back: Normal range of motion and neck supple.  ?Skin: ?   General: Skin is warm and dry.  ?   Capillary Refill: Capillary refill takes less than 2 seconds.  ?Neurological:  ?   General: No focal deficit present.  ?   Mental Status: He is alert.  ?   Deep Tendon Reflexes: Reflexes normal.  ?Psychiatric:     ?   Mood and Affect: Mood normal.     ?   Behavior: Behavior normal.  ? ? ?ED Results / Procedures / Treatments   ?Labs ?(all labs ordered are listed, but only abnormal results are displayed) ?Results for orders placed or performed during the hospital encounter  of 03/11/22  ?CBC with Differential  ?Result Value Ref Range  ? WBC 7.5 4.0 - 10.5 K/uL  ? RBC 4.66 4.22 - 5.81 MIL/uL  ? Hemoglobin 14.0 13.0 - 17.0 g/dL  ? HCT 42.7 39.0 - 52.0 %  ? MCV 91.6 80.0 - 100.0 fL  ? MCH 30.0 26.0 - 34.0 pg  ? MCHC 32.8 30.0 - 36.0 g/dL  ? RDW 13.4 11.5 - 15.5 %  ? Platelets 254 150 - 400 K/uL  ? nRBC 0.0 0.0 - 0.2 %  ? Neutrophils Relative % 66 %  ? Neutro Abs 5.0 1.7 - 7.7 K/uL  ? Lymphocytes Relative 23 %  ? Lymphs Abs 1.7 0.7 - 4.0 K/uL  ? Monocytes Relative 9 %  ? Monocytes Absolute 0.7 0.1 - 1.0 K/uL  ? Eosinophils Relative 2 %  ? Eosinophils Absolute 0.1 0.0 - 0.5 K/uL  ? Basophils Relative 0 %  ? Basophils Absolute 0.0 0.0 - 0.1 K/uL  ? Immature Granulocytes 0 %  ? Abs Immature Granulocytes 0.01 0.00 - 0.07 K/uL  ?I-stat chem 8, ED (not at [facility] or [facility])  ?Result Value Ref Range  ? Sodium 140 135 - 145 mmol/L  ? Potassium 4.9 3.5 - 5.1 mmol/L  ? Chloride 105 98 - 111 mmol/L  ? BUN 36 (H) 6 - 20 mg/dL  ? Creatinine, Ser 1.60 (H) 0.61 - 1.24 mg/dL  ? Glucose, Bld 121 (H) 70 - 99 mg/dL  ? Calcium, Ion 1.08 (L) 1.15 - 1.40 mmol/L  ? TCO2 27 22 - 32 mmol/L  ? Hemoglobin 15.6 13.0 - 17.0 g/dL  ? HCT 46.0 39.0 - 52.0 %  ?Troponin I (High Sensitivity)  ?Result Value Ref Range  ? Troponin I (High Sensitivity) 12 <18 ng/L  ? ?DG Chest Portable 1 View ? ?Result Date: 03/12/2022 ?CLINICAL DATA:  Chest pain. EXAM: PORTABLE CHEST 1 VIEW COMPARISON:  Chest radiograph dated 09/19/2021. FINDINGS: No focal consolidation, pleural effusion, or pneumothorax. Background of emphysema. The cardiac silhouette is within limits. No focal device. No acute osseous pathology. IMPRESSION: 1. No acute cardiopulmonary process. 2. Emphysema. Electronically Signed   By: Anner Crete M.D.   On: 03/12/2022 00:43  ? ?CUP PACEART REMOTE DEVICE CHECK ? ?Result Date: 02/13/2022 ?ILR summary report received. Battery status OK. Normal device function. No new symptom, tachy, brady, or pause episodes. No new AF episodes.  Monthly summary reports and ROV/PRN LA  ? ? ?EKG ?EKG Interpretation ? ?Date/Time:  Wednesday Iaan Oregel 19 2023 00:02:19 EDT ?Ventricular Rate:  93 ?PR Interval:  172 ?QRS Duration: 149 ?QT  Interval:  399 ?QTC Calculation: 497 ?R Axis:   133 ?Text Interpretation: Sinus rhythm RBBB and LPFB Confirmed by Randal Buba, Nilah Belcourt ([id number]) on 03/12/2022 12:06:57 AM ? ?Radiology ?No results found. ? ?Procedures ?Procedures  ? ? ?Medications Ordered in ED ?Medications  ?alum & mag hydroxide-simeth (MAALOX/MYLANTA) 200-200-20 MG/5ML suspension 30 mL (has no administration in time range)  ? ? ?ED Course/ Medical Decision Making/ A&P ?  ?                        ?Medical Decision Making ?EMS reports dull non radiating chest pain in a patient with cognitive impairment. Nitro given by facility without relief of pain  ? ?Amount and/or Complexity of Data Reviewed ?Independent Historian: EMS ?   Details: see above ?External Data Reviewed: labs and notes. ?   Details: previous notes reviewed from cardiology as outpatient, previous kidney function reviewed ?Labs: ordered. ?   Details: all labs reviewed by me: 2 negative troponins 12/13. Normal sodium and potassium, slightly elevated creatinine 1.6, normal white count 7.5, normal hemoglobin 14 ?Radiology: ordered. ?   Details: No acute findings on CXR by me: normal CBC white count is normal at 7.5 normal hemoglobin 14 and normal platelet count; Normal sodium 140 and normal potassium 4.9 slight elevation of creatinine 1.6 ?ECG/medicine tests: ordered and independent interpretation performed. Decision-making details documented in ED Course. ? ?Risk ?OTC drugs. ?Risk Details: Hydrated in the ED given slight elevation of creatinine.  Will also refer to [facility] for ongoing care.  Ruled out for MI in the ED.  Heart score is 2 low risk for MACE.  No pain in the ED.  Stable for discharge with close follow up.   ? ? ? ?Final Clinical Impression(s) / ED Diagnoses ?Final diagnoses:  ?None  ? ?Return  for intractable cough, coughing up blood, fevers > 100.4 unrelieved by medication, shortness of breath, intractable vomiting, chest pain, shortness of breath, weakness, numbness, changes in speech, facial asy

## 2022-03-12 NOTE — ED Notes (Signed)
Called [facility] to transport patient to [facility] ?

## 2022-03-17 ENCOUNTER — Ambulatory Visit (INDEPENDENT_AMBULATORY_CARE_PROVIDER_SITE_OTHER): Payer: Medicaid Other

## 2022-03-17 DIAGNOSIS — I639 Cerebral infarction, unspecified: Secondary | ICD-10-CM

## 2022-03-17 LAB — CUP PACEART REMOTE DEVICE CHECK
Date Time Interrogation Session: 20230423230525
Implantable Pulse Generator Implant Date: 20201229

## 2022-04-02 NOTE — Progress Notes (Signed)
Carelink Summary Report / Loop Recorder 

## 2022-04-22 ENCOUNTER — Ambulatory Visit (INDEPENDENT_AMBULATORY_CARE_PROVIDER_SITE_OTHER): Payer: Medicaid Other

## 2022-04-22 DIAGNOSIS — I639 Cerebral infarction, unspecified: Secondary | ICD-10-CM

## 2022-04-23 LAB — CUP PACEART REMOTE DEVICE CHECK
Date Time Interrogation Session: 20230526230601
Implantable Pulse Generator Implant Date: 20201229

## 2022-04-25 ENCOUNTER — Other Ambulatory Visit: Payer: Self-pay | Admitting: Nephrology

## 2022-04-25 DIAGNOSIS — B1920 Unspecified viral hepatitis C without hepatic coma: Secondary | ICD-10-CM

## 2022-04-25 DIAGNOSIS — E785 Hyperlipidemia, unspecified: Secondary | ICD-10-CM

## 2022-04-25 DIAGNOSIS — I129 Hypertensive chronic kidney disease with stage 1 through stage 4 chronic kidney disease, or unspecified chronic kidney disease: Secondary | ICD-10-CM

## 2022-04-25 DIAGNOSIS — N182 Chronic kidney disease, stage 2 (mild): Secondary | ICD-10-CM

## 2022-05-05 ENCOUNTER — Ambulatory Visit (HOSPITAL_COMMUNITY)
Admission: RE | Admit: 2022-05-05 | Discharge: 2022-05-05 | Disposition: A | Discharge status: Home / Self Care | Payer: Medicaid Other | Source: Ambulatory Visit | Attending: Nephrology | Admitting: Nephrology

## 2022-05-05 DIAGNOSIS — B1920 Unspecified viral hepatitis C without hepatic coma: Secondary | ICD-10-CM | POA: Insufficient documentation

## 2022-05-05 DIAGNOSIS — I129 Hypertensive chronic kidney disease with stage 1 through stage 4 chronic kidney disease, or unspecified chronic kidney disease: Secondary | ICD-10-CM | POA: Insufficient documentation

## 2022-05-05 DIAGNOSIS — N182 Chronic kidney disease, stage 2 (mild): Secondary | ICD-10-CM | POA: Diagnosis not present

## 2022-05-05 DIAGNOSIS — E785 Hyperlipidemia, unspecified: Secondary | ICD-10-CM | POA: Diagnosis present

## 2022-05-07 NOTE — Progress Notes (Signed)
Carelink Summary Report / Loop Recorder 

## 2022-05-26 ENCOUNTER — Ambulatory Visit (INDEPENDENT_AMBULATORY_CARE_PROVIDER_SITE_OTHER): Payer: Medicaid Other

## 2022-05-26 DIAGNOSIS — I639 Cerebral infarction, unspecified: Secondary | ICD-10-CM

## 2022-05-27 LAB — CUP PACEART REMOTE DEVICE CHECK
Date Time Interrogation Session: 20230702231254
Implantable Pulse Generator Implant Date: 20201229

## 2022-06-18 NOTE — Progress Notes (Signed)
Carelink Summary Report / Loop Recorder 

## 2022-06-30 ENCOUNTER — Ambulatory Visit (INDEPENDENT_AMBULATORY_CARE_PROVIDER_SITE_OTHER): Payer: Medicaid Other

## 2022-06-30 DIAGNOSIS — I639 Cerebral infarction, unspecified: Secondary | ICD-10-CM

## 2022-06-30 LAB — CUP PACEART REMOTE DEVICE CHECK
Date Time Interrogation Session: 20230804231517
Implantable Pulse Generator Implant Date: 20201229

## 2022-07-27 ENCOUNTER — Emergency Department (HOSPITAL_COMMUNITY)
Admission: EM | Admit: 2022-07-27 | Discharge: 2022-07-28 | Disposition: A | Discharge status: Home / Self Care | Payer: Medicaid Other | Attending: Emergency Medicine | Admitting: Emergency Medicine

## 2022-07-27 DIAGNOSIS — Z7982 Long term (current) use of aspirin: Secondary | ICD-10-CM | POA: Insufficient documentation

## 2022-07-27 DIAGNOSIS — J45909 Unspecified asthma, uncomplicated: Secondary | ICD-10-CM | POA: Diagnosis not present

## 2022-07-27 DIAGNOSIS — I10 Essential (primary) hypertension: Secondary | ICD-10-CM | POA: Insufficient documentation

## 2022-07-27 DIAGNOSIS — R7989 Other specified abnormal findings of blood chemistry: Secondary | ICD-10-CM | POA: Diagnosis not present

## 2022-07-27 DIAGNOSIS — Z79899 Other long term (current) drug therapy: Secondary | ICD-10-CM | POA: Diagnosis not present

## 2022-07-27 DIAGNOSIS — R531 Weakness: Secondary | ICD-10-CM | POA: Diagnosis not present

## 2022-07-27 LAB — COMPREHENSIVE METABOLIC PANEL
ALT: 13 U/L (ref 0–44)
AST: 17 U/L (ref 15–41)
Albumin: 3.4 g/dL — ABNORMAL LOW (ref 3.5–5.0)
Alkaline Phosphatase: 104 U/L (ref 38–126)
Anion gap: 7 (ref 5–15)
BUN: 14 mg/dL (ref 6–20)
CO2: 23 mmol/L (ref 22–32)
Calcium: 9 mg/dL (ref 8.9–10.3)
Chloride: 110 mmol/L (ref 98–111)
Creatinine, Ser: 1.82 mg/dL — ABNORMAL HIGH (ref 0.61–1.24)
GFR, Estimated: 43 mL/min — ABNORMAL LOW (ref >60)
Glucose, Bld: 86 mg/dL (ref 70–99)
Potassium: 3.6 mmol/L (ref 3.5–5.1)
Sodium: 140 mmol/L (ref 135–145)
Total Bilirubin: 1.2 mg/dL (ref 0.3–1.2)
Total Protein: 7.5 g/dL (ref 6.5–8.1)

## 2022-07-27 LAB — CBC WITH DIFFERENTIAL/PLATELET
Abs Immature Granulocytes: 0.03 10*3/uL (ref 0.00–0.07)
Basophils Absolute: 0 10*3/uL (ref 0.0–0.1)
Basophils Relative: 1 %
Eosinophils Absolute: 0.2 10*3/uL (ref 0.0–0.5)
Eosinophils Relative: 2 %
HCT: 44.5 % (ref 39.0–52.0)
Hemoglobin: 14.7 g/dL (ref 13.0–17.0)
Immature Granulocytes: 0 %
Lymphocytes Relative: 14 %
Lymphs Abs: 1.3 10*3/uL (ref 0.7–4.0)
MCH: 30.2 pg (ref 26.0–34.0)
MCHC: 33 g/dL (ref 30.0–36.0)
MCV: 91.4 fL (ref 80.0–100.0)
Monocytes Absolute: 0.6 10*3/uL (ref 0.1–1.0)
Monocytes Relative: 7 %
Neutro Abs: 6.7 10*3/uL (ref 1.7–7.7)
Neutrophils Relative %: 76 %
Platelets: 241 10*3/uL (ref 150–400)
RBC: 4.87 MIL/uL (ref 4.22–5.81)
RDW: 12.8 % (ref 11.5–15.5)
WBC: 8.8 10*3/uL (ref 4.0–10.5)
nRBC: 0 % (ref 0.0–0.2)

## 2022-07-27 LAB — MAGNESIUM: Magnesium: 2 mg/dL (ref 1.7–2.4)

## 2022-07-27 MED ORDER — LACTATED RINGERS IV BOLUS: 500.0000 mL | Freq: Once | INTRAVENOUS | Status: DC

## 2022-07-27 NOTE — ED Provider Notes (Signed)
[facility] Provider Note   CSN: [id number] Arrival date & time: 07/27/22  1753     History {Add pertinent medical, surgical, social history, OB history to HPI:1} No chief complaint on file.   [facility] is a 56 y.o. male. History of CVA with residual cognitive dysfunction presenting from facility due to reported weakness.  Patient was sitting outside and when the facility member went to check on him he seemed more weak than usual.  So EMS was called.  Patient denies any symptoms on arrival. HPI     Home Medications Prior to Admission medications   Medication Sig Start Date End Date Taking? Authorizing Provider  amLODipine (NORVASC) 5 MG tablet Take 1 tablet (5 mg total) by mouth daily. 08/19/21   Hosie Poisson, MD  aspirin 81 MG EC tablet Take 1 tablet (81 mg total) by mouth daily. 11/24/19   Kathie Dike, MD  atorvastatin (LIPITOR) 40 MG tablet Take 1 tablet (40 mg total) by mouth daily at 6 PM. 11/23/19   Kathie Dike, MD  cyanocobalamin 1000 MCG tablet Take 1 tablet (1,000 mcg total) by mouth daily. 11/24/19   Kathie Dike, MD  ferrous sulfate 325 (65 FE) MG tablet Take 325 mg by mouth daily with breakfast.    [provider]  loratadine (CLARITIN) 10 MG tablet Take 1 tablet (10 mg total) by mouth daily. 08/19/21   Hosie Poisson, MD  oxymetazoline (AFRIN) 0.05 % nasal spray Place 1 spray into both nostrils 2 (two) times daily. 08/19/21   Hosie Poisson, MD  sertraline (ZOLOFT) 50 MG tablet Take 50 mg by mouth daily.    [provider]      Allergies    Sulfa antibiotics    Review of Systems   Review of Systems  Unable to perform ROS: Patient nonverbal    Physical Exam Updated Vital Signs There were no vitals taken for this visit. Physical Exam Vitals and nursing note reviewed.  Constitutional:      General: He is not in acute distress.    Appearance: He is well-developed.  HENT:     Head:  Normocephalic and atraumatic.  Eyes:     Conjunctiva/sclera: Conjunctivae normal.  Cardiovascular:     Rate and Rhythm: Normal rate and regular rhythm.     Heart sounds: No murmur heard. Pulmonary:     Effort: Pulmonary effort is normal. No respiratory distress.     Breath sounds: Normal breath sounds.  Abdominal:     Palpations: Abdomen is soft.     Tenderness: There is no abdominal tenderness.  Musculoskeletal:        General: No swelling.     Cervical back: Neck supple.  Skin:    General: Skin is warm and dry.     Capillary Refill: Capillary refill takes less than 2 seconds.  Neurological:     General: No focal deficit present.     Mental Status: He is alert.     Motor: Weakness (symmetric) present.     Comments: Alert to self only, answers yes or no questions      ED Results / Procedures / Treatments   Labs (all labs ordered are listed, but only abnormal results are displayed) Labs Reviewed - No data to display  EKG None  Radiology No results found.  Procedures Procedures  {Document cardiac monitor, telemetry assessment procedure when appropriate:1}  Medications Ordered in ED Medications - No data to display  ED Course/ Medical Decision  Making/ A&P                           Medical Decision Making Amount and/or Complexity of Data Reviewed Labs: ordered.   56 year old male with past medical history of CVA with cognitive delay, hypertension, s/p loop recorder, asthma presenting via EMS from facility due to reported weakness.  On arrival patient is at his baseline, per chart review.  Differential diagnosis includes dehydration, heat exhaustion, UTI, electrolyte abnormalities, AKI.  Family member arrived to bedside and confirmed the patient is at his baseline.  He is alert to self only.  Labs reviewed and overall reassuring.  Patient does have mildly elevated creatinine to 1.8, from baseline of 1.6.  IV fluid bolus given.  No electrolyte abnormalities,  leukocytosis, or anemia. Urinalysis ***  Overall, suspect patient's presentation today due to dehydration.  Low suspicion for emergent condition at this time.  I discussed this with the patient's family member who felt comfortable with discharge back to facility.  Patient remained stable while in the ED.  {Document critical care time when appropriate:1} {Document review of labs and clinical decision tools ie heart score, Chads2Vasc2 etc:1}  {Document your independent review of radiology images, and any outside records:1} {Document your discussion with family members, caretakers, and with consultants:1} {Document social determinants of health affecting pt's care:1} {Document your decision making why or why not admission, treatments were needed:1} Final Clinical Impression(s) / ED Diagnoses Final diagnoses:  None    Rx / DC Orders ED Discharge Orders     None

## 2022-07-27 NOTE — ED Triage Notes (Signed)
Pt bib [facility] from [place] stating he is "more weak than normal sorta." Staff unable to provide story. Previous hx of CVA, pts deficits are bilateral.   EMS vitals 136/88 BP 96 HR 157 CBG 98 O2

## 2022-07-27 NOTE — ED Notes (Signed)
The pts sister has gone home to feed animals  she will return

## 2022-07-28 LAB — URINALYSIS, ROUTINE W REFLEX MICROSCOPIC
Bilirubin Urine: NEGATIVE
Glucose, UA: NEGATIVE mg/dL
Hgb urine dipstick: NEGATIVE
Ketones, ur: NEGATIVE mg/dL
Leukocytes,Ua: NEGATIVE
Nitrite: NEGATIVE
Protein, ur: 30 mg/dL — AB
Specific Gravity, Urine: 1.024 (ref 1.005–1.030)
pH: 5 (ref 5.0–8.0)

## 2022-07-28 NOTE — Discharge Instructions (Signed)
You had a reassuring work up today. Stay well hydrated, especially in the heat. You do not have a urinary infection. Follow up with your primary doctor.

## 2022-07-30 LAB — CUP PACEART REMOTE DEVICE CHECK
Date Time Interrogation Session: 20230906230954
Implantable Pulse Generator Implant Date: 20201229

## 2022-07-31 NOTE — Progress Notes (Signed)
Carelink Summary Report / Loop Recorder 

## 2022-08-04 ENCOUNTER — Ambulatory Visit (INDEPENDENT_AMBULATORY_CARE_PROVIDER_SITE_OTHER): Payer: Medicaid Other

## 2022-08-04 DIAGNOSIS — I639 Cerebral infarction, unspecified: Secondary | ICD-10-CM | POA: Diagnosis not present

## 2022-08-21 NOTE — Progress Notes (Signed)
Carelink Summary Report / Loop Recorder 

## 2022-09-04 LAB — CUP PACEART REMOTE DEVICE CHECK
Date Time Interrogation Session: 20231009230921
Implantable Pulse Generator Implant Date: 20201229

## 2022-09-08 ENCOUNTER — Ambulatory Visit (INDEPENDENT_AMBULATORY_CARE_PROVIDER_SITE_OTHER): Payer: Medicaid Other

## 2022-09-08 DIAGNOSIS — I639 Cerebral infarction, unspecified: Secondary | ICD-10-CM | POA: Diagnosis not present

## 2022-10-01 NOTE — Progress Notes (Signed)
Carelink Summary Report / Loop Recorder 

## 2022-10-13 ENCOUNTER — Ambulatory Visit (INDEPENDENT_AMBULATORY_CARE_PROVIDER_SITE_OTHER): Payer: Medicaid Other

## 2022-10-13 DIAGNOSIS — I639 Cerebral infarction, unspecified: Secondary | ICD-10-CM | POA: Diagnosis not present

## 2022-10-14 LAB — CUP PACEART REMOTE DEVICE CHECK
Date Time Interrogation Session: 20231119230940
Implantable Pulse Generator Implant Date: 20201229

## 2022-11-05 ENCOUNTER — Other Ambulatory Visit (HOSPITAL_COMMUNITY): Payer: Self-pay

## 2022-11-18 ENCOUNTER — Ambulatory Visit (INDEPENDENT_AMBULATORY_CARE_PROVIDER_SITE_OTHER): Payer: Medicaid Other

## 2022-11-18 DIAGNOSIS — I639 Cerebral infarction, unspecified: Secondary | ICD-10-CM | POA: Diagnosis not present

## 2022-11-18 LAB — CUP PACEART REMOTE DEVICE CHECK
Date Time Interrogation Session: 20231225231108
Implantable Pulse Generator Implant Date: 20201229

## 2022-11-21 NOTE — Progress Notes (Signed)
Carelink Summary Report / Loop Recorder 

## 2022-12-12 NOTE — Progress Notes (Signed)
Carelink Summary Report / Loop Recorder 

## 2022-12-22 ENCOUNTER — Ambulatory Visit: Payer: Medicaid Other

## 2022-12-22 DIAGNOSIS — I639 Cerebral infarction, unspecified: Secondary | ICD-10-CM | POA: Diagnosis not present

## 2022-12-23 LAB — CUP PACEART REMOTE DEVICE CHECK
Date Time Interrogation Session: 20240127230520
Implantable Pulse Generator Implant Date: 20201229

## 2023-01-26 ENCOUNTER — Ambulatory Visit (INDEPENDENT_AMBULATORY_CARE_PROVIDER_SITE_OTHER): Payer: Medicare Other

## 2023-01-26 DIAGNOSIS — I639 Cerebral infarction, unspecified: Secondary | ICD-10-CM | POA: Diagnosis not present

## 2023-01-27 LAB — CUP PACEART REMOTE DEVICE CHECK
Date Time Interrogation Session: 20240229230431
Implantable Pulse Generator Implant Date: 20201229

## 2023-02-05 NOTE — Progress Notes (Signed)
Carelink Summary Report / Loop Recorder 

## 2023-03-02 ENCOUNTER — Ambulatory Visit (INDEPENDENT_AMBULATORY_CARE_PROVIDER_SITE_OTHER): Payer: Medicare Other

## 2023-03-02 DIAGNOSIS — I639 Cerebral infarction, unspecified: Secondary | ICD-10-CM

## 2023-03-02 LAB — CUP PACEART REMOTE DEVICE CHECK
Date Time Interrogation Session: 20240407230914
Implantable Pulse Generator Implant Date: 20201229

## 2023-03-06 NOTE — Progress Notes (Signed)
Carelink Summary Report / Loop Recorder 

## 2023-04-06 ENCOUNTER — Ambulatory Visit (INDEPENDENT_AMBULATORY_CARE_PROVIDER_SITE_OTHER): Payer: Medicare Other

## 2023-04-06 DIAGNOSIS — I639 Cerebral infarction, unspecified: Secondary | ICD-10-CM | POA: Diagnosis not present

## 2023-04-06 LAB — CUP PACEART REMOTE DEVICE CHECK
Date Time Interrogation Session: 20240510230456
Implantable Pulse Generator Implant Date: 20201229

## 2023-04-08 NOTE — Progress Notes (Signed)
Carelink Summary Report / Loop Recorder 

## 2023-05-04 NOTE — Progress Notes (Signed)
Carelink Summary Report / Loop Recorder 

## 2023-05-11 ENCOUNTER — Ambulatory Visit (INDEPENDENT_AMBULATORY_CARE_PROVIDER_SITE_OTHER): Payer: Medicare Other

## 2023-05-11 DIAGNOSIS — I639 Cerebral infarction, unspecified: Secondary | ICD-10-CM | POA: Diagnosis not present

## 2023-05-11 LAB — CUP PACEART REMOTE DEVICE CHECK
Date Time Interrogation Session: 20240616230631
Implantable Pulse Generator Implant Date: 20201229

## 2023-06-01 NOTE — Progress Notes (Signed)
Carelink Summary Report / Loop Recorder 

## 2023-06-15 ENCOUNTER — Ambulatory Visit: Payer: Medicare Other

## 2023-06-15 DIAGNOSIS — I639 Cerebral infarction, unspecified: Secondary | ICD-10-CM

## 2023-06-16 LAB — CUP PACEART REMOTE DEVICE CHECK
Date Time Interrogation Session: 20240719230308
Implantable Pulse Generator Implant Date: 20201229

## 2023-07-03 NOTE — Progress Notes (Signed)
Carelink Summary Report / Loop Recorder 

## 2023-07-16 LAB — CUP PACEART REMOTE DEVICE CHECK
Date Time Interrogation Session: 20240821230731
Implantable Pulse Generator Implant Date: 20201229

## 2023-07-20 ENCOUNTER — Ambulatory Visit: Payer: Medicare Other | Attending: Cardiovascular Disease

## 2023-07-20 DIAGNOSIS — I639 Cerebral infarction, unspecified: Secondary | ICD-10-CM

## 2023-07-29 NOTE — Progress Notes (Signed)
Carelink Summary Report / Loop Recorder 

## 2023-08-19 LAB — CUP PACEART REMOTE DEVICE CHECK
Date Time Interrogation Session: 20240923230337
Implantable Pulse Generator Implant Date: 20201229

## 2023-08-24 ENCOUNTER — Ambulatory Visit (INDEPENDENT_AMBULATORY_CARE_PROVIDER_SITE_OTHER): Payer: Medicare Other

## 2023-08-24 DIAGNOSIS — I639 Cerebral infarction, unspecified: Secondary | ICD-10-CM

## 2023-09-08 NOTE — Progress Notes (Signed)
Carelink Summary Report / Loop Recorder 

## 2023-09-22 LAB — CUP PACEART REMOTE DEVICE CHECK
Date Time Interrogation Session: 20241026230140
Implantable Pulse Generator Implant Date: 20201229

## 2023-09-28 ENCOUNTER — Ambulatory Visit (INDEPENDENT_AMBULATORY_CARE_PROVIDER_SITE_OTHER): Payer: Medicare Other

## 2023-09-28 DIAGNOSIS — I639 Cerebral infarction, unspecified: Secondary | ICD-10-CM

## 2023-10-26 NOTE — Progress Notes (Signed)
Carelink Summary Report / Loop Recorder 

## 2023-11-02 ENCOUNTER — Ambulatory Visit: Payer: Medicare Other

## 2023-11-02 DIAGNOSIS — I639 Cerebral infarction, unspecified: Secondary | ICD-10-CM | POA: Diagnosis not present

## 2023-11-02 LAB — CUP PACEART REMOTE DEVICE CHECK
Date Time Interrogation Session: 20241208230252
Implantable Pulse Generator Implant Date: 20201229

## 2023-12-07 ENCOUNTER — Ambulatory Visit (INDEPENDENT_AMBULATORY_CARE_PROVIDER_SITE_OTHER): Payer: Medicare Other

## 2023-12-07 DIAGNOSIS — I639 Cerebral infarction, unspecified: Secondary | ICD-10-CM

## 2023-12-07 LAB — CUP PACEART REMOTE DEVICE CHECK
Date Time Interrogation Session: 20250112230209
Implantable Pulse Generator Implant Date: 20201229

## 2023-12-10 NOTE — Progress Notes (Signed)
Carelink Summary Report / Loop Recorder 

## 2023-12-19 ENCOUNTER — Encounter: Payer: Self-pay | Admitting: Cardiovascular Disease

## 2024-01-11 ENCOUNTER — Ambulatory Visit: Payer: Medicare Other

## 2024-01-11 DIAGNOSIS — I639 Cerebral infarction, unspecified: Secondary | ICD-10-CM

## 2024-01-12 LAB — CUP PACEART REMOTE DEVICE CHECK
Date Time Interrogation Session: 20250216230124
Implantable Pulse Generator Implant Date: 20201229

## 2024-01-19 NOTE — Progress Notes (Signed)
 [facility] Summary Report / Loop Recorder

## 2024-01-24 ENCOUNTER — Encounter: Payer: Self-pay | Admitting: Cardiovascular Disease

## 2024-02-15 ENCOUNTER — Ambulatory Visit (INDEPENDENT_AMBULATORY_CARE_PROVIDER_SITE_OTHER): Payer: Medicare Other

## 2024-02-15 DIAGNOSIS — I639 Cerebral infarction, unspecified: Secondary | ICD-10-CM | POA: Diagnosis not present

## 2024-02-15 LAB — CUP PACEART REMOTE DEVICE CHECK
Date Time Interrogation Session: 20250323230112
Implantable Pulse Generator Implant Date: 20201229

## 2024-02-19 NOTE — Progress Notes (Signed)
 [facility] Summary Report / Loop Recorder

## 2024-02-19 NOTE — Addendum Note (Signed)
 Addended by: Geralyn Flash D on: 02/19/2024 12:35 PM   Modules accepted: Orders

## 2024-02-25 ENCOUNTER — Encounter: Payer: Self-pay | Admitting: Cardiovascular Disease

## 2024-03-21 ENCOUNTER — Ambulatory Visit (INDEPENDENT_AMBULATORY_CARE_PROVIDER_SITE_OTHER): Payer: Medicare Other

## 2024-03-21 DIAGNOSIS — I639 Cerebral infarction, unspecified: Secondary | ICD-10-CM

## 2024-03-21 LAB — CUP PACEART REMOTE DEVICE CHECK
Date Time Interrogation Session: 20250427230300
Implantable Pulse Generator Implant Date: 20201229

## 2024-04-04 ENCOUNTER — Encounter: Payer: Self-pay | Admitting: Cardiovascular Disease

## 2024-04-05 NOTE — Progress Notes (Signed)
 [facility] Summary Report / Loop Recorder

## 2024-04-21 ENCOUNTER — Ambulatory Visit

## 2024-04-21 DIAGNOSIS — I639 Cerebral infarction, unspecified: Secondary | ICD-10-CM

## 2024-04-21 LAB — CUP PACEART REMOTE DEVICE CHECK
Date Time Interrogation Session: 20250528230052
Implantable Pulse Generator Implant Date: 20201229

## 2024-04-27 ENCOUNTER — Ambulatory Visit: Payer: Self-pay | Admitting: Cardiovascular Disease

## 2024-05-11 NOTE — Progress Notes (Signed)
 [facility] Summary Report / Loop Recorder

## 2024-05-23 ENCOUNTER — Ambulatory Visit

## 2024-05-23 DIAGNOSIS — I639 Cerebral infarction, unspecified: Secondary | ICD-10-CM | POA: Diagnosis not present

## 2024-05-23 LAB — CUP PACEART REMOTE DEVICE CHECK
Date Time Interrogation Session: 20250629230330
Implantable Pulse Generator Implant Date: 20201229

## 2024-05-29 ENCOUNTER — Ambulatory Visit: Payer: Self-pay | Admitting: Cardiovascular Disease

## 2024-06-10 NOTE — Progress Notes (Signed)
 [facility] Summary Report / Loop Recorder

## 2024-06-23 ENCOUNTER — Ambulatory Visit (INDEPENDENT_AMBULATORY_CARE_PROVIDER_SITE_OTHER)

## 2024-06-23 DIAGNOSIS — I639 Cerebral infarction, unspecified: Secondary | ICD-10-CM

## 2024-06-23 LAB — CUP PACEART REMOTE DEVICE CHECK
Date Time Interrogation Session: 20250730230614
Implantable Pulse Generator Implant Date: 20201229

## 2024-07-05 ENCOUNTER — Ambulatory Visit: Payer: Self-pay | Admitting: Cardiovascular Disease

## 2024-07-25 ENCOUNTER — Ambulatory Visit

## 2024-07-25 DIAGNOSIS — I639 Cerebral infarction, unspecified: Secondary | ICD-10-CM

## 2024-07-27 LAB — CUP PACEART REMOTE DEVICE CHECK
Date Time Interrogation Session: 20250830230312
Implantable Pulse Generator Implant Date: 20201229

## 2024-07-28 ENCOUNTER — Ambulatory Visit: Payer: Self-pay | Admitting: Cardiovascular Disease

## 2024-08-02 NOTE — Progress Notes (Signed)
 Remote Loop Recorder Transmission

## 2024-08-23 NOTE — Progress Notes (Signed)
 Remote Loop Recorder Transmission

## 2024-08-24 NOTE — Progress Notes (Signed)
 Remote Loop Recorder Transmission

## 2024-08-25 ENCOUNTER — Ambulatory Visit

## 2024-08-25 DIAGNOSIS — I639 Cerebral infarction, unspecified: Secondary | ICD-10-CM

## 2024-08-25 LAB — CUP PACEART REMOTE DEVICE CHECK
Date Time Interrogation Session: 20251001230229
Implantable Pulse Generator Implant Date: 20201229

## 2024-08-25 NOTE — Progress Notes (Signed)
 Remote Loop Recorder Transmission

## 2024-09-05 ENCOUNTER — Ambulatory Visit: Payer: Self-pay | Admitting: Cardiovascular Disease

## 2024-09-26 ENCOUNTER — Ambulatory Visit (INDEPENDENT_AMBULATORY_CARE_PROVIDER_SITE_OTHER)

## 2024-09-26 DIAGNOSIS — I639 Cerebral infarction, unspecified: Secondary | ICD-10-CM

## 2024-09-26 LAB — CUP PACEART REMOTE DEVICE CHECK
Date Time Interrogation Session: 20251102230802
Implantable Pulse Generator Implant Date: 20201229

## 2024-09-28 NOTE — Progress Notes (Signed)
 Remote Loop Recorder Transmission

## 2024-09-29 ENCOUNTER — Ambulatory Visit: Payer: Self-pay | Admitting: Cardiovascular Disease

## 2024-10-27 ENCOUNTER — Encounter

## 2024-10-28 ENCOUNTER — Ambulatory Visit

## 2024-10-28 DIAGNOSIS — I639 Cerebral infarction, unspecified: Secondary | ICD-10-CM

## 2024-10-28 LAB — CUP PACEART REMOTE DEVICE CHECK
Date Time Interrogation Session: 20251204230917
Implantable Pulse Generator Implant Date: 20201229

## 2024-11-01 NOTE — Progress Notes (Signed)
 Remote Loop Recorder Transmission

## 2024-11-10 ENCOUNTER — Ambulatory Visit: Payer: Self-pay | Admitting: Cardiovascular Disease

## 2024-11-28 ENCOUNTER — Ambulatory Visit

## 2024-11-28 ENCOUNTER — Encounter

## 2024-11-28 DIAGNOSIS — I639 Cerebral infarction, unspecified: Secondary | ICD-10-CM

## 2024-11-28 LAB — CUP PACEART REMOTE DEVICE CHECK
Date Time Interrogation Session: 20260104230224
Implantable Pulse Generator Implant Date: 20201229

## 2024-12-01 NOTE — Progress Notes (Signed)
 Remote Loop Recorder Transmission

## 2024-12-03 ENCOUNTER — Ambulatory Visit: Payer: Self-pay | Admitting: Cardiovascular Disease

## 2024-12-29 ENCOUNTER — Encounter

## 2024-12-29 ENCOUNTER — Ambulatory Visit

## 2024-12-29 LAB — CUP PACEART REMOTE DEVICE CHECK
Date Time Interrogation Session: 20260204230534
Implantable Pulse Generator Implant Date: 20201229

## 2025-01-29 ENCOUNTER — Ambulatory Visit

## 2025-03-01 ENCOUNTER — Ambulatory Visit

## 2025-04-01 ENCOUNTER — Ambulatory Visit

## 2025-05-02 ENCOUNTER — Ambulatory Visit

## 2025-06-02 ENCOUNTER — Ambulatory Visit

## 2025-07-03 ENCOUNTER — Ambulatory Visit

## 2025-08-03 ENCOUNTER — Ambulatory Visit

## 2025-09-03 ENCOUNTER — Ambulatory Visit

## 2025-10-04 ENCOUNTER — Ambulatory Visit

## 2025-11-04 ENCOUNTER — Ambulatory Visit

## 2025-12-05 ENCOUNTER — Ambulatory Visit
